# Patient Record
Sex: Female | Born: 1945 | Race: White | Hispanic: No | Smoking: Former smoker
Health system: Southern US, Community
[De-identification: ages and names within clinical notes are randomized; demographics above are authoritative.]

## PROBLEM LIST (undated history)

## (undated) DIAGNOSIS — I2510 Atherosclerotic heart disease of native coronary artery without angina pectoris: Secondary | ICD-10-CM

## (undated) DIAGNOSIS — J432 Centrilobular emphysema: Secondary | ICD-10-CM

## (undated) DIAGNOSIS — K760 Fatty (change of) liver, not elsewhere classified: Secondary | ICD-10-CM

## (undated) DIAGNOSIS — Z9889 Other specified postprocedural states: Secondary | ICD-10-CM

## (undated) DIAGNOSIS — K219 Gastro-esophageal reflux disease without esophagitis: Secondary | ICD-10-CM

## (undated) DIAGNOSIS — M359 Systemic involvement of connective tissue, unspecified: Secondary | ICD-10-CM

## (undated) DIAGNOSIS — I779 Disorder of arteries and arterioles, unspecified: Secondary | ICD-10-CM

## (undated) DIAGNOSIS — T7840XA Allergy, unspecified, initial encounter: Secondary | ICD-10-CM

## (undated) DIAGNOSIS — Z87891 Personal history of nicotine dependence: Principal | ICD-10-CM

## (undated) DIAGNOSIS — I4891 Unspecified atrial fibrillation: Secondary | ICD-10-CM

## (undated) DIAGNOSIS — R030 Elevated blood-pressure reading, without diagnosis of hypertension: Secondary | ICD-10-CM

## (undated) DIAGNOSIS — E785 Hyperlipidemia, unspecified: Secondary | ICD-10-CM

## (undated) DIAGNOSIS — I739 Peripheral vascular disease, unspecified: Secondary | ICD-10-CM

## (undated) DIAGNOSIS — I499 Cardiac arrhythmia, unspecified: Secondary | ICD-10-CM

## (undated) DIAGNOSIS — R112 Nausea with vomiting, unspecified: Secondary | ICD-10-CM

## (undated) DIAGNOSIS — C44311 Basal cell carcinoma of skin of nose: Secondary | ICD-10-CM

## (undated) DIAGNOSIS — J449 Chronic obstructive pulmonary disease, unspecified: Secondary | ICD-10-CM

## (undated) HISTORY — DX: Personal history of nicotine dependence: Z87.891

## (undated) HISTORY — DX: Fatty (change of) liver, not elsewhere classified: K76.0

## (undated) HISTORY — DX: Centrilobular emphysema: J43.2

## (undated) HISTORY — DX: Peripheral vascular disease, unspecified: I73.9

## (undated) HISTORY — DX: Elevated blood-pressure reading, without diagnosis of hypertension: R03.0

## (undated) HISTORY — DX: Disorder of arteries and arterioles, unspecified: I77.9

## (undated) HISTORY — DX: Atherosclerotic heart disease of native coronary artery without angina pectoris: I25.10

## (undated) HISTORY — DX: Allergy, unspecified, initial encounter: T78.40XA

## (undated) HISTORY — PX: TUBAL LIGATION: SHX77

## (undated) HISTORY — DX: Hyperlipidemia, unspecified: E78.5

## (undated) HISTORY — PX: TONSILLECTOMY: SUR1361

## (undated) HISTORY — DX: Unspecified atrial fibrillation: I48.91

---
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
## 2004-07-26 ENCOUNTER — Emergency Department: Payer: Self-pay | Admitting: General Practice

## 2006-09-12 ENCOUNTER — Emergency Department: Payer: Self-pay | Admitting: Emergency Medicine

## 2006-10-21 ENCOUNTER — Ambulatory Visit: Payer: Self-pay | Admitting: Specialist

## 2009-07-08 ENCOUNTER — Emergency Department: Payer: Self-pay | Admitting: Emergency Medicine

## 2011-03-04 ENCOUNTER — Ambulatory Visit: Payer: Self-pay | Admitting: Internal Medicine

## 2011-04-16 ENCOUNTER — Emergency Department: Payer: Self-pay | Admitting: Emergency Medicine

## 2011-08-02 ENCOUNTER — Emergency Department: Payer: Self-pay | Admitting: Emergency Medicine

## 2011-09-17 ENCOUNTER — Ambulatory Visit: Payer: Self-pay | Admitting: Vascular Surgery

## 2012-05-07 ENCOUNTER — Ambulatory Visit: Payer: Self-pay | Admitting: Family Medicine

## 2012-06-23 ENCOUNTER — Ambulatory Visit: Payer: Self-pay | Admitting: Gastroenterology

## 2012-08-13 DIAGNOSIS — Z85828 Personal history of other malignant neoplasm of skin: Secondary | ICD-10-CM | POA: Insufficient documentation

## 2012-09-24 ENCOUNTER — Other Ambulatory Visit: Payer: Self-pay | Admitting: Physician Assistant

## 2012-09-24 ENCOUNTER — Ambulatory Visit: Payer: Self-pay | Admitting: Vascular Surgery

## 2012-09-24 LAB — HEPATIC FUNCTION PANEL A (ARMC)
Alkaline Phosphatase: 215 U/L — ABNORMAL HIGH (ref 50–136)
Bilirubin,Total: 0.5 mg/dL (ref 0.2–1.0)
SGOT(AST): 21 U/L (ref 15–37)
Total Protein: 7.2 g/dL (ref 6.4–8.2)

## 2013-06-23 ENCOUNTER — Emergency Department: Payer: Self-pay | Admitting: Emergency Medicine

## 2013-06-24 LAB — CBC
HCT: 44.1 % (ref 35.0–47.0)
HGB: 14.6 g/dL (ref 12.0–16.0)
MCH: 29.1 pg (ref 26.0–34.0)
MCHC: 33 g/dL (ref 32.0–36.0)
MCV: 88 fL (ref 80–100)
Platelet: 253 10*3/uL (ref 150–440)
RBC: 5.01 10*6/uL (ref 3.80–5.20)
RDW: 13.1 % (ref 11.5–14.5)
WBC: 10.5 10*3/uL (ref 3.6–11.0)

## 2013-06-24 LAB — COMPREHENSIVE METABOLIC PANEL
ALK PHOS: 198 U/L — AB
ALT: 32 U/L (ref 12–78)
AST: 22 U/L (ref 15–37)
Albumin: 4 g/dL (ref 3.4–5.0)
Anion Gap: 8 (ref 7–16)
BUN: 15 mg/dL (ref 7–18)
Bilirubin,Total: 0.5 mg/dL (ref 0.2–1.0)
CHLORIDE: 105 mmol/L (ref 98–107)
CO2: 24 mmol/L (ref 21–32)
CREATININE: 0.87 mg/dL (ref 0.60–1.30)
Calcium, Total: 9.8 mg/dL (ref 8.5–10.1)
EGFR (Non-African Amer.): >60
GLUCOSE: 142 mg/dL — AB (ref 65–99)
OSMOLALITY: 277 (ref 275–301)
Potassium: 4.2 mmol/L (ref 3.5–5.1)
SODIUM: 137 mmol/L (ref 136–145)
TOTAL PROTEIN: 8.4 g/dL — AB (ref 6.4–8.2)

## 2014-02-20 ENCOUNTER — Emergency Department: Payer: Self-pay | Admitting: Emergency Medicine

## 2014-02-20 LAB — CBC
HCT: 43 % (ref 35.0–47.0)
HGB: 14.4 g/dL (ref 12.0–16.0)
MCH: 29.6 pg (ref 26.0–34.0)
MCHC: 33.4 g/dL (ref 32.0–36.0)
MCV: 89 fL (ref 80–100)
Platelet: 218 10*3/uL (ref 150–440)
RBC: 4.85 10*6/uL (ref 3.80–5.20)
RDW: 13.3 % (ref 11.5–14.5)
WBC: 7 10*3/uL (ref 3.6–11.0)

## 2014-02-20 LAB — URINALYSIS, COMPLETE
Bacteria: NONE SEEN
Bilirubin,UR: NEGATIVE
Blood: NEGATIVE
Glucose,UR: NEGATIVE mg/dL (ref 0–75)
KETONE: NEGATIVE
Leukocyte Esterase: NEGATIVE
Nitrite: NEGATIVE
PROTEIN: NEGATIVE
Ph: 6 (ref 4.5–8.0)
Specific Gravity: 1.004 (ref 1.003–1.030)
Squamous Epithelial: <1

## 2014-02-20 LAB — PROTIME-INR
INR: 1
PROTHROMBIN TIME: 13 s (ref 11.5–14.7)

## 2014-02-20 LAB — BASIC METABOLIC PANEL
Anion Gap: 4 — ABNORMAL LOW (ref 7–16)
BUN: 11 mg/dL (ref 7–18)
CHLORIDE: 106 mmol/L (ref 98–107)
CREATININE: 0.95 mg/dL (ref 0.60–1.30)
Calcium, Total: 9.2 mg/dL (ref 8.5–10.1)
Co2: 31 mmol/L (ref 21–32)
EGFR (African American): >60
EGFR (Non-African Amer.): >60
Glucose: 92 mg/dL (ref 65–99)
Osmolality: 280 (ref 275–301)
POTASSIUM: 4 mmol/L (ref 3.5–5.1)
SODIUM: 141 mmol/L (ref 136–145)

## 2014-02-20 LAB — LIPASE, BLOOD: Lipase: 122 U/L (ref 73–393)

## 2014-06-30 ENCOUNTER — Other Ambulatory Visit: Payer: Self-pay | Admitting: Vascular Surgery

## 2014-06-30 DIAGNOSIS — I6523 Occlusion and stenosis of bilateral carotid arteries: Secondary | ICD-10-CM

## 2014-07-08 ENCOUNTER — Ambulatory Visit
Admission: RE | Admit: 2014-07-08 | Discharge: 2014-07-08 | Disposition: A | Discharge status: Home / Self Care | Payer: Medicare Other | Source: Ambulatory Visit | Attending: Vascular Surgery | Admitting: Vascular Surgery

## 2014-07-08 DIAGNOSIS — I6522 Occlusion and stenosis of left carotid artery: Secondary | ICD-10-CM | POA: Insufficient documentation

## 2014-07-08 DIAGNOSIS — I6523 Occlusion and stenosis of bilateral carotid arteries: Secondary | ICD-10-CM

## 2014-07-08 HISTORY — DX: Systemic involvement of connective tissue, unspecified: M35.9

## 2014-07-08 MED ORDER — IOHEXOL 350 MG/ML SOLN
80.0000 mL | Freq: Once | INTRAVENOUS | Status: AC | PRN
  Administered 2014-07-08: 100 mL via INTRAVENOUS

## 2014-07-11 NOTE — Progress Notes (Signed)
Diagnosis is carotid stenosis.  This is a response from a coding query

## 2014-07-20 ENCOUNTER — Encounter: Payer: Self-pay | Admitting: *Deleted

## 2014-07-20 ENCOUNTER — Encounter
Admission: RE | Disposition: A | Discharge status: Home / Self Care | Payer: Self-pay | Source: Ambulatory Visit | Attending: Vascular Surgery

## 2014-07-20 ENCOUNTER — Ambulatory Visit
Admission: RE | Admit: 2014-07-20 | Discharge: 2014-07-20 | Disposition: A | Discharge status: Home / Self Care | Payer: Medicare Other | Source: Ambulatory Visit | Attending: Vascular Surgery | Admitting: Vascular Surgery

## 2014-07-20 DIAGNOSIS — Z79899 Other long term (current) drug therapy: Secondary | ICD-10-CM | POA: Insufficient documentation

## 2014-07-20 DIAGNOSIS — I6529 Occlusion and stenosis of unspecified carotid artery: Secondary | ICD-10-CM | POA: Diagnosis present

## 2014-07-20 DIAGNOSIS — Z7982 Long term (current) use of aspirin: Secondary | ICD-10-CM | POA: Diagnosis not present

## 2014-07-20 DIAGNOSIS — M79609 Pain in unspecified limb: Secondary | ICD-10-CM | POA: Insufficient documentation

## 2014-07-20 DIAGNOSIS — F17200 Nicotine dependence, unspecified, uncomplicated: Secondary | ICD-10-CM | POA: Diagnosis not present

## 2014-07-20 DIAGNOSIS — J449 Chronic obstructive pulmonary disease, unspecified: Secondary | ICD-10-CM | POA: Diagnosis not present

## 2014-07-20 DIAGNOSIS — E785 Hyperlipidemia, unspecified: Secondary | ICD-10-CM | POA: Diagnosis not present

## 2014-07-20 DIAGNOSIS — I10 Essential (primary) hypertension: Secondary | ICD-10-CM | POA: Insufficient documentation

## 2014-07-20 HISTORY — PX: PERIPHERAL VASCULAR CATHETERIZATION: SHX172C

## 2014-07-20 HISTORY — DX: Chronic obstructive pulmonary disease, unspecified: J44.9

## 2014-07-20 LAB — CREATININE, SERUM: Creatinine, Ser: 0.87 mg/dL (ref 0.44–1.00)

## 2014-07-20 LAB — BUN: BUN: 14 mg/dL (ref 6–20)

## 2014-07-20 SURGERY — CAROTID ANGIOGRAPHY
Anesthesia: Moderate Sedation

## 2014-07-20 MED ORDER — MIDAZOLAM HCL 5 MG/5ML IJ SOLN
INTRAMUSCULAR | Status: AC
  Filled 2014-07-20: qty 5

## 2014-07-20 MED ORDER — HEPARIN SODIUM (PORCINE) 1000 UNIT/ML IJ SOLN
INTRAMUSCULAR | Status: DC | PRN
  Administered 2014-07-20: 3000 [IU] via INTRAVENOUS

## 2014-07-20 MED ORDER — FENTANYL CITRATE (PF) 100 MCG/2ML IJ SOLN
INTRAMUSCULAR | Status: DC | PRN
  Administered 2014-07-20 (×2): 50 ug via INTRAVENOUS

## 2014-07-20 MED ORDER — HEPARIN SODIUM (PORCINE) 1000 UNIT/ML IJ SOLN
INTRAMUSCULAR | Status: AC
  Filled 2014-07-20: qty 1

## 2014-07-20 MED ORDER — FENTANYL CITRATE (PF) 100 MCG/2ML IJ SOLN
INTRAMUSCULAR | Status: AC
  Filled 2014-07-20: qty 2

## 2014-07-20 MED ORDER — LIDOCAINE-EPINEPHRINE (PF) 1 %-1:200000 IJ SOLN
INTRAMUSCULAR | Status: AC
  Filled 2014-07-20: qty 30

## 2014-07-20 MED ORDER — IOHEXOL 350 MG/ML SOLN
INTRAVENOUS | Status: DC | PRN
Start: 2014-07-20 — End: 2014-07-20
  Administered 2014-07-20: 55 mL via INTRA_ARTERIAL

## 2014-07-20 MED ORDER — MIDAZOLAM HCL 2 MG/2ML IJ SOLN
INTRAMUSCULAR | Status: DC | PRN
  Administered 2014-07-20: 2 mg via INTRAVENOUS
  Administered 2014-07-20: 1 mg via INTRAVENOUS

## 2014-07-20 MED ORDER — ONDANSETRON HCL 4 MG/2ML IJ SOLN
INTRAMUSCULAR | Status: AC
  Administered 2014-07-20: 4 mg
  Filled 2014-07-20: qty 2

## 2014-07-20 MED ORDER — SODIUM CHLORIDE 0.9 % IV SOLN
INTRAVENOUS | Status: DC
  Administered 2014-07-20: 75 mL/h via INTRAVENOUS

## 2014-07-20 MED ORDER — HEPARIN (PORCINE) IN NACL 2-0.9 UNIT/ML-% IJ SOLN
INTRAMUSCULAR | Status: AC
  Filled 2014-07-20: qty 500

## 2014-07-20 MED ORDER — CEFAZOLIN SODIUM 1-5 GM-% IV SOLN
1.0000 g | Freq: Once | INTRAVENOUS | Status: AC
  Administered 2014-07-20: 1 g via INTRAVENOUS

## 2014-07-20 MED ORDER — LIDOCAINE-EPINEPHRINE (PF) 1 %-1:200000 IJ SOLN
INTRAMUSCULAR | Status: DC | PRN
  Administered 2014-07-20: 10 mL via INTRADERMAL

## 2014-07-20 SURGICAL SUPPLY — 11 items
CATH ANGIO PIGTAIL 5FR 100 (CATHETERS) ×1 IMPLANT
CATH BEACON 5 .035 100 JB2 TIP (CATHETERS) ×3 IMPLANT
CATH H1 5.0X100 SLIP (CATHETERS) ×2
CATH PIG 5.0X100 (CATHETERS) ×3
CATH SLIP .038X100 H1 (CATHETERS) ×1 IMPLANT
DEVICE STARCLOSE SE CLOSURE (Vascular Products) ×3 IMPLANT
DEVICE TORQUE (MISCELLANEOUS) ×3 IMPLANT
GLIDEWIRE STIFF .35X180X3 HYDR (WIRE) ×3 IMPLANT
PACK ANGIOGRAPHY (CUSTOM PROCEDURE TRAY) ×3 IMPLANT
SHEATH BRITE TIP 5FRX11 (SHEATH) ×3 IMPLANT
WIRE J 3MM .035X145CM (WIRE) ×3 IMPLANT

## 2014-07-20 NOTE — H&P (Signed)
[facility] History & Physical Update  The patient was interviewed and re-examined.  The patient's previous History and Physical has been reviewed and is unchanged.  There is no change in the plan of care.  DEW,JASON, MD  07/20/2014, 12:09 PM

## 2014-07-20 NOTE — Discharge Instructions (Signed)

## 2014-07-20 NOTE — CV Procedure (Signed)
[facility]  Percutaneous Study/Intervention Procedural Note   Date of Surgery: 07/20/2014 Surgeon(s):Noelly Lasseigne  Assistants: Pre-operative Diagnosis: Carotid artery stenosis with differing findings between duplex and CT scan. Post-operative diagnosis:  Same  Procedure(s) Performed:  1.  Ultrasound guidance for vascular access right femoral artery  2.  Catheter placement into right common carotid artery and into left common carotid artery from right femoral approach  3.  Thoracic aortogram and cervical and cerebral carotid angiograms bilaterally  4.  StarClose closure device right femoral artery   EBL: Minimal  Indications:  Patient is a 69 year old white female with carotid artery stenosis. The patient had highly disparate findings between duplex which suggested high-grade carotid artery stenosis and CT angiogram which suggested by the official radiology report minimal carotid artery stenosis. My independent reading of the CT angiogram suggested at least moderate carotid artery stenosis on the left. Given the differing findings and the differing approaches for therapy that would be given with each finding, carotid angiogram is performed for further evaluation.  Procedure:  The patient was identified and appropriate procedural time out was performed.  The patient was then placed supine on the table and prepped and draped in the usual sterile fashion.  Ultrasound was used to evaluate the right common femoral artery.  It was patent .  A digital ultrasound image was acquired.  A Seldinger needle was used to access the right common femoral artery under direct ultrasound guidance and a permanent image was performed.  A 0.035 J wire was advanced without resistance and a 5Fr sheath was placed.  The patient was then given 3000 units of intravenous heparin. Pigtail catheter was placed into the ascending aorta and a thoracic aortogram was performed. This demonstrated normal origins of  the great vessels without proximal stenoses. This was a type II arch. A headhunter catheter was then used to selectively cannulate the innominate artery and advanced into the right common carotid artery. Cervical and cerebral carotid angiography was then performed. This demonstrated normal intracranial filling with brisk cross filling right to left through the anterior cerebral artery. The cervical carotid artery had minimal plaque and less than 10% stenosis. I then turned my attention back to the thoracic aorta. I exchanged for a JB2 catheter and selectively cannulated the left common carotid artery. I advanced the catheter into the mid left common carotid artery and cervical and cerebral angiography were performed. The intracranial filling demonstrated sluggish anterior cerebral filling with normal middle cerebral filling. The cervical carotid artery demonstrated tandem stenoses in the distal common carotid artery and then the proximal internal carotid artery. With vessel analysis the area of stenosis measured out at 69%. By strict this NASCET criteria, the degree of stenosis measured in the 60-65% range. At this point, I elected to terminate the procedure. Diagnostic catheter was removed. Oblique arteriogram was performed to the right femoral sheath. StarClose closure device was then deployed in the usual fashion with excellent hemostatic result. The patient was taken to the recovery room in stable condition having tolerated the procedure well.  Findings:   Aortogram:  Normal origins of the great vessels, no proximal stenoses in the great vessels, type II aortic arch  Right carotid artery: No significant carotid artery stenosis with brisk intracranial filling  Left carotid artery:  Moderate to almost high-grade left carotid artery stenosis measuring between 60 and 65% strictest NASCET criteria and 69% by computer vessel analysis, left anterior cerebral artery with sluggish filling, middle cerebral artery  with normal filling  Disposition: Patient was taken to the recovery room in stable condition having tolerated the procedure well.  [name], MD 07/20/2014 1:44 PM

## 2014-07-22 ENCOUNTER — Encounter: Payer: Self-pay | Admitting: Vascular Surgery

## 2014-08-15 ENCOUNTER — Encounter
Admission: RE | Admit: 2014-08-15 | Discharge: 2014-08-15 | Disposition: A | Discharge status: Home / Self Care | Payer: Medicare Other | Source: Ambulatory Visit | Attending: Vascular Surgery | Admitting: Vascular Surgery

## 2014-08-15 DIAGNOSIS — I10 Essential (primary) hypertension: Secondary | ICD-10-CM | POA: Diagnosis not present

## 2014-08-15 DIAGNOSIS — Z833 Family history of diabetes mellitus: Secondary | ICD-10-CM | POA: Diagnosis not present

## 2014-08-15 DIAGNOSIS — I6522 Occlusion and stenosis of left carotid artery: Secondary | ICD-10-CM | POA: Insufficient documentation

## 2014-08-15 DIAGNOSIS — J449 Chronic obstructive pulmonary disease, unspecified: Secondary | ICD-10-CM | POA: Insufficient documentation

## 2014-08-15 DIAGNOSIS — Z808 Family history of malignant neoplasm of other organs or systems: Secondary | ICD-10-CM | POA: Diagnosis not present

## 2014-08-15 DIAGNOSIS — Z8249 Family history of ischemic heart disease and other diseases of the circulatory system: Secondary | ICD-10-CM | POA: Diagnosis not present

## 2014-08-15 DIAGNOSIS — Z01812 Encounter for preprocedural laboratory examination: Secondary | ICD-10-CM | POA: Insufficient documentation

## 2014-08-15 DIAGNOSIS — K219 Gastro-esophageal reflux disease without esophagitis: Secondary | ICD-10-CM | POA: Diagnosis not present

## 2014-08-15 HISTORY — DX: Other specified postprocedural states: Z98.890

## 2014-08-15 HISTORY — DX: Gastro-esophageal reflux disease without esophagitis: K21.9

## 2014-08-15 HISTORY — DX: Nausea with vomiting, unspecified: R11.2

## 2014-08-15 LAB — BASIC METABOLIC PANEL
Anion gap: 11 (ref 5–15)
BUN: 14 mg/dL (ref 6–20)
CO2: 23 mmol/L (ref 22–32)
CREATININE: 0.81 mg/dL (ref 0.44–1.00)
Calcium: 9.3 mg/dL (ref 8.9–10.3)
Chloride: 105 mmol/L (ref 101–111)
Glucose, Bld: 102 mg/dL — ABNORMAL HIGH (ref 65–99)
POTASSIUM: 4.1 mmol/L (ref 3.5–5.1)
Sodium: 139 mmol/L (ref 135–145)

## 2014-08-15 LAB — CBC
HEMATOCRIT: 40.2 % (ref 35.0–47.0)
HEMOGLOBIN: 13.8 g/dL (ref 12.0–16.0)
MCH: 29.6 pg (ref 26.0–34.0)
MCHC: 34.3 g/dL (ref 32.0–36.0)
MCV: 86.3 fL (ref 80.0–100.0)
Platelets: 203 10*3/uL (ref 150–440)
RBC: 4.65 MIL/uL (ref 3.80–5.20)
RDW: 13.2 % (ref 11.5–14.5)
WBC: 8 10*3/uL (ref 3.6–11.0)

## 2014-08-15 LAB — TYPE AND SCREEN
ABO/RH(D): O POS
Antibody Screen: NEGATIVE

## 2014-08-15 LAB — ABO/RH: ABO/RH(D): O POS

## 2014-08-15 NOTE — Patient Instructions (Addendum)
  Your procedure is scheduled on: Thursday 7/7 Report to Day Surgery. Medical Mall Entrance To find out your arrival time please call [phone] between 1PM - 3PM on Wednesday 7/6.  Remember: Instructions that are not followed completely may result in serious medical risk, up to and including death, or upon the discretion of your surgeon and anesthesiologist your surgery may need to be rescheduled.    __X__ 1. Do not eat food or drink liquids after midnight. No gum chewing or hard candies.     __X__ 2. No Alcohol for 24 hours before or after surgery.   ____ 3. Bring all medications with you on the day of surgery if instructed.    __X__ 4. Notify your doctor if there is any change in your medical condition     (cold, fever, infections).     Do not wear jewelry, make-up, hairpins, clips or nail polish.  Do not wear lotions, powders, or perfumes.   Do not shave 48 hours prior to surgery. Men may shave face and neck.  Do not bring valuables to the hospital.    [facility] is not responsible for any belongings or valuables.               Contacts, dentures or bridgework may not be worn into surgery.  Leave your suitcase in the car. After surgery it may be brought to your room.  For patients admitted to the hospital, discharge time is determined by your                treatment team.   Patients discharged the day of surgery will not be allowed to drive home.   Please read over the following fact sheets that you were given:   Surgical Site Infection Prevention   ____ Take these medicines the morning of surgery with A SIP OF WATER:    1.   2.   3.   4.  5.  6.  ____ Fleet Enema (as directed)   __X__ Use CHG Soap as directed  __X__ Use inhalers on the day of surgery  ____ Stop metformin 2 days prior to surgery    ____ Take 1/2 of usual insulin dose the night before surgery and none on the morning of surgery.   ____ Stop Coumadin/Plavix/aspirin on   ____ Stop  Anti-inflammatories on    __X__ Stop supplements until after surgery.  STOP FISH OIL 7 DAYS BEFORE SURGERY  ____ Bring C-Pap to the hospital.

## 2014-08-25 ENCOUNTER — Ambulatory Visit: Payer: Medicare Other | Admitting: Anesthesiology

## 2014-08-25 ENCOUNTER — Encounter
Admission: AD | Disposition: A | Discharge status: Home / Self Care | Payer: Self-pay | Source: Ambulatory Visit | Attending: Vascular Surgery

## 2014-08-25 ENCOUNTER — Inpatient Hospital Stay
Admission: AD | Admit: 2014-08-25 | Discharge: 2014-08-26 | DRG: 039 | Disposition: A | Discharge status: Home / Self Care | Payer: Medicare Other | Source: Ambulatory Visit | Attending: Vascular Surgery | Admitting: Vascular Surgery

## 2014-08-25 ENCOUNTER — Encounter: Payer: Self-pay | Admitting: *Deleted

## 2014-08-25 DIAGNOSIS — I10 Essential (primary) hypertension: Secondary | ICD-10-CM | POA: Diagnosis present

## 2014-08-25 DIAGNOSIS — I6529 Occlusion and stenosis of unspecified carotid artery: Secondary | ICD-10-CM | POA: Diagnosis present

## 2014-08-25 DIAGNOSIS — I252 Old myocardial infarction: Secondary | ICD-10-CM | POA: Diagnosis not present

## 2014-08-25 DIAGNOSIS — K219 Gastro-esophageal reflux disease without esophagitis: Secondary | ICD-10-CM | POA: Diagnosis not present

## 2014-08-25 DIAGNOSIS — I6522 Occlusion and stenosis of left carotid artery: Principal | ICD-10-CM | POA: Diagnosis present

## 2014-08-25 DIAGNOSIS — J449 Chronic obstructive pulmonary disease, unspecified: Secondary | ICD-10-CM | POA: Diagnosis present

## 2014-08-25 DIAGNOSIS — F17210 Nicotine dependence, cigarettes, uncomplicated: Secondary | ICD-10-CM | POA: Diagnosis present

## 2014-08-25 DIAGNOSIS — I6521 Occlusion and stenosis of right carotid artery: Secondary | ICD-10-CM | POA: Diagnosis not present

## 2014-08-25 HISTORY — PX: ENDARTERECTOMY: SHX5162

## 2014-08-25 LAB — MRSA PCR SCREENING: MRSA by PCR: NEGATIVE

## 2014-08-25 SURGERY — ENDARTERECTOMY, CAROTID
Anesthesia: General | Laterality: Left

## 2014-08-25 MED ORDER — FAMOTIDINE 20 MG PO TABS
20.0000 mg | ORAL_TABLET | Freq: Once | ORAL | Status: AC
  Administered 2014-08-25: 20 mg via ORAL

## 2014-08-25 MED ORDER — ONDANSETRON HCL 4 MG/2ML IJ SOLN
4.0000 mg | Freq: Four times a day (QID) | INTRAMUSCULAR | Status: DC | PRN
  Administered 2014-08-25: 4 mg via INTRAVENOUS
  Filled 2014-08-25 (×2): qty 2

## 2014-08-25 MED ORDER — OXYCODONE HCL 5 MG PO TABS: 5.0000 mg | ORAL_TABLET | Freq: Once | ORAL | Status: DC | PRN

## 2014-08-25 MED ORDER — DEXAMETHASONE SODIUM PHOSPHATE 10 MG/ML IJ SOLN
INTRAMUSCULAR | Status: DC | PRN
  Administered 2014-08-25: 5 mg via INTRAVENOUS

## 2014-08-25 MED ORDER — SODIUM CHLORIDE 0.9 % IV SOLN
INTRAVENOUS | Status: DC
  Administered 2014-08-25: 18:00:00 via INTRAVENOUS

## 2014-08-25 MED ORDER — ACETAMINOPHEN 325 MG PO TABS: 325.0000 mg | ORAL_TABLET | ORAL | Status: DC | PRN

## 2014-08-25 MED ORDER — EVICEL 2 ML EX KIT
PACK | CUTANEOUS | Status: DC | PRN
  Administered 2014-08-25: 2 mL

## 2014-08-25 MED ORDER — PHENYLEPHRINE HCL 10 MG/ML IJ SOLN
INTRAMUSCULAR | Status: DC | PRN
  Administered 2014-08-25: 100 ug via INTRAVENOUS

## 2014-08-25 MED ORDER — PHENOL 1.4 % MT LIQD: 1.0000 | OROMUCOSAL | Status: DC | PRN

## 2014-08-25 MED ORDER — ONDANSETRON HCL 4 MG/2ML IJ SOLN
INTRAMUSCULAR | Status: AC
  Filled 2014-08-25: qty 2

## 2014-08-25 MED ORDER — HEPARIN SODIUM (PORCINE) 1000 UNIT/ML IJ SOLN
INTRAMUSCULAR | Status: AC
  Filled 2014-08-25: qty 1

## 2014-08-25 MED ORDER — CEFAZOLIN SODIUM 1-5 GM-% IV SOLN
INTRAVENOUS | Status: AC
  Filled 2014-08-25: qty 50

## 2014-08-25 MED ORDER — CETYLPYRIDINIUM CHLORIDE 0.05 % MT LIQD: 7.0000 mL | Freq: Two times a day (BID) | OROMUCOSAL | Status: DC

## 2014-08-25 MED ORDER — FAMOTIDINE 20 MG PO TABS
ORAL_TABLET | ORAL | Status: AC
  Administered 2014-08-25: 20 mg via ORAL
  Filled 2014-08-25: qty 1

## 2014-08-25 MED ORDER — MIDAZOLAM HCL 5 MG/5ML IJ SOLN
INTRAMUSCULAR | Status: DC | PRN
  Administered 2014-08-25: 2 mg via INTRAVENOUS

## 2014-08-25 MED ORDER — LABETALOL HCL 5 MG/ML IV SOLN
INTRAVENOUS | Status: DC | PRN
  Administered 2014-08-25 (×2): 5 mg via INTRAVENOUS
  Administered 2014-08-25: 2.5 mg via INTRAVENOUS

## 2014-08-25 MED ORDER — OXYCODONE-ACETAMINOPHEN 5-325 MG PO TABS
1.0000 | ORAL_TABLET | ORAL | Status: DC | PRN
  Administered 2014-08-25: 1 via ORAL
  Filled 2014-08-25: qty 1

## 2014-08-25 MED ORDER — ALUM & MAG HYDROXIDE-SIMETH 200-200-20 MG/5ML PO SUSP: 15.0000 mL | ORAL | Status: DC | PRN

## 2014-08-25 MED ORDER — NITROGLYCERIN IN D5W 200-5 MCG/ML-% IV SOLN
INTRAVENOUS | Status: AC
  Filled 2014-08-25: qty 250

## 2014-08-25 MED ORDER — LIDOCAINE HCL (PF) 1 % IJ SOLN
INTRAMUSCULAR | Status: AC
  Filled 2014-08-25: qty 30

## 2014-08-25 MED ORDER — SODIUM CHLORIDE 0.9 % IV SOLN
INTRAVENOUS | Status: DC | PRN
  Administered 2014-08-25: 1 mL via INTRAMUSCULAR

## 2014-08-25 MED ORDER — ACETAMINOPHEN 325 MG RE SUPP: 325.0000 mg | RECTAL | Status: DC | PRN

## 2014-08-25 MED ORDER — CLOPIDOGREL BISULFATE 75 MG PO TABS
75.0000 mg | ORAL_TABLET | Freq: Every day | ORAL | Status: DC
  Administered 2014-08-26: 75 mg via ORAL
  Filled 2014-08-25: qty 1

## 2014-08-25 MED ORDER — PROPOFOL 10 MG/ML IV BOLUS
INTRAVENOUS | Status: DC | PRN
  Administered 2014-08-25: 120 mg via INTRAVENOUS

## 2014-08-25 MED ORDER — OXYCODONE HCL 5 MG/5ML PO SOLN: 5.0000 mg | Freq: Once | ORAL | Status: DC | PRN

## 2014-08-25 MED ORDER — LABETALOL HCL 5 MG/ML IV SOLN
10.0000 mg | INTRAVENOUS | Status: DC | PRN
  Filled 2014-08-25: qty 4

## 2014-08-25 MED ORDER — FENTANYL CITRATE (PF) 100 MCG/2ML IJ SOLN
INTRAMUSCULAR | Status: DC | PRN
Start: 2014-08-25 — End: 2014-08-25
  Administered 2014-08-25: 100 ug via INTRAVENOUS

## 2014-08-25 MED ORDER — ASPIRIN EC 81 MG PO TBEC
81.0000 mg | DELAYED_RELEASE_TABLET | Freq: Every day | ORAL | Status: DC
  Administered 2014-08-25 – 2014-08-26 (×2): 81 mg via ORAL
  Filled 2014-08-25 (×2): qty 1

## 2014-08-25 MED ORDER — CEFAZOLIN SODIUM 1 G IJ SOLR
INTRAMUSCULAR | Status: DC | PRN
  Administered 2014-08-25: 10 mL

## 2014-08-25 MED ORDER — LACTATED RINGERS IV SOLN
INTRAVENOUS | Status: DC
  Administered 2014-08-25: 11:00:00 via INTRAVENOUS

## 2014-08-25 MED ORDER — FAMOTIDINE IN NACL 20-0.9 MG/50ML-% IV SOLN
20.0000 mg | Freq: Two times a day (BID) | INTRAVENOUS | Status: DC
  Administered 2014-08-25 – 2014-08-26 (×2): 20 mg via INTRAVENOUS
  Filled 2014-08-25 (×4): qty 50

## 2014-08-25 MED ORDER — GUAIFENESIN-DM 100-10 MG/5ML PO SYRP
15.0000 mL | ORAL_SOLUTION | ORAL | Status: DC | PRN
  Administered 2014-08-25: 15 mL via ORAL
  Filled 2014-08-25: qty 10
  Filled 2014-08-25: qty 5

## 2014-08-25 MED ORDER — ONDANSETRON HCL 4 MG/2ML IJ SOLN
INTRAMUSCULAR | Status: DC | PRN
Start: 2014-08-25 — End: 2014-08-25
  Administered 2014-08-25: 4 mg via INTRAVENOUS

## 2014-08-25 MED ORDER — FENTANYL CITRATE (PF) 100 MCG/2ML IJ SOLN: 25.0000 ug | INTRAMUSCULAR | Status: DC | PRN

## 2014-08-25 MED ORDER — MORPHINE SULFATE 2 MG/ML IJ SOLN: 2.0000 mg | INTRAMUSCULAR | Status: DC | PRN

## 2014-08-25 MED ORDER — HEPARIN SODIUM (PORCINE) 1000 UNIT/ML IJ SOLN
INTRAMUSCULAR | Status: DC | PRN
  Administered 2014-08-25: 5000 [IU] via INTRAVENOUS

## 2014-08-25 MED ORDER — CEFAZOLIN SODIUM 1-5 GM-% IV SOLN
1.0000 g | Freq: Once | INTRAVENOUS | Status: AC
  Administered 2014-08-25: 1 g via INTRAVENOUS

## 2014-08-25 MED ORDER — NITROGLYCERIN IN D5W 200-5 MCG/ML-% IV SOLN: 5.0000 ug/min | INTRAVENOUS | Status: DC

## 2014-08-25 MED ORDER — LIDOCAINE HCL (CARDIAC) 20 MG/ML IV SOLN
INTRAVENOUS | Status: DC | PRN
  Administered 2014-08-25: 60 mg via INTRAVENOUS

## 2014-08-25 MED ORDER — LACTATED RINGERS IV SOLN
INTRAVENOUS | Status: DC | PRN
  Administered 2014-08-25: 13:00:00 via INTRAVENOUS

## 2014-08-25 MED ORDER — DOCUSATE SODIUM 100 MG PO CAPS: 100.0000 mg | ORAL_CAPSULE | Freq: Every day | ORAL | Status: DC

## 2014-08-25 MED ORDER — DEXTROSE 5 % IV SOLN
1.5000 g | Freq: Two times a day (BID) | INTRAVENOUS | Status: AC
  Administered 2014-08-25 – 2014-08-26 (×2): 1.5 g via INTRAVENOUS
  Filled 2014-08-25 (×2): qty 1.5

## 2014-08-25 MED ORDER — ROCURONIUM BROMIDE 100 MG/10ML IV SOLN
INTRAVENOUS | Status: DC | PRN
  Administered 2014-08-25 (×2): 10 mg via INTRAVENOUS
  Administered 2014-08-25: 30 mg via INTRAVENOUS

## 2014-08-25 MED ORDER — MAGNESIUM SULFATE 2 GM/50ML IV SOLN
2.0000 g | Freq: Every day | INTRAVENOUS | Status: DC | PRN
  Filled 2014-08-25: qty 50

## 2014-08-25 MED ORDER — HYDRALAZINE HCL 20 MG/ML IJ SOLN: 5.0000 mg | INTRAMUSCULAR | Status: DC | PRN

## 2014-08-25 MED ORDER — METOPROLOL TARTRATE 1 MG/ML IV SOLN: 2.0000 mg | INTRAVENOUS | Status: DC | PRN

## 2014-08-25 MED ORDER — CEFAZOLIN SODIUM 1 G IJ SOLR
INTRAMUSCULAR | Status: AC
  Filled 2014-08-25: qty 10

## 2014-08-25 MED ORDER — POTASSIUM CHLORIDE CRYS ER 20 MEQ PO TBCR: 20.0000 meq | EXTENDED_RELEASE_TABLET | Freq: Every day | ORAL | Status: DC | PRN

## 2014-08-25 MED ORDER — NITROGLYCERIN 0.2 MG/ML ON CALL CATH LAB
INTRAVENOUS | Status: DC | PRN
  Administered 2014-08-25: 100 ug via INTRAVENOUS
  Administered 2014-08-25: 50 ug via INTRAVENOUS
  Administered 2014-08-25: 100 ug via INTRAVENOUS
  Administered 2014-08-25 (×3): 50 ug via INTRAVENOUS
  Administered 2014-08-25: 100 ug via INTRAVENOUS
  Administered 2014-08-25: 50 ug via INTRAVENOUS

## 2014-08-25 MED ORDER — LIDOCAINE HCL 1 % IJ SOLN
INTRAMUSCULAR | Status: DC | PRN
  Administered 2014-08-25: 10 mL

## 2014-08-25 SURGICAL SUPPLY — 60 items
BAG DECANTER STRL (MISCELLANEOUS) ×3 IMPLANT
BLADE SURG 15 STRL LF DISP TIS (BLADE) ×1 IMPLANT
BLADE SURG 15 STRL SS (BLADE) ×2
BLADE SURG SZ11 CARB STEEL (BLADE) ×3 IMPLANT
BOOT SUTURE AID YELLOW STND (SUTURE) ×3 IMPLANT
BRUSH SCRUB 4% CHG (MISCELLANEOUS) ×3 IMPLANT
CANISTER SUCT 1200ML W/VALVE (MISCELLANEOUS) ×3 IMPLANT
CATH TRAY 16F METER LATEX (MISCELLANEOUS) ×3 IMPLANT
DRAPE INCISE IOBAN 66X45 STRL (DRAPES) ×3 IMPLANT
DRAPE PED LAPAROTOMY (DRAPES) ×3 IMPLANT
DRAPE SHEET LG 3/4 BI-LAMINATE (DRAPES) IMPLANT
DRSG TEGADERM 4X4.75 (GAUZE/BANDAGES/DRESSINGS) IMPLANT
DRSG TELFA 3X8 NADH (GAUZE/BANDAGES/DRESSINGS) IMPLANT
DURAPREP 26ML APPLICATOR (WOUND CARE) ×3 IMPLANT
ELECT CAUTERY BLADE 6.4 (BLADE) ×3 IMPLANT
EVICEL 2ML SEALANT HUMAN (Miscellaneous) ×3 IMPLANT
GLOVE BIO SURGEON STRL SZ7 (GLOVE) ×12 IMPLANT
GOWN STRL REUS W/ TWL LRG LVL3 (GOWN DISPOSABLE) ×2 IMPLANT
GOWN STRL REUS W/ TWL XL LVL3 (GOWN DISPOSABLE) ×1 IMPLANT
GOWN STRL REUS W/TWL LRG LVL3 (GOWN DISPOSABLE) ×4
GOWN STRL REUS W/TWL XL LVL3 (GOWN DISPOSABLE) ×2
HEMOSTAT SURGICEL 2X3 (HEMOSTASIS) ×3 IMPLANT
IV NS 250ML (IV SOLUTION) ×2
IV NS 250ML BAXH (IV SOLUTION) ×1 IMPLANT
KIT RM TURNOVER STRD PROC AR (KITS) ×3 IMPLANT
LABEL OR SOLS (LABEL) ×3 IMPLANT
LIQUID BAND (GAUZE/BANDAGES/DRESSINGS) ×3 IMPLANT
LOOP RED MAXI  1X406MM (MISCELLANEOUS) ×4
LOOP VESSEL MAXI 1X406 RED (MISCELLANEOUS) ×2 IMPLANT
LOOP VESSEL MINI 0.8X406 BLUE (MISCELLANEOUS) ×1 IMPLANT
LOOPS BLUE MINI 0.8X406MM (MISCELLANEOUS) ×2
NDL SAFETY 25GX1.5 (NEEDLE) ×3 IMPLANT
NEEDLE FILTER BLUNT 18X 1/2SAF (NEEDLE) ×2
NEEDLE FILTER BLUNT 18X1 1/2 (NEEDLE) ×1 IMPLANT
NS IRRIG 1000ML POUR BTL (IV SOLUTION) ×3 IMPLANT
PACK BASIN MAJOR ARMC (MISCELLANEOUS) ×3 IMPLANT
PAD GROUND ADULT SPLIT (MISCELLANEOUS) ×3 IMPLANT
PATCH CAROTID ECM VASC 1X10 (Prosthesis & Implant Heart) ×3 IMPLANT
PENCIL ELECTRO HAND CTR (MISCELLANEOUS) IMPLANT
SHUNT CAROTID PRUITT F3 T3103A (SHUNT) ×3 IMPLANT
SHUNT CAROTID STR REINF 3.0X4. (MISCELLANEOUS) ×3 IMPLANT
SUT MNCRL 4-0 (SUTURE) ×2
SUT MNCRL 4-0 27XMFL (SUTURE) ×1
SUT PROLENE 6 0 BV (SUTURE) ×18 IMPLANT
SUT PROLENE 7 0 BV 1 (SUTURE) ×6 IMPLANT
SUT PROLENE BV 1 BLUE 7-0 30IN (SUTURE) IMPLANT
SUT SILK 2 0 (SUTURE) ×2
SUT SILK 2-0 18XBRD TIE 12 (SUTURE) ×1 IMPLANT
SUT SILK 3 0 (SUTURE) ×2
SUT SILK 3-0 18XBRD TIE 12 (SUTURE) ×1 IMPLANT
SUT SILK 4 0 (SUTURE) ×2
SUT SILK 4-0 18XBRD TIE 12 (SUTURE) ×1 IMPLANT
SUT VIC AB 3-0 SH 27 (SUTURE) ×4
SUT VIC AB 3-0 SH 27X BRD (SUTURE) ×2 IMPLANT
SUTURE MNCRL 4-0 27XMF (SUTURE) ×1 IMPLANT
SYR 20CC LL (SYRINGE) ×3 IMPLANT
SYRINGE 10CC LL (SYRINGE) ×6 IMPLANT
TOWEL OR 17X26 4PK STRL BLUE (TOWEL DISPOSABLE) ×3 IMPLANT
TUBING CONNECTING 10 (TUBING) IMPLANT
TUBING CONNECTING 10' (TUBING)

## 2014-08-25 NOTE — Progress Notes (Signed)
Patient resting quietly.  Still a little groggy. Wakes and follows commands. No focal deficits noted. Check labs in am Clear liquid diet OK to start

## 2014-08-25 NOTE — Transfer of Care (Signed)
Immediate Anesthesia Transfer of Care Note  Patient: Kristie Walters  Procedure(s) Performed: Procedure(s): ENDARTERECTOMY CAROTID (Left)  Patient Location: PACU  Anesthesia Type:General  Level of Consciousness: awake and patient cooperative  Airway & Oxygen Therapy: Patient Spontanous Breathing and Patient connected to face mask oxygen  Post-op Assessment: Report given to RN and Post -op Vital signs reviewed and stable  Post vital signs: Reviewed  Last Vitals:  Filed Vitals:   08/25/14 1042  BP: 125/77  Pulse: 75  Temp: 97.5  Resp: 16    Complications: No apparent anesthesia complications

## 2014-08-25 NOTE — H&P (Signed)
[facility] History & Physical Update  The patient was interviewed and re-examined.  The patient's previous History and Physical has been reviewed and is unchanged.  There is no change in the plan of care. We plan to proceed with the scheduled procedure.  Braeden Dolinski, MD  08/25/2014, 12:16 PM

## 2014-08-25 NOTE — Anesthesia Procedure Notes (Addendum)
Procedure Name: Intubation Date/Time: 08/25/2014 12:48 PM Performed by: Andria Frames Pre-anesthesia Checklist: Patient identified, Patient being monitored, Timeout performed, Emergency Drugs available and Suction available Patient Re-evaluated:Patient Re-evaluated prior to inductionOxygen Delivery Method: Circle system utilized Preoxygenation: Pre-oxygenation with 100% oxygen Intubation Type: IV induction Ventilation: Mask ventilation without difficulty Laryngoscope Size: Mac and 3 Grade View: Grade II Tube type: Oral Tube size: 7.0 mm Number of attempts: 1 Airway Equipment and Method: Stylet Placement Confirmation: ETT inserted through vocal cords under direct vision,  positive ETCO2 and breath sounds checked- equal and bilateral Secured at: 21 cm Tube secured with: Tape Dental Injury: Teeth and Oropharynx as per pre-operative assessment  Comments:   Arterial Line Placement: A time-out was completed verifying correct patient, procedure, site, positioning, and special equipment if applicable. Allen's test was performed to ensure adequate perfusion. The patient's Right wrist was prepped and draped in sterile fashion. A 18G Arrow arterial line was introduced into the Radial artery. The catheter was threaded over the guide wire and the needle was removed with appropriate pulsatile blood return. The catheter was then secured with a sterile Tegaderm. Perfusion to the extremity distal to the point of catheter insertion was checked and found to be adequate. Attending was present for the entire procedure. The patient tolerated the procedure well and there were no immediate complications.

## 2014-08-25 NOTE — Progress Notes (Signed)
ANTIBIOTIC CONSULT NOTE - INITIAL  Pharmacy Consult for Cefuroxime Indication: post-surgical prophylaxis  Allergies  Allergen Reactions  . Morphine And Related Nausea And Vomiting    Patient Measurements: Height: 5\' 11"  (180.3 cm) Weight: 154 lb (69.854 kg) IBW/kg (Calculated) : 70.8 Adjusted Body Weight:   Vital Signs: Temp: 98 F (36.7 C) (07/07 1611) Temp Source: Oral (07/07 1042) BP: 112/65 mmHg (07/07 1611) Pulse Rate: 61 (07/07 1611) Intake/Output from previous day:   Intake/Output from this shift: Total I/O In: 1500 [I.V.:1500] Out: 285 [Urine:135; Blood:150]  Labs: No results for input(s): WBC, HGB, PLT, LABCREA, CREATININE in the last 72 hours. Estimated Creatinine Clearance: 72.3 mL/min (by C-G formula based on Cr of 0.81). No results for input(s): VANCOTROUGH, VANCOPEAK, VANCORANDOM, GENTTROUGH, GENTPEAK, GENTRANDOM, TOBRATROUGH, TOBRAPEAK, TOBRARND, AMIKACINPEAK, AMIKACINTROU, AMIKACIN in the last 72 hours.   Microbiology: No results found for this or any previous visit (from the past 720 hour(s)).  Medical History: Past Medical History  Diagnosis Date  . Collagen vascular disease     Left carotid stenosis  . Hypertension   . COPD (chronic obstructive pulmonary disease)   . GERD (gastroesophageal reflux disease)   . PONV (postoperative nausea and vomiting)     Medications:  Prescriptions prior to admission  Medication Sig Dispense Refill Last Dose  . albuterol (PROVENTIL HFA;VENTOLIN HFA) 108 (90 BASE) MCG/ACT inhaler Inhale 2 puffs into the lungs every 6 (six) hours as needed for wheezing or shortness of breath.   Past Week at Unknown time  . aspirin 81 MG tablet Take 81 mg by mouth daily.   08/24/2014 at Unknown time  . atorvastatin (LIPITOR) 10 MG tablet Take 10 mg by mouth daily.   08/24/2014 at Unknown time  . Fish Oil-Cholecalciferol (FISH OIL + D3 PO) Take 1,000 mg by mouth 1 day or 1 dose.   Past Week at Unknown time   Assessment: CrCl = 72.3  ml/min  Goal of Therapy:  post-surgical infection prophylaxis   Plan:  Zinacef 1.5 gm IV Q12H X 2 doses currently ordered.  No dose adjustment needed.   Fountain Derusha D 08/25/2014,6:27 PM

## 2014-08-25 NOTE — Anesthesia Preprocedure Evaluation (Signed)
Anesthesia Evaluation  Patient identified by MRN, date of birth, ID band Patient awake    Reviewed: Allergy & Precautions, H&P , NPO status , Patient's Chart, lab work & pertinent test results, reviewed documented beta blocker date and time   History of Anesthesia Complications (+) PONV and history of anesthetic complications  Airway Mallampati: II  TM Distance: >3 FB Neck ROM: full    Dental  (+) Edentulous Upper, Edentulous Lower   Pulmonary COPDCurrent Smoker,  breath sounds clear to auscultation  Pulmonary exam normal       Cardiovascular Exercise Tolerance: Poor hypertension, - Past MI Normal cardiovascular examRhythm:regular Rate:Normal     Neuro/Psych negative neurological ROS  negative psych ROS   GI/Hepatic Neg liver ROS, GERD-  Medicated and Controlled,  Endo/Other  negative endocrine ROS  Renal/GU negative Renal ROS  negative genitourinary   Musculoskeletal   Abdominal   Peds  Hematology negative hematology ROS (+)   Anesthesia Other Findings   Reproductive/Obstetrics negative OB ROS                             Anesthesia Physical Anesthesia Plan  ASA: III  Anesthesia Plan: General and General ETT   Post-op Pain Management:    Induction:   Airway Management Planned:   Additional Equipment:   Intra-op Plan:   Post-operative Plan:   Informed Consent: I have reviewed the patients History and Physical, chart, labs and discussed the procedure including the risks, benefits and alternatives for the proposed anesthesia with the patient or authorized representative who has indicated his/her understanding and acceptance.     Plan Discussed with: Anesthesiologist, CRNA and Surgeon  Anesthesia Plan Comments:         Anesthesia Quick Evaluation

## 2014-08-25 NOTE — Op Note (Signed)
[facility]   OPERATIVE NOTE  PROCEDURE:   1.  left carotid endarterectomy with CorMatrix arterial patch reconstruction  PRE-OPERATIVE DIAGNOSIS: 1.  left carotid stenosis  POST-OPERATIVE DIAGNOSIS: same as above   SURGEON: Leotis Pain, MD  ASSISTANT(S): Hezzie Bump, PA-C  ANESTHESIA: general  ESTIMATED BLOOD LOSS: 50 cc  FINDING(S): 1.  Left carotid plaque.  SPECIMEN(S):  Carotid plaque (sent to Pathology)  INDICATIONS:   Kristie Walters is a 69 y.o. female who presents with a significant left carotid stenosis of about 70 %.  I discussed with the patient the risks, benefits, and alternatives to carotid endarterectomy.  I discussed the differences between carotid stenting and carotid endarterectomy. I discussed the procedural details of carotid endarterectomy with the patient.  The patient is aware that the risks of carotid endarterectomy include but are not limited to: bleeding, infection, stroke, myocardial infarction, death, cranial nerve injuries both temporary and permanent, neck hematoma, possible airway compromise, labile blood pressure post-operatively, cerebral hyperperfusion syndrome, and possible need for additional interventions in the future. The patient is aware of the risks and agrees to proceed forward with the procedure.  DESCRIPTION: After full informed written consent was obtained from the patient, the patient was brought back to the operating room and placed supine upon the operating table.  Prior to induction, the patient received IV antibiotics.  After obtaining adequate anesthesia, the patient was placed into a modified beach chair position with a shoulder roll in place and the patient's neck slightly hyperextended and rotated away from the surgical site.  The patient was prepped in the standard fashion for a carotid endarterectomy.  I made an incision anterior to the sternocleidomastoid muscle and dissected down through the subcutaneous  tissue.  The platysmas was opened with electrocautery.  Then I dissected down to the internal jugular vein and facial vein.  The facial vein is ligated and divided between 2-0 silk ties.  This was dissected posteriorly until I obtained visualization of the common carotid artery.  This was dissected out and then a vessel loop was placed around the common carotid artery.  I then dissected in a periadventitial fashion along the common carotid artery up to the bifurcation.  I then identified the external carotid artery and the superior thyroid artery.  I placed a vessel loop around the superior thyroid artery, and I also dissected out the external carotid artery and placed a vessel loop around it. In the process of this dissection, the hypoglossal nerve was identified and protected from harm.  I then dissected out the internal carotid artery until I identified an area in the internal carotid artery clearly above the stenosis.  I dissected slightly distal to this area, and placed a vessel loop around the artery. The distal internal carotid artery was quite small. At this point, we gave the patient 5000 units of intravenous heparin.  After this was allowed to circulate for several minutes, I pulled up control on the vessel loops to clamp the internal carotid artery, external carotid artery, superior thyroid artery, and then the common carotid artery.  I then made an arteriotomy in the common carotid artery with a 11 blade, and extended the arteriotomy with a Potts scissor down into the common carotid artery, then I carried the arteriotomy through the bifurcation into the internal carotid artery until I reached an area that was not diseased.  At this point, I took the shunt that previously been prepared and I inserted it into the internal  carotid artery first, and then into the common carotid artery taking care to flush and de-air prior to release of control. I did not use the typical Gilmer Mor shunt as the distal  internal carotid artery was too small to allow its passage safely. We used a 3 - 4 Silastic shunt. At this point, I started the endarterectomy in the common carotid artery with a Penfield elevator and carried this dissection down into the common carotid artery circumferentially.  Then I transected the plaque at a segment where it was adherent and transected the plaque with Potts scissors.  I then carried this dissection up into the external carotid artery.  The plaque was extracted by unclamping the external carotid artery and performing an eversion endarterectomy.  The dissection was then carried into the internal carotid artery where a nice feathered end point was created with gentle traction.  I passed the plaque off the field as a specimen. At this point I removed all loose flecks and remaining disease possible.  At this point, I was satisfied that the minimal remaining disease was densely adherent to the wall and wall integrity was intact.  I then fashioned a CorMatrix arterial patch for the artery and sewed it in place with two running stitch of 6-0 Prolene.  I started at the distal endpoint and ran one half the length of the arteriotomy.  I then cut and beveled the patch to an appropriate length to match the arteriotomy.  I started the second 6-0 Prolene at the proximal end point.  The medial suture line was completed and the lateral suture line was run approximately one quarter the length of the arteriotomy.  Prior to completing this patch angioplasty, I removed the shunt first from the internal carotid artery, from which there was excellent backbleeding, and clamped it.  Then I removed the shunt from the common carotid artery, from which there was excellent antegrade bleeding, and then clamped it.  At this point, I allowed the external carotid artery to backbleed, which was excellent.  Then I instilled heparinized saline in this patched artery and then completed the patch angioplasty in the usual fashion.   First, I released the clamp on the external carotid artery, then I released it on the common carotid artery.  After waiting a few seconds, I then released it on the internal carotid artery. Several minutes of pressure were held and 6-0 Prolene patch sutures were used as need for hemostasis.  At this point, I placed Surgicel and Evicel topical hemostatic agents.  There was no more active bleeding in the surgical site.  The sternocleidomastoid space was closed with three interrupted 3-0 Vicryl sutures. I then reapproximated the platysma muscle with a running stitch of 3-0 Vicryl.  The skin was then closed with a running subcuticular 4-0 Monocryl.  The skin was then cleaned, dried and Dermabond was used to reinforce the skin closure.  The patient awakened and was taken to the recovery room in stable condition, following commands and moving all four extremities without any apparent deficits.    COMPLICATIONS: none  CONDITION: stable  Kristie Walters  08/25/2014, 2:42 PM

## 2014-08-25 NOTE — OR Nursing (Signed)
Clamp time:   1344  Unclamp time:   6190  Shunt in time:   1349  Shunt out time: 1421  Clamp time:  [place]

## 2014-08-26 ENCOUNTER — Encounter: Payer: Self-pay | Admitting: Vascular Surgery

## 2014-08-26 LAB — CBC
HCT: 35.2 % (ref 35.0–47.0)
Hemoglobin: 12.3 g/dL (ref 12.0–16.0)
MCH: 30.2 pg (ref 26.0–34.0)
MCHC: 35 g/dL (ref 32.0–36.0)
MCV: 86.3 fL (ref 80.0–100.0)
Platelets: 171 10*3/uL (ref 150–440)
RBC: 4.08 MIL/uL (ref 3.80–5.20)
RDW: 13 % (ref 11.5–14.5)
WBC: 9.7 10*3/uL (ref 3.6–11.0)

## 2014-08-26 LAB — BASIC METABOLIC PANEL
ANION GAP: 10 (ref 5–15)
BUN: 16 mg/dL (ref 6–20)
CALCIUM: 8.8 mg/dL — AB (ref 8.9–10.3)
CHLORIDE: 106 mmol/L (ref 101–111)
CO2: 21 mmol/L — AB (ref 22–32)
CREATININE: 0.77 mg/dL (ref 0.44–1.00)
GFR calc Af Amer: >60 mL/min (ref >60)
GFR calc non Af Amer: >60 mL/min (ref >60)
GLUCOSE: 144 mg/dL — AB (ref 65–99)
Potassium: 4.2 mmol/L (ref 3.5–5.1)
Sodium: 137 mmol/L (ref 135–145)

## 2014-08-26 MED ORDER — CLOPIDOGREL BISULFATE 75 MG PO TABS: 75.0000 mg | ORAL_TABLET | Freq: Every day | ORAL | Status: DC

## 2014-08-26 MED ORDER — ONDANSETRON HCL 4 MG PO TABS: 4.0000 mg | ORAL_TABLET | Freq: Every day | ORAL | Status: DC | PRN

## 2014-08-26 MED ORDER — ONDANSETRON HCL 4 MG/2ML IJ SOLN
4.0000 mg | INTRAMUSCULAR | Status: DC | PRN
  Administered 2014-08-26: 4 mg via INTRAVENOUS

## 2014-08-26 MED ORDER — HYDROCODONE-ACETAMINOPHEN 5-325 MG PO TABS: 1.0000 | ORAL_TABLET | Freq: Four times a day (QID) | ORAL | Status: DC | PRN

## 2014-08-26 NOTE — Discharge Summary (Signed)
  [facility]    Discharge Summary    Patient ID:  Kristie Walters MRN: [id number] DOB/AGE: [birth year] 69 y.o.  Admit date: 08/25/2014 Discharge date: 08/26/2014 Date of Surgery: 08/25/2014 Surgeon: Surgeon(s): Algernon Huxley, MD  Admission Diagnosis: CAROTID ARTERY STENOSIS  Discharge Diagnoses:  CAROTID ARTERY STENOSIS  Secondary Diagnoses: Past Medical History  Diagnosis Date  . Collagen vascular disease     Left carotid stenosis  . Hypertension   . COPD (chronic obstructive pulmonary disease)   . GERD (gastroesophageal reflux disease)   . PONV (postoperative nausea and vomiting)     Procedure(s): ENDARTERECTOMY CAROTID  Discharged Condition: good  HPI:  70% ulcerated carotid artery stenosis.  Brought in for elective repair  Hospital Course:  Kristie Walters is a 69 y.o. female is S/P left ENDARTERECTOMY CAROTID Extubated: in OR.  Neuro exam intact Physical exam: neuro exam intact.  Doing well.  NAD Post-op wounds c/d/i Pt. Ambulating, voiding and taking PO diet without difficulty. Pt pain controlled with PO pain meds. Labs as below Complications: none  Consults:     Significant Diagnostic Studies: CBC Lab Results  Component Value Date   WBC 9.7 08/26/2014   HGB 12.3 08/26/2014   HCT 35.2 08/26/2014   MCV 86.3 08/26/2014   PLT 171 08/26/2014    BMET    Component Value Date/Time   NA 137 08/26/2014 0526   NA 141 02/20/2014 1016   K 4.2 08/26/2014 0526   K 4.0 02/20/2014 1016   CL 106 08/26/2014 0526   CL 106 02/20/2014 1016   CO2 21* 08/26/2014 0526   CO2 31 02/20/2014 1016   GLUCOSE 144* 08/26/2014 0526   GLUCOSE 92 02/20/2014 1016   BUN 16 08/26/2014 0526   BUN 11 02/20/2014 1016   CREATININE 0.77 08/26/2014 0526   CREATININE 0.95 02/20/2014 1016   CALCIUM 8.8* 08/26/2014 0526   CALCIUM 9.2 02/20/2014 1016   GFRNONAA >60 08/26/2014 0526   GFRNONAA >60 06/24/2013 0010   GFRAA >60 08/26/2014 0526   GFRAA >60  06/24/2013 0010   COAG Lab Results  Component Value Date   INR 1.0 02/20/2014     Disposition:  Discharge to :home    Medication List    ASK your doctor about these medications        albuterol 108 (90 BASE) MCG/ACT inhaler  Commonly known as:  PROVENTIL HFA;VENTOLIN HFA  Inhale 2 puffs into the lungs every 6 (six) hours as needed for wheezing or shortness of breath.     aspirin 81 MG tablet  Take 81 mg by mouth daily.     atorvastatin 10 MG tablet  Commonly known as:  LIPITOR  Take 10 mg by mouth daily.     FISH OIL + D3 PO  Take 1,000 mg by mouth 1 day or 1 dose.       Verbal and written Discharge instructions given to the patient. Wound care per Discharge AVS     Follow-up Information    Follow up with Michial Disney, MD In 3 weeks.   Specialties:  Vascular Surgery, Radiology, Interventional Cardiology   Why:  with me or PA with carotid duplex   Contact information:   [place] [address] [phone]       Signed: Leotis Pain, MD  08/26/2014, 8:37 AM

## 2014-08-26 NOTE — Discharge Instructions (Signed)
No driving one week No heavy lifting one week May shower tomorrow Call or contact our office with fever >101, wound redness or drainage, severe pain, neurologic changes, or other issues

## 2014-08-26 NOTE — Progress Notes (Signed)
Pt d/c home, instructions and prescriptions given to pt.  All belongings taken with pt.

## 2014-08-26 NOTE — Progress Notes (Signed)
RN paged Dr. Lucky Cowboy about patient's nausea with zofran every six hours prn and not available yet. MD changed zofran frequency to every 4 hours prn.

## 2014-08-26 NOTE — Anesthesia Postprocedure Evaluation (Signed)
  Anesthesia Post-op Note  Patient: Kristie Walters  Procedure(s) Performed: Procedure(s): ENDARTERECTOMY CAROTID (Left)  Anesthesia type:General, General ETT  Patient location: PACU  Post pain: Pain level controlled  Post assessment: Post-op Vital signs reviewed, Patient's Cardiovascular Status Stable, Respiratory Function Stable, Patent Airway and No signs of Nausea or vomiting  Post vital signs: Reviewed and stable  Last Vitals:  Filed Vitals:   08/26/14 0900  BP: 125/57  Pulse: 82  Temp:   Resp: 15    Level of consciousness: awake, alert  and patient cooperative  Complications: No apparent anesthesia complications

## 2014-08-29 LAB — SURGICAL PATHOLOGY

## 2014-12-20 DIAGNOSIS — I6529 Occlusion and stenosis of unspecified carotid artery: Secondary | ICD-10-CM | POA: Diagnosis not present

## 2014-12-20 DIAGNOSIS — F17200 Nicotine dependence, unspecified, uncomplicated: Secondary | ICD-10-CM | POA: Diagnosis not present

## 2014-12-20 DIAGNOSIS — I6523 Occlusion and stenosis of bilateral carotid arteries: Secondary | ICD-10-CM | POA: Diagnosis not present

## 2015-02-23 DIAGNOSIS — J449 Chronic obstructive pulmonary disease, unspecified: Secondary | ICD-10-CM | POA: Diagnosis not present

## 2015-02-23 DIAGNOSIS — J019 Acute sinusitis, unspecified: Secondary | ICD-10-CM | POA: Diagnosis not present

## 2015-04-02 DIAGNOSIS — J019 Acute sinusitis, unspecified: Secondary | ICD-10-CM | POA: Diagnosis not present

## 2015-04-12 ENCOUNTER — Other Ambulatory Visit: Payer: Self-pay | Admitting: Family Medicine

## 2015-04-12 ENCOUNTER — Ambulatory Visit: Payer: Self-pay | Admitting: Family Medicine

## 2015-04-18 DIAGNOSIS — N7689 Other specified inflammation of vagina and vulva: Secondary | ICD-10-CM | POA: Diagnosis not present

## 2015-04-26 ENCOUNTER — Encounter: Payer: Self-pay | Admitting: Family Medicine

## 2015-04-26 ENCOUNTER — Ambulatory Visit (INDEPENDENT_AMBULATORY_CARE_PROVIDER_SITE_OTHER): Payer: Medicare Other | Admitting: Family Medicine

## 2015-04-26 VITALS — BP 122/68 | HR 103 | Temp 98.9°F | Resp 20 | Ht 71.0 in | Wt 158.6 lb

## 2015-04-26 DIAGNOSIS — E785 Hyperlipidemia, unspecified: Secondary | ICD-10-CM

## 2015-04-26 DIAGNOSIS — K219 Gastro-esophageal reflux disease without esophagitis: Secondary | ICD-10-CM | POA: Insufficient documentation

## 2015-04-26 DIAGNOSIS — J439 Emphysema, unspecified: Secondary | ICD-10-CM | POA: Insufficient documentation

## 2015-04-26 DIAGNOSIS — J449 Chronic obstructive pulmonary disease, unspecified: Secondary | ICD-10-CM | POA: Insufficient documentation

## 2015-04-26 DIAGNOSIS — R030 Elevated blood-pressure reading, without diagnosis of hypertension: Secondary | ICD-10-CM | POA: Diagnosis not present

## 2015-04-26 DIAGNOSIS — R739 Hyperglycemia, unspecified: Secondary | ICD-10-CM | POA: Diagnosis not present

## 2015-04-26 MED ORDER — OMEPRAZOLE 20 MG PO CPDR: 20.0000 mg | DELAYED_RELEASE_CAPSULE | Freq: Every day | ORAL | Status: DC

## 2015-04-26 NOTE — Progress Notes (Signed)
Name: Kristie Walters   MRN: [id number]    DOB: 14-Mar-1945   Date:04/26/2015       Progress Note  Subjective  Chief Complaint  Chief Complaint  Patient presents with  . Establish Care    NP  . Pain    Gastroesophageal Reflux She complains of abdominal pain and heartburn. She reports no dysphagia, no nausea or no sore throat. This is a chronic problem. The problem has been gradually worsening. The heartburn is located in the abdomen. The symptoms are aggravated by stress and certain foods (spicy and fried foods, cooffee, especially after supper.). She has tried an antacid (OTC Antacids.) for the symptoms. The treatment provided no relief. Past procedures do not include an abdominal ultrasound or an EGD.  Hyperlipidemia This is a chronic problem. The problem is controlled. Lipid results: No blood work available, last lipid check in 2014. Pertinent negatives include no leg pain, myalgias or shortness of breath. Current antihyperlipidemic treatment includes statins.    Pt. Is here to establish care.  Previous PCP Dr. Tamera Stands. Records are not available at this time.  Past Medical History  Diagnosis Date  . Collagen vascular disease ([place])     Left carotid stenosis  . COPD (chronic obstructive pulmonary disease) ([place])   . GERD (gastroesophageal reflux disease)   . PONV (postoperative nausea and vomiting)   . Carotid artery disease ([place])     s/p L. CEA in July 2016  . Elevated blood pressure (not hypertension)     Past Surgical History  Procedure Laterality Date  . Peripheral vascular catheterization N/A 07/20/2014    Procedure: Carotid Angiography;  Surgeon: Algernon Huxley, MD;  Location: [facility];  Service: Cardiovascular;  Laterality: N/A;  . Tonsillectomy    . Mohs surgery    . Endarterectomy Left 08/25/2014    Procedure: ENDARTERECTOMY CAROTID;  Surgeon: Algernon Huxley, MD;  Location: [facility];  Service: Vascular;  Laterality: Left;  . Tubal ligation      Family History   Problem Relation Age of Onset  . Heart attack Mother   . Hypercholesterolemia Mother   . Hypertension Mother   . Peripheral vascular disease Mother   . Dementia Mother   . Hypothyroidism Mother   . CVA Father   . Liver cancer Father   . Diabetes Brother     Social History   Social History  . Marital Status: Divorced    Spouse Name: N/A  . Number of Children: N/A  . Years of Education: N/A   Occupational History  . Not on file.   Social History Main Topics  . Smoking status: Current Every Day Smoker -- 1.00 packs/day    Types: Cigarettes  . Smokeless tobacco: Current User  . Alcohol Use: No  . Drug Use: No  . Sexual Activity: Yes    Birth Control/ Protection: Post-menopausal   Other Topics Concern  . Not on file   Social History Narrative     Current outpatient prescriptions:  .  albuterol (PROVENTIL HFA;VENTOLIN HFA) 108 (90 BASE) MCG/ACT inhaler, Inhale 2 puffs into the lungs every 6 (six) hours as needed for wheezing or shortness of breath., Disp: , Rfl:  .  aspirin 81 MG tablet, Take 81 mg by mouth daily., Disp: , Rfl:  .  atorvastatin (LIPITOR) 10 MG tablet, Take 10 mg by mouth daily., Disp: , Rfl:  .  cetirizine (ZYRTEC) 10 MG tablet, Take 10 mg by mouth daily., Disp: , Rfl:  .  Fish Oil-Cholecalciferol (FISH OIL + D3 PO), Take 1,000 mg by mouth 1 day or 1 dose., Disp: , Rfl:   Allergies  Allergen Reactions  . Morphine Nausea Only and Nausea And Vomiting  . Morphine And Related Nausea And Vomiting     Review of Systems  HENT: Negative for sore throat.   Respiratory: Negative for shortness of breath.   Gastrointestinal: Positive for heartburn and abdominal pain. Negative for dysphagia and nausea.  Musculoskeletal: Negative for myalgias.    Objective  Filed Vitals:   04/26/15 1010  BP: 122/68  Pulse: 103  Temp: 98.9 F (37.2 C)  TempSrc: Oral  Resp: 20  Height: 5\' 11"  (1.803 m)  Weight: 158 lb 9.6 oz (71.94 kg)  SpO2: 96%    Physical  Exam  Constitutional: She is oriented to person, place, and time and well-developed, well-nourished, and in no distress.  HENT:  Head: Normocephalic and atraumatic.  Cardiovascular: Normal rate and regular rhythm.   No murmur heard. Pulmonary/Chest: Effort normal and breath sounds normal.  Abdominal: Soft. Bowel sounds are normal.  Neurological: She is alert and oriented to person, place, and time.  Psychiatric: Mood, memory, affect and judgment normal.  Nursing note and vitals reviewed.    Assessment & Plan  1. Chronic obstructive pulmonary disease, unspecified COPD type ([place]) Only on rescue inhaler, symptoms stable.  2. Gastroesophageal reflux disease, esophagitis presence not specified  - omeprazole (PRILOSEC) 20 MG capsule; Take 1 capsule (20 mg total) by mouth daily.  Dispense: 30 capsule; Refill: 3  3. Hyperlipidemia Obtain lipid panel and follow-up with medication adjustment. - Lipid Profile - Comprehensive Metabolic Panel (CMET)  4. Hyperglycemia  - POCT HgB A1C - POCT Glucose (CBG)  5. Elevated blood pressure (not hypertension) Patient has been diagnosed with hypertension in the past, however not currently on any antihypertensive and blood pressure is stable. We'll continue to monitor.    Ragena Fiola Asad A[facility] 04/26/2015 10:35 AM

## 2015-05-08 DIAGNOSIS — E785 Hyperlipidemia, unspecified: Secondary | ICD-10-CM | POA: Diagnosis not present

## 2015-05-09 LAB — LIPID PANEL
Chol/HDL Ratio: 5.2 ratio — ABNORMAL HIGH (ref 0.0–4.4)
Cholesterol, Total: 155 mg/dL (ref 100–199)
HDL: 30 mg/dL — ABNORMAL LOW
LDL Calculated: 76 mg/dL (ref 0–99)
Triglycerides: 244 mg/dL — ABNORMAL HIGH (ref 0–149)
VLDL Cholesterol Cal: 49 mg/dL — ABNORMAL HIGH (ref 5–40)

## 2015-05-09 LAB — COMPREHENSIVE METABOLIC PANEL
ALBUMIN: 4.1 g/dL (ref 3.6–4.8)
ALK PHOS: 196 IU/L — AB (ref 39–117)
ALT: 25 IU/L (ref 0–32)
AST: 19 IU/L (ref 0–40)
Albumin/Globulin Ratio: 1.6 (ref 1.2–2.2)
BUN / CREAT RATIO: 16 (ref 11–26)
BUN: 14 mg/dL (ref 8–27)
Bilirubin Total: 0.4 mg/dL (ref 0.0–1.2)
CHLORIDE: 104 mmol/L (ref 96–106)
CO2: 21 mmol/L (ref 18–29)
Calcium: 9.5 mg/dL (ref 8.7–10.3)
Creatinine, Ser: 0.87 mg/dL (ref 0.57–1.00)
GFR calc non Af Amer: 68 mL/min/{1.73_m2} (ref >59)
GFR, EST AFRICAN AMERICAN: 79 mL/min/{1.73_m2} (ref >59)
GLUCOSE: 104 mg/dL — AB (ref 65–99)
Globulin, Total: 2.5 g/dL (ref 1.5–4.5)
POTASSIUM: 5.7 mmol/L — AB (ref 3.5–5.2)
SODIUM: 143 mmol/L (ref 134–144)
TOTAL PROTEIN: 6.6 g/dL (ref 6.0–8.5)

## 2015-05-29 ENCOUNTER — Encounter: Payer: Self-pay | Admitting: Family Medicine

## 2015-05-29 ENCOUNTER — Ambulatory Visit (INDEPENDENT_AMBULATORY_CARE_PROVIDER_SITE_OTHER): Payer: Medicare Other | Admitting: Family Medicine

## 2015-05-29 VITALS — BP 124/76 | HR 105 | Temp 98.0°F | Resp 16 | Ht 71.0 in | Wt 158.8 lb

## 2015-05-29 DIAGNOSIS — R739 Hyperglycemia, unspecified: Secondary | ICD-10-CM | POA: Diagnosis not present

## 2015-05-29 DIAGNOSIS — R748 Abnormal levels of other serum enzymes: Secondary | ICD-10-CM | POA: Diagnosis not present

## 2015-05-29 LAB — POCT GLYCOSYLATED HEMOGLOBIN (HGB A1C): Hemoglobin A1C: 5.9

## 2015-05-29 NOTE — Progress Notes (Signed)
Name: Kristie Walters   MRN: [id number]    DOB: Dec 05, 1945   Date:05/29/2015       Progress Note  Subjective  Chief Complaint  Chief Complaint  Patient presents with  . COPD    patient is here for her 48-month f/u  . Gastroesophageal Reflux  . Hyperlipidemia  . Hyperglycemia  . Elevated blood pressure (not hypertension)  . Orders    mammogra, dexa and colorgaurd  . Immunizations    tdap, shingles, pna  . low dose lung cancer screening    patient was told that she needed this since she has been smoking since she was 70yrs old    HPI  Pt. Is here to repeat Alkaline Phosphatase levels, elevated at last office visit at 196 IU/L. AST and ALT were within normal range. Pt. reports she is in usual state of health, has aches and pains in her left shoulder and arm. Of note: Pt's father passed way of liver cancer.   Past Medical History  Diagnosis Date  . Collagen vascular disease ([place])     Left carotid stenosis  . COPD (chronic obstructive pulmonary disease) ([place])   . GERD (gastroesophageal reflux disease)   . PONV (postoperative nausea and vomiting)   . Carotid artery disease ([place])     s/p L. CEA in July 2016  . Elevated blood pressure (not hypertension)     Past Surgical History  Procedure Laterality Date  . Peripheral vascular catheterization N/A 07/20/2014    Procedure: Carotid Angiography;  Surgeon: Algernon Huxley, MD;  Location: [facility];  Service: Cardiovascular;  Laterality: N/A;  . Tonsillectomy    . Mohs surgery    . Endarterectomy Left 08/25/2014    Procedure: ENDARTERECTOMY CAROTID;  Surgeon: Algernon Huxley, MD;  Location: [facility];  Service: Vascular;  Laterality: Left;  . Tubal ligation      Family History  Problem Relation Age of Onset  . Heart attack Mother   . Hypercholesterolemia Mother   . Hypertension Mother   . Peripheral vascular disease Mother   . Dementia Mother   . Hypothyroidism Mother   . CVA Father   . Liver cancer Father   . Diabetes  Brother     Social History   Social History  . Marital Status: Divorced    Spouse Name: N/A  . Number of Children: N/A  . Years of Education: N/A   Occupational History  . Not on file.   Social History Main Topics  . Smoking status: Current Every Day Smoker -- 1.00 packs/day    Types: Cigarettes  . Smokeless tobacco: Current User  . Alcohol Use: No  . Drug Use: No  . Sexual Activity: Yes    Birth Control/ Protection: Post-menopausal   Other Topics Concern  . Not on file   Social History Narrative     Current outpatient prescriptions:  .  albuterol (PROVENTIL HFA;VENTOLIN HFA) 108 (90 BASE) MCG/ACT inhaler, Inhale 2 puffs into the lungs every 6 (six) hours as needed for wheezing or shortness of breath., Disp: , Rfl:  .  aspirin 81 MG tablet, Take 81 mg by mouth daily., Disp: , Rfl:  .  atorvastatin (LIPITOR) 10 MG tablet, Take 10 mg by mouth daily., Disp: , Rfl:  .  cetirizine (ZYRTEC) 10 MG tablet, Take 10 mg by mouth daily., Disp: , Rfl:  .  Fish Oil-Cholecalciferol (FISH OIL + D3 PO), Take 1,000 mg by mouth 1 day or 1 dose., Disp: ,  Rfl:  .  omeprazole (PRILOSEC) 20 MG capsule, Take 1 capsule (20 mg total) by mouth daily., Disp: 30 capsule, Rfl: 3  Allergies  Allergen Reactions  . Morphine Nausea Only and Nausea And Vomiting  . Morphine And Related Nausea And Vomiting     Review of Systems  Constitutional: Negative for fever and chills.  Gastrointestinal: Negative for nausea and vomiting.  Musculoskeletal: Positive for myalgias and joint pain.    Objective  Filed Vitals:   05/29/15 1001  BP: 124/76  Pulse: 105  Temp: 98 F (36.7 C)  TempSrc: Oral  Resp: 16  Height: 5\' 11"  (1.803 m)  Weight: 158 lb 12.8 oz (72.031 kg)  SpO2: 95%    Physical Exam  Constitutional: She is oriented to person, place, and time and well-developed, well-nourished, and in no distress.  Cardiovascular: Normal rate and regular rhythm.   Pulmonary/Chest: Effort normal and  breath sounds normal.  Abdominal: There is tenderness in the right upper quadrant.    Neurological: She is alert and oriented to person, place, and time.  Nursing note and vitals reviewed.    Assessment & Plan  1. Elevated alkaline phosphatase level Will obtain right upper quadrant ultrasound for evaluation of elevated alkaline phosphatase level. - US Abdomen Limited RUQ; Future - Comprehensive Metabolic Panel (CMET)  2. Hyperglycemia  - POCT HgB A1C   Fard Borunda Asad A[facility] 05/29/2015 10:21 AM

## 2015-05-30 LAB — COMPREHENSIVE METABOLIC PANEL
ALT: 25 IU/L (ref 0–32)
AST: 23 IU/L (ref 0–40)
Albumin/Globulin Ratio: 1.9 (ref 1.2–2.2)
Albumin: 4.4 g/dL (ref 3.6–4.8)
Alkaline Phosphatase: 192 IU/L — ABNORMAL HIGH (ref 39–117)
BILIRUBIN TOTAL: 0.5 mg/dL (ref 0.0–1.2)
BUN/Creatinine Ratio: 15 (ref 12–28)
BUN: 12 mg/dL (ref 8–27)
CALCIUM: 9.7 mg/dL (ref 8.7–10.3)
CHLORIDE: 101 mmol/L (ref 96–106)
CO2: 23 mmol/L (ref 18–29)
CREATININE: 0.82 mg/dL (ref 0.57–1.00)
GFR calc non Af Amer: 73 mL/min/{1.73_m2} (ref >59)
GFR, EST AFRICAN AMERICAN: 84 mL/min/{1.73_m2} (ref >59)
Globulin, Total: 2.3 g/dL (ref 1.5–4.5)
Glucose: 106 mg/dL — ABNORMAL HIGH (ref 65–99)
Potassium: 4.8 mmol/L (ref 3.5–5.2)
Sodium: 142 mmol/L (ref 134–144)
TOTAL PROTEIN: 6.7 g/dL (ref 6.0–8.5)

## 2015-06-14 DIAGNOSIS — R21 Rash and other nonspecific skin eruption: Secondary | ICD-10-CM | POA: Diagnosis not present

## 2015-06-21 DIAGNOSIS — E785 Hyperlipidemia, unspecified: Secondary | ICD-10-CM | POA: Diagnosis not present

## 2015-06-21 DIAGNOSIS — I6523 Occlusion and stenosis of bilateral carotid arteries: Secondary | ICD-10-CM | POA: Diagnosis not present

## 2015-06-29 ENCOUNTER — Encounter: Payer: Self-pay | Admitting: Family Medicine

## 2015-06-29 ENCOUNTER — Ambulatory Visit (INDEPENDENT_AMBULATORY_CARE_PROVIDER_SITE_OTHER): Payer: Medicare Other | Admitting: Family Medicine

## 2015-06-29 VITALS — BP 138/80 | HR 100 | Temp 98.5°F | Resp 14 | Ht 71.0 in | Wt 161.4 lb

## 2015-06-29 DIAGNOSIS — S30861D Insect bite (nonvenomous) of abdominal wall, subsequent encounter: Secondary | ICD-10-CM

## 2015-06-29 DIAGNOSIS — S30861A Insect bite (nonvenomous) of abdominal wall, initial encounter: Secondary | ICD-10-CM | POA: Insufficient documentation

## 2015-06-29 DIAGNOSIS — W57XXXA Bitten or stung by nonvenomous insect and other nonvenomous arthropods, initial encounter: Secondary | ICD-10-CM

## 2015-06-29 DIAGNOSIS — W57XXXD Bitten or stung by nonvenomous insect and other nonvenomous arthropods, subsequent encounter: Secondary | ICD-10-CM

## 2015-06-29 NOTE — Progress Notes (Signed)
Name: Kristie Walters   MRN: [id number]    DOB: 1945-02-28   Date:06/29/2015       Progress Note  Subjective  Chief Complaint  No chief complaint on file.   HPI  Tick Bite:   Pt. Had an embedded tick removed from her left groin area 2 weeks ago at the Urgent Care. She received Doxycycline prescription for 7 days, no blood tests were done.  She reports no pain at the site of tick bite, no fever but having headaches. She just 'doesn't like the way it looks' referring to the area from where the tick was removed.   Past Medical History  Diagnosis Date  . Collagen vascular disease ([place])     Left carotid stenosis  . COPD (chronic obstructive pulmonary disease) ([place])   . GERD (gastroesophageal reflux disease)   . PONV (postoperative nausea and vomiting)   . Carotid artery disease ([place])     s/p L. CEA in July 2016  . Elevated blood pressure (not hypertension)     Past Surgical History  Procedure Laterality Date  . Peripheral vascular catheterization N/A 07/20/2014    Procedure: Carotid Angiography;  Surgeon: Algernon Huxley, MD;  Location: [facility];  Service: Cardiovascular;  Laterality: N/A;  . Tonsillectomy    . Mohs surgery    . Endarterectomy Left 08/25/2014    Procedure: ENDARTERECTOMY CAROTID;  Surgeon: Algernon Huxley, MD;  Location: [facility];  Service: Vascular;  Laterality: Left;  . Tubal ligation      Family History  Problem Relation Age of Onset  . Heart attack Mother   . Hypercholesterolemia Mother   . Hypertension Mother   . Peripheral vascular disease Mother   . Dementia Mother   . Hypothyroidism Mother   . CVA Father   . Liver cancer Father   . Diabetes Brother     Social History   Social History  . Marital Status: Divorced    Spouse Name: N/A  . Number of Children: N/A  . Years of Education: N/A   Occupational History  . Not on file.   Social History Main Topics  . Smoking status: Current Every Day Smoker -- 1.00 packs/day    Types: Cigarettes  .  Smokeless tobacco: Current User  . Alcohol Use: No  . Drug Use: No  . Sexual Activity: Yes    Birth Control/ Protection: Post-menopausal   Other Topics Concern  . Not on file   Social History Narrative     Current outpatient prescriptions:  .  albuterol (PROVENTIL HFA;VENTOLIN HFA) 108 (90 BASE) MCG/ACT inhaler, Inhale 2 puffs into the lungs every 6 (six) hours as needed for wheezing or shortness of breath., Disp: , Rfl:  .  aspirin 81 MG tablet, Take 81 mg by mouth daily., Disp: , Rfl:  .  atorvastatin (LIPITOR) 10 MG tablet, Take 10 mg by mouth daily., Disp: , Rfl:  .  cetirizine (ZYRTEC) 10 MG tablet, Take 10 mg by mouth daily., Disp: , Rfl:  .  Fish Oil-Cholecalciferol (FISH OIL + D3 PO), Take 1,000 mg by mouth 1 day or 1 dose., Disp: , Rfl:  .  omeprazole (PRILOSEC) 20 MG capsule, Take 1 capsule (20 mg total) by mouth daily., Disp: 30 capsule, Rfl: 3  Allergies  Allergen Reactions  . Morphine Nausea Only and Nausea And Vomiting  . Morphine And Related Nausea And Vomiting     Review of Systems  Constitutional: Positive for malaise/fatigue. Negative for fever  and chills.  Skin: Positive for itching and rash.    Objective  Filed Vitals:   06/29/15 1057  BP: 138/80  Pulse: 100  Temp: 98.5 F (36.9 C)  TempSrc: Oral  Resp: 14  Height: 5\' 11"  (1.803 m)  Weight: 161 lb 6.4 oz (73.211 kg)  SpO2: 94%    Physical Exam  Abdominal:    Skin: Rash noted. Rash is pustular.     Pustular erythematous lesion in the left groin area, no discharge noted, site of tick removal 2 weeks ago  Nursing note and vitals reviewed.   Assessment & Plan  1. Tick bite of groin, subsequent encounter Complete the doxycycline course after tick removal, lesion at the site is still there along with some systemic symptoms. Obtain laboratory evaluation for tickborne illnesses. Consider a second course of doxycycline as appropriate after lab work. - [place] mtn spotted fvr abs pnl(IgG+IgM) -  Lyme Disease, IgM, Early Test w/ Rflx - Ehrlichia antibody panel - Q Fever Abs IgG, IgM w/ reflex titer - CBC with Differential - Comprehensive Metabolic Panel (CMET)    Dolton Shaker Asad A. [facility] 06/29/2015 11:44 AM

## 2015-07-04 LAB — CBC WITH DIFFERENTIAL/PLATELET
BASOS ABS: 0.1 10*3/uL (ref 0.0–0.2)
Basos: 1 %
EOS (ABSOLUTE): 0.2 10*3/uL (ref 0.0–0.4)
Eos: 2 %
HEMOGLOBIN: 14.5 g/dL (ref 11.1–15.9)
Hematocrit: 41.8 % (ref 34.0–46.6)
Immature Grans (Abs): 0 10*3/uL (ref 0.0–0.1)
Immature Granulocytes: 0 %
LYMPHS ABS: 2.3 10*3/uL (ref 0.7–3.1)
Lymphs: 27 %
MCH: 29.5 pg (ref 26.6–33.0)
MCHC: 34.7 g/dL (ref 31.5–35.7)
MCV: 85 fL (ref 79–97)
MONOCYTES: 7 %
Monocytes Absolute: 0.6 10*3/uL (ref 0.1–0.9)
NEUTROS ABS: 5.3 10*3/uL (ref 1.4–7.0)
Neutrophils: 63 %
Platelets: 253 10*3/uL (ref 150–379)
RBC: 4.91 x10E6/uL (ref 3.77–5.28)
RDW: 13.2 % (ref 12.3–15.4)
WBC: 8.4 10*3/uL (ref 3.4–10.8)

## 2015-07-04 LAB — COMPREHENSIVE METABOLIC PANEL
ALBUMIN: 4.2 g/dL (ref 3.5–4.8)
ALK PHOS: 194 IU/L — AB (ref 39–117)
ALT: 27 IU/L (ref 0–32)
AST: 26 IU/L (ref 0–40)
Albumin/Globulin Ratio: 1.6 (ref 1.2–2.2)
BUN / CREAT RATIO: 14 (ref 12–28)
BUN: 11 mg/dL (ref 8–27)
Bilirubin Total: 0.7 mg/dL (ref 0.0–1.2)
CO2: 25 mmol/L (ref 18–29)
CREATININE: 0.81 mg/dL (ref 0.57–1.00)
Calcium: 9.6 mg/dL (ref 8.7–10.3)
Chloride: 100 mmol/L (ref 96–106)
GFR calc non Af Amer: 74 mL/min/{1.73_m2} (ref >59)
GFR, EST AFRICAN AMERICAN: 85 mL/min/{1.73_m2} (ref >59)
GLOBULIN, TOTAL: 2.7 g/dL (ref 1.5–4.5)
Glucose: 97 mg/dL (ref 65–99)
Potassium: 4.7 mmol/L (ref 3.5–5.2)
Sodium: 141 mmol/L (ref 134–144)
TOTAL PROTEIN: 6.9 g/dL (ref 6.0–8.5)

## 2015-07-04 LAB — EHRLICHIA ANTIBODY PANEL
E. CHAFFEENSIS (HME) IGM TITER: NEGATIVE
E. CHAFFEENSIS IGG AB: NEGATIVE
HGE IGG TITER: NEGATIVE
HGE IGM TITER: NEGATIVE

## 2015-07-04 LAB — ROCKY MTN SPOTTED FVR ABS PNL(IGG+IGM)
RMSF IgG: NEGATIVE
RMSF IgM: 0.37 index (ref 0.00–0.89)

## 2015-07-04 LAB — LYME, IGM, EARLY TEST/REFLEX

## 2015-07-12 ENCOUNTER — Ambulatory Visit (INDEPENDENT_AMBULATORY_CARE_PROVIDER_SITE_OTHER): Payer: Medicare Other | Admitting: Family Medicine

## 2015-07-12 ENCOUNTER — Encounter: Payer: Self-pay | Admitting: Family Medicine

## 2015-07-12 VITALS — BP 136/80 | HR 109 | Temp 98.3°F | Resp 18 | Ht 71.0 in | Wt 158.7 lb

## 2015-07-12 DIAGNOSIS — Z0000 Encounter for general adult medical examination without abnormal findings: Secondary | ICD-10-CM

## 2015-07-12 DIAGNOSIS — N6310 Unspecified lump in the right breast, unspecified quadrant: Secondary | ICD-10-CM | POA: Insufficient documentation

## 2015-07-12 DIAGNOSIS — Z13820 Encounter for screening for osteoporosis: Secondary | ICD-10-CM | POA: Diagnosis not present

## 2015-07-12 DIAGNOSIS — N63 Unspecified lump in breast: Secondary | ICD-10-CM | POA: Diagnosis not present

## 2015-07-12 DIAGNOSIS — Z720 Tobacco use: Secondary | ICD-10-CM | POA: Diagnosis not present

## 2015-07-12 DIAGNOSIS — Z01419 Encounter for gynecological examination (general) (routine) without abnormal findings: Secondary | ICD-10-CM

## 2015-07-12 NOTE — Progress Notes (Signed)
Name: Kristie Walters   MRN: [id number]    DOB: Jul 13, 1945   Date:07/12/2015       Progress Note  Subjective  Chief Complaint  Chief Complaint  Patient presents with  . Annual Exam    CPE    Nicotine Dependence Presents for initial visit. Symptoms include cravings and sore throat. Preferred tobacco types include cigarettes. Her urge triggers include stress. She smokes 1 pack of cigarettes per day. She started smoking when she was [age] years old. Past treatments include nothing. Jazmin is not interested in quitting.    Pt. Is here for a CPE. 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  Colonoscopy was also 3 years ago, she does not wish to have another colonoscopy.   Past Medical History  Diagnosis Date  . Collagen vascular disease ([place])     Left carotid stenosis  . COPD (chronic obstructive pulmonary disease) ([place])   . GERD (gastroesophageal reflux disease)   . PONV (postoperative nausea and vomiting)   . Carotid artery disease ([place])     s/p L. CEA in July 2016  . Elevated blood pressure (not hypertension)     Past Surgical History  Procedure Laterality Date  . Peripheral vascular catheterization N/A 07/20/2014    Procedure: Carotid Angiography;  Surgeon: Algernon Huxley, MD;  Location: [facility];  Service: Cardiovascular;  Laterality: N/A;  . Tonsillectomy    . Mohs surgery    . Endarterectomy Left 08/25/2014    Procedure: ENDARTERECTOMY CAROTID;  Surgeon: Algernon Huxley, MD;  Location: [facility];  Service: Vascular;  Laterality: Left;  . Tubal ligation      Family History  Problem Relation Age of Onset  . Heart attack Mother   . Hypercholesterolemia Mother   . Hypertension Mother   . Peripheral vascular disease Mother   . Dementia Mother   . Hypothyroidism Mother   . CVA Father   . Liver cancer Father   . Diabetes Brother     Social History   Social History  . Marital Status: Divorced    Spouse Name: N/A  .  Number of Children: N/A  . Years of Education: N/A   Occupational History  . Not on file.   Social History Main Topics  . Smoking status: Current Every Day Smoker -- 1.00 packs/day    Types: Cigarettes  . Smokeless tobacco: Current User  . Alcohol Use: No  . Drug Use: No  . Sexual Activity: Yes    Birth Control/ Protection: Post-menopausal   Other Topics Concern  . Not on file   Social History Narrative     Current outpatient prescriptions:  .  albuterol (PROVENTIL HFA;VENTOLIN HFA) 108 (90 BASE) MCG/ACT inhaler, Inhale 2 puffs into the lungs every 6 (six) hours as needed for wheezing or shortness of breath., Disp: , Rfl:  .  aspirin 81 MG tablet, Take 81 mg by mouth daily., Disp: , Rfl:  .  atorvastatin (LIPITOR) 10 MG tablet, Take 10 mg by mouth daily., Disp: , Rfl:  .  cetirizine (ZYRTEC) 10 MG tablet, Take 10 mg by mouth daily., Disp: , Rfl:  .  Fish Oil-Cholecalciferol (FISH OIL + D3 PO), Take 1,000 mg by mouth 1 day or 1 dose., Disp: , Rfl:  .  omeprazole (PRILOSEC) 20 MG capsule, Take 1 capsule (20 mg total) by mouth daily., Disp: 30 capsule, Rfl: 3  Allergies  Allergen Reactions  .  Morphine Nausea Only and Nausea And Vomiting  . Morphine And Related Nausea And Vomiting    Review of Systems  Constitutional: Positive for malaise/fatigue. Negative for fever and chills.  HENT: Positive for congestion (frequent sinus infections.) and sore throat.   Eyes: Negative for blurred vision and double vision.  Respiratory: Positive for cough (chronic cough), sputum production, shortness of breath and wheezing.   Cardiovascular: Negative for chest pain, palpitations and leg swelling.  Gastrointestinal: Positive for diarrhea. Negative for heartburn (controlled on medication.), nausea, vomiting, abdominal pain, constipation and blood in stool.  Genitourinary: Negative for dysuria and hematuria.  Musculoskeletal: Positive for back pain (chronic low back pain). Negative for joint  pain.  Neurological: Positive for headaches (frequent headaches, usually resolve with OTC pain relievers.). Negative for dizziness.  Psychiatric/Behavioral: Negative for depression. The patient is nervous/anxious.     Objective  Filed Vitals:   07/12/15 0900  BP: 136/80  Pulse: 109  Temp: 98.3 F (36.8 C)  TempSrc: Oral  Resp: 18  Height: 5\' 11"  (1.803 m)  Weight: 158 lb 11.2 oz (71.986 kg)  SpO2: 96%    Physical Exam  Constitutional: She is oriented to person, place, and time and well-developed, well-nourished, and in no distress.  HENT:  Head: Normocephalic and atraumatic.  Right Ear: Tympanic membrane normal.  Mouth/Throat: No posterior oropharyngeal erythema.  Right ear canal with cerumen impaction.  Eyes: Pupils are equal, round, and reactive to light.  Cardiovascular: Normal rate and regular rhythm.   Pulmonary/Chest: Effort normal and breath sounds normal. Right breast exhibits mass. Right breast exhibits no nipple discharge and no skin change. Left breast exhibits no mass, no nipple discharge and no skin change.    Round well-defined mass on the right breast at 12 o' clock  Abdominal: Soft. Bowel sounds are normal. There is no tenderness.  Genitourinary:  Deferred.  Neurological: She is alert and oriented to person, place, and time.  Psychiatric: Mood, memory, affect and judgment normal.  Nursing note and vitals reviewed.     Assessment & Plan  1. Well woman exam Obtain age appropriate laboratories screening. - Vitamin D (25 hydroxy) - TSH  2. Breast mass, right She will need diagnostic mammogram for evaluation of palpable mass at 12:00 on the right breast just superior to the nipple. - MM Digital Diagnostic Bilat; Future - MM Digital Screening; Future  3. Tobacco abuse Has quit in the past, does not wish to stop smoking at this time. Low-dose chest CT scan for screening of lung cancer. - CT CHEST LUNG CA SCREEN LOW DOSE W/O CM; Future  4. Screening  for osteoporosis  - DG Bone Density; Future   Francille Wittmann Asad A. [facility] 07/12/2015 9:04 AM

## 2015-07-25 DIAGNOSIS — Z0000 Encounter for general adult medical examination without abnormal findings: Secondary | ICD-10-CM | POA: Diagnosis not present

## 2015-07-26 LAB — TSH: TSH: 4.17 u[IU]/mL (ref 0.450–4.500)

## 2015-07-26 LAB — VITAMIN D 25 HYDROXY (VIT D DEFICIENCY, FRACTURES): Vit D, 25-Hydroxy: 18 ng/mL — ABNORMAL LOW (ref 30.0–100.0)

## 2015-08-01 ENCOUNTER — Telehealth: Payer: Self-pay

## 2015-08-01 MED ORDER — VITAMIN D (ERGOCALCIFEROL) 1.25 MG (50000 UNIT) PO CAPS: 50000.0000 [IU] | ORAL_CAPSULE | ORAL | Status: DC

## 2015-08-01 NOTE — Telephone Encounter (Signed)
Lab results have been reported to patient and a prescription for Vitamin D 50,000 units 1 capsule once a week for 12 weeks sent to [facility] Walters per Dr. Manuella Ghazi, patient has been notified and verbalized understanding

## 2015-08-15 ENCOUNTER — Ambulatory Visit (INDEPENDENT_AMBULATORY_CARE_PROVIDER_SITE_OTHER): Payer: Medicare Other | Admitting: Family Medicine

## 2015-08-15 ENCOUNTER — Encounter: Payer: Self-pay | Admitting: Family Medicine

## 2015-08-15 VITALS — BP 137/73 | HR 106 | Temp 98.1°F | Resp 18 | Ht 71.0 in | Wt 160.5 lb

## 2015-08-15 DIAGNOSIS — R202 Paresthesia of skin: Secondary | ICD-10-CM | POA: Insufficient documentation

## 2015-08-15 MED ORDER — GABAPENTIN 100 MG PO CAPS: 100.0000 mg | ORAL_CAPSULE | Freq: Three times a day (TID) | ORAL | Status: DC

## 2015-08-15 NOTE — Progress Notes (Signed)
Name: Kristie Walters   MRN: [id number]    DOB: Sep 29, 1945   Date:08/15/2015       Progress Note  Subjective  Chief Complaint  Chief Complaint  Patient presents with  . Follow-up    63mo    HPI  Feet Paresthesias:  Pt. Presents for a long-standing history of burning and stinging on her feet, feels as if she is experiencing pins and needles sensation and burning sensation. This is worse at night and she cannot get comfortable. No alleviating factors and no associated symptoms.   Past Medical History  Diagnosis Date  . Collagen vascular disease ([place])     Left carotid stenosis  . COPD (chronic obstructive pulmonary disease) ([place])   . GERD (gastroesophageal reflux disease)   . PONV (postoperative nausea and vomiting)   . Carotid artery disease ([place])     s/p L. CEA in July 2016  . Elevated blood pressure (not hypertension)     Past Surgical History  Procedure Laterality Date  . Peripheral vascular catheterization N/A 07/20/2014    Procedure: Carotid Angiography;  Surgeon: Algernon Huxley, MD;  Location: [facility];  Service: Cardiovascular;  Laterality: N/A;  . Tonsillectomy    . Mohs surgery    . Endarterectomy Left 08/25/2014    Procedure: ENDARTERECTOMY CAROTID;  Surgeon: Algernon Huxley, MD;  Location: [facility];  Service: Vascular;  Laterality: Left;  . Tubal ligation      Family History  Problem Relation Age of Onset  . Heart attack Mother   . Hypercholesterolemia Mother   . Hypertension Mother   . Peripheral vascular disease Mother   . Dementia Mother   . Hypothyroidism Mother   . CVA Father   . Liver cancer Father   . Diabetes Brother     Social History   Social History  . Marital Status: Divorced    Spouse Name: N/A  . Number of Children: N/A  . Years of Education: N/A   Occupational History  . Not on file.   Social History Main Topics  . Smoking status: Current Every Day Smoker -- 1.00 packs/day    Types: Cigarettes  . Smokeless tobacco: Current User   . Alcohol Use: No  . Drug Use: No  . Sexual Activity: Yes    Birth Control/ Protection: Post-menopausal   Other Topics Concern  . Not on file   Social History Narrative     Current outpatient prescriptions:  .  albuterol (PROVENTIL HFA;VENTOLIN HFA) 108 (90 BASE) MCG/ACT inhaler, Inhale 2 puffs into the lungs every 6 (six) hours as needed for wheezing or shortness of breath., Disp: , Rfl:  .  aspirin 81 MG tablet, Take 81 mg by mouth daily., Disp: , Rfl:  .  atorvastatin (LIPITOR) 10 MG tablet, Take 10 mg by mouth daily., Disp: , Rfl:  .  cetirizine (ZYRTEC) 10 MG tablet, Take 10 mg by mouth daily., Disp: , Rfl:  .  Fish Oil-Cholecalciferol (FISH OIL + D3 PO), Take 1,000 mg by mouth 1 day or 1 dose., Disp: , Rfl:  .  omeprazole (PRILOSEC) 20 MG capsule, Take 1 capsule (20 mg total) by mouth daily., Disp: 30 capsule, Rfl: 3 .  Vitamin D, Ergocalciferol, (DRISDOL) 50000 units CAPS capsule, Take 1 capsule (50,000 Units total) by mouth once a week. For 12 weeks, Disp: 12 capsule, Rfl: 0  Allergies  Allergen Reactions  . Morphine Nausea Only and Nausea And Vomiting  . Morphine And Related Nausea And  Vomiting     Review of Systems  Constitutional: Negative for fever and chills.  Neurological: Positive for tingling.     Objective  Filed Vitals:   08/15/15 0919  BP: 137/73  Pulse: 106  Temp: 98.1 F (36.7 C)  TempSrc: Oral  Resp: 18  Height: 5\' 11"  (1.803 m)  Weight: 160 lb 8 oz (72.802 kg)  SpO2: 96%    Physical Exam  Constitutional: She is well-developed, well-nourished, and in no distress.  Cardiovascular: Normal rate, regular rhythm, S1 normal and S2 normal.   Pulmonary/Chest: Effort normal and breath sounds normal. She has no wheezes. She has no rhonchi.  Musculoskeletal:       Right ankle: She exhibits no swelling.       Left ankle: She exhibits no swelling.       Left foot: There is no tenderness and no swelling.  Normal monofilament exam on both feet,  subjective feeling of burning on her feet, normal Dorsalis Pedis pulse, normal ROM.  Nursing note and vitals reviewed.    Assessment & Plan  1. Paresthesia of foot, bilateral Unclear etiology, obtain laboratory evaluation. Start on  low-dose gabapentin  - B12 - Antinuclear Antib (ANA) - gabapentin (NEURONTIN) 100 MG capsule; Take 1 capsule (100 mg total) by mouth 3 (three) times daily.  Dispense: 90 capsule; Refill: 0    Rossi Silvestro [facility] 08/15/2015 9:55 AM

## 2015-08-21 DIAGNOSIS — R202 Paresthesia of skin: Secondary | ICD-10-CM | POA: Diagnosis not present

## 2015-08-23 ENCOUNTER — Telehealth: Payer: Self-pay | Admitting: *Deleted

## 2015-08-23 ENCOUNTER — Other Ambulatory Visit: Payer: Self-pay | Admitting: Family Medicine

## 2015-08-23 LAB — VITAMIN B12: Vitamin B-12: 281 pg/mL (ref 211–946)

## 2015-08-23 LAB — ANA: ANA: NEGATIVE

## 2015-08-23 LAB — TSH: TSH: 5.32 u[IU]/mL — AB (ref 0.450–4.500)

## 2015-08-23 NOTE — Telephone Encounter (Signed)
Received referral for initial lung cancer screening scan. Contacted patient and obtained smoking history,(current, 55 pack year) as well as answering questions related to screening process. Patient denies signs of lung cancer such as weight loss or hemoptysis. Patient denies comorbidity that would prevent curative treatment if lung cancer were found. Patient is tentatively scheduled for shared decision making visit and CT scan on 09/01/15 at 2pm, pending insurance approval from business office.

## 2015-08-31 ENCOUNTER — Encounter: Payer: Self-pay | Admitting: Family Medicine

## 2015-08-31 ENCOUNTER — Other Ambulatory Visit: Payer: Self-pay | Admitting: Family Medicine

## 2015-08-31 DIAGNOSIS — Z87891 Personal history of nicotine dependence: Secondary | ICD-10-CM

## 2015-08-31 HISTORY — DX: Personal history of nicotine dependence: Z87.891

## 2015-09-01 ENCOUNTER — Ambulatory Visit
Admission: RE | Admit: 2015-09-01 | Discharge: 2015-09-01 | Disposition: A | Discharge status: Home / Self Care | Payer: Medicare Other | Source: Ambulatory Visit | Attending: Family Medicine | Admitting: Family Medicine

## 2015-09-01 ENCOUNTER — Inpatient Hospital Stay: Payer: Medicare Other | Attending: Family Medicine | Admitting: Family Medicine

## 2015-09-01 ENCOUNTER — Encounter: Payer: Self-pay | Admitting: Family Medicine

## 2015-09-01 DIAGNOSIS — I700 Atherosclerosis of aorta: Secondary | ICD-10-CM | POA: Diagnosis not present

## 2015-09-01 DIAGNOSIS — I2510 Atherosclerotic heart disease of native coronary artery without angina pectoris: Secondary | ICD-10-CM | POA: Insufficient documentation

## 2015-09-01 DIAGNOSIS — J439 Emphysema, unspecified: Secondary | ICD-10-CM | POA: Insufficient documentation

## 2015-09-01 DIAGNOSIS — Z87891 Personal history of nicotine dependence: Secondary | ICD-10-CM | POA: Insufficient documentation

## 2015-09-01 DIAGNOSIS — F17210 Nicotine dependence, cigarettes, uncomplicated: Secondary | ICD-10-CM | POA: Diagnosis not present

## 2015-09-01 DIAGNOSIS — Z122 Encounter for screening for malignant neoplasm of respiratory organs: Secondary | ICD-10-CM | POA: Diagnosis not present

## 2015-09-01 DIAGNOSIS — N6489 Other specified disorders of breast: Secondary | ICD-10-CM | POA: Diagnosis not present

## 2015-09-01 NOTE — Progress Notes (Signed)
In accordance with CMS guidelines, patient has meet eligibility criteria including age, absence of signs or symptoms of lung cancer, the specific calculation of cigarette smoking pack-years was 55 years and is a current smoker.   A shared decision-making session was conducted prior to the performance of CT scan. This includes one or more decision aids, includes benefits and harms of screening, follow-up diagnostic testing, over-diagnosis, false positive rate, and total radiation exposure.  Counseling on the importance of adherence to annual lung cancer LDCT screening, impact of co-morbidities, and ability or willingness to undergo diagnosis and treatment is imperative for compliance of the program.  Counseling on the importance of continued smoking cessation for former smokers; the importance of smoking cessation for current smokers and information about tobacco cessation interventions have been given to patient including the  at [facility], 1800 quit [place], as well as Cancer Center specific smoking cessation programs.  Written order for lung cancer screening with LDCT has been given to the patient and any and all questions have been answered to the best of my abilities.   Yearly follow up will be scheduled by Shawn Perkins, Thoracic Navigator.   

## 2015-09-04 ENCOUNTER — Telehealth: Payer: Self-pay | Admitting: *Deleted

## 2015-09-04 NOTE — Telephone Encounter (Signed)
Notified patient of LDCT lung cancer screening results with recommendation for 12 month follow up imaging. Also notified of incidental finding noted below. Patient verbalizes understanding. Patient reports MD is setting up mammogram in near future and she will call to inquire.   IMPRESSION: 1. Lung-RADS Category 2, benign appearance or behavior. Continue annual screening with low-dose chest CT without contrast in 12 months. 2. The "S" modifier above refers to potentially clinically significant non lung cancer related findings. Specifically, there is aortic atherosclerosis, in addition to left main and 3 vessel coronary artery disease. Please note that although the presence of coronary artery calcium documents the presence of coronary artery disease, the severity of this disease and any potential stenosis cannot be assessed on this non-gated CT examination. Assessment for potential risk factor modification, dietary therapy or pharmacologic therapy may be warranted, if clinically indicated. 3. Well-circumscribed 2.3 x 2.3 x 2.4 cm low-attenuation lesion in the central aspect of the right breast, favored to represent a cyst, but incompletely characterized on today's noncontrast CT examination. Correlation with mammography and/or breast ultrasound is recommended in the near future. 4. Diffuse bronchial wall thickening with mild centrilobular and paraseptal emphysema; imaging findings suggestive of underlying COPD. 5. Additional incidental findings, as above. These results will be called to the ordering clinician or representative by the Radiologist Assistant, and communication documented in the PACS or zVision Dashboard.

## 2015-09-11 ENCOUNTER — Other Ambulatory Visit: Payer: Self-pay | Admitting: Family Medicine

## 2015-09-11 DIAGNOSIS — R202 Paresthesia of skin: Secondary | ICD-10-CM

## 2015-09-14 ENCOUNTER — Ambulatory Visit (INDEPENDENT_AMBULATORY_CARE_PROVIDER_SITE_OTHER): Payer: Medicare Other | Admitting: Family Medicine

## 2015-09-14 ENCOUNTER — Encounter: Payer: Self-pay | Admitting: Family Medicine

## 2015-09-14 VITALS — BP 130/80 | HR 91 | Temp 98.3°F | Resp 16 | Ht 71.0 in | Wt 160.1 lb

## 2015-09-14 DIAGNOSIS — N63 Unspecified lump in breast: Secondary | ICD-10-CM | POA: Diagnosis not present

## 2015-09-14 DIAGNOSIS — I700 Atherosclerosis of aorta: Secondary | ICD-10-CM | POA: Diagnosis not present

## 2015-09-14 DIAGNOSIS — N6310 Unspecified lump in the right breast, unspecified quadrant: Secondary | ICD-10-CM

## 2015-09-14 NOTE — Progress Notes (Signed)
Name: Kristie Walters   MRN: [id number]    DOB: May 02, 1945   Date:09/14/2015       Progress Note  Subjective  Chief Complaint  Chief Complaint  Patient presents with  . Follow-up    CT Scan     HPI  Pt. Presents for evaluation and discussion of CT Scan of Lung findings. CT Scan of lungs ordered to screen for lung cancer. It shows two important findings as listed below;  Coronary Artery Atherosclerosis: Atherosclerosis of aortic as well as coronary arteries, she has never been diagnosed with Coronary artery disease but had left carotid endarterectomy last year. She denies any pertinent symptoms, will be referred to Cardiology.   Right Breast Mass: Right breast mass measuring 2.3 x 2.3 x 2.4 cm, favored to represent a cyst. She is still awaiting an appointment for diagnostic mammogram. Will add diagnostic ultrasound today. She denies any pain in the right breast or nipple discharge.  Past Medical History:  Diagnosis Date  . Carotid artery disease ([place])    s/p L. CEA in July 2016  . Collagen vascular disease ([place])    Left carotid stenosis  . COPD (chronic obstructive pulmonary disease) ([place])   . Elevated blood pressure (not hypertension)   . GERD (gastroesophageal reflux disease)   . Personal history of tobacco use, presenting hazards to health 08/31/2015  . PONV (postoperative nausea and vomiting)     Past Surgical History:  Procedure Laterality Date  . ENDARTERECTOMY Left 08/25/2014   Procedure: ENDARTERECTOMY CAROTID;  Surgeon: Algernon Huxley, MD;  Location: [facility];  Service: Vascular;  Laterality: Left;  . MOHS SURGERY    . PERIPHERAL VASCULAR CATHETERIZATION N/A 07/20/2014   Procedure: Carotid Angiography;  Surgeon: Algernon Huxley, MD;  Location: [facility];  Service: Cardiovascular;  Laterality: N/A;  . TONSILLECTOMY    . TUBAL LIGATION      Family History  Problem Relation Age of Onset  . Heart attack Mother   . Hypercholesterolemia Mother   . Hypertension Mother    . Peripheral vascular disease Mother   . Dementia Mother   . Hypothyroidism Mother   . CVA Father   . Liver cancer Father   . Diabetes Brother     Social History   Social History  . Marital status: Divorced    Spouse name: N/A  . Number of children: N/A  . Years of education: N/A   Occupational History  . Not on file.   Social History Main Topics  . Smoking status: Current Every Day Smoker    Packs/day: 1.00    Years: 55.00    Types: Cigarettes  . Smokeless tobacco: Current User  . Alcohol use No  . Drug use: No  . Sexual activity: Yes    Birth control/ protection: Post-menopausal   Other Topics Concern  . Not on file   Social History Narrative  . No narrative on file     Current Outpatient Prescriptions:  .  albuterol (PROVENTIL HFA;VENTOLIN HFA) 108 (90 BASE) MCG/ACT inhaler, Inhale 2 puffs into the lungs every 6 (six) hours as needed for wheezing or shortness of breath., Disp: , Rfl:  .  aspirin 81 MG tablet, Take 81 mg by mouth daily., Disp: , Rfl:  .  atorvastatin (LIPITOR) 10 MG tablet, Take 10 mg by mouth daily., Disp: , Rfl:  .  cetirizine (ZYRTEC) 10 MG tablet, Take 10 mg by mouth daily., Disp: , Rfl:  .  Fish Oil-Cholecalciferol (FISH  OIL + D3 PO), Take 1,000 mg by mouth 1 day or 1 dose., Disp: , Rfl:  .  omeprazole (PRILOSEC) 20 MG capsule, TAKE 1 CAPSULE (20 MG TOTAL) BY MOUTH DAILY., Disp: 30 capsule, Rfl: 3 .  Vitamin D, Ergocalciferol, (DRISDOL) 50000 units CAPS capsule, Take 1 capsule (50,000 Units total) by mouth once a week. For 12 weeks, Disp: 12 capsule, Rfl: 0 .  gabapentin (NEURONTIN) 100 MG capsule, Take 1 capsule (100 mg total) by mouth 3 (three) times daily. (Patient not taking: Reported on 09/14/2015), Disp: 90 capsule, Rfl: 0  Allergies  Allergen Reactions  . Morphine Nausea Only and Nausea And Vomiting  . Morphine And Related Nausea And Vomiting     Review of Systems  Constitutional: Negative for chills, fever, malaise/fatigue and  weight loss.  Cardiovascular: Positive for palpitations (intermittent palpitations.). Negative for chest pain.  Neurological: Negative for dizziness.    Objective  Vitals:   09/14/15 0937  BP: 130/80  Pulse: 91  Resp: 16  Temp: 98.3 F (36.8 C)  TempSrc: Oral  SpO2: 95%  Weight: 160 lb 1.6 oz (72.6 kg)  Height: 5\' 11"  (1.803 m)    Physical Exam  Constitutional: She is oriented to person, place, and time and well-developed, well-nourished, and in no distress.  HENT:  Head: Normocephalic and atraumatic.  Neurological: She is alert and oriented to person, place, and time.  Psychiatric: Mood, memory, affect and judgment normal.  Nursing note and vitals reviewed.      Assessment & Plan  1. Aortic atherosclerosis (HCC) History of carotid artery disease, status post carotid endarterectomy. Calcified atherosclerotic plaque in left main and left anterior descending coronary arteries, will be referred to cardiology for management - Ambulatory referral to Cardiology  2. Breast mass, right Patient will need a right breast ultrasound and diagnostic mammogram for evaluation of a probable cystic mass - US BREAST LTD UNI LEFT INC AXILLA; Future - US BREAST LTD UNI RIGHT INC AXILLA; Future  Meiling Hendriks [facility] 09/14/2015 9:50 AM

## 2015-09-19 ENCOUNTER — Telehealth: Payer: Self-pay | Admitting: Emergency Medicine

## 2015-09-20 DIAGNOSIS — R011 Cardiac murmur, unspecified: Secondary | ICD-10-CM | POA: Diagnosis not present

## 2015-09-20 DIAGNOSIS — I208 Other forms of angina pectoris: Secondary | ICD-10-CM | POA: Diagnosis not present

## 2015-09-20 DIAGNOSIS — I739 Peripheral vascular disease, unspecified: Secondary | ICD-10-CM | POA: Diagnosis not present

## 2015-09-20 DIAGNOSIS — I2510 Atherosclerotic heart disease of native coronary artery without angina pectoris: Secondary | ICD-10-CM | POA: Diagnosis not present

## 2015-09-21 ENCOUNTER — Ambulatory Visit
Admission: RE | Admit: 2015-09-21 | Discharge: 2015-09-21 | Disposition: A | Discharge status: Home / Self Care | Payer: Medicare Other | Source: Ambulatory Visit | Attending: Family Medicine | Admitting: Family Medicine

## 2015-09-21 DIAGNOSIS — R932 Abnormal findings on diagnostic imaging of liver and biliary tract: Secondary | ICD-10-CM | POA: Diagnosis not present

## 2015-09-21 DIAGNOSIS — R945 Abnormal results of liver function studies: Secondary | ICD-10-CM | POA: Diagnosis not present

## 2015-09-21 DIAGNOSIS — R748 Abnormal levels of other serum enzymes: Secondary | ICD-10-CM | POA: Insufficient documentation

## 2015-09-29 ENCOUNTER — Ambulatory Visit
Admission: RE | Admit: 2015-09-29 | Discharge: 2015-09-29 | Disposition: A | Discharge status: Home / Self Care | Payer: Medicare Other | Source: Ambulatory Visit | Attending: Family Medicine | Admitting: Family Medicine

## 2015-09-29 ENCOUNTER — Other Ambulatory Visit: Payer: Self-pay | Admitting: Family Medicine

## 2015-09-29 DIAGNOSIS — N63 Unspecified lump in breast: Secondary | ICD-10-CM | POA: Diagnosis not present

## 2015-09-29 DIAGNOSIS — N6310 Unspecified lump in the right breast, unspecified quadrant: Secondary | ICD-10-CM

## 2015-09-29 DIAGNOSIS — N6001 Solitary cyst of right breast: Secondary | ICD-10-CM | POA: Insufficient documentation

## 2015-10-04 NOTE — Telephone Encounter (Signed)
Patient notified

## 2015-10-10 ENCOUNTER — Ambulatory Visit: Payer: Medicare Other | Admitting: Cardiology

## 2015-10-11 DIAGNOSIS — I2510 Atherosclerotic heart disease of native coronary artery without angina pectoris: Secondary | ICD-10-CM | POA: Diagnosis not present

## 2015-10-11 DIAGNOSIS — R011 Cardiac murmur, unspecified: Secondary | ICD-10-CM | POA: Diagnosis not present

## 2015-10-11 DIAGNOSIS — I208 Other forms of angina pectoris: Secondary | ICD-10-CM | POA: Diagnosis not present

## 2015-10-19 DIAGNOSIS — I208 Other forms of angina pectoris: Secondary | ICD-10-CM | POA: Diagnosis not present

## 2015-10-19 DIAGNOSIS — R002 Palpitations: Secondary | ICD-10-CM | POA: Diagnosis not present

## 2015-10-19 DIAGNOSIS — R011 Cardiac murmur, unspecified: Secondary | ICD-10-CM | POA: Diagnosis not present

## 2015-10-19 DIAGNOSIS — I2510 Atherosclerotic heart disease of native coronary artery without angina pectoris: Secondary | ICD-10-CM | POA: Diagnosis not present

## 2015-10-22 ENCOUNTER — Other Ambulatory Visit: Payer: Self-pay | Admitting: Family Medicine

## 2015-12-20 ENCOUNTER — Other Ambulatory Visit: Payer: Self-pay | Admitting: Family Medicine

## 2016-02-27 ENCOUNTER — Ambulatory Visit (INDEPENDENT_AMBULATORY_CARE_PROVIDER_SITE_OTHER): Payer: Medicare Other | Admitting: Family Medicine

## 2016-02-27 ENCOUNTER — Encounter: Payer: Self-pay | Admitting: Family Medicine

## 2016-02-27 VITALS — BP 128/64 | HR 115 | Temp 98.9°F | Resp 17 | Ht 71.0 in | Wt 158.5 lb

## 2016-02-27 DIAGNOSIS — J0100 Acute maxillary sinusitis, unspecified: Secondary | ICD-10-CM | POA: Diagnosis not present

## 2016-02-27 DIAGNOSIS — J449 Chronic obstructive pulmonary disease, unspecified: Secondary | ICD-10-CM

## 2016-02-27 MED ORDER — ALBUTEROL SULFATE HFA 108 (90 BASE) MCG/ACT IN AERS: 2.0000 | INHALATION_SPRAY | Freq: Four times a day (QID) | RESPIRATORY_TRACT | 1 refills | Status: DC | PRN

## 2016-02-27 MED ORDER — AZITHROMYCIN 250 MG PO TABS: ORAL_TABLET | ORAL | 0 refills | Status: DC

## 2016-02-27 NOTE — Progress Notes (Signed)
Name: Kristie Walters   MRN: [id number]    DOB: 04/13/1945   Date:02/27/2016       Progress Note  Subjective  Chief Complaint  Chief Complaint  Patient presents with  . Sinus Problem    possible sinus infection x1 week    Sinus Problem  This is a new problem. The current episode started in the past 7 days. The problem is unchanged. There has been no fever. Associated symptoms include sinus pressure and a sore throat. Pertinent negatives include no congestion, coughing, ear pain or sneezing. Past treatments include oral decongestants (has taken Claritin D without relief.).    Past Medical History:  Diagnosis Date  . Carotid artery disease ([place])    s/p L. CEA in July 2016  . Collagen vascular disease ([place])    Left carotid stenosis  . COPD (chronic obstructive pulmonary disease) ([place])   . Elevated blood pressure (not hypertension)   . GERD (gastroesophageal reflux disease)   . Personal history of tobacco use, presenting hazards to health 08/31/2015  . PONV (postoperative nausea and vomiting)     Past Surgical History:  Procedure Laterality Date  . ENDARTERECTOMY Left 08/25/2014   Procedure: ENDARTERECTOMY CAROTID;  Surgeon: Algernon Huxley, MD;  Location: [facility];  Service: Vascular;  Laterality: Left;  . MOHS SURGERY    . PERIPHERAL VASCULAR CATHETERIZATION N/A 07/20/2014   Procedure: Carotid Angiography;  Surgeon: Algernon Huxley, MD;  Location: [facility];  Service: Cardiovascular;  Laterality: N/A;  . TONSILLECTOMY    . TUBAL LIGATION      Family History  Problem Relation Age of Onset  . Heart attack Mother   . Hypercholesterolemia Mother   . Hypertension Mother   . Peripheral vascular disease Mother   . Dementia Mother   . Hypothyroidism Mother   . CVA Father   . Liver cancer Father   . Diabetes Brother     Social History   Social History  . Marital status: Divorced    Spouse name: N/A  . Number of children: N/A  . Years of education: N/A   Occupational  History  . Not on file.   Social History Main Topics  . Smoking status: Current Every Day Smoker    Packs/day: 1.00    Years: 55.00    Types: Cigarettes  . Smokeless tobacco: Current User  . Alcohol use No  . Drug use: No  . Sexual activity: Yes    Birth control/ protection: Post-menopausal   Other Topics Concern  . Not on file   Social History Narrative  . No narrative on file     Current Outpatient Prescriptions:  .  albuterol (PROVENTIL HFA;VENTOLIN HFA) 108 (90 BASE) MCG/ACT inhaler, Inhale 2 puffs into the lungs every 6 (six) hours as needed for wheezing or shortness of breath., Disp: , Rfl:  .  aspirin 81 MG tablet, Take 81 mg by mouth daily., Disp: , Rfl:  .  atorvastatin (LIPITOR) 10 MG tablet, Take 10 mg by mouth daily., Disp: , Rfl:  .  Fish Oil-Cholecalciferol (FISH OIL + D3 PO), Take 1,000 mg by mouth 1 day or 1 dose., Disp: , Rfl:  .  omeprazole (PRILOSEC) 20 MG capsule, TAKE 1 CAPSULE (20 MG TOTAL) BY MOUTH DAILY., Disp: 30 capsule, Rfl: 3  Allergies  Allergen Reactions  . Morphine Nausea Only and Nausea And Vomiting  . Morphine And Related Nausea And Vomiting     Review of Systems  HENT: Positive  for sinus pressure and sore throat. Negative for congestion, ear pain and sneezing.   Respiratory: Negative for cough.     Objective  Vitals:   02/27/16 0930  BP: 128/64  Pulse: (!) 115  Resp: 17  Temp: 98.9 F (37.2 C)  TempSrc: Oral  SpO2: 96%  Weight: 158 lb 8 oz (71.9 kg)  Height: 5\' 11"  (1.803 m)    Physical Exam  Constitutional: She is oriented to person, place, and time and well-developed, well-nourished, and in no distress.  HENT:  Left Ear: Tympanic membrane and ear canal normal.  Nose: Right sinus exhibits maxillary sinus tenderness. Right sinus exhibits no frontal sinus tenderness. Left sinus exhibits maxillary sinus tenderness. Left sinus exhibits no frontal sinus tenderness.  Mouth/Throat: No posterior oropharyngeal erythema.  Right  ear canal with cerumen impaction.  Cardiovascular: Normal rate, regular rhythm, S1 normal, S2 normal and normal heart sounds.   No murmur heard. Pulmonary/Chest: Effort normal and breath sounds normal. She has no wheezes.  Neurological: She is alert and oriented to person, place, and time.  Psychiatric: Mood, memory, affect and judgment normal.  Nursing note and vitals reviewed.      Assessment & Plan  1. Acute non-recurrent maxillary sinusitis  - azithromycin (ZITHROMAX) 250 MG tablet; 2 tabs po day 1, then 1 tab po q day x 4 days  Dispense: 6 each; Refill: 0  2. Chronic obstructive pulmonary disease, unspecified COPD type (HCC)  - albuterol (PROVENTIL HFA;VENTOLIN HFA) 108 (90 Base) MCG/ACT inhaler; Inhale 2 puffs into the lungs every 6 (six) hours as needed for wheezing or shortness of breath.  Dispense: 2 Inhaler; Refill: 1   Nesta Kimple [facility] 02/27/2016 9:48 AM

## 2016-03-18 ENCOUNTER — Telehealth: Payer: Self-pay | Admitting: Family Medicine

## 2016-03-18 NOTE — Telephone Encounter (Signed)
Called Pt to schedule AWV with [facility] - knb °

## 2016-04-24 ENCOUNTER — Other Ambulatory Visit: Payer: Self-pay | Admitting: Family Medicine

## 2016-06-21 ENCOUNTER — Encounter (INDEPENDENT_AMBULATORY_CARE_PROVIDER_SITE_OTHER): Payer: Self-pay

## 2016-06-21 ENCOUNTER — Ambulatory Visit (INDEPENDENT_AMBULATORY_CARE_PROVIDER_SITE_OTHER): Payer: Self-pay | Admitting: Vascular Surgery

## 2016-07-12 ENCOUNTER — Ambulatory Visit (INDEPENDENT_AMBULATORY_CARE_PROVIDER_SITE_OTHER): Payer: Medicare Other | Admitting: Vascular Surgery

## 2016-07-12 ENCOUNTER — Other Ambulatory Visit (INDEPENDENT_AMBULATORY_CARE_PROVIDER_SITE_OTHER): Payer: Medicare Other

## 2016-07-12 ENCOUNTER — Other Ambulatory Visit (INDEPENDENT_AMBULATORY_CARE_PROVIDER_SITE_OTHER): Payer: Self-pay | Admitting: Vascular Surgery

## 2016-07-12 ENCOUNTER — Encounter (INDEPENDENT_AMBULATORY_CARE_PROVIDER_SITE_OTHER): Payer: Self-pay | Admitting: Vascular Surgery

## 2016-07-12 VITALS — BP 122/70 | HR 85 | Resp 16 | Wt 158.0 lb

## 2016-07-12 DIAGNOSIS — I6523 Occlusion and stenosis of bilateral carotid arteries: Secondary | ICD-10-CM

## 2016-07-12 DIAGNOSIS — E785 Hyperlipidemia, unspecified: Secondary | ICD-10-CM

## 2016-07-12 NOTE — Assessment & Plan Note (Signed)
Her carotid duplex today reveals a widely patent left carotid endarterectomy and stable stenosis in the 1-39% range on the right. Continue aspirin and statin agent. Recheck in 1 year.

## 2016-07-12 NOTE — Progress Notes (Signed)
MRN : [id number]  Kristie Walters is a 71 y.o. (1945-11-30) female who presents with chief complaint of  Chief Complaint  Patient presents with  . Follow-up  .  History of Present Illness: Patient returns in follow-up of her carotid disease. She is about 2 years status post left carotid endarterectomy. He is doing well. She denies focal neurologic symptoms. Specifically, the patient denies amaurosis fugax, speech or swallowing difficulties, or arm or leg weakness or numbness. Her carotid duplex today reveals a widely patent left carotid endarterectomy and stable stenosis in the 1-39% range on the right.   Current Outpatient Prescriptions  Medication Sig Dispense Refill  . albuterol (PROVENTIL HFA;VENTOLIN HFA) 108 (90 Base) MCG/ACT inhaler Inhale 2 puffs into the lungs every 6 (six) hours as needed for wheezing or shortness of breath. 2 Inhaler 1  . aspirin 81 MG tablet Take 81 mg by mouth daily.    [name] atorvastatin (LIPITOR) 10 MG tablet Take 10 mg by mouth daily.    . B Complex-C (B-COMPLEX WITH VITAMIN C) tablet Take 1 tablet by mouth daily.    [name] Fish Oil-Cholecalciferol (FISH OIL + D3 PO) Take 1,000 mg by mouth 1 day or 1 dose.    [name] omeprazole (PRILOSEC) 20 MG capsule TAKE 1 CAPSULE (20 MG TOTAL) BY MOUTH DAILY. 30 capsule 3  . azithromycin (ZITHROMAX) 250 MG tablet 2 tabs po day 1, then 1 tab po q day x 4 days (Patient not taking: Reported on 07/12/2016) 6 each 0   No current facility-administered medications for this visit.     Past Medical History:  Diagnosis Date  . Carotid artery disease ([place])    s/p L. CEA in July 2016  . Collagen vascular disease ([place])    Left carotid stenosis  . COPD (chronic obstructive pulmonary disease) ([place])   . Elevated blood pressure (not hypertension)   . GERD (gastroesophageal reflux disease)   . Personal history of tobacco use, presenting hazards to health 08/31/2015  . PONV (postoperative nausea and vomiting)     Past Surgical History:    Procedure Laterality Date  . ENDARTERECTOMY Left 08/25/2014   Procedure: ENDARTERECTOMY CAROTID;  Surgeon: Algernon Huxley, MD;  Location: [facility] ORS;  Service: Vascular;  Laterality: Left;  . MOHS SURGERY    . PERIPHERAL VASCULAR CATHETERIZATION N/A 07/20/2014   Procedure: Carotid Angiography;  Surgeon: Algernon Huxley, MD;  Location: [place] [facility];  Service: Cardiovascular;  Laterality: N/A;  . TONSILLECTOMY    . TUBAL LIGATION      Social History Social History  Substance Use Topics  . Smoking status: Current Every Day Smoker    Packs/day: 1.00    Years: 55.00    Types: Cigarettes  . Smokeless tobacco: Current User  . Alcohol use No     Family History Family History  Problem Relation Age of Onset  . Heart attack Mother   . Hypercholesterolemia Mother   . Hypertension Mother   . Peripheral vascular disease Mother   . Dementia Mother   . Hypothyroidism Mother   . CVA Father   . Liver cancer Father   . Diabetes Brother     Allergies  Allergen Reactions  . Morphine Nausea Only and Nausea And Vomiting  . Morphine And Related Nausea And Vomiting     REVIEW OF SYSTEMS (Negative unless checked)  Constitutional: [] Weight loss  [] Fever  [] Chills Cardiac: [] Chest pain   [] Chest pressure   [] Palpitations   [] Shortness of  breath when laying flat   [] Shortness of breath at rest   [x] Shortness of breath with exertion. Vascular:  [] Pain in legs with walking   [] Pain in legs at rest   [] Pain in legs when laying flat   [] Claudication   [] Pain in feet when walking  [] Pain in feet at rest  [] Pain in feet when laying flat   [] History of DVT   [] Phlebitis   [] Swelling in legs   [] Varicose veins   [] Non-healing ulcers Pulmonary:   [] Uses home oxygen   [] Productive cough   [] Hemoptysis   [] Wheeze  [x] COPD   [] Asthma Neurologic:  [] Dizziness  [] Blackouts   [] Seizures   [] History of stroke   [] History of TIA  [] Aphasia   [] Temporary blindness   [] Dysphagia   [] Weakness or numbness in arms    [] Weakness or numbness in legs Musculoskeletal:  [] Arthritis   [] Joint swelling   [] Joint pain   [] Low back pain Hematologic:  [] Easy bruising  [] Easy bleeding   [] Hypercoagulable state   [] Anemic  [] Hepatitis Gastrointestinal:  [] Blood in stool   [] Vomiting blood  [x] Gastroesophageal reflux/heartburn   [] Difficulty swallowing. Genitourinary:  [] Chronic kidney disease   [] Difficult urination  [] Frequent urination  [] Burning with urination   [] Blood in urine Skin:  [] Rashes   [] Ulcers   [] Wounds Psychological:  [] History of anxiety   []  History of major depression.  Physical Examination  Vitals:   07/12/16 1457 07/12/16 1458  BP: 114/71 122/70  Pulse: 85   Resp: 16   Weight: 158 lb (71.7 kg)    Body mass index is 22.04 kg/m. Gen:  WD/WN, NAD Head: [place]/AT, No temporalis wasting. Ear/Nose/Throat: Hearing grossly intact, nares w/o erythema or drainage, trachea midline Eyes: Conjunctiva clear. Sclera non-icteric Neck: Supple.  No bruit or JVD.  Pulmonary:  Good air movement, equal and clear to auscultation bilaterally.  Cardiac: RRR, normal S1, S2, no Murmurs, rubs or gallops. Vascular:  Vessel Right Left  Radial Palpable Palpable                                   Gastrointestinal: soft, non-tender/non-distended. Musculoskeletal: M/S 5/5 throughout.  No deformity or atrophy. No edema. Neurologic: CN 2-12 intact. Sensation grossly intact in extremities.  Symmetrical.  Speech is fluent. Motor exam as listed above. Psychiatric: Judgment intact, Mood & affect appropriate for pt's clinical situation. Dermatologic: No rashes or ulcers noted.  No cellulitis or open wounds.      CBC Lab Results  Component Value Date   WBC 8.4 06/29/2015   HGB 12.3 08/26/2014   HCT 41.8 06/29/2015   MCV 85 06/29/2015   PLT 253 06/29/2015    BMET    Component Value Date/Time   NA 141 06/29/2015 1219   NA 141 02/20/2014 1016   K 4.7 06/29/2015 1219   K 4.0 02/20/2014 1016   CL 100  06/29/2015 1219   CL 106 02/20/2014 1016   CO2 25 06/29/2015 1219   CO2 31 02/20/2014 1016   GLUCOSE 97 06/29/2015 1219   GLUCOSE 144 (H) 08/26/2014 0526   GLUCOSE 92 02/20/2014 1016   BUN 11 06/29/2015 1219   BUN 11 02/20/2014 1016   CREATININE 0.81 06/29/2015 1219   CREATININE 0.95 02/20/2014 1016   CALCIUM 9.6 06/29/2015 1219   CALCIUM 9.2 02/20/2014 1016   GFRNONAA 74 06/29/2015 1219   GFRNONAA >60 02/20/2014 1016   GFRNONAA >60 06/24/2013 0010  GFRAA 85 06/29/2015 1219   GFRAA >60 02/20/2014 1016   GFRAA >60 06/24/2013 0010   CrCl cannot be calculated (Patient's most recent lab result is older than the maximum 21 days allowed.).  COAG Lab Results  Component Value Date   INR 1.0 02/20/2014    Radiology No results found.    Assessment/Plan Hyperlipidemia lipid control important in reducing the progression of atherosclerotic disease. Continue statin therapy   Carotid stenosis Her carotid duplex today reveals a widely patent left carotid endarterectomy and stable stenosis in the 1-39% range on the right. Continue aspirin and statin agent. Recheck in 1 year.    Leotis Pain, MD  07/12/2016 4:23 PM    This note was created with Dragon medical transcription system.  Any errors from dictation are purely unintentional

## 2016-07-12 NOTE — Assessment & Plan Note (Signed)
lipid control important in reducing the progression of atherosclerotic disease. Continue statin therapy  

## 2016-08-28 ENCOUNTER — Telehealth: Payer: Self-pay | Admitting: *Deleted

## 2016-08-28 DIAGNOSIS — Z87891 Personal history of nicotine dependence: Secondary | ICD-10-CM

## 2016-08-28 NOTE — Telephone Encounter (Signed)
Notified patient that annual lung cancer screening low dose CT scan is due currently or will be in near future. Confirmed that patient is within the age range of 55-77, and asymptomatic, (no signs or symptoms of lung cancer). Patient denies illness that would prevent curative treatment for lung cancer if found. Verified smoking history, (current, 56 pack year). The shared decision making visit was done 09/01/15. Patient is agreeable for CT scan being scheduled.

## 2016-09-04 ENCOUNTER — Encounter: Payer: Self-pay | Admitting: Family Medicine

## 2016-09-04 ENCOUNTER — Ambulatory Visit (INDEPENDENT_AMBULATORY_CARE_PROVIDER_SITE_OTHER): Payer: Medicare Other | Admitting: Family Medicine

## 2016-09-04 VITALS — BP 142/70 | HR 100 | Temp 98.9°F | Resp 16 | Ht 71.0 in | Wt 159.4 lb

## 2016-09-04 DIAGNOSIS — J310 Chronic rhinitis: Secondary | ICD-10-CM

## 2016-09-04 DIAGNOSIS — K219 Gastro-esophageal reflux disease without esophagitis: Secondary | ICD-10-CM | POA: Diagnosis not present

## 2016-09-04 DIAGNOSIS — H6121 Impacted cerumen, right ear: Secondary | ICD-10-CM

## 2016-09-04 DIAGNOSIS — R1311 Dysphagia, oral phase: Secondary | ICD-10-CM | POA: Diagnosis not present

## 2016-09-04 DIAGNOSIS — Z720 Tobacco use: Secondary | ICD-10-CM | POA: Diagnosis not present

## 2016-09-04 MED ORDER — FLUTICASONE PROPIONATE 50 MCG/ACT NA SUSP: 2.0000 | Freq: Every day | NASAL | 6 refills | Status: DC

## 2016-09-04 NOTE — Progress Notes (Addendum)
Name: Kristie Walters   MRN: [id number]    DOB: 1945/10/21   Date:09/04/2016       Progress Note  Subjective  Chief Complaint  Chief Complaint  Patient presents with  . URI    ear pain and itching, stuffy, nasal drainage    HPI  - Pt presents with right sided throat pain and sensation of something stuck in throat along with, right ear pain and fullness for several months and bilateral sinus congestion and pressure which has been a chronic issue for her.  No fevers or chills, no cough, some shortness of breath which is baseline for her w/ COPD, no chest pain, no NV. Having some mild diarrhea.  - She called her ENT this morning - has apt for August 2nd with Dr. Virgia Land at [facility], but would like to be seen sooner if possible.  PT is current smoker, has COPD and GERD. Takes prilosec as needed - advised to continue to take this PRN. She is scheduled for Low Dose CT scan of the chest for lung cancer screening on 09/10/2016.  - Family history - mom has hx "thyroid issues" but no thyroid cancer.  No personal history of thyroid cancer.  Patient Active Problem List   Diagnosis Date Noted  . Acute non-recurrent maxillary sinusitis 02/27/2016  . Aortic atherosclerosis ([place]) 09/14/2015  . Personal history of tobacco use, presenting hazards to health 08/31/2015  . Paresthesia of foot, bilateral 08/15/2015  . Well woman exam 07/12/2015  . Breast mass, right 07/12/2015  . Tobacco abuse 07/12/2015  . Screening for osteoporosis 07/12/2015  . Tick bite of groin 06/29/2015  . Elevated alkaline phosphatase level 05/29/2015  . COPD (chronic obstructive pulmonary disease) ([place]) 04/26/2015  . GERD (gastroesophageal reflux disease) 04/26/2015  . Elevated blood pressure (not hypertension) 04/26/2015  . Hyperlipidemia 04/26/2015  . Hyperglycemia 04/26/2015  . Carotid stenosis 08/25/2014  . H/O malignant neoplasm of skin 08/13/2012    Social History  Substance Use Topics  . Smoking status: Current  Every Day Smoker    Packs/day: 1.00    Years: 55.00    Types: Cigarettes  . Smokeless tobacco: Current User  . Alcohol use No     Current Outpatient Prescriptions:  .  albuterol (PROVENTIL HFA;VENTOLIN HFA) 108 (90 Base) MCG/ACT inhaler, Inhale 2 puffs into the lungs every 6 (six) hours as needed for wheezing or shortness of breath., Disp: 2 Inhaler, Rfl: 1 .  aspirin 81 MG tablet, Take 81 mg by mouth daily., Disp: , Rfl:  .  atorvastatin (LIPITOR) 10 MG tablet, Take 10 mg by mouth daily., Disp: , Rfl:  .  B Complex-C (B-COMPLEX WITH VITAMIN C) tablet, Take 1 tablet by mouth daily., Disp: , Rfl:  .  Fish Oil-Cholecalciferol (FISH OIL + D3 PO), Take 1,000 mg by mouth 1 day or 1 dose., Disp: , Rfl:  .  omeprazole (PRILOSEC) 20 MG capsule, TAKE 1 CAPSULE (20 MG TOTAL) BY MOUTH DAILY., Disp: 30 capsule, Rfl: 3  Allergies  Allergen Reactions  . Morphine Nausea Only and Nausea And Vomiting  . Morphine And Related Nausea And Vomiting    ROS  Ten systems reviewed and is negative except as mentioned in HPI  Objective  Vitals:   09/04/16 1019  BP: (!) 142/70  Pulse: 100  Resp: 16  Temp: 98.9 F (37.2 C)  TempSrc: Oral  SpO2: 96%  Weight: 159 lb 6.4 oz (72.3 kg)  Height: 5\' 11"  (1.803 m)    Body  mass index is 22.23 kg/m.  Nursing Note and Vital Signs reviewed.  Physical Exam  Constitutional: Patient appears well-developed and well-nourished.  No distress.  HEENT: head atraumatic, normocephalic, pupils equal and reactive to light, EOM's intact, RIGHT TM with initial cerumen impaction, once removed, bilateral TM's are WNL with no erythema or bulging, no maxillary or frontal sinus pain on palpation, turbinates mildly boggy, neck supple without lymphadenopathy, or thyromegaly palpated; oropharynx pink and moist without exudate.  Cardiovascular: Normal rate, regular rhythm, S1/S2 present.  No murmur or rub heard. No BLE edema. Pulmonary/Chest: Effort normal and breath sounds  clear. No respiratory distress or retractions. Psychiatric: Patient has a normal mood and affect. behavior is normal. Judgment and thought content normal.  No results found for this or any previous visit (from the past 2160 hour(s)).  Assessment & Plan  1. Oral phase dysphagia - Ambulatory referral to ENT - Advised to eat softer foods and avoid large chunks of food while awaiting referral appointment. Pt is agreeable.  2. Impacted cerumen of right ear - Ear Lavage -- Once performed, TM is clear, patient notes ear pain and fullness completely subsided.  3. Chronic rhinitis - fluticasone (FLONASE) 50 MCG/ACT nasal spray; Place 2 sprays into both nostrils daily.  Dispense: 16 g; Refill: 6  4. Tobacco abuse - Ambulatory referral to ENT - Offered smoking cessation counseling and patient declined.  5. Gastroesophageal reflux disease, esophagitis presence not specified - Ambulatory referral to ENT  -Red flags and when to present for emergency care or RTC including fever >101.98F, chest pain, shortness of breath, new/worsening/un-resolving symptoms, difficulty swallowing or choking reviewed with patient at time of visit. Follow up and care instructions discussed and provided in AVS.  I have reviewed this encounter including the documentation in this note and/or discussed this patient with the Johney Maine, FNP, NP-C. I am certifying that I agree with the content of this note as supervising physician.  Steele Sizer, MD [facility] 09/05/2016, 1:06 PM

## 2016-09-04 NOTE — Patient Instructions (Addendum)
Nasal Allergies Nasal allergies are a reaction to allergens in the air. Allergens are tiny specks (particles) in the air that cause your body to have an allergic reaction. Nasal allergies are not passed from person to person (contagious). They cannot be cured, but they can be controlled. Common causes of nasal allergies include:  Pollen from grasses, trees, and weeds.  House dust mites.  Pet dander.  Mold.  Follow these instructions at home:  Avoid the allergen that is causing your symptoms, if you can.  Keep windows closed. If possible, use air conditioning when there is a lot of pollen in the air.  Do not use fans in your home.  Do not hang clothes outside to dry.  Wear sunglasses to keep pollen out of your eyes.  Wash your hands right away after you touch household pets.  Take over-the-counter and prescription medicines only as told by your doctor.  Keep all follow-up visits as told by your doctor. This is important. Contact a doctor if:  You have a fever.  You have a cough that does not go away (is persistent).  You start to make whistling sounds when you breathe (wheeze).  Your symptoms do not get better with treatment.  You have thick fluid coming from your nose.  You start to have nosebleeds. Get help right away if:  Your tongue or your lips are swollen.  You have trouble breathing.  You feel light-headed or you feel like you are going to pass out (faint).  You have cold sweats. This information is not intended to replace advice given to you by your health care provider. Make sure you discuss any questions you have with your health care provider. Document Released: 06/06/2010 Document Revised: 07/13/2015 Document Reviewed: 08/17/2014 Elsevier Interactive Patient Education  2018 Elsevier Inc.  

## 2016-09-10 ENCOUNTER — Ambulatory Visit
Admission: RE | Admit: 2016-09-10 | Discharge: 2016-09-10 | Disposition: A | Discharge status: Home / Self Care | Payer: Medicare Other | Source: Ambulatory Visit | Attending: Oncology | Admitting: Oncology

## 2016-09-10 DIAGNOSIS — Z87891 Personal history of nicotine dependence: Secondary | ICD-10-CM | POA: Insufficient documentation

## 2016-09-10 DIAGNOSIS — N6001 Solitary cyst of right breast: Secondary | ICD-10-CM | POA: Insufficient documentation

## 2016-09-10 DIAGNOSIS — J439 Emphysema, unspecified: Secondary | ICD-10-CM | POA: Diagnosis not present

## 2016-09-10 DIAGNOSIS — K760 Fatty (change of) liver, not elsewhere classified: Secondary | ICD-10-CM | POA: Diagnosis not present

## 2016-09-10 DIAGNOSIS — I700 Atherosclerosis of aorta: Secondary | ICD-10-CM | POA: Insufficient documentation

## 2016-09-10 DIAGNOSIS — I2510 Atherosclerotic heart disease of native coronary artery without angina pectoris: Secondary | ICD-10-CM | POA: Insufficient documentation

## 2016-09-17 ENCOUNTER — Encounter: Payer: Self-pay | Admitting: *Deleted

## 2016-09-19 DIAGNOSIS — K219 Gastro-esophageal reflux disease without esophagitis: Secondary | ICD-10-CM | POA: Diagnosis not present

## 2016-09-19 DIAGNOSIS — J310 Chronic rhinitis: Secondary | ICD-10-CM | POA: Diagnosis not present

## 2016-09-19 DIAGNOSIS — R1310 Dysphagia, unspecified: Secondary | ICD-10-CM | POA: Diagnosis not present

## 2016-10-02 ENCOUNTER — Telehealth: Payer: Self-pay | Admitting: Family Medicine

## 2016-10-02 NOTE — Telephone Encounter (Signed)
Left message regarding AWV and to see if she can come in 10/07/2016 at 2:30.

## 2016-10-20 ENCOUNTER — Other Ambulatory Visit (INDEPENDENT_AMBULATORY_CARE_PROVIDER_SITE_OTHER): Payer: Self-pay | Admitting: Vascular Surgery

## 2016-11-18 ENCOUNTER — Ambulatory Visit (INDEPENDENT_AMBULATORY_CARE_PROVIDER_SITE_OTHER): Payer: Medicare Other

## 2016-11-18 VITALS — BP 148/70 | HR 80 | Temp 97.5°F | Ht 71.0 in | Wt 155.5 lb

## 2016-11-18 DIAGNOSIS — Z0000 Encounter for general adult medical examination without abnormal findings: Secondary | ICD-10-CM

## 2016-11-18 DIAGNOSIS — Z23 Encounter for immunization: Secondary | ICD-10-CM | POA: Diagnosis not present

## 2016-11-18 DIAGNOSIS — Z1159 Encounter for screening for other viral diseases: Secondary | ICD-10-CM | POA: Diagnosis not present

## 2016-11-18 NOTE — Progress Notes (Signed)
Subjective:   Kristie Walters is a 71 y.o. female who presents for an Initial Medicare Annual Wellness Visit.  Review of Systems    N/A  Cardiac Risk Factors include: advanced age (>13men, >13 women);dyslipidemia;sedentary lifestyle;smoking/ tobacco exposure     Objective:    Today's Vitals   11/18/16 0947  BP: (!) 148/70  Pulse: 80  Temp: (!) 97.5 F (36.4 C)  Weight: 155 lb 8 oz (70.5 kg)  Height: 5\' 11"  (1.803 m)   Body mass index is 21.69 kg/m.   Current Medications (verified) Outpatient Encounter Prescriptions as of 11/18/2016  Medication Sig  . albuterol (PROVENTIL HFA;VENTOLIN HFA) 108 (90 Base) MCG/ACT inhaler Inhale 2 puffs into the lungs every 6 (six) hours as needed for wheezing or shortness of breath.  [name] aspirin 81 MG tablet Take 81 mg by mouth daily.  [name] atorvastatin (LIPITOR) 10 MG tablet TAKE 1 TABLET BY MOUTH EVERY DAY  . Fish Oil-Cholecalciferol (FISH OIL + D3 PO) Take 1,000 mg by mouth 1 day or 1 dose.  . fluticasone (FLONASE) 50 MCG/ACT nasal spray Place 2 sprays into both nostrils daily.  [name] omeprazole (PRILOSEC) 20 MG capsule TAKE 1 CAPSULE (20 MG TOTAL) BY MOUTH DAILY.  . [DISCONTINUED] B Complex-C (B-COMPLEX WITH VITAMIN C) tablet Take 1 tablet by mouth daily.   No facility-administered encounter medications on file as of 11/18/2016.     Allergies (verified) Morphine and Morphine and related   History: Past Medical History:  Diagnosis Date  . Carotid artery disease ([place])    s/p L. CEA in July 2016  . Collagen vascular disease ([place])    Left carotid stenosis  . COPD (chronic obstructive pulmonary disease) ([place])   . Elevated blood pressure (not hypertension)   . GERD (gastroesophageal reflux disease)   . Personal history of tobacco use, presenting hazards to health 08/31/2015  . PONV (postoperative nausea and vomiting)    Past Surgical History:  Procedure Laterality Date  . ENDARTERECTOMY Left 08/25/2014   Procedure: ENDARTERECTOMY CAROTID;   Surgeon: Algernon Huxley, MD;  Location: [facility] ORS;  Service: Vascular;  Laterality: Left;  . MOHS SURGERY    . PERIPHERAL VASCULAR CATHETERIZATION N/A 07/20/2014   Procedure: Carotid Angiography;  Surgeon: Algernon Huxley, MD;  Location: [place] [facility];  Service: Cardiovascular;  Laterality: N/A;  . TONSILLECTOMY    . TUBAL LIGATION     Family History  Problem Relation Age of Onset  . Heart attack Mother   . Hypercholesterolemia Mother   . Hypertension Mother   . Peripheral vascular disease Mother   . Dementia Mother   . Hypothyroidism Mother   . CVA Father   . Liver cancer Father   . Diabetes Brother    Social History   Occupational History  . Not on file.   Social History Main Topics  . Smoking status: Current Every Day Smoker    Packs/day: 1.00    Years: 55.00    Types: Cigarettes  . Smokeless tobacco: Current User  . Alcohol use No  . Drug use: No  . Sexual activity: Yes    Birth control/ protection: Post-menopausal    Tobacco Counseling Ready to quit: No Counseling given: Not Answered   Activities of Daily Living In your present state of health, do you have any difficulty performing the following activities: 11/18/2016 02/27/2016  Hearing? N Y  Vision? N Y  Comment - glasses  Difficulty concentrating or making decisions? N N  Walking or  climbing stairs? N Y  Comment - SOB  Dressing or bathing? N N  Doing errands, shopping? N N  Preparing Food and eating ? N -  Using the Toilet? N -  In the past six months, have you accidently leaked urine? N -  Do you have problems with loss of bowel control? N -  Managing your Medications? N -  Managing your Finances? N -  Housekeeping or managing your Housekeeping? N -  Some recent data might be hidden    Immunizations and Health Maintenance Immunization History  Administered Date(s) Administered  . Influenza-Unspecified 11/19/2014, 11/16/2015, 10/08/2016  . Pneumococcal Conjugate-13 11/18/2016  . Pneumococcal  Polysaccharide-23 06/11/2012   There are no preventive care reminders to display for this patient.  Patient Care Team: Kristie Nova, MD as PCP - General (Family Medicine)  Indicate any recent Medical Services you may have received from other than [facility] providers in the past year (date may be approximate).     Assessment:   This is a routine wellness examination for [name].   Hearing/Vision screen Vision Screening Comments: Does not have regular vision checks  Dietary issues and exercise activities discussed: Current Exercise Habits: The patient does not participate in regular exercise at present, Exercise limited by: respiratory conditions(s) (states she wheezes on occassion)  Goals    . Exercise 3x per week (30 min per time)          Recommend walking 3 times per week for at least 30 minutes    . Increase water intake          Recommend drinking 4 glasses of water each day      Depression Screen PHQ 2/9 Scores 11/18/2016 02/27/2016 08/15/2015 07/12/2015 06/29/2015 05/29/2015 04/26/2015  PHQ - 2 Score 2 0 0 0 0 0 0  PHQ- 9 Score 7 - - - - - -    Fall Risk Fall Risk  11/18/2016 02/27/2016 08/15/2015 07/12/2015 06/29/2015  Falls in the past year? No No No No No  Risk for fall due to : Impaired balance/gait;Impaired vision - - - -  Risk for fall due to: Comment "staggers" when she walks; wears eyeglasses - - - -    Cognitive Function:     6CIT Screen 11/18/2016  What Year? 0 points  What month? 0 points  What time? 0 points  Count back from 20 0 points  Months in reverse 0 points  Repeat phrase 0 points  Total Score 0    Screening Tests Health Maintenance  Topic Date Due  . COLONOSCOPY  11/19/2026 (Originally 06/14/1995)  . MAMMOGRAM  09/28/2017  . TETANUS/TDAP  02/19/2019  . INFLUENZA VACCINE  Completed  . DEXA SCAN  Completed  . Hepatitis C Screening  Completed  . PNA vac Low Risk Adult  Completed      Plan:   I have personally reviewed and addressed the  Medicare Annual Wellness questionnaire and have noted the following in the patient's chart:  A. Medical and social history B. Use of alcohol, tobacco or illicit drugs  C. Current medications and supplements D. Functional ability and status E.  Nutritional status F.  Physical activity G. Advance directives H. List of other physicians I.  Hospitalizations, surgeries, and ER visits in previous 12 months J.  [place] such as hearing and vision if needed, cognitive and depression L. Referrals and appointments - none  In addition, I have reviewed and discussed with patient certain preventive protocols, quality metrics,  and best practice recommendations. A written personalized care plan for preventive services as well as general preventive health recommendations were provided to patient.  See attached scanned questionnaire for additional information.   Signed,  Aleatha Borer, LPN Nurse Health Advisor   MD Recommendations: None. Wants to discuss change in sleep habits, PHQ9 score 7

## 2016-11-18 NOTE — Patient Instructions (Addendum)
Kristie Walters , Thank you for taking time to come for your Medicare Wellness Visit. I appreciate your ongoing commitment to your health goals. Please review the following plan we discussed and let me know if I can assist you in the future.   Screening recommendations/referrals: Colonoscopy: declined Mammogram: completed 09/29/2015 Bone Density: 06/11/12 Recommended yearly ophthalmology/optometry visit for glaucoma screening and checkup Recommended yearly dental visit for hygiene and checkup  Vaccinations: Influenza vaccine: completed 10/08/16 Pneumococcal vaccine: completed series today Tdap vaccine: completed 02/18/2009 Shingles vaccine: declined    Advanced directives: Please bring a copy of your POA (Power of [place]) and/or Living Will to your next appointment.   Conditions/risks identified: Recommend walking 3 times per week for at least 30 minutes; drinking 4 glasses of water each day; Fall risk prevention discussed  Next appointment: Schedule follow up with Dr. Manuella Ghazi prior to leaving today. Follow up annual wellness   Preventive Care 65 Years and Older, Female Preventive care refers to lifestyle choices and visits with your health care provider that can promote health and wellness. What does preventive care include?  A yearly physical exam. This is also called an annual well check.  Dental exams once or twice a year.  Routine eye exams. Ask your health care provider how often you should have your eyes checked.  Personal lifestyle choices, including:  Daily care of your teeth and gums.  Regular physical activity.  Eating a healthy diet.  Avoiding tobacco and drug use.  Limiting alcohol use.  Practicing safe sex.  Taking low-dose aspirin every day.  Taking vitamin and mineral supplements as recommended by your health care provider. What happens during an annual well check? The services and screenings done by your health care provider during your annual well check will  depend on your age, overall health, lifestyle risk factors, and family history of disease. Counseling  Your health care provider may ask you questions about your:  Alcohol use.  Tobacco use.  Drug use.  Emotional well-being.  Home and relationship well-being.  Sexual activity.  Eating habits.  History of falls.  Memory and ability to understand (cognition).  Work and work Statistician.  Reproductive health. Screening  You may have the following tests or measurements:  Height, weight, and BMI.  Blood pressure.  Lipid and cholesterol levels. These may be checked every 5 years, or more frequently if you are over 73 years old.  Skin check.  Lung cancer screening. You may have this screening every year starting at age 1 if you have a 30-pack-year history of smoking and currently smoke or have quit within the past 15 years.  Fecal occult blood test (FOBT) of the stool. You may have this test every year starting at age 47.  Flexible sigmoidoscopy or colonoscopy. You may have a sigmoidoscopy every 5 years or a colonoscopy every 10 years starting at age 16.  Hepatitis C blood test.  Hepatitis B blood test.  Sexually transmitted disease (STD) testing.  Diabetes screening. This is done by checking your blood sugar (glucose) after you have not eaten for a while (fasting). You may have this done every 1-3 years.  Bone density scan. This is done to screen for osteoporosis. You may have this done starting at age 1.  Mammogram. This may be done every 1-2 years. Talk to your health care provider about how often you should have regular mammograms. Talk with your health care provider about your test results, treatment options, and if necessary, the need for more  tests. Vaccines  Your health care provider may recommend certain vaccines, such as:  Influenza vaccine. This is recommended every year.  Tetanus, diphtheria, and acellular pertussis (Tdap, Td) vaccine. You may need a  Td booster every 10 years.  Zoster vaccine. You may need this after age 20.  Pneumococcal 13-valent conjugate (PCV13) vaccine. One dose is recommended after age 13.  Pneumococcal polysaccharide (PPSV23) vaccine. One dose is recommended after age 72. Talk to your health care provider about which screenings and vaccines you need and how often you need them. This information is not intended to replace advice given to you by your health care provider. Make sure you discuss any questions you have with your health care provider. Document Released: 03/03/2015 Document Revised: 10/25/2015 Document Reviewed: 12/06/2014 Elsevier Interactive Patient Education  2017 [place] Prevention in the Home Falls can cause injuries. They can happen to people of all ages. There are many things you can do to make your home safe and to help prevent falls. What can I do on the outside of my home?  Regularly fix the edges of walkways and driveways and fix any cracks.  Remove anything that might make you trip as you walk through a door, such as a raised step or threshold.  Trim any bushes or trees on the path to your home.  Use bright outdoor lighting.  Clear any walking paths of anything that might make someone trip, such as rocks or tools.  Regularly check to see if handrails are loose or broken. Make sure that both sides of any steps have handrails.  Any raised decks and porches should have guardrails on the edges.  Have any leaves, snow, or ice cleared regularly.  Use sand or salt on walking paths during winter.  Clean up any spills in your garage right away. This includes oil or grease spills. What can I do in the bathroom?  Use night lights.  Install grab bars by the toilet and in the tub and shower. Do not use towel bars as grab bars.  Use non-skid mats or decals in the tub or shower.  If you need to sit down in the shower, use a plastic, non-slip stool.  Keep the floor dry. Clean  up any water that spills on the floor as soon as it happens.  Remove soap buildup in the tub or shower regularly.  Attach bath mats securely with double-sided non-slip rug tape.  Do not have throw rugs and other things on the floor that can make you trip. What can I do in the bedroom?  Use night lights.  Make sure that you have a light by your bed that is easy to reach.  Do not use any sheets or blankets that are too big for your bed. They should not hang down onto the floor.  Have a firm chair that has side arms. You can use this for support while you get dressed.  Do not have throw rugs and other things on the floor that can make you trip. What can I do in the kitchen?  Clean up any spills right away.  Avoid walking on wet floors.  Keep items that you use a lot in easy-to-reach places.  If you need to reach something above you, use a strong step stool that has a grab bar.  Keep electrical cords out of the way.  Do not use floor polish or wax that makes floors slippery. If you must use wax, use non-skid floor  wax.  Do not have throw rugs and other things on the floor that can make you trip. What can I do with my stairs?  Do not leave any items on the stairs.  Make sure that there are handrails on both sides of the stairs and use them. Fix handrails that are broken or loose. Make sure that handrails are as long as the stairways.  Check any carpeting to make sure that it is firmly attached to the stairs. Fix any carpet that is loose or worn.  Avoid having throw rugs at the top or bottom of the stairs. If you do have throw rugs, attach them to the floor with carpet tape.  Make sure that you have a light switch at the top of the stairs and the bottom of the stairs. If you do not have them, ask someone to add them for you. What else can I do to help prevent falls?  Wear shoes that:  Do not have high heels.  Have rubber bottoms.  Are comfortable and fit you well.  Are  closed at the toe. Do not wear sandals.  If you use a stepladder:  Make sure that it is fully opened. Do not climb a closed stepladder.  Make sure that both sides of the stepladder are locked into place.  Ask someone to hold it for you, if possible.  Clearly mark and make sure that you can see:  Any grab bars or handrails.  First and last steps.  Where the edge of each step is.  Use tools that help you move around (mobility aids) if they are needed. These include:  Canes.  Walkers.  Scooters.  Crutches.  Turn on the lights when you go into a dark area. Replace any light bulbs as soon as they burn out.  Set up your furniture so you have a clear path. Avoid moving your furniture around.  If any of your floors are uneven, fix them.  If there are any pets around you, be aware of where they are.  Review your medicines with your doctor. Some medicines can make you feel dizzy. This can increase your chance of falling. Ask your doctor what other things that you can do to help prevent falls. This information is not intended to replace advice given to you by your health care provider. Make sure you discuss any questions you have with your health care provider. Document Released: 12/01/2008 Document Revised: 07/13/2015 Document Reviewed: 03/11/2014 Elsevier Interactive Patient Education  2017 [employer].

## 2016-11-19 LAB — HEPATITIS C ANTIBODY
HEP C AB: NONREACTIVE
SIGNAL TO CUT-OFF: 0.01 (ref <1.00)

## 2016-11-20 ENCOUNTER — Telehealth: Payer: Self-pay

## 2016-11-20 NOTE — Telephone Encounter (Signed)
Called pt informed her of negative results.Pt gave verbal understanding.

## 2016-11-20 NOTE — Telephone Encounter (Signed)
-----   Message from Roselee Nova, MD sent at 11/19/2016  8:58 AM EDT ----- Hep C antibody is nonreactive

## 2016-11-21 NOTE — Progress Notes (Signed)
Cosign required to close encounter

## 2016-11-26 ENCOUNTER — Emergency Department: Payer: Medicare Other

## 2016-11-26 ENCOUNTER — Inpatient Hospital Stay
Admission: EM | Admit: 2016-11-26 | Discharge: 2016-12-01 | DRG: 390 | Disposition: A | Discharge status: Home / Self Care | Payer: Medicare Other | Attending: General Surgery | Admitting: General Surgery

## 2016-11-26 ENCOUNTER — Encounter: Payer: Self-pay | Admitting: Emergency Medicine

## 2016-11-26 DIAGNOSIS — R109 Unspecified abdominal pain: Secondary | ICD-10-CM | POA: Diagnosis not present

## 2016-11-26 DIAGNOSIS — K219 Gastro-esophageal reflux disease without esophagitis: Secondary | ICD-10-CM | POA: Diagnosis present

## 2016-11-26 DIAGNOSIS — K56609 Unspecified intestinal obstruction, unspecified as to partial versus complete obstruction: Secondary | ICD-10-CM | POA: Insufficient documentation

## 2016-11-26 DIAGNOSIS — Z0189 Encounter for other specified special examinations: Secondary | ICD-10-CM

## 2016-11-26 DIAGNOSIS — R197 Diarrhea, unspecified: Secondary | ICD-10-CM | POA: Diagnosis not present

## 2016-11-26 DIAGNOSIS — Z79899 Other long term (current) drug therapy: Secondary | ICD-10-CM | POA: Diagnosis not present

## 2016-11-26 DIAGNOSIS — J449 Chronic obstructive pulmonary disease, unspecified: Secondary | ICD-10-CM | POA: Diagnosis not present

## 2016-11-26 DIAGNOSIS — K565 Intestinal adhesions [bands], unspecified as to partial versus complete obstruction: Principal | ICD-10-CM | POA: Diagnosis present

## 2016-11-26 DIAGNOSIS — K56699 Other intestinal obstruction unspecified as to partial versus complete obstruction: Secondary | ICD-10-CM | POA: Diagnosis not present

## 2016-11-26 DIAGNOSIS — Z4659 Encounter for fitting and adjustment of other gastrointestinal appliance and device: Secondary | ICD-10-CM

## 2016-11-26 DIAGNOSIS — Z4682 Encounter for fitting and adjustment of non-vascular catheter: Secondary | ICD-10-CM | POA: Diagnosis not present

## 2016-11-26 DIAGNOSIS — Z7982 Long term (current) use of aspirin: Secondary | ICD-10-CM

## 2016-11-26 DIAGNOSIS — K5652 Intestinal adhesions [bands] with complete obstruction: Secondary | ICD-10-CM | POA: Diagnosis not present

## 2016-11-26 DIAGNOSIS — F17210 Nicotine dependence, cigarettes, uncomplicated: Secondary | ICD-10-CM | POA: Diagnosis present

## 2016-11-26 LAB — CBC
HCT: 47.7 % — ABNORMAL HIGH (ref 35.0–47.0)
Hemoglobin: 16.5 g/dL — ABNORMAL HIGH (ref 12.0–16.0)
MCH: 29.5 pg (ref 26.0–34.0)
MCHC: 34.5 g/dL (ref 32.0–36.0)
MCV: 85.3 fL (ref 80.0–100.0)
PLATELETS: 290 10*3/uL (ref 150–440)
RBC: 5.59 MIL/uL — AB (ref 3.80–5.20)
RDW: 13.3 % (ref 11.5–14.5)
WBC: 18.5 10*3/uL — ABNORMAL HIGH (ref 3.6–11.0)

## 2016-11-26 LAB — URINALYSIS, COMPLETE (UACMP) WITH MICROSCOPIC
Bacteria, UA: NONE SEEN
Bilirubin Urine: NEGATIVE
Glucose, UA: NEGATIVE mg/dL
Hgb urine dipstick: NEGATIVE
Ketones, ur: 5 mg/dL — AB
Nitrite: NEGATIVE
PH: 5 (ref 5.0–8.0)
Protein, ur: 30 mg/dL — AB
SPECIFIC GRAVITY, URINE: 1.02 (ref 1.005–1.030)

## 2016-11-26 LAB — COMPREHENSIVE METABOLIC PANEL
ALK PHOS: 167 U/L — AB (ref 38–126)
ALT: 27 U/L (ref 14–54)
AST: 26 U/L (ref 15–41)
Albumin: 4 g/dL (ref 3.5–5.0)
Anion gap: 12 (ref 5–15)
BUN: 13 mg/dL (ref 6–20)
CALCIUM: 9.5 mg/dL (ref 8.9–10.3)
CO2: 25 mmol/L (ref 22–32)
CREATININE: 0.96 mg/dL (ref 0.44–1.00)
Chloride: 101 mmol/L (ref 101–111)
GFR, EST NON AFRICAN AMERICAN: 58 mL/min — AB (ref >60)
Glucose, Bld: 138 mg/dL — ABNORMAL HIGH (ref 65–99)
Potassium: 4.1 mmol/L (ref 3.5–5.1)
SODIUM: 138 mmol/L (ref 135–145)
Total Bilirubin: 1.1 mg/dL (ref 0.3–1.2)
Total Protein: 7.7 g/dL (ref 6.5–8.1)

## 2016-11-26 LAB — LIPASE, BLOOD: Lipase: 18 U/L (ref 11–51)

## 2016-11-26 MED ORDER — SODIUM CHLORIDE 0.9 % IV BOLUS (SEPSIS)
500.0000 mL | INTRAVENOUS | Status: AC
  Administered 2016-11-26: 500 mL via INTRAVENOUS

## 2016-11-26 MED ORDER — AZITHROMYCIN 500 MG PO TABS: 1000.0000 mg | ORAL_TABLET | ORAL | Status: DC

## 2016-11-26 MED ORDER — IOPAMIDOL (ISOVUE-300) INJECTION 61%
100.0000 mL | Freq: Once | INTRAVENOUS | Status: AC | PRN
  Administered 2016-11-26: 100 mL via INTRAVENOUS
  Filled 2016-11-26: qty 100

## 2016-11-26 MED ORDER — ONDANSETRON 4 MG PO TBDP: 4.0000 mg | ORAL_TABLET | Freq: Once | ORAL | Status: DC

## 2016-11-26 MED ORDER — BENZOCAINE 20 % MT SOLN
OROMUCOSAL | Status: AC
  Administered 2016-11-27: 01:00:00
  Filled 2016-11-26: qty 5

## 2016-11-26 MED ORDER — ONDANSETRON HCL 4 MG/2ML IJ SOLN
4.0000 mg | INTRAMUSCULAR | Status: AC
  Administered 2016-11-26: 4 mg via INTRAVENOUS
  Filled 2016-11-26: qty 2

## 2016-11-26 MED ORDER — CEFTRIAXONE SODIUM 250 MG IJ SOLR
250.0000 mg | Freq: Once | INTRAMUSCULAR | Status: DC
Start: 2016-11-26 — End: 2016-11-26

## 2016-11-26 MED ORDER — MORPHINE SULFATE (PF) 2 MG/ML IV SOLN
2.0000 mg | Freq: Once | INTRAVENOUS | Status: AC
  Administered 2016-11-26: 2 mg via INTRAVENOUS
  Filled 2016-11-26: qty 1

## 2016-11-26 NOTE — ED Notes (Signed)
Pt to the ER for severe belly pain. Pt has lower middle abd pain and is very tender to the touch. BM have been normal until taking 2 stool softeners yesterday. Pt reports feeling bloated. Pt unbale to pass gas. Pt still has appendix and uterus.

## 2016-11-26 NOTE — H&P (Signed)
Patient ID: Kristie Walters, female   DOB: 1945/10/29, 71 y.o.   MRN: [id number]  CC: Abdominal pain  HPI Kristie Walters is a 71 y.o. female who presents emergency department with a 2-3 day history of abdominal pain. Patient reports she's never had anything like this before. Her abdomen continued to enlarged were now dissected very tender to touch and worsens with movement. She states it is a sharp pain especially when it is touched. It does improve when she lays flat. She states she initially thought was constipation attempted to self medicate with suppositories. She had a watery bowel movement yesterday without any resolution or improvement. Patient reports nausea but no vomiting. She has had a decreased appetite. Patient reports she does have chronic shortness of breath but denies any fevers, chills, chest pain. She denies any recent sick contacts or other changes in her health.  HPI  Past Medical History:  Diagnosis Date  . Carotid artery disease ([place])    s/p L. CEA in July 2016  . Collagen vascular disease ([place])    Left carotid stenosis  . COPD (chronic obstructive pulmonary disease) ([place])   . Elevated blood pressure (not hypertension)   . GERD (gastroesophageal reflux disease)   . Personal history of tobacco use, presenting hazards to health 08/31/2015  . PONV (postoperative nausea and vomiting)     Past Surgical History:  Procedure Laterality Date  . ENDARTERECTOMY Left 08/25/2014   Procedure: ENDARTERECTOMY CAROTID;  Surgeon: Algernon Huxley, MD;  Location: [facility] ORS;  Service: Vascular;  Laterality: Left;  . MOHS SURGERY    . PERIPHERAL VASCULAR CATHETERIZATION N/A 07/20/2014   Procedure: Carotid Angiography;  Surgeon: Algernon Huxley, MD;  Location: [facility];  Service: Cardiovascular;  Laterality: N/A;  . TONSILLECTOMY    . TUBAL LIGATION      Family History  Problem Relation Age of Onset  . Heart attack Mother   . Hypercholesterolemia Mother   . Hypertension Mother   .  Peripheral vascular disease Mother   . Dementia Mother   . Hypothyroidism Mother   . CVA Father   . Liver cancer Father   . Diabetes Brother     Social History Social History  Substance Use Topics  . Smoking status: Current Every Day Smoker    Packs/day: 1.00    Years: 55.00    Types: Cigarettes  . Smokeless tobacco: Current User  . Alcohol use No    Allergies  Allergen Reactions  . Morphine Nausea Only and Nausea And Vomiting  . Morphine And Related Nausea And Vomiting    No current facility-administered medications for this encounter.    Current Outpatient Prescriptions  Medication Sig Dispense Refill  . albuterol (PROVENTIL HFA;VENTOLIN HFA) 108 (90 Base) MCG/ACT inhaler Inhale 2 puffs into the lungs every 6 (six) hours as needed for wheezing or shortness of breath. 2 Inhaler 1  . aspirin 81 MG tablet Take 81 mg by mouth daily.    [name] atorvastatin (LIPITOR) 10 MG tablet TAKE 1 TABLET BY MOUTH EVERY DAY 90 tablet 3  . Fish Oil-Cholecalciferol (FISH OIL + D3 PO) Take 1,000 mg by mouth 1 day or 1 dose.    . fluticasone (FLONASE) 50 MCG/ACT nasal spray Place 2 sprays into both nostrils daily. 16 g 6  . omeprazole (PRILOSEC) 40 MG capsule Take 40 mg by mouth daily.    [name] omeprazole (PRILOSEC) 20 MG capsule TAKE 1 CAPSULE (20 MG TOTAL) BY MOUTH DAILY. (Patient not  taking: Reported on 11/26/2016) 30 capsule 3     Review of Systems A Multi-point review of systems was asked and was negative except for the findings documented in the history of present illness  Physical Exam Blood pressure (!) 164/95, pulse (!) 106, temperature 98.8 F (37.1 C), resp. rate 18, height 5\' 11"  (1.803 m), weight 70.3 kg (155 lb), SpO2 96 %. CONSTITUTIONAL: Resting in bed in no acute distress. EYES: Pupils are equal, round, and reactive to light, Sclera are non-icteric. EARS, NOSE, MOUTH AND THROAT: The oropharynx is clear. The oral mucosa is pink and moist. Hearing is intact to voice. LYMPH NODES:   Lymph nodes in the neck are normal. RESPIRATORY:  Lungs are clear. There is normal respiratory effort, with equal breath sounds bilaterally, and without pathologic use of accessory muscles. CARDIOVASCULAR: Heart is regular without murmurs, gallops, or rubs. GI: The abdomen is soft, tender to palpation in all quadrants and markedly distended. There are no palpable masses. There is no hepatosplenomegaly. There are normal bowel sounds in all quadrants. GU: Rectal deferred.   MUSCULOSKELETAL: Normal muscle strength and tone. No cyanosis or edema.   SKIN: Turgor is good and there are no pathologic skin lesions or ulcers. NEUROLOGIC: Motor and sensation is grossly normal. Cranial nerves are grossly intact. PSYCH:  Oriented to person, place and time. Affect is normal.  Data Reviewed Images and labs reviewed, labs concerning for leukocytosis of 18.5, elevated alkaline phosphatase of 167, otherwise within normal limits. CT scan of the abdomen shows dilated proximal small bowel with a decompressed distal small bowel consistent with a small bowel obstruction. There is also free fluid within the pelvis. I have personally reviewed the patient's imaging, laboratory findings and medical records.    Assessment    Small bowel obstruction    Plan    71 year old female with a small bowel traction. Discussed the diagnosis at length with the patient and family member in the room. Discussed the treatment of admission, IV fluid hydration, NG tube decompression. Discussed the likelihood of resolution. Also discussed the possibility of not improving on NG tube decompression and the need for an operative intervention. Discussed the normal plan of at least 48 hours of NG tube decompression. Should she worsen during the first 48 hours or fail to improve that an operation would be indicated. Patient voiced understanding. She agrees with this plan and agrees for admission at this time.     Time spent with the patient was  50 minutes, with more than 50% of the time spent in face-to-face education, counseling and care coordination.     Clayburn Pert, MD FACS General Surgeon 11/26/2016, 11:13 PM

## 2016-11-26 NOTE — ED Notes (Signed)
Family at bedside. 

## 2016-11-26 NOTE — ED Provider Notes (Signed)
[facility] Emergency Department Provider Note  ____________________________________________   First MD Initiated Contact with Patient 11/26/16 2106     (approximate)  I have reviewed the triage vital signs and the nursing notes.   HISTORY  Chief Complaint Abdominal Pain    HPI Kristie Walters is a 71 y.o. female Who presents for evaluation of gradually worsening abdominal pain over the last 3 days.  It is now severe and very tender to the touch and worse with any kind of movement.  It is slightly better with rest but she cannot even lie down flat because it makes her abdomen hurt worse.  She states that as it gradually got worse, she also developed more and more abdominal distention, and now she is very tight and swollen.  She has nausea and decreased appetite but no vomiting.  She states that usually her bowel movements are very regular and occur once a day in the morning after breakfast.  2 days ago when the symptoms began she had a very small bowel movement and then was not able to pass anything for the next 24 hours, not even passing any gas.  She took 2 stool softeners yesterday and then said that she passed a great deal of loose stool, but that has not passed anything else since that time.  She denies any abdominal surgical history.  She denies fever/chills, chest pain or shortness of breath.  She does have COPD but does not use supplementary oxygen.  Past Medical History:  Diagnosis Date  . Carotid artery disease ([place])    s/p L. CEA in July 2016  . Collagen vascular disease ([place])    Left carotid stenosis  . COPD (chronic obstructive pulmonary disease) ([place])   . Elevated blood pressure (not hypertension)   . GERD (gastroesophageal reflux disease)   . Personal history of tobacco use, presenting hazards to health 08/31/2015  . PONV (postoperative nausea and vomiting)     Patient Active Problem List   Diagnosis Date Noted  . Acute non-recurrent maxillary  sinusitis 02/27/2016  . Aortic atherosclerosis ([place]) 09/14/2015  . Personal history of tobacco use, presenting hazards to health 08/31/2015  . Paresthesia of foot, bilateral 08/15/2015  . Well woman exam 07/12/2015  . Breast mass, right 07/12/2015  . Tobacco abuse 07/12/2015  . Screening for osteoporosis 07/12/2015  . Tick bite of groin 06/29/2015  . Elevated alkaline phosphatase level 05/29/2015  . COPD (chronic obstructive pulmonary disease) ([place]) 04/26/2015  . GERD (gastroesophageal reflux disease) 04/26/2015  . Elevated blood pressure (not hypertension) 04/26/2015  . Hyperlipidemia 04/26/2015  . Hyperglycemia 04/26/2015  . Carotid stenosis 08/25/2014  . H/O malignant neoplasm of skin 08/13/2012    Past Surgical History:  Procedure Laterality Date  . ENDARTERECTOMY Left 08/25/2014   Procedure: ENDARTERECTOMY CAROTID;  Surgeon: Algernon Huxley, MD;  Location: [facility] ORS;  Service: Vascular;  Laterality: Left;  . MOHS SURGERY    . PERIPHERAL VASCULAR CATHETERIZATION N/A 07/20/2014   Procedure: Carotid Angiography;  Surgeon: Algernon Huxley, MD;  Location: [facility];  Service: Cardiovascular;  Laterality: N/A;  . TONSILLECTOMY    . TUBAL LIGATION      Prior to Admission medications   Medication Sig Start Date End Date Taking? Authorizing Provider  albuterol (PROVENTIL HFA;VENTOLIN HFA) 108 (90 Base) MCG/ACT inhaler Inhale 2 puffs into the lungs every 6 (six) hours as needed for wheezing or shortness of breath. 02/27/16  Yes Roselee Nova, MD  aspirin 81 MG tablet Take 81 mg by mouth daily.   Yes [provider]  atorvastatin (LIPITOR) 10 MG tablet TAKE 1 TABLET BY MOUTH EVERY DAY 10/22/16  Yes Dew, Erskine Squibb, MD  Fish Oil-Cholecalciferol (FISH OIL + D3 PO) Take 1,000 mg by mouth 1 day or 1 dose.   Yes [provider]  fluticasone (FLONASE) 50 MCG/ACT nasal spray Place 2 sprays into both nostrils daily. 09/04/16  Yes Hubbard Hartshorn, FNP  omeprazole (PRILOSEC) 40 MG  capsule Take 40 mg by mouth daily.   Yes [provider]  omeprazole (PRILOSEC) 20 MG capsule TAKE 1 CAPSULE (20 MG TOTAL) BY MOUTH DAILY. Patient not taking: Reported on 11/26/2016 04/24/16   Roselee Nova, MD    Allergies Morphine and Morphine and related  Family History  Problem Relation Age of Onset  . Heart attack Mother   . Hypercholesterolemia Mother   . Hypertension Mother   . Peripheral vascular disease Mother   . Dementia Mother   . Hypothyroidism Mother   . CVA Father   . Liver cancer Father   . Diabetes Brother     Social History Social History  Substance Use Topics  . Smoking status: Current Every Day Smoker    Packs/day: 1.00    Years: 55.00    Types: Cigarettes  . Smokeless tobacco: Current User  . Alcohol use No    Review of Systems Constitutional: No fever/chills Eyes: No visual changes. ENT: No sore throat. Cardiovascular: Denies chest pain. Respiratory: Denies shortness of breath. Gastrointestinal: gradually worsening abdominal pain and distention over the last 2 days.  Nausea but no vomiting.  Unable to pass gas and not having normal bowel movements. Genitourinary: Negative for dysuria. Musculoskeletal: Negative for neck pain.  Negative for back pain. Integumentary: Negative for rash. Neurological: Negative for headaches, focal weakness or numbness.   ____________________________________________   PHYSICAL EXAM:  VITAL SIGNS: ED Triage Vitals [11/26/16 1946]  Enc Vitals Group     BP (!) 177/88     Pulse Rate (!) 113     Resp 18     Temp 98.8 F (37.1 C)     Temp src      SpO2 96 %     Weight 70.3 kg (155 lb)     Height 1.803 m (5\' 11" )     Head Circumference      Peak Flow      Pain Score 9     Pain Loc      Pain Edu?      Excl. in [place]?     Constitutional: Alert and oriented. laughing and joking with me but does appear to be in a significant amount of discomfort Eyes: Conjunctivae are normal.  Head:  Atraumatic. Nose: No congestion/rhinnorhea. Mouth/Throat: Mucous membranes are moist. Neck: No stridor.  No meningeal signs.   Cardiovascular: Normal rate, regular rhythm. Good peripheral circulation. Grossly normal heart sounds. Respiratory: Normal respiratory effort.  No retractions. expiratory wheezing throughout lung fields consistent with her COPD history Gastrointestinal: tight and distended abdomen with severe tenderness to palpation throughout.  No bowel sounds upon auscultation. Musculoskeletal: No lower extremity tenderness nor edema. No gross deformities of extremities. Neurologic:  Normal speech and language. No gross focal neurologic deficits are appreciated.  Skin:  Skin is warm, dry and intact. No rash noted. Psychiatric: Mood and affect are normal. Speech and behavior are normal.  ____________________________________________   LABS (all labs ordered are listed, but only abnormal  results are displayed)  Labs Reviewed  COMPREHENSIVE METABOLIC PANEL - Abnormal; Notable for the following:       Result Value   Glucose, Bld 138 (*)    Alkaline Phosphatase 167 (*)    GFR calc non Af Amer 58 (*)    All other components within normal limits  CBC - Abnormal; Notable for the following:    WBC 18.5 (*)    RBC 5.59 (*)    Hemoglobin 16.5 (*)    HCT 47.7 (*)    All other components within normal limits  URINALYSIS, COMPLETE (UACMP) WITH MICROSCOPIC - Abnormal; Notable for the following:    Color, Urine AMBER (*)    APPearance HAZY (*)    Ketones, ur 5 (*)    Protein, ur 30 (*)    Leukocytes, UA MODERATE (*)    Squamous Epithelial / LPF 0-5 (*)    All other components within normal limits  URINE CULTURE  LIPASE, BLOOD   ____________________________________________  EKG  ED ECG REPORT I, Urian Martenson, the attending physician, personally viewed and interpreted this ECG.  Date: 11/26/2016 EKG Time: 19:55 Rate: 110 Rhythm: sinus tachycardia QRS Axis:  normal Intervals: normal ST/T Wave abnormalities: normal Narrative Interpretation: no evidence of acute ischemia  ____________________________________________  RADIOLOGY   Ct Abdomen Pelvis W Contrast  Result Date: 11/26/2016 CLINICAL DATA:  Generalized abdominal pain for 2 days with nausea, constipation yesterday, took Colace then had diarrhea today, abdominal distension, history COPD EXAM: CT ABDOMEN AND PELVIS WITH CONTRAST TECHNIQUE: Multidetector CT imaging of the abdomen and pelvis was performed using the standard protocol following bolus administration of intravenous contrast. Sagittal and coronal MPR images reconstructed from axial data set. CONTRAST:  114mL ISOVUE-300 IOPAMIDOL (ISOVUE-300) INJECTION 61% IV. No oral contrast administered. COMPARISON:  02/20/2014 FINDINGS: Lower chest: Lung bases clear Hepatobiliary: Fatty infiltration of liver. Gallbladder and liver otherwise unremarkable. Pancreas: Normal appearance Spleen: Normal appearance Adrenals/Urinary Tract: Nonobstructing 5 mm RIGHT renal calculus image 36. Adrenal glands, kidneys, ureters, and bladder otherwise normal appearance. Stomach/Bowel: Appendix not visualized. Dilated proximal and decompressed distal small bowel loops compatible with small bowel obstruction. Transition from dilated to nondilated small bowel occurs in the upper RIGHT pelvis question due to adhesion. No evidence of perforation or definite mass lesion. Stomach and colon unremarkable. Vascular/Lymphatic: Atherosclerotic calcifications aorta and iliac arteries without aneurysm. Reproductive: Uterus and adnexa normal appearance Other: Low-attenuation free pelvic fluid. No free air. No definite hernia. Musculoskeletal: Osseous demineralization diffusely. IMPRESSION: Distal small bowel obstruction question due to adhesion with transitional zone noted in the upper RIGHT pelvis. Associated free pelvic fluid. Fatty infiltration of liver. Nonobstructing 5 mm RIGHT renal  calculus. Aortic Atherosclerosis (ICD10-I70.0). Electronically Signed   By: Lavonia Dana M.D.   On: 11/26/2016 22:12    ____________________________________________   PROCEDURES  Critical Care performed: No   Procedure(s) performed:   Procedures   ____________________________________________   INITIAL IMPRESSION / ASSESSMENT AND PLAN / ED COURSE  As part of my medical decision making, I reviewed the following data within the [facility] notes reviewed and incorporated, Labs reviewed  and Old chart reviewed    labs had to be redrawn unfortunately and they are currently being processed. Differential diagnosis includes, but is not limited to, ovarian cyst, ovarian torsion, acute appendicitis, diverticulitis, urinary tract infection/pyelonephritis, endometriosis, bowel obstruction, colitis, renal colic, gastroenteritis, hernia,  etc. however her presentation is very strongly consistent with a high-grade small bowel obstruction.  I will not try  to get her to drink oral contrast because I do not believe she will be able to tolerate much of any of it.  I will await the results of her metabolic panel to make sure her kidney function is normal and then obtain a CT scan of her abdomen and pelvis to verify my diagnosis.  She is nervous about morphine because of prior episodes of nausea and vomiting, but she is also very uncomfortable.  I will give a small dose of morphine as well as Zofran.  She is mildly tachycardic so gave her a small fluid bolus as well.    Clinical Course as of Nov 27 2306  Tue Nov 26, 2016  2307 CT scan is consistent with small bowel obstruction likely due to adhesions.  I informed the patient.  I called and spoke by phone and in person with Dr. Adonis Huguenin who evaluated the patient in person and will admit.  We are facing an NG tube in the ED and then she will be taken upstairs.  [CF]    Clinical Course User Index [CF] Hinda Kehr, MD     ____________________________________________  FINAL CLINICAL IMPRESSION(S) / ED DIAGNOSES  Final diagnoses:  Small bowel obstruction ([place])     MEDICATIONS GIVEN DURING THIS VISIT:  Medications  sodium chloride 0.9 % bolus 500 mL (0 mLs Intravenous Stopped 11/26/16 2226)  morphine 2 MG/ML injection 2 mg (2 mg Intravenous Given 11/26/16 2132)  ondansetron (ZOFRAN) injection 4 mg (4 mg Intravenous Given 11/26/16 2132)  iopamidol (ISOVUE-300) 61 % injection 100 mL (100 mLs Intravenous Contrast Given 11/26/16 2155)  sodium chloride 0.9 % bolus 500 mL (0 mLs Intravenous Stopped 11/26/16 2256)     NEW OUTPATIENT MEDICATIONS STARTED DURING THIS VISIT:  New Prescriptions   No medications on file    Modified Medications   No medications on file    Discontinued Medications   No medications on file     Note:  This document was prepared using Dragon voice recognition software and may include unintentional dictation errors.    Hinda Kehr, MD 11/26/16 2308

## 2016-11-26 NOTE — ED Triage Notes (Addendum)
Patient with complaint of generalized abdominal pain times two days with nausea. Patient states that she was constipated yesterday and took 2 Colace and had diarrhea today.  Patient with abdominal distention.

## 2016-11-26 NOTE — ED Notes (Signed)
Assisted pt to bathroom to void

## 2016-11-26 NOTE — ED Notes (Signed)
Patient transported to CT 

## 2016-11-27 ENCOUNTER — Inpatient Hospital Stay: Payer: Medicare Other

## 2016-11-27 DIAGNOSIS — K5652 Intestinal adhesions [bands] with complete obstruction: Secondary | ICD-10-CM

## 2016-11-27 LAB — COMPREHENSIVE METABOLIC PANEL
ALBUMIN: 3.2 g/dL — AB (ref 3.5–5.0)
ALK PHOS: 126 U/L (ref 38–126)
ALT: 23 U/L (ref 14–54)
AST: 27 U/L (ref 15–41)
Anion gap: 9 (ref 5–15)
BILIRUBIN TOTAL: 0.7 mg/dL (ref 0.3–1.2)
BUN: 12 mg/dL (ref 6–20)
CALCIUM: 8.6 mg/dL — AB (ref 8.9–10.3)
CO2: 25 mmol/L (ref 22–32)
Chloride: 104 mmol/L (ref 101–111)
Creatinine, Ser: 0.95 mg/dL (ref 0.44–1.00)
GFR calc Af Amer: >60 mL/min (ref >60)
GFR calc non Af Amer: 59 mL/min — ABNORMAL LOW (ref >60)
GLUCOSE: 190 mg/dL — AB (ref 65–99)
Potassium: 4.1 mmol/L (ref 3.5–5.1)
Sodium: 138 mmol/L (ref 135–145)
TOTAL PROTEIN: 6.3 g/dL — AB (ref 6.5–8.1)

## 2016-11-27 LAB — CBC
HEMATOCRIT: 38.4 % (ref 35.0–47.0)
Hemoglobin: 13.4 g/dL (ref 12.0–16.0)
MCH: 29.8 pg (ref 26.0–34.0)
MCHC: 34.9 g/dL (ref 32.0–36.0)
MCV: 85.5 fL (ref 80.0–100.0)
Platelets: 213 10*3/uL (ref 150–440)
RBC: 4.49 MIL/uL (ref 3.80–5.20)
RDW: 13.2 % (ref 11.5–14.5)
WBC: 14.5 10*3/uL — ABNORMAL HIGH (ref 3.6–11.0)

## 2016-11-27 LAB — SURGICAL PCR SCREEN
MRSA, PCR: NEGATIVE
STAPHYLOCOCCUS AUREUS: NEGATIVE

## 2016-11-27 MED ORDER — FENTANYL CITRATE (PF) 100 MCG/2ML IJ SOLN
25.0000 ug | INTRAMUSCULAR | Status: DC | PRN
  Administered 2016-11-27 (×3): 25 ug via INTRAVENOUS
  Filled 2016-11-27 (×3): qty 2

## 2016-11-27 MED ORDER — ENOXAPARIN SODIUM 40 MG/0.4ML ~~LOC~~ SOLN
40.0000 mg | SUBCUTANEOUS | Status: DC
  Administered 2016-11-27 – 2016-12-01 (×5): 40 mg via SUBCUTANEOUS
  Filled 2016-11-27 (×5): qty 0.4

## 2016-11-27 MED ORDER — ONDANSETRON HCL 4 MG/2ML IJ SOLN
4.0000 mg | Freq: Four times a day (QID) | INTRAMUSCULAR | Status: DC | PRN
  Administered 2016-11-27 (×3): 4 mg via INTRAVENOUS
  Filled 2016-11-27 (×3): qty 2

## 2016-11-27 MED ORDER — ALBUTEROL SULFATE (2.5 MG/3ML) 0.083% IN NEBU
2.5000 mg | INHALATION_SOLUTION | Freq: Four times a day (QID) | RESPIRATORY_TRACT | Status: DC | PRN
  Administered 2016-11-28: 2.5 mg via RESPIRATORY_TRACT
  Filled 2016-11-27: qty 3

## 2016-11-27 MED ORDER — PANTOPRAZOLE SODIUM 40 MG IV SOLR
40.0000 mg | Freq: Every day | INTRAVENOUS | Status: DC
  Administered 2016-11-27 – 2016-11-30 (×4): 40 mg via INTRAVENOUS
  Filled 2016-11-27 (×4): qty 40

## 2016-11-27 MED ORDER — DIPHENHYDRAMINE HCL 12.5 MG/5ML PO ELIX
12.5000 mg | ORAL_SOLUTION | Freq: Four times a day (QID) | ORAL | Status: DC | PRN
  Filled 2016-11-27: qty 5

## 2016-11-27 MED ORDER — HYDRALAZINE HCL 20 MG/ML IJ SOLN: 10.0000 mg | INTRAMUSCULAR | Status: DC | PRN

## 2016-11-27 MED ORDER — DEXTROSE IN LACTATED RINGERS 5 % IV SOLN
INTRAVENOUS | Status: DC
  Administered 2016-11-27 – 2016-11-29 (×8): via INTRAVENOUS

## 2016-11-27 MED ORDER — NICOTINE 21 MG/24HR TD PT24: 21.0000 mg | MEDICATED_PATCH | Freq: Every day | TRANSDERMAL | Status: DC | PRN

## 2016-11-27 MED ORDER — ONDANSETRON 4 MG PO TBDP: 4.0000 mg | ORAL_TABLET | Freq: Four times a day (QID) | ORAL | Status: DC | PRN

## 2016-11-27 MED ORDER — DIPHENHYDRAMINE HCL 50 MG/ML IJ SOLN: 12.5000 mg | Freq: Four times a day (QID) | INTRAMUSCULAR | Status: DC | PRN

## 2016-11-27 NOTE — Plan of Care (Signed)
Problem: Education: Goal: Knowledge of [facility] Education information/materials will improve Outcome: Progressing Pt remains NPO and continues on LIWS fro NG tube. No BM throughout shift, but hypoactive bowel sounds present. Pts VSS, A&O X4, reporting pain, but declining any intervention at this time. Pt OOB to chair with minimal assistance. Will continue to monitor.

## 2016-11-27 NOTE — Progress Notes (Signed)
Pt DX with small bowel obst, NG tube placed in the ED at 75 cm in the right nare. ABD X-ray view by MD showed tube curled up in the stomach. Per MD pull tube back 8cm. Tube pulled back as verbally ordered 8 cm. Tube now at 67 cm. ABD x-ray ordered for placement and completed. Awaiting results.

## 2016-11-27 NOTE — ED Notes (Signed)
Family at bedside. 

## 2016-11-27 NOTE — Progress Notes (Signed)
SURGICAL PROGRESS NOTE (cpt [phone])  Hospital Day(s): 1.   Post op day(s):  [name]   Interval History: Patient seen and examined, no acute events or new complaints since admitted overnight. Patient reports her nausea has resolved with decreased distention/bloating, and decreased but not completely resolved diffuse moderate abdominal pain. She otherwise denies fever/chills, CP, or SOB.  Review of Systems:  Constitutional: denies fever, chills  HEENT: denies cough or congestion  Respiratory: denies any shortness of breath  Cardiovascular: denies chest pain or palpitations  Gastrointestinal: abdominal pain, N/V, and bowel function as per interval history Genitourinary: denies burning with urination or urinary frequency Musculoskeletal: denies pain, decreased motor or sensation Integumentary: denies any other rashes or skin discolorations Neurological: denies HA or vision/hearing changes   Vital signs in last 24 hours: [min-max] current  Temp:  [98 F (36.7 C)-98.8 F (37.1 C)] 98 F (36.7 C) (10/10 0453) Pulse Rate:  [81-113] 87 (10/10 0453) Resp:  [18] 18 (10/10 0453) BP: (102-177)/(52-95) 148/75 (10/10 0453) SpO2:  [91 %-100 %] 100 % (10/10 0453) Weight:  [155 lb (70.3 kg)] 155 lb (70.3 kg) (10/09 1946)     Height: 5\' 11"  (180.3 cm) Weight: 155 lb (70.3 kg) BMI (Calculated): 21.63   Intake/Output this shift:  No intake/output data recorded.   Intake/Output last 2 shifts:  @IOLAST2SHIFTS @   Physical Exam:  Constitutional: alert, cooperative and no distress  HENT: normocephalic without obvious abnormality  Eyes: PERRL, EOM's grossly intact and symmetric  Neuro: CN II - XII grossly intact and symmetric without deficit  Respiratory: breathing non-labored at rest  Cardiovascular: regular rate and sinus rhythm  Gastrointestinal: soft, diffuse moderate tenderness to palpation, mild abdominal distention Musculoskeletal: UE and LE FROM, no edema or wounds, motor and sensation grossly  intact, NT   Labs:  CBC Latest Ref Rng & Units 11/27/2016 11/26/2016 06/29/2015  WBC 3.6 - 11.0 K/uL 14.5(H) 18.5(H) 8.4  Hemoglobin 12.0 - 16.0 g/dL 13.4 16.5(H) 14.5  Hematocrit 35.0 - 47.0 % 38.4 47.7(H) 41.8  Platelets 150 - 440 K/uL 213 290 253   CMP Latest Ref Rng & Units 11/27/2016 11/26/2016 06/29/2015  Glucose 65 - 99 mg/dL 190(H) 138(H) 97  BUN 6 - 20 mg/dL 12 13 11   Creatinine 0.44 - 1.00 mg/dL 0.95 0.96 0.81  Sodium 135 - 145 mmol/L 138 138 141  Potassium 3.5 - 5.1 mmol/L 4.1 4.1 4.7  Chloride 101 - 111 mmol/L 104 101 100  CO2 22 - 32 mmol/L 25 25 25   Calcium 8.9 - 10.3 mg/dL 8.6(L) 9.5 9.6  Total Protein 6.5 - 8.1 g/dL 6.3(L) 7.7 6.9  Total Bilirubin 0.3 - 1.2 mg/dL 0.7 1.1 0.7  Alkaline Phos 38 - 126 U/L 126 167(H) 194(H)  AST 15 - 41 U/L 27 26 26   ALT 14 - 54 U/L 23 27 27    Imaging studies: No new pertinent imaging studies, admission CT abdomen/pelvis personally reviewed   Assessment/Plan: (ICD-10's: K69.52) 71 y.o. female with complete small bowel obstruction, likely attributable to post-surgical adhesions following remote open tubal ligation, complicated by pertinent comorbidities including COPD, cerebrovascular disease s/p Left CEA (2016), collagen vascular disease, chronic ongoing tobacco abuse (smoking), and GERD.              - NPO, IVF              - NG tube for nasogastric decompression             - monitor ongoing bowel function and abdominal exam              -  anticipate symptomatic relief within 24 - 48 hours following NGT insertion, followed by "rumbling" the following day and flatus either the same day or the day following the "rumbling" with anticipated length of stay ~3 - 5 days with successful non-operative management for 8 of 10 patients with small bowel obstruction secondary to adhesions  - surgical intervention if doesn't improve was also discussed             - medical management comorbidities             - ambulation encouraged              -  DVT prophylaxis  -- Corene Cornea E. Rosana Hoes, MD, [place]: [facility] - Partnering for exceptional care. Office: [phone]

## 2016-11-28 ENCOUNTER — Inpatient Hospital Stay: Payer: Medicare Other

## 2016-11-28 LAB — CBC
HEMATOCRIT: 34.2 % — AB (ref 35.0–47.0)
Hemoglobin: 12 g/dL (ref 12.0–16.0)
MCH: 30.5 pg (ref 26.0–34.0)
MCHC: 35 g/dL (ref 32.0–36.0)
MCV: 86.9 fL (ref 80.0–100.0)
PLATELETS: 189 10*3/uL (ref 150–440)
RBC: 3.94 MIL/uL (ref 3.80–5.20)
RDW: 13.1 % (ref 11.5–14.5)
WBC: 8.4 10*3/uL (ref 3.6–11.0)

## 2016-11-28 LAB — COMPREHENSIVE METABOLIC PANEL
ALT: 19 U/L (ref 14–54)
AST: 20 U/L (ref 15–41)
Albumin: 2.8 g/dL — ABNORMAL LOW (ref 3.5–5.0)
Alkaline Phosphatase: 106 U/L (ref 38–126)
Anion gap: 5 (ref 5–15)
BUN: 8 mg/dL (ref 6–20)
CHLORIDE: 106 mmol/L (ref 101–111)
CO2: 28 mmol/L (ref 22–32)
Calcium: 8.3 mg/dL — ABNORMAL LOW (ref 8.9–10.3)
Creatinine, Ser: 0.76 mg/dL (ref 0.44–1.00)
Glucose, Bld: 134 mg/dL — ABNORMAL HIGH (ref 65–99)
POTASSIUM: 3.4 mmol/L — AB (ref 3.5–5.1)
Sodium: 139 mmol/L (ref 135–145)
Total Bilirubin: 0.5 mg/dL (ref 0.3–1.2)
Total Protein: 5.7 g/dL — ABNORMAL LOW (ref 6.5–8.1)

## 2016-11-28 LAB — URINE CULTURE: SPECIAL REQUESTS: NORMAL

## 2016-11-28 NOTE — Progress Notes (Signed)
CC: SBO Subjective: This a patient with a partial small bowel obstruction. She thinks she has passed some gas but not very much. She has no abdominal pain today. She's feeling better overall. Denies fevers or chills  Objective: Vital signs in last 24 hours: Temp:  [98 F (36.7 C)-98.4 F (36.9 C)] 98.4 F (36.9 C) (10/11 0553) Pulse Rate:  [69-79] 69 (10/11 0553) Resp:  [16-18] 18 (10/11 0553) BP: (108-125)/(52-61) 118/53 (10/11 0553) SpO2:  [95 %-99 %] 98 % (10/11 0553) Last BM Date: 11/25/16  Intake/Output from previous day: 10/10 0701 - 10/11 0700 In: 3529.8 [I.V.:3529.8] Out: 1100 [Urine:700; Emesis/NG output:400] Intake/Output this shift: Total I/O In: 404.2 [I.V.:404.2] Out: -   Physical exam:  Abdomen is soft but distended and fairly tympanitic but nontender. Calves are nontender Awake alert and oriented Vital signs stable and reviewed  Lab Results: CBC   Recent Labs  11/27/16 0429 11/28/16 0348  WBC 14.5* 8.4  HGB 13.4 12.0  HCT 38.4 34.2*  PLT 213 189   BMET  Recent Labs  11/27/16 0429 11/28/16 0348  NA 138 139  K 4.1 3.4*  CL 104 106  CO2 25 28  GLUCOSE 190* 134*  BUN 12 8  CREATININE 0.95 0.76  CALCIUM 8.6* 8.3*   PT/INR No results for input(s): LABPROT, INR in the last 72 hours. ABG No results for input(s): PHART, HCO3 in the last 72 hours.  Invalid input(s): PCO2, PO2  Studies/Results: Dg Abd 1 View  Result Date: 11/27/2016 CLINICAL DATA:  71 year old female status post NG tube placement. Distal small bowel obstruction. EXAM: ABDOMEN - 1 VIEW COMPARISON:  0422 hours today.  CT Abdomen and Pelvis 11/26/2016 FINDINGS: Portable AP upright view at 0705 hours. Enteric tube courses to the left upper quadrant. Stable side-hole position at the level of the gastric body. Visible bowel gas pattern is stable and within normal limits. Negative visualized chest and mediastinum. IMPRESSION: 1. Stable enteric tube, side hole at the level of the  gastric body. 2. Visible bowel gas pattern has improved since 11/26/2016. Electronically Signed   By: Genevie Ann M.D.   On: 11/27/2016 08:13   Dg Abd 1 View  Result Date: 11/27/2016 CLINICAL DATA:  Nasogastric tube placement.  Initial encounter. EXAM: ABDOMEN - 1 VIEW COMPARISON:  CT of the abdomen and pelvis from 11/26/2016 FINDINGS: The patient's enteric tube is noted ending overlying the body of the stomach. The visualized bowel gas pattern is grossly unremarkable. No free intra-abdominal air is seen on this provided upright view. No acute osseous abnormalities are identified. The visualized lung bases are clear. IMPRESSION: Enteric tube noted ending overlying the body of the stomach. Electronically Signed   By: Garald Balding M.D.   On: 11/27/2016 05:07   Ct Abdomen Pelvis W Contrast  Result Date: 11/26/2016 CLINICAL DATA:  Generalized abdominal pain for 2 days with nausea, constipation yesterday, took Colace then had diarrhea today, abdominal distension, history COPD EXAM: CT ABDOMEN AND PELVIS WITH CONTRAST TECHNIQUE: Multidetector CT imaging of the abdomen and pelvis was performed using the standard protocol following bolus administration of intravenous contrast. Sagittal and coronal MPR images reconstructed from axial data set. CONTRAST:  134mL ISOVUE-300 IOPAMIDOL (ISOVUE-300) INJECTION 61% IV. No oral contrast administered. COMPARISON:  02/20/2014 FINDINGS: Lower chest: Lung bases clear Hepatobiliary: Fatty infiltration of liver. Gallbladder and liver otherwise unremarkable. Pancreas: Normal appearance Spleen: Normal appearance Adrenals/Urinary Tract: Nonobstructing 5 mm RIGHT renal calculus image 36. Adrenal glands, kidneys, ureters, and bladder otherwise normal  appearance. Stomach/Bowel: Appendix not visualized. Dilated proximal and decompressed distal small bowel loops compatible with small bowel obstruction. Transition from dilated to nondilated small bowel occurs in the upper RIGHT pelvis  question due to adhesion. No evidence of perforation or definite mass lesion. Stomach and colon unremarkable. Vascular/Lymphatic: Atherosclerotic calcifications aorta and iliac arteries without aneurysm. Reproductive: Uterus and adnexa normal appearance Other: Low-attenuation free pelvic fluid. No free air. No definite hernia. Musculoskeletal: Osseous demineralization diffusely. IMPRESSION: Distal small bowel obstruction question due to adhesion with transitional zone noted in the upper RIGHT pelvis. Associated free pelvic fluid. Fatty infiltration of liver. Nonobstructing 5 mm RIGHT renal calculus. Aortic Atherosclerosis (ICD10-I70.0). Electronically Signed   By: Lavonia Dana M.D.   On: 11/26/2016 22:12   Dg Abd 2 Views  Result Date: 11/28/2016 CLINICAL DATA:  Small bowel obstruction, persistent abdominal pain and distension EXAM: ABDOMEN - 2 VIEW COMPARISON:  Abdominal radiograph of November 27, 2016 FINDINGS: The esophagogastric tube tip projects in the distal gastric body or pre-pyloric region. There is no gaseous distention of the stomach. There are loops of mildly distended gas-filled small bowel in the midline. No free extraluminal gas collections are observed. There is no significant colonic gas. Some stool is present in the right and left colon. There is contrast within the urinary bladder. A calcification projects over the mid to lower pole of the right kidney. The bony structures exhibit no acute abnormalities. IMPRESSION: Persistent mid to distal small bowel obstruction. No evidence of perforation. The esophagogastric tube is in reasonable position. Electronically Signed   By: David  Martinique M.D.   On: 11/28/2016 08:58    Anti-infectives: Anti-infectives    Start     Dose/Rate Route Frequency Ordered Stop   11/26/16 2115  cefTRIAXone (ROCEPHIN) injection 250 mg  Status:  Discontinued     250 mg Intramuscular  Once 11/26/16 2107 11/26/16 2108   11/26/16 2115  azithromycin (ZITHROMAX) tablet  1,000 mg  Status:  Discontinued     1,000 mg Oral STAT 11/26/16 2107 11/26/16 2108      Assessment/Plan:  Labs reviewed. KUB is personally reviewed. Continued signs of small bowel obstruction. Patient states she passed gas but I see no gas in her colon. At this point she is slightly improved clinically but her x-ray does not look improved. I discussed with her surgical intervention which she does not want versus continued observation which she prefers. I would repeat films tomorrow and if no better than surgery will be indicated. This is reviewed with her.  Florene Glen, MD, FACS  11/28/2016

## 2016-11-29 ENCOUNTER — Inpatient Hospital Stay: Payer: Medicare Other

## 2016-11-29 LAB — CBC WITH DIFFERENTIAL/PLATELET
BASOS ABS: 0 10*3/uL (ref 0–0.1)
Basophils Relative: 1 %
EOS ABS: 0.3 10*3/uL (ref 0–0.7)
EOS PCT: 4 %
HCT: 33.1 % — ABNORMAL LOW (ref 35.0–47.0)
Hemoglobin: 11.6 g/dL — ABNORMAL LOW (ref 12.0–16.0)
Lymphocytes Relative: 29 %
Lymphs Abs: 2 10*3/uL (ref 1.0–3.6)
MCH: 30.7 pg (ref 26.0–34.0)
MCHC: 35.1 g/dL (ref 32.0–36.0)
MCV: 87.4 fL (ref 80.0–100.0)
Monocytes Absolute: 0.6 10*3/uL (ref 0.2–0.9)
Monocytes Relative: 9 %
Neutro Abs: 3.9 10*3/uL (ref 1.4–6.5)
Neutrophils Relative %: 57 %
PLATELETS: 180 10*3/uL (ref 150–440)
RBC: 3.78 MIL/uL — AB (ref 3.80–5.20)
RDW: 13.5 % (ref 11.5–14.5)
WBC: 6.7 10*3/uL (ref 3.6–11.0)

## 2016-11-29 LAB — COMPREHENSIVE METABOLIC PANEL
ALT: 19 U/L (ref 14–54)
AST: 21 U/L (ref 15–41)
Albumin: 2.8 g/dL — ABNORMAL LOW (ref 3.5–5.0)
Alkaline Phosphatase: 97 U/L (ref 38–126)
Anion gap: 3 — ABNORMAL LOW (ref 5–15)
BILIRUBIN TOTAL: 0.5 mg/dL (ref 0.3–1.2)
BUN: 6 mg/dL (ref 6–20)
CO2: 29 mmol/L (ref 22–32)
CREATININE: 0.86 mg/dL (ref 0.44–1.00)
Calcium: 8.3 mg/dL — ABNORMAL LOW (ref 8.9–10.3)
Chloride: 111 mmol/L (ref 101–111)
GFR calc Af Amer: >60 mL/min (ref >60)
Glucose, Bld: 117 mg/dL — ABNORMAL HIGH (ref 65–99)
POTASSIUM: 3.3 mmol/L — AB (ref 3.5–5.1)
Sodium: 143 mmol/L (ref 135–145)
TOTAL PROTEIN: 5.6 g/dL — AB (ref 6.5–8.1)

## 2016-11-29 NOTE — Progress Notes (Signed)
Patient having bowel movements today. Feels much better. Tolerating clear liquids around her nasogastric tube. We will discontinue the NG now

## 2016-11-29 NOTE — Progress Notes (Signed)
CC: Bowel obstruction Subjective: This patient status post small bowel obstruction. She is feeling much better today with no pain and had 2 large bowel movements last night one of which soiled the bed. She is passing gas has no further nausea or vomiting. Ears or chills.  Objective: Vital signs in last 24 hours: Temp:  [97.9 F (36.6 C)-98.4 F (36.9 C)] 98.3 F (36.8 C) (10/12 0502) Pulse Rate:  [64-80] 64 (10/12 0502) Resp:  [19-21] 19 (10/12 0502) BP: (116-125)/(57-62) 125/57 (10/12 0502) SpO2:  [97 %-99 %] 97 % (10/12 0502) Last BM Date: 11/25/16  Intake/Output from previous day: 10/11 0701 - 10/12 0700 In: 2932.7 [P.O.:20; I.V.:2912.7] Out: 850 [Urine:400; Emesis/NG output:450] Intake/Output this shift: No intake/output data recorded.  Physical exam:  Vital signs are stable and reviewed. No acute distress NG tube in place Abdomen is distended and tympanitic but less so than yesterday and essentially nontender. Calves are nontender  Lab Results: CBC   Recent Labs  11/28/16 0348 11/29/16 0321  WBC 8.4 6.7  HGB 12.0 11.6*  HCT 34.2* 33.1*  PLT 189 180   BMET  Recent Labs  11/28/16 0348 11/29/16 0321  NA 139 143  K 3.4* 3.3*  CL 106 111  CO2 28 29  GLUCOSE 134* 117*  BUN 8 6  CREATININE 0.76 0.86  CALCIUM 8.3* 8.3*   PT/INR No results for input(s): LABPROT, INR in the last 72 hours. ABG No results for input(s): PHART, HCO3 in the last 72 hours.  Invalid input(s): PCO2, PO2  Studies/Results: Dg Abd 2 Views  Result Date: 11/29/2016 CLINICAL DATA:  Small bowel obstruction. EXAM: ABDOMEN - 2 VIEW COMPARISON:  11/28/2016 FINDINGS: Significant improvement in bowel gas pattern with some transit of air into the colon and less dilated small bowel present. No free air. Stable positioning of nasogastric tube. IMPRESSION: Resolving bowel obstruction. Electronically Signed   By: Aletta Edouard M.D.   On: 11/29/2016 08:13   Dg Abd 2 Views  Result Date:  11/28/2016 CLINICAL DATA:  Small bowel obstruction, persistent abdominal pain and distension EXAM: ABDOMEN - 2 VIEW COMPARISON:  Abdominal radiograph of November 27, 2016 FINDINGS: The esophagogastric tube tip projects in the distal gastric body or pre-pyloric region. There is no gaseous distention of the stomach. There are loops of mildly distended gas-filled small bowel in the midline. No free extraluminal gas collections are observed. There is no significant colonic gas. Some stool is present in the right and left colon. There is contrast within the urinary bladder. A calcification projects over the mid to lower pole of the right kidney. The bony structures exhibit no acute abnormalities. IMPRESSION: Persistent mid to distal small bowel obstruction. No evidence of perforation. The esophagogastric tube is in reasonable position. Electronically Signed   By: David  Martinique M.D.   On: 11/28/2016 08:58    Anti-infectives: Anti-infectives    Start     Dose/Rate Route Frequency Ordered Stop   11/26/16 2115  cefTRIAXone (ROCEPHIN) injection 250 mg  Status:  Discontinued     250 mg Intramuscular  Once 11/26/16 2107 11/26/16 2108   11/26/16 2115  azithromycin (ZITHROMAX) tablet 1,000 mg  Status:  Discontinued     1,000 mg Oral STAT 11/26/16 2107 11/26/16 2108      Assessment/Plan:  KUB is personally reviewed. There is some improvement noted. With the patient's multiple bowel movements and passage of gas and improvement although she is still distended I will discontinue her nasogastric tube today and start clears.  She understands that she may require replacement of the nasogastric tube should she worsen. If she did worsen she might require surgical intervention. She understood and agreed with this plan.  Florene Glen, MD, FACS  11/29/2016

## 2016-11-29 NOTE — Plan of Care (Signed)
Problem: Nutrition: Goal: Adequate nutrition will be maintained Outcome: Progressing pts diet progressed to clear liquid from npo. Pt tolerated well

## 2016-11-29 NOTE — Care Management Important Message (Signed)
Important Message  Patient Details  Name: Kristie Walters MRN: [id number] Date of Birth: [birth year]   Medicare Important Message Given:  Yes    Beverly Sessions, RN 11/29/2016, 1:45 PM

## 2016-11-30 MED ORDER — ATORVASTATIN CALCIUM 10 MG PO TABS
10.0000 mg | ORAL_TABLET | Freq: Every day | ORAL | Status: DC
  Administered 2016-11-30: 10 mg via ORAL
  Filled 2016-11-30: qty 1

## 2016-11-30 MED ORDER — PANTOPRAZOLE SODIUM 40 MG PO TBEC
40.0000 mg | DELAYED_RELEASE_TABLET | Freq: Every day | ORAL | Status: DC
  Administered 2016-11-30 – 2016-12-01 (×2): 40 mg via ORAL
  Filled 2016-11-30 (×2): qty 1

## 2016-11-30 MED ORDER — ASPIRIN 81 MG PO CHEW
81.0000 mg | CHEWABLE_TABLET | Freq: Every day | ORAL | Status: DC
  Administered 2016-11-30 – 2016-12-01 (×2): 81 mg via ORAL
  Filled 2016-11-30 (×2): qty 1

## 2016-11-30 NOTE — Progress Notes (Signed)
SURGICAL PROGRESS NOTE (cpt [id number])  Hospital Day(s): 4.   Post op day(s):  [name]   Interval History: Patient seen and examined, no acute events or new complaints overnight. Patient reports +flatus and +BM, denies N/V or abdominal pain, but does describe feeling "bloated" and not hungry. She otherwise denies fever/chills, CP, or SOB, and has ambulated.  Review of Systems:  Constitutional: denies fever, chills  HEENT: denies cough or congestion  Respiratory: denies any shortness of breath  Cardiovascular: denies chest pain or palpitations  Gastrointestinal: abdominal pain, N/V, and bowel function as per interval history Genitourinary: denies burning with urination or urinary frequency Musculoskeletal: denies pain, decreased motor or sensation Integumentary: denies any other rashes or skin discolorations Neurological: denies HA or vision/hearing changes   Vital signs in last 24 hours: [min-max] current  Temp:  [98.1 F (36.7 C)-98.4 F (36.9 C)] 98.4 F (36.9 C) (10/13 0448) Pulse Rate:  [68-70] 70 (10/13 0448) Resp:  [17-18] 17 (10/13 0448) BP: (123-136)/(61-81) 123/81 (10/13 0448) SpO2:  [95 %-98 %] 97 % (10/13 0448)     Height: 5\' 11"  (180.3 cm) Weight: 155 lb (70.3 kg) BMI (Calculated): 21.63   Intake/Output this shift:  Total I/O In: 750 [P.O.:750] Out: 0    Intake/Output last 2 shifts:  @IOLAST2SHIFTS @   Physical Exam:  Constitutional: alert, cooperative and no distress  HENT: normocephalic without obvious abnormality  Eyes: PERRL, EOM's grossly intact and symmetric  Neuro: CN II - XII grossly intact and symmetric without deficit  Respiratory: breathing non-labored at rest  Cardiovascular: regular rate and sinus rhythm  Gastrointestinal: soft and non-tender, mildly distended Musculoskeletal: UE and LE FROM, no edema or wounds, motor and sensation grossly intact, NT   Labs:  CBC Latest Ref Rng & Units 11/29/2016 11/28/2016 11/27/2016  WBC 3.6 - 11.0 K/uL 6.7 8.4  14.5(H)  Hemoglobin 12.0 - 16.0 g/dL 11.6(L) 12.0 13.4  Hematocrit 35.0 - 47.0 % 33.1(L) 34.2(L) 38.4  Platelets 150 - 440 K/uL 180 189 213   CMP Latest Ref Rng & Units 11/29/2016 11/28/2016 11/27/2016  Glucose 65 - 99 mg/dL 117(H) 134(H) 190(H)  BUN 6 - 20 mg/dL 6 8 12   Creatinine 0.44 - 1.00 mg/dL 0.86 0.76 0.95  Sodium 135 - 145 mmol/L 143 139 138  Potassium 3.5 - 5.1 mmol/L 3.3(L) 3.4(L) 4.1  Chloride 101 - 111 mmol/L 111 106 104  CO2 22 - 32 mmol/L 29 28 25   Calcium 8.9 - 10.3 mg/dL 8.3(L) 8.3(L) 8.6(L)  Total Protein 6.5 - 8.1 g/dL 5.6(L) 5.7(L) 6.3(L)  Total Bilirubin 0.3 - 1.2 mg/dL 0.5 0.5 0.7  Alkaline Phos 38 - 126 U/L 97 106 126  AST 15 - 41 U/L 21 20 27   ALT 14 - 54 U/L 19 19 23    Imaging studies: No new pertinent imaging studies   Assessment/Plan: (ICD-10's: K60.52) 71 y.o. female with resolving complete small bowel obstruction, likely attributable to post-surgical adhesions following remote open surgical tubal ligation, complicated by pertinent comorbidities including COPD, cerebrovascular disease s/p Left CEA (2016), collagen vascular disease, chronic ongoing tobacco abuse (smoking), and GERD.              - medical management comorbidities             - monitor ongoing bowel function and abdominal exam              - advance to full liquids diet and as tolerated to soft diet this afternoon/evening  - discharge planning for likely  tomorrow if continues improving             - ambulation encouraged              - DVT prophylaxis  All of the above findings and recommendations were discussed with the patient and patient's RN, and all of patient's questions were answered to her expressed satisfaction.  -- Marilynne Drivers Rosana Hoes, MD, [place]: [facility] - Partnering for exceptional care. Office: [phone]

## 2016-12-01 NOTE — Progress Notes (Signed)
Patient left via wheelchair escorted by NT.

## 2016-12-01 NOTE — Discharge Summary (Signed)
Physician Discharge Summary  Patient ID: OLUWAKEMI SALSBERRY MRN: [id number] DOB/AGE: 1946/02/04 71 y.o.  Admit date: 11/26/2016 Discharge date: 12/01/2016  Admission Diagnoses:  Discharge Diagnoses:  Active Problems:   SBO (small bowel obstruction) ([place])   Discharged Condition: good  Hospital Course: 71 y.o. female presented to [facility] ED for abdominal pain. Workup was found to be significant for CT imaging demonstrating SBO. NG tube was inserted, and patient's pain and nausea improved/resolved, flatus and BM's resumed, NG tube was removed, and advancement of patient's diet and ambulation were well-tolerated. The remainder of patient's hospital course was essentially unremarkable, and discharge planning was initiated accordingly with patient safely able to be discharged home with appropriate discharge instructions after all of her questions were answered to her expressed satisfaction.  Consults: None  Significant Diagnostic Studies: radiology: CT scan: small bowel obstruction  Treatments: IV hydration and procedures: insertion of nasogastric tube for GI decompression  Discharge Exam: Blood pressure (!) 118/53, pulse 63, temperature 98.4 F (36.9 C), temperature source Oral, resp. rate 16, height 5\' 11"  (1.803 m), weight 155 lb (70.3 kg), SpO2 96 %. General appearance: alert, cooperative and no distress GI: soft, non-tender; bowel sounds normal; no masses,  no organomegaly  Disposition: 01-Home or Self Care   Allergies as of 12/01/2016      Reactions   Morphine Nausea Only, Nausea And Vomiting   Morphine And Related Nausea And Vomiting      Medication List    TAKE these medications   albuterol 108 (90 Base) MCG/ACT inhaler Commonly known as:  PROVENTIL HFA;VENTOLIN HFA Inhale 2 puffs into the lungs every 6 (six) hours as needed for wheezing or shortness of breath.   aspirin 81 MG tablet Take 81 mg by mouth daily.   atorvastatin 10 MG tablet Commonly known as:  LIPITOR TAKE 1  TABLET BY MOUTH EVERY DAY   FISH OIL + D3 PO Take 1,000 mg by mouth 1 day or 1 dose.   fluticasone 50 MCG/ACT nasal spray Commonly known as:  FLONASE Place 2 sprays into both nostrils daily.   omeprazole 40 MG capsule Commonly known as:  PRILOSEC Take 40 mg by mouth daily.      Follow-up [facility].   Specialty:  General Surgery Why:  You do not need to schedule surgical follow-up, but do not hesitate to call our office if you have any surgical questions or concerns. Contact information: [address] [place] [phone]          Signed: Vickie Epley 12/01/2016, 6:44 AM

## 2016-12-01 NOTE — Progress Notes (Signed)
MD ordered patient to be discharged home.  Discharge instructions were reviewed with the patient and she voiced understanding.    No prescriptions given to the patient.  IV was removed with catheter intact.  All patients questions were answered.  Patient waiting on her son for a ride.

## 2016-12-23 DIAGNOSIS — K5652 Intestinal adhesions [bands] with complete obstruction: Secondary | ICD-10-CM

## 2017-06-13 ENCOUNTER — Encounter: Payer: Medicare Other | Admitting: Family Medicine

## 2017-06-17 ENCOUNTER — Other Ambulatory Visit: Payer: Self-pay | Admitting: Nurse Practitioner

## 2017-06-17 ENCOUNTER — Encounter: Payer: Self-pay | Admitting: Nurse Practitioner

## 2017-06-17 ENCOUNTER — Ambulatory Visit
Admission: RE | Admit: 2017-06-17 | Discharge: 2017-06-17 | Disposition: A | Discharge status: Home / Self Care | Payer: Medicare Other | Source: Ambulatory Visit | Attending: Nurse Practitioner | Admitting: Nurse Practitioner

## 2017-06-17 ENCOUNTER — Ambulatory Visit (INDEPENDENT_AMBULATORY_CARE_PROVIDER_SITE_OTHER): Payer: Medicare Other | Admitting: Nurse Practitioner

## 2017-06-17 ENCOUNTER — Other Ambulatory Visit
Admission: RE | Admit: 2017-06-17 | Discharge: 2017-06-17 | Disposition: A | Discharge status: Home / Self Care | Payer: Medicare Other | Source: Ambulatory Visit | Attending: Nurse Practitioner | Admitting: Nurse Practitioner

## 2017-06-17 VITALS — BP 120/82 | HR 97 | Temp 98.5°F | Resp 16 | Ht 71.0 in | Wt 159.1 lb

## 2017-06-17 DIAGNOSIS — R3989 Other symptoms and signs involving the genitourinary system: Secondary | ICD-10-CM

## 2017-06-17 DIAGNOSIS — R1031 Right lower quadrant pain: Secondary | ICD-10-CM

## 2017-06-17 DIAGNOSIS — K219 Gastro-esophageal reflux disease without esophagitis: Secondary | ICD-10-CM

## 2017-06-17 DIAGNOSIS — Z8719 Personal history of other diseases of the digestive system: Secondary | ICD-10-CM

## 2017-06-17 DIAGNOSIS — R140 Abdominal distension (gaseous): Secondary | ICD-10-CM | POA: Diagnosis not present

## 2017-06-17 LAB — POCT URINALYSIS DIPSTICK
Bilirubin, UA: NEGATIVE
Glucose, UA: NEGATIVE
Ketones, UA: NEGATIVE
LEUKOCYTES UA: NEGATIVE
NITRITE UA: NEGATIVE
Protein, UA: NEGATIVE
RBC UA: NEGATIVE
SPEC GRAV UA: 1.02 (ref 1.010–1.025)
Urobilinogen, UA: NEGATIVE E.U./dL — AB
pH, UA: 5 (ref 5.0–8.0)

## 2017-06-17 LAB — COMPREHENSIVE METABOLIC PANEL
ALBUMIN: 4 g/dL (ref 3.5–5.0)
ALT: 32 U/L (ref 14–54)
ANION GAP: 6 (ref 5–15)
AST: 24 U/L (ref 15–41)
Alkaline Phosphatase: 163 U/L — ABNORMAL HIGH (ref 38–126)
BUN: 16 mg/dL (ref 6–20)
CO2: 27 mmol/L (ref 22–32)
Calcium: 9 mg/dL (ref 8.9–10.3)
Chloride: 106 mmol/L (ref 101–111)
Creatinine, Ser: 0.86 mg/dL (ref 0.44–1.00)
GFR calc Af Amer: >60 mL/min (ref >60)
GFR calc non Af Amer: >60 mL/min (ref >60)
GLUCOSE: 99 mg/dL (ref 65–99)
POTASSIUM: 4.1 mmol/L (ref 3.5–5.1)
Sodium: 139 mmol/L (ref 135–145)
TOTAL PROTEIN: 7.3 g/dL (ref 6.5–8.1)
Total Bilirubin: 0.5 mg/dL (ref 0.3–1.2)

## 2017-06-17 LAB — CBC
HCT: 39.1 % (ref 35.0–47.0)
Hemoglobin: 13.4 g/dL (ref 12.0–16.0)
MCH: 29.4 pg (ref 26.0–34.0)
MCHC: 34.2 g/dL (ref 32.0–36.0)
MCV: 85.9 fL (ref 80.0–100.0)
PLATELETS: 225 10*3/uL (ref 150–440)
RBC: 4.56 MIL/uL (ref 3.80–5.20)
RDW: 13.4 % (ref 11.5–14.5)
WBC: 10.4 10*3/uL (ref 3.6–11.0)

## 2017-06-17 LAB — LIPASE, BLOOD: LIPASE: 28 U/L (ref 11–51)

## 2017-06-17 NOTE — Patient Instructions (Addendum)
-   Go over to the medical mall at the hospital to have your xray completed. - They will keep you there until it is read     - if its negative we will have you get blood work there and a follow-up CT of your abdomen in 1- 2 weeks - If it is positive we will have you got to the ER for further work-up and labs  Abdominal Pain, Adult Many things can cause belly (abdominal) pain. Most times, belly pain is not dangerous. Many cases of belly pain can be watched and treated at home. Sometimes belly pain is serious, though. Your doctor will try to find the cause of your belly pain. Follow these instructions at home:  Take over-the-counter and prescription medicines only as told by your doctor. Do not take medicines that help you poop (laxatives) unless told to by your doctor.  Drink enough fluid to keep your pee (urine) clear or pale yellow.  Watch your belly pain for any changes.  Keep all follow-up visits as told by your doctor. This is important. Contact a doctor if:  Your belly pain changes or gets worse.  You are not hungry, or you lose weight without trying.  You are having trouble pooping (constipated) or have watery poop (diarrhea) for more than 2-3 days.  You have pain when you pee or poop.  Your belly pain wakes you up at night.  Your pain gets worse with meals, after eating, or with certain foods.  You are throwing up and cannot keep anything down.  You have a fever. Get help right away if:  Your pain does not go away as soon as your doctor says it should.  You cannot stop throwing up.  Your pain is only in areas of your belly, such as the right side or the left lower part of the belly.  You have bloody or black poop, or poop that looks like tar.  You have very bad pain, cramping, or bloating in your belly.  You have signs of not having enough fluid or water in your body (dehydration), such as: ? Dark pee, very little pee, or no pee. ? Cracked lips. ? Dry  mouth. ? Sunken eyes. ? Sleepiness. ? Weakness. This information is not intended to replace advice given to you by your health care provider. Make sure you discuss any questions you have with your health care provider. Document Released: 07/24/2007 Document Revised: 08/25/2015 Document Reviewed: 07/19/2015 Elsevier Interactive Patient Education  2018 [employer].

## 2017-06-17 NOTE — Progress Notes (Signed)
Name: Kristie Walters   MRN: [id number]    DOB: 04/25/1945   Date:06/17/2017       Progress Note  Subjective  Chief Complaint  Chief Complaint  Patient presents with  . Abdominal Pain    for 2 weeks, Feels like a knot is in her stomach. Pain right lower quadrant that radiates to groin. Had bowel blockage in Oct 2018    HPI  Right sided abdominal pain upper and lower started about 2 weeks ago. Feels knot in right lower stomach. Endorses mild intermittent nausea. Denies vomiting, diarrhea or constipation. sts last year had blockage requiring NG tube and PO meds- sts feels similar but she has had daily bowel movements- states mild straining but not much but smaller amounts then usual. [place] 3-4 scale. Patient has not noticed any aggravating or alleviating factors. Pain is described as 5/10 constant pain that occasionally gets worse. Described as uncomfortable but tolerable. States when she crosses legs she feels knot in right lower part of abdomen.  Patient states feels bloated all the time and more bloated after eating but pain not effected by food. Denies shob, fevers, chest pain, chills, vaginal bleeding/discharge. Endorses mild stinging in ureteral area after urination.  Father died of liver cancer- no hx of etoh use or hepatitis; siblings had gall bladder issues.   Patient Active Problem List   Diagnosis Date Noted  . 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 Small bowel obstruction ([place])   . Acute non-recurrent maxillary sinusitis 02/27/2016  . Aortic atherosclerosis ([place]) 09/14/2015  . Personal history of tobacco use, presenting hazards to health 08/31/2015  . Paresthesia of foot, bilateral 08/15/2015  . Well woman exam 07/12/2015  . Breast mass, right 07/12/2015  . Tobacco abuse 07/12/2015  . Screening for osteoporosis 07/12/2015  . Tick bite of groin 06/29/2015  . Elevated alkaline phosphatase level 05/29/2015  . COPD (chronic obstructive pulmonary disease) ([place])  04/26/2015  . GERD (gastroesophageal reflux disease) 04/26/2015  . Elevated blood pressure (not hypertension) 04/26/2015  . Hyperlipidemia 04/26/2015  . Hyperglycemia 04/26/2015  . Carotid stenosis 08/25/2014  . H/O malignant neoplasm of skin 08/13/2012    Past Medical History:  Diagnosis Date  . Carotid artery disease ([place])    s/p L. CEA in July 2016  . Collagen vascular disease ([place])    Left carotid stenosis  . COPD (chronic obstructive pulmonary disease) ([place])   . Elevated blood pressure (not hypertension)   . GERD (gastroesophageal reflux disease)   . Personal history of tobacco use, presenting hazards to health 08/31/2015  . PONV (postoperative nausea and vomiting)     Past Surgical History:  Procedure Laterality Date  . ENDARTERECTOMY Left 08/25/2014   Procedure: ENDARTERECTOMY CAROTID;  Surgeon: Algernon Huxley, MD;  Location: [facility];  Service: Vascular;  Laterality: Left;  . MOHS SURGERY    . PERIPHERAL VASCULAR CATHETERIZATION N/A 07/20/2014   Procedure: Carotid Angiography;  Surgeon: Algernon Huxley, MD;  Location: [facility];  Service: Cardiovascular;  Laterality: N/A;  . TONSILLECTOMY    . TUBAL LIGATION      Social History   Tobacco Use  . Smoking status: Former Smoker    Packs/day: 1.00    Years: 55.00    Pack years: 55.00    Types: Cigarettes    Last attempt to quit: 11/26/2016    Years since quitting: 0.5  . Smokeless tobacco: Current User  Substance Use Topics  . Alcohol use: No  Current Outpatient Medications:  .  albuterol (PROVENTIL HFA;VENTOLIN HFA) 108 (90 Base) MCG/ACT inhaler, Inhale 2 puffs into the lungs every 6 (six) hours as needed for wheezing or shortness of breath., Disp: 2 Inhaler, Rfl: 1 .  aspirin 81 MG tablet, Take 81 mg by mouth daily., Disp: , Rfl:  .  atorvastatin (LIPITOR) 10 MG tablet, TAKE 1 TABLET BY MOUTH EVERY DAY, Disp: 90 tablet, Rfl: 3 .  Fish Oil-Cholecalciferol (FISH OIL + D3 PO), Take 1,000 mg by mouth 1 day or 1  dose., Disp: , Rfl:  .  fluticasone (FLONASE) 50 MCG/ACT nasal spray, Place 2 sprays into both nostrils daily., Disp: 16 g, Rfl: 6 .  omeprazole (PRILOSEC) 40 MG capsule, Take 40 mg by mouth daily., Disp: , Rfl:   Allergies  Allergen Reactions  . Morphine Nausea Only and Nausea And Vomiting  . Morphine And Related Nausea And Vomiting    ROS  Constitutional: Negative for fever or weight change.  Respiratory: Negative for cough and shortness of breath.   Cardiovascular: Negative for chest pain or palpitations.  Gastrointestinal: Positive for abdominal pain, no bowel changes.  Musculoskeletal: Negative for gait problem or joint swelling.  Skin: Negative for rash.  Neurological: Negative for dizziness or headache.  No other specific complaints in a complete review of systems (except as listed in HPI above).  Objective  Vitals:   06/17/17 1438  BP: 120/82  Pulse: 97  Resp: 16  Temp: 98.5 F (36.9 C)  TempSrc: Oral  SpO2: 94%  Weight: 159 lb 1.6 oz (72.2 kg)  Height: 5\' 11"  (1.803 m)    Body mass index is 22.19 kg/m.  Nursing Note and Vital Signs reviewed.  Physical Exam  Constitutional: Patient appears well-developed and well-nourished.  No distress.  HE: head atraumatic, normocephalic, conjunctiva clear,  Cardiovascular: Normal rate, regular rhythm, S1/S2 present.  No murmur or rub heard.  Pulmonary/Chest: Effort normal and breath sounds clear. No respiratory distress or retractions. Abdominal: abdomen distended, BS + in all 4 quadrants patient has generalizes right mid and lower quadrant abdominal pain, no rebound tenderness, negative murphy's sign, no redness, rash or bruising noted. No CVA tenderness, no swollen inguinal lymph nodes, no hernias palpated. Rectal tone intact, no stool burden noted or hemorrhoids. Negative occult stool testing.  Psychiatric: Patient has a normal mood and affect. behavior is normal. Judgment and thought content normal.  No results found  for this or any previous visit (from the past 72 hour(s)).  Assessment & Plan  1. Right lower quadrant abdominal pain - COMPLETE METABOLIC PANEL WITH GFR - Lipase - CBC - DG Abd 1 View; Future - POCT occult blood stool  2. Urethral pain - POCT urinalysis dipstick  3. History of small bowel obstruction  - DG Abd 1 View; Future   4. Gastroesophageal reflux disease, esophagitis presence not specified Continue omeprazole, monitor food interactions with pain.   - pt denied medications of nausea, states does not like to be on medications and would rather find cause. STAT KUB. Will send to ER if positive for bowel obstruction and order CT abdomen in 1-2 weeks if negative. Discuss further pain management pending lab results.    -Red flags and when to present for emergency care or RTC including fever >101.63F, chest pain, shortness of breath, new/worsening/un-resolving symptoms, severe abdominal pain, nausea, vomiting, unable to defecate with abdominal pain  reviewed with patient at time of visit. Follow up and care instructions discussed and provided in AVS.

## 2017-06-18 LAB — HEMOCCULT GUIAC POC 1CARD (OFFICE): Fecal Occult Blood, POC: NEGATIVE

## 2017-06-19 ENCOUNTER — Other Ambulatory Visit: Payer: Self-pay | Admitting: Nurse Practitioner

## 2017-06-19 DIAGNOSIS — R140 Abdominal distension (gaseous): Secondary | ICD-10-CM

## 2017-07-18 ENCOUNTER — Ambulatory Visit (INDEPENDENT_AMBULATORY_CARE_PROVIDER_SITE_OTHER): Payer: Medicare Other

## 2017-07-18 ENCOUNTER — Encounter (INDEPENDENT_AMBULATORY_CARE_PROVIDER_SITE_OTHER): Payer: Self-pay | Admitting: Vascular Surgery

## 2017-07-18 ENCOUNTER — Ambulatory Visit (INDEPENDENT_AMBULATORY_CARE_PROVIDER_SITE_OTHER): Payer: Medicare Other | Admitting: Vascular Surgery

## 2017-07-18 VITALS — BP 148/78 | HR 67 | Resp 16 | Ht 70.0 in | Wt 158.2 lb

## 2017-07-18 DIAGNOSIS — E785 Hyperlipidemia, unspecified: Secondary | ICD-10-CM | POA: Diagnosis not present

## 2017-07-18 DIAGNOSIS — I6523 Occlusion and stenosis of bilateral carotid arteries: Secondary | ICD-10-CM

## 2017-07-18 NOTE — Assessment & Plan Note (Signed)
Her carotid duplex today shows only mild plaque in the right carotid artery with near normal flow and no significant stenosis.  Her left carotid endarterectomy site shows mild intimal thickening with normal velocities and no significant restenosis. Continue aspirin and statin agent.  Recheck in 1 year

## 2017-07-18 NOTE — Patient Instructions (Signed)
Carotid Artery Disease The carotid arteries are arteries on both sides of the neck. They carry blood to the brain. Carotid artery disease is when the arteries get smaller (narrow) or get blocked. If these arteries get smaller or get blocked, you are more likely to have a stroke or warning stroke (transient ischemic attack). Follow these instructions at home:  Take medicines as told by your doctor. Make sure you understand all your medicine instructions. Do not stop your medicines without talking to your doctor first.  Follow your doctor's diet instructions. It is important to eat a healthy diet that includes plenty of: ? Fresh fruits. ? Vegetables. ? Lean meats.  Avoid: ? High-fat foods. ? High-sodium foods. ? Foods that are fried, overly processed, or have poor nutritional value.  Stay a healthy weight.  Stay active. Get at least 30 minutes of activity every day.  Do not smoke.  Limit alcohol use to: ? No more than 2 drinks a day for men. ? No more than 1 drink a day for women who are not pregnant.  Do not use illegal drugs.  Keep all doctor visits as told. Get help right away if:  You have sudden weakness or loss of feeling (numbness) on one side of the body, such as the face, arm, or leg.  You have sudden confusion.  You have trouble speaking (aphasia) or understanding.  You have sudden trouble seeing out of one or both eyes.  You have sudden trouble walking.  You have dizziness or feel like you might pass out (faint).  You have a loss of balance or your movements are not steady (uncoordinated).  You have a sudden, severe headache with no known cause.  You have trouble swallowing (dysphagia). Call your local emergency services (911 in [place] Do notdrive yourself to the clinic or hospital. This information is not intended to replace advice given to you by your health care provider. Make sure you discuss any questions you have with your health care  provider. Document Released: 01/22/2012 Document Revised: 07/13/2015 Document Reviewed: 08/05/2012 Elsevier Interactive Patient Education  2018 [employer]vier Inc.  

## 2017-07-18 NOTE — Progress Notes (Signed)
MRN : [id number]  Kristie Walters is a 72 y.o. (02/07/1946) female who presents with chief complaint of  Chief Complaint  Patient presents with  . Follow-up    64yr carotid  .  History of Present Illness: Patient returns in follow-up of her carotid disease.  She is about 3 years status post left carotid endarterectomy.  She is doing well today.  She has stopped smoking and we had a long discussion today congratulating her on this.  I am very proud of her.  Her carotid duplex today shows only mild plaque in the right carotid artery with near normal flow and no significant stenosis.  Her left carotid endarterectomy site shows mild intimal thickening with normal velocities and no significant restenosis.  Current Outpatient Medications  Medication Sig Dispense Refill  . albuterol (PROVENTIL HFA;VENTOLIN HFA) 108 (90 Base) MCG/ACT inhaler Inhale 2 puffs into the lungs every 6 (six) hours as needed for wheezing or shortness of breath. 2 Inhaler 1  . aspirin 81 MG tablet Take 81 mg by mouth daily.    [name] atorvastatin (LIPITOR) 10 MG tablet TAKE 1 TABLET BY MOUTH EVERY DAY 90 tablet 3  . Fish Oil-Cholecalciferol (FISH OIL + D3 PO) Take 1,000 mg by mouth 1 day or 1 dose.    . fluticasone (FLONASE) 50 MCG/ACT nasal spray Place 2 sprays into both nostrils daily. 16 g 6  . omeprazole (PRILOSEC) 40 MG capsule Take 40 mg by mouth daily.     No current facility-administered medications for this visit.     Past Medical History:  Diagnosis Date  . Carotid artery disease ([place])    s/p L. CEA in July 2016  . Collagen vascular disease ([place])    Left carotid stenosis  . COPD (chronic obstructive pulmonary disease) ([place])   . Elevated blood pressure (not hypertension)   . GERD (gastroesophageal reflux disease)   . Personal history of tobacco use, presenting hazards to health 08/31/2015  . PONV (postoperative nausea and vomiting)     Past Surgical History:  Procedure Laterality Date  . ENDARTERECTOMY Left  08/25/2014   Procedure: ENDARTERECTOMY CAROTID;  Surgeon: Algernon Huxley, MD;  Location: [facility];  Service: Vascular;  Laterality: Left;  . MOHS SURGERY    . PERIPHERAL VASCULAR CATHETERIZATION N/A 07/20/2014   Procedure: Carotid Angiography;  Surgeon: Algernon Huxley, MD;  Location: [facility];  Service: Cardiovascular;  Laterality: N/A;  . TONSILLECTOMY    . TUBAL LIGATION      Social History Social History   Tobacco Use  . Smoking status: Former Smoker    Packs/day: 1.00    Years: 55.00    Pack years: 55.00    Types: Cigarettes    Last attempt to quit: 11/26/2016    Years since quitting: 0.6  . Smokeless tobacco: Current User  Substance Use Topics  . Alcohol use: No  . Drug use: No     Family History Family History  Problem Relation Age of Onset  . Heart attack Mother   . Hypercholesterolemia Mother   . Hypertension Mother   . Peripheral vascular disease Mother   . Dementia Mother   . Hypothyroidism Mother   . CVA Father   . Liver cancer Father   . Diabetes Brother      Allergies  Allergen Reactions  . Morphine Nausea Only and Nausea And Vomiting  . Morphine And Related Nausea And Vomiting     REVIEW OF SYSTEMS (Negative unless  checked)  Constitutional: [] Weight loss  [] Fever  [] Chills Cardiac: [] Chest pain   [] Chest pressure   [] Palpitations   [] Shortness of breath when laying flat   [] Shortness of breath at rest   [x] Shortness of breath with exertion. Vascular:  [] Pain in legs with walking   [] Pain in legs at rest   [] Pain in legs when laying flat   [] Claudication   [] Pain in feet when walking  [] Pain in feet at rest  [] Pain in feet when laying flat   [] History of DVT   [] Phlebitis   [] Swelling in legs   [] Varicose veins   [] Non-healing ulcers Pulmonary:   [] Uses home oxygen   [] Productive cough   [] Hemoptysis   [] Wheeze  [x] COPD   [] Asthma Neurologic:  [] Dizziness  [] Blackouts   [] Seizures   [] History of stroke   [] History of TIA  [] Aphasia   [] Temporary  blindness   [] Dysphagia   [] Weakness or numbness in arms   [] Weakness or numbness in legs Musculoskeletal:  [] Arthritis   [] Joint swelling   [] Joint pain   [] Low back pain Hematologic:  [] Easy bruising  [] Easy bleeding   [] Hypercoagulable state   [] Anemic  [] Hepatitis Gastrointestinal:  [] Blood in stool   [] Vomiting blood  [x] Gastroesophageal reflux/heartburn   [] Difficulty swallowing. Genitourinary:  [] Chronic kidney disease   [] Difficult urination  [] Frequent urination  [] Burning with urination   [] Blood in urine Skin:  [] Rashes   [] Ulcers   [] Wounds Psychological:  [] History of anxiety   []  History of major depression.   Physical Examination  Vitals:   07/18/17 1005 07/18/17 1006  BP: 137/77 (!) 148/78  Pulse: 67   Resp: 16   Weight: 158 lb 3.2 oz (71.8 kg)   Height: 5\' 10"  (1.778 m)    Body mass index is 22.7 kg/m. Gen:  WD/WN, NAD Head: [place]/AT, No temporalis wasting. Ear/Nose/Throat: Hearing grossly intact, nares w/o erythema or drainage, trachea midline Eyes: Conjunctiva clear. Sclera non-icteric Neck: Supple.  No bruit.  Left CEA incision is well-healed Pulmonary:  Good air movement, equal and clear to auscultation bilaterally.  Cardiac: RRR, No JVD Vascular:  Vessel Right Left  Radial Palpable Palpable                                     Musculoskeletal: M/S 5/5 throughout.  No deformity or atrophy.  No edema. Neurologic: CN 2-12 intact. Sensation grossly intact in extremities.  Symmetrical.  Speech is fluent. Motor exam as listed above. Psychiatric: Judgment intact, Mood & affect appropriate for pt's clinical situation. Dermatologic: No rashes or ulcers noted.  No cellulitis or open wounds.      CBC Lab Results  Component Value Date   WBC 10.4 06/17/2017   HGB 13.4 06/17/2017   HCT 39.1 06/17/2017   MCV 85.9 06/17/2017   PLT 225 06/17/2017    BMET    Component Value Date/Time   NA 139 06/17/2017 1650   NA 141 06/29/2015 1219   NA 141 02/20/2014  1016   K 4.1 06/17/2017 1650   K 4.0 02/20/2014 1016   CL 106 06/17/2017 1650   CL 106 02/20/2014 1016   CO2 27 06/17/2017 1650   CO2 31 02/20/2014 1016   GLUCOSE 99 06/17/2017 1650   GLUCOSE 92 02/20/2014 1016   BUN 16 06/17/2017 1650   BUN 11 06/29/2015 1219   BUN 11 02/20/2014 1016   CREATININE 0.86 06/17/2017 1650  CREATININE 0.95 02/20/2014 1016   CALCIUM 9.0 06/17/2017 1650   CALCIUM 9.2 02/20/2014 1016   GFRNONAA >60 06/17/2017 1650   GFRNONAA >60 02/20/2014 1016   GFRNONAA >60 06/24/2013 0010   GFRAA >60 06/17/2017 1650   GFRAA >60 02/20/2014 1016   GFRAA >60 06/24/2013 0010   CrCl cannot be calculated (Patient's most recent lab result is older than the maximum 21 days allowed.).  COAG Lab Results  Component Value Date   INR 1.0 02/20/2014    Radiology No results found.   Assessment/Plan Hyperlipidemia lipid control important in reducing the progression of atherosclerotic disease. Continue statin therapy   Carotid stenosis Her carotid duplex today shows only mild plaque in the right carotid artery with near normal flow and no significant stenosis.  Her left carotid endarterectomy site shows mild intimal thickening with normal velocities and no significant restenosis. Continue aspirin and statin agent.  Recheck in 1 year    Leotis Pain, MD  07/18/2017 10:54 AM    This note was created with Dragon medical transcription system.  Any errors from dictation are purely unintentional

## 2017-07-25 ENCOUNTER — Ambulatory Visit (INDEPENDENT_AMBULATORY_CARE_PROVIDER_SITE_OTHER): Payer: Medicare Other

## 2017-07-25 VITALS — BP 122/62 | HR 78 | Temp 98.4°F | Resp 12 | Ht 71.0 in | Wt 158.4 lb

## 2017-07-25 DIAGNOSIS — Z1231 Encounter for screening mammogram for malignant neoplasm of breast: Secondary | ICD-10-CM | POA: Diagnosis not present

## 2017-07-25 DIAGNOSIS — Z9181 History of falling: Secondary | ICD-10-CM | POA: Diagnosis not present

## 2017-07-25 DIAGNOSIS — Z0000 Encounter for general adult medical examination without abnormal findings: Secondary | ICD-10-CM | POA: Diagnosis not present

## 2017-07-25 DIAGNOSIS — Z1239 Encounter for other screening for malignant neoplasm of breast: Secondary | ICD-10-CM

## 2017-07-25 NOTE — Progress Notes (Signed)
Subjective:   Kristie Walters is a 72 y.o. female who presents for Medicare Annual (Subsequent) preventive examination.  Review of Systems:  N/A Cardiac Risk Factors include: advanced age (>67men, >41 women);dyslipidemia;smoking/ tobacco exposure;sedentary lifestyle;family history of premature cardiovascular disease     Objective:     Vitals: BP 122/62 (BP Location: Left Arm, Patient Position: Sitting, Cuff Size: Normal)   Pulse 78   Temp 98.4 F (36.9 C) (Oral)   Resp 12   Ht 5\' 11"  (1.803 m)   Wt 158 lb 6.4 oz (71.8 kg)   SpO2 95%   BMI 22.09 kg/m   Body mass index is 22.09 kg/m.  Advanced Directives 07/25/2017 11/27/2016 11/26/2016 11/18/2016 09/04/2016 07/12/2016 02/27/2016  Does Patient Have a Medical Advance Directive? Yes No Yes Yes No Yes No  Type of Paramedic of [place];Living will - Healthcare Power of [place];Living will - [place] -  Does patient want to make changes to medical advance directive? - - - - - - -  Copy of [place] in Chart? Yes - - No - copy requested - - -  Would patient like information on creating a medical advance directive? - No - Patient declined - - - - -    Tobacco Social History   Tobacco Use  Smoking Status Former Smoker  . Packs/day: 1.00  . Years: 55.00  . Pack years: 55.00  . Types: Cigarettes  . Last attempt to quit: 11/26/2016  . Years since quitting: 0.6  Smokeless Tobacco Current User  Tobacco Comment   smoking cessation materials not required     Ready to quit: Yes Counseling given: No Comment: smoking cessation materials not required  Clinical Intake:  Pre-visit preparation completed: Yes  Pain : No/denies pain   BMI - recorded: 22.2 Nutritional Status: BMI of 19-24  Normal Nutritional Risks: None Diabetes: No  How often do you need to have someone help you when you read instructions, pamphlets, or other written materials  from your doctor or pharmacy?: 1 - Never  Interpreter Needed?: No  Information entered by :: AEversole, LPN  Past Medical History:  Diagnosis Date  . Carotid artery disease ([place])    s/p L. CEA in July 2016  . Collagen vascular disease ([place])    Left carotid stenosis  . COPD (chronic obstructive pulmonary disease) ([place])   . Elevated blood pressure (not hypertension)   . GERD (gastroesophageal reflux disease)   . Personal history of tobacco use, presenting hazards to health 08/31/2015  . PONV (postoperative nausea and vomiting)    Past Surgical History:  Procedure Laterality Date  . ENDARTERECTOMY Left 08/25/2014   Procedure: ENDARTERECTOMY CAROTID;  Surgeon: Algernon Huxley, MD;  Location: [facility] ORS;  Service: Vascular;  Laterality: Left;  . MOHS SURGERY    . PERIPHERAL VASCULAR CATHETERIZATION N/A 07/20/2014   Procedure: Carotid Angiography;  Surgeon: Algernon Huxley, MD;  Location: [facility];  Service: Cardiovascular;  Laterality: N/A;  . TONSILLECTOMY    . TUBAL LIGATION     Family History  Problem Relation Age of Onset  . Heart attack Mother   . Hypercholesterolemia Mother   . Hypertension Mother   . Peripheral vascular disease Mother   . Dementia Mother   . Hypothyroidism Mother   . CVA Father   . Liver cancer Father   . Diabetes Brother   . Diabetes Brother   . Alzheimer's disease Brother  Social History   Socioeconomic History  . Marital status: Divorced    Spouse name: Not on file  . Number of children: 2  . Years of education: Not on file  . Highest education level: 10th grade  Occupational History  . Occupation: Retired  Scientific laboratory technician  . Financial resource strain: Not hard at all  . Food insecurity:    Worry: Never true    Inability: Never true  . Transportation needs:    Medical: No    Non-medical: No  Tobacco Use  . Smoking status: Former Smoker    Packs/day: 1.00    Years: 55.00    Pack years: 55.00    Types: Cigarettes    Last attempt to  quit: 11/26/2016    Years since quitting: 0.6  . Smokeless tobacco: Current User  . Tobacco comment: smoking cessation materials not required  Substance and Sexual Activity  . Alcohol use: No  . Drug use: No  . Sexual activity: Yes  Lifestyle  . Physical activity:    Days per week: 0 days    Minutes per session: 0 min  . Stress: Not at all  Relationships  . Social connections:    Talks on phone: Patient refused    Gets together: Patient refused    Attends religious service: Patient refused    Active member of club or organization: Patient refused    Attends meetings of clubs or organizations: Patient refused    Relationship status: Divorced  Other Topics Concern  . Not on file  Social History Narrative  . Not on file    Outpatient Encounter Medications as of 07/25/2017  Medication Sig  . albuterol (PROVENTIL HFA;VENTOLIN HFA) 108 (90 Base) MCG/ACT inhaler Inhale 2 puffs into the lungs every 6 (six) hours as needed for wheezing or shortness of breath.  [name] aspirin 81 MG tablet Take 81 mg by mouth daily.  [name] atorvastatin (LIPITOR) 10 MG tablet TAKE 1 TABLET BY MOUTH EVERY DAY  . Fish Oil-Cholecalciferol (FISH OIL + D3 PO) Take 1,000 mg by mouth 1 day or 1 dose.  [name] omeprazole (PRILOSEC) 40 MG capsule Take 40 mg by mouth daily.  . fluticasone (FLONASE) 50 MCG/ACT nasal spray Place 2 sprays into both nostrils daily. (Patient not taking: Reported on 07/25/2017)   No facility-administered encounter medications on file as of 07/25/2017.     Activities of Daily Living In your present state of health, do you have any difficulty performing the following activities: 07/25/2017 11/27/2016  Hearing? Y N  Comment hearing loss B ears; denies hearing aids -  Vision? N N  Comment reading glasses -  Difficulty concentrating or making decisions? N N  Walking or climbing stairs? Y N  Comment dyspnea, joint and back pain -  Dressing or bathing? N N  Doing errands, shopping? N N  Preparing Food and  eating ? N -  Comment full upper dentures -  Using the Toilet? N -  In the past six months, have you accidently leaked urine? N -  Do you have problems with loss of bowel control? N -  Managing your Medications? N -  Managing your Finances? N -  Housekeeping or managing your Housekeeping? N -  Some recent data might be hidden    Patient Care Team: Lada, Satira Anis, MD as PCP - General (Family Medicine) Lucky Cowboy Erskine Squibb, MD as Consulting Physician (Vascular Surgery)    Assessment:   This is a routine wellness examination for [employer].  Exercise Activities and Dietary recommendations Current Exercise Habits: The patient does not participate in regular exercise at present, Exercise limited by: None identified  Goals    . DIET - INCREASE WATER INTAKE     Recommend to drink at least 6-8 8oz glasses of water per day.    . Exercise 3x per week (30 min per time)     Recommend walking 3 times per week for at least 30 minutes       Fall Risk Fall Risk  07/25/2017 11/18/2016 02/27/2016 08/15/2015 07/12/2015  Falls in the past year? Yes No No No No  Comment fell carrying laundry down the stairs - - - -  Number falls in past yr: 1 - - - -  Injury with Fall? No - - - -  Risk for fall due to : Impaired vision Impaired balance/gait;Impaired vision - - -  Risk for fall due to: Comment wears eyeglasses "staggers" when she walks; wears eyeglasses - - -  Follow up Falls evaluation completed;Education provided;Falls prevention discussed - - - -   FALL RISK PREVENTION PERTAINING TO HOME: Is your home free of loose throw rugs in walkways, pet beds, electrical cords, etc? Yes Is there adequate lighting in your home to reduce risk of falls?  Yes Are there stairs in or around your home WITH handrails? Yes  ASSISTIVE DEVICES UTILIZED TO PREVENT FALLS: Use of a cane, walker or w/c? No Grab bars in the bathroom? No  Shower chair or a place to sit while bathing? No An elevated toilet seat or a handicapped  toilet? Yes  Timed Get Up and Go Performed: Yes. Pt ambulated 10 feet within 20 sec. Gait slow, steady and without the use of an assistive device. No intervention required at this time. Fall risk prevention has been discussed.  Community Resource Referral:  Data processing manager Referral sent to Care Guide for installation of grab bars in the shower and an elevated toilet seat.  Depression Screen PHQ 2/9 Scores 07/25/2017 11/18/2016 02/27/2016 08/15/2015  PHQ - 2 Score 0 2 0 0  PHQ- 9 Score 0 7 - -     Cognitive Function     6CIT Screen 07/25/2017 11/18/2016  What Year? 0 points 0 points  What month? 0 points 0 points  What time? 0 points 0 points  Count back from 20 0 points 0 points  Months in reverse 0 points 0 points  Repeat phrase 0 points 0 points  Total Score 0 0    Immunization History  Administered Date(s) Administered  . Influenza, High Dose Seasonal PF 10/08/2016  . Influenza-Unspecified 11/19/2014, 11/16/2015, 10/08/2016  . Pneumococcal Conjugate-13 11/18/2016  . Pneumococcal Polysaccharide-23 06/11/2012  . Td 02/18/2009    Qualifies for Shingles Vaccine? Yes. Due for Shingrix. Education has been provided regarding the importance of this vaccine. Pt has been advised to call her insurance company to determine her out of pocket expense. Advised she may also receive this vaccine at her local pharmacy or Health Dept. Verbalized acceptance and understanding.  Screening Tests Health Maintenance  Topic Date Due  . MAMMOGRAM  09/28/2016  . INFLUENZA VACCINE  09/18/2017  . TETANUS/TDAP  02/19/2019  . COLONOSCOPY  06/24/2022  . DEXA SCAN  Completed  . Hepatitis C Screening  Completed  . PNA vac Low Risk Adult  Completed    Cancer Screenings: Lung: Low Dose CT Chest recommended if Age 52-80 years, 30 pack-year currently smoking OR have quit w/in 15years. Patient does qualify. Pt is  followed by the cancer center. LDCT to be scheduled 08/2017. Pt is aware. Breast:  Up to date on  Mammogram? No. Completed 09/29/15. Repeat every year. Ordered today. Pt provided with contact information and advised to schedule appt.   Up to date of Bone Density/Dexa? Yes. Completed 06/11/12. Osteoporotic screenings no longer required Colorectal: Completed 06/23/12. Repeat every 10 years.  Additional Screenings: Hepatitis C Screening: Completed 11/18/16    Plan:  I have personally reviewed and addressed the Medicare Annual Wellness questionnaire and have noted the following in the patient's chart:  A. Medical and social history B. Use of alcohol, tobacco or illicit drugs  C. Current medications and supplements D. Functional ability and status E.  Nutritional status F.  Physical activity G. Advance directives H. List of other physicians I.  Hospitalizations, surgeries, and ER visits in previous 12 months J.  [place] such as hearing and vision if needed, cognitive and depression L. Referrals and appointments  In addition, I have reviewed and discussed with patient certain preventive protocols, quality metrics, and best practice recommendations. A written personalized care plan for preventive services as well as general preventive health recommendations were provided to patient.  See attached scanned questionnaire for additional information.   Signed,  Aleatha Borer, LPN Nurse Health Advisor

## 2017-07-25 NOTE — Patient Instructions (Signed)
Kristie Walters , Thank you for taking time to come for your Medicare Wellness Visit. I appreciate your ongoing commitment to your health goals. Please review the following plan we discussed and let me know if I can assist you in the future.   Screening recommendations/referrals: Colorectal Screening: Up to date Mammogram: Please schedule your appointment Bone Density: No longer required Lung Cancer Screening: You will receive a call from the cancer center to schedule your CT Hepatitis C Screening: Up to date  Vision and Dental Exams: Recommended annual ophthalmology exams for early detection of glaucoma and other disorders of the eye Recommended annual dental exams for proper oral hygiene  Vaccinations: Influenza vaccine: Up to date Pneumococcal vaccine: Up to date Tdap vaccine: Up to date Shingles vaccine: Please call your insurance company to determine your out of pocket expense for the Shingrix vaccine. You may also receive this vaccine at your local pharmacy or Health Dept.  Advanced directives: We have received a copy of your POA (Power of [place]) and/or Living Will. These documents can be located in your chart.  Conditions/risks identified: Recommend to drink at least 6-8 8oz glasses of water per day.  Next appointment: Please schedule your Annual Wellness Visit with your Nurse Health Advisor in one year.  Preventive Care 72 Years and Older, Female Preventive care refers to lifestyle choices and visits with your health care provider that can promote health and wellness. What does preventive care include?  A yearly physical exam. This is also called an annual well check.  Dental exams once or twice a year.  Routine eye exams. Ask your health care provider how often you should have your eyes checked.  Personal lifestyle choices, including:  Daily care of your teeth and gums.  Regular physical activity.  Eating a healthy diet.  Avoiding tobacco and drug use.  Limiting  alcohol use.  Practicing safe sex.  Taking low-dose aspirin every day.  Taking vitamin and mineral supplements as recommended by your health care provider. What happens during an annual well check? The services and screenings done by your health care provider during your annual well check will depend on your age, overall health, lifestyle risk factors, and family history of disease. Counseling  Your health care provider may ask you questions about your:  Alcohol use.  Tobacco use.  Drug use.  Emotional well-being.  Home and relationship well-being.  Sexual activity.  Eating habits.  History of falls.  Memory and ability to understand (cognition).  Work and work Statistician.  Reproductive health. Screening  You may have the following tests or measurements:  Height, weight, and BMI.  Blood pressure.  Lipid and cholesterol levels. These may be checked every 5 years, or more frequently if you are over 43 years old.  Skin check.  Lung cancer screening. You may have this screening every year starting at age 13 if you have a 30-pack-year history of smoking and currently smoke or have quit within the past 15 years.  Fecal occult blood test (FOBT) of the stool. You may have this test every year starting at age 72.  Flexible sigmoidoscopy or colonoscopy. You may have a sigmoidoscopy every 5 years or a colonoscopy every 10 years starting at age 72.  Hepatitis C blood test.  Hepatitis B blood test.  Sexually transmitted disease (STD) testing.  Diabetes screening. This is done by checking your blood sugar (glucose) after you have not eaten for a while (fasting). You may have this done every 1-3 years.  Bone density scan. This is done to screen for osteoporosis. You may have this done starting at age 72.  Mammogram. This may be done every 1-2 years. Talk to your health care provider about how often you should have regular mammograms. Talk with your health care provider  about your test results, treatment options, and if necessary, the need for more tests. Vaccines  Your health care provider may recommend certain vaccines, such as:  Influenza vaccine. This is recommended every year.  Tetanus, diphtheria, and acellular pertussis (Tdap, Td) vaccine. You may need a Td booster every 10 years.  Zoster vaccine. You may need this after age 72.  Pneumococcal 13-valent conjugate (PCV13) vaccine. One dose is recommended after age 72.  Pneumococcal polysaccharide (PPSV23) vaccine. One dose is recommended after age 72. Talk to your health care provider about which screenings and vaccines you need and how often you need them. This information is not intended to replace advice given to you by your health care provider. Make sure you discuss any questions you have with your health care provider. Document Released: 03/03/2015 Document Revised: 10/25/2015 Document Reviewed: 12/06/2014 Elsevier Interactive Patient Education  2017 [place] Prevention in the Home Falls can cause injuries. They can happen to people of all ages. There are many things you can do to make your home safe and to help prevent falls. What can I do on the outside of my home?  Regularly fix the edges of walkways and driveways and fix any cracks.  Remove anything that might make you trip as you walk through a door, such as a raised step or threshold.  Trim any bushes or trees on the path to your home.  Use bright outdoor lighting.  Clear any walking paths of anything that might make someone trip, such as rocks or tools.  Regularly check to see if handrails are loose or broken. Make sure that both sides of any steps have handrails.  Any raised decks and porches should have guardrails on the edges.  Have any leaves, snow, or ice cleared regularly.  Use sand or salt on walking paths during winter.  Clean up any spills in your garage right away. This includes oil or grease  spills. What can I do in the bathroom?  Use night lights.  Install grab bars by the toilet and in the tub and shower. Do not use towel bars as grab bars.  Use non-skid mats or decals in the tub or shower.  If you need to sit down in the shower, use a plastic, non-slip stool.  Keep the floor dry. Clean up any water that spills on the floor as soon as it happens.  Remove soap buildup in the tub or shower regularly.  Attach bath mats securely with double-sided non-slip rug tape.  Do not have throw rugs and other things on the floor that can make you trip. What can I do in the bedroom?  Use night lights.  Make sure that you have a light by your bed that is easy to reach.  Do not use any sheets or blankets that are too big for your bed. They should not hang down onto the floor.  Have a firm chair that has side arms. You can use this for support while you get dressed.  Do not have throw rugs and other things on the floor that can make you trip. What can I do in the kitchen?  Clean up any spills right away.  Avoid walking  on wet floors.  Keep items that you use a lot in easy-to-reach places.  If you need to reach something above you, use a strong step stool that has a grab bar.  Keep electrical cords out of the way.  Do not use floor polish or wax that makes floors slippery. If you must use wax, use non-skid floor wax.  Do not have throw rugs and other things on the floor that can make you trip. What can I do with my stairs?  Do not leave any items on the stairs.  Make sure that there are handrails on both sides of the stairs and use them. Fix handrails that are broken or loose. Make sure that handrails are as long as the stairways.  Check any carpeting to make sure that it is firmly attached to the stairs. Fix any carpet that is loose or worn.  Avoid having throw rugs at the top or bottom of the stairs. If you do have throw rugs, attach them to the floor with carpet  tape.  Make sure that you have a light switch at the top of the stairs and the bottom of the stairs. If you do not have them, ask someone to add them for you. What else can I do to help prevent falls?  Wear shoes that:  Do not have high heels.  Have rubber bottoms.  Are comfortable and fit you well.  Are closed at the toe. Do not wear sandals.  If you use a stepladder:  Make sure that it is fully opened. Do not climb a closed stepladder.  Make sure that both sides of the stepladder are locked into place.  Ask someone to hold it for you, if possible.  Clearly mark and make sure that you can see:  Any grab bars or handrails.  First and last steps.  Where the edge of each step is.  Use tools that help you move around (mobility aids) if they are needed. These include:  Canes.  Walkers.  Scooters.  Crutches.  Turn on the lights when you go into a dark area. Replace any light bulbs as soon as they burn out.  Set up your furniture so you have a clear path. Avoid moving your furniture around.  If any of your floors are uneven, fix them.  If there are any pets around you, be aware of where they are.  Review your medicines with your doctor. Some medicines can make you feel dizzy. This can increase your chance of falling. Ask your doctor what other things that you can do to help prevent falls. This information is not intended to replace advice given to you by your health care provider. Make sure you discuss any questions you have with your health care provider. Document Released: 12/01/2008 Document Revised: 07/13/2015 Document Reviewed: 03/11/2014 Elsevier Interactive Patient Education  2017 [employer].

## 2017-08-01 ENCOUNTER — Ambulatory Visit (INDEPENDENT_AMBULATORY_CARE_PROVIDER_SITE_OTHER): Payer: Medicare Other | Admitting: Family Medicine

## 2017-08-01 ENCOUNTER — Other Ambulatory Visit: Payer: Self-pay | Admitting: Family Medicine

## 2017-08-01 ENCOUNTER — Encounter: Payer: Self-pay | Admitting: Family Medicine

## 2017-08-01 ENCOUNTER — Other Ambulatory Visit: Payer: Self-pay

## 2017-08-01 VITALS — BP 120/64 | HR 91 | Temp 98.3°F | Resp 14 | Ht 72.0 in | Wt 156.3 lb

## 2017-08-01 DIAGNOSIS — K219 Gastro-esophageal reflux disease without esophagitis: Secondary | ICD-10-CM

## 2017-08-01 DIAGNOSIS — Z5181 Encounter for therapeutic drug level monitoring: Secondary | ICD-10-CM | POA: Diagnosis not present

## 2017-08-01 DIAGNOSIS — G629 Polyneuropathy, unspecified: Secondary | ICD-10-CM | POA: Diagnosis not present

## 2017-08-01 DIAGNOSIS — D485 Neoplasm of uncertain behavior of skin: Secondary | ICD-10-CM | POA: Diagnosis not present

## 2017-08-01 DIAGNOSIS — R748 Abnormal levels of other serum enzymes: Secondary | ICD-10-CM

## 2017-08-01 DIAGNOSIS — I6523 Occlusion and stenosis of bilateral carotid arteries: Secondary | ICD-10-CM

## 2017-08-01 DIAGNOSIS — R739 Hyperglycemia, unspecified: Secondary | ICD-10-CM | POA: Diagnosis not present

## 2017-08-01 DIAGNOSIS — E2839 Other primary ovarian failure: Secondary | ICD-10-CM

## 2017-08-01 DIAGNOSIS — R202 Paresthesia of skin: Secondary | ICD-10-CM

## 2017-08-01 DIAGNOSIS — I700 Atherosclerosis of aorta: Secondary | ICD-10-CM | POA: Diagnosis not present

## 2017-08-01 DIAGNOSIS — E785 Hyperlipidemia, unspecified: Secondary | ICD-10-CM | POA: Diagnosis not present

## 2017-08-01 NOTE — Assessment & Plan Note (Signed)
Order DEXA; try to get calcium in the diet; practice good fall precautions

## 2017-08-01 NOTE — Progress Notes (Signed)
Alk phos isoenzymes ordered

## 2017-08-01 NOTE — Assessment & Plan Note (Signed)
Check lipids; continue statin; goal LDL < 70; seeing vascular doctor once a year

## 2017-08-01 NOTE — Assessment & Plan Note (Signed)
Check isoenzymes 

## 2017-08-01 NOTE — Assessment & Plan Note (Signed)
Check lipid panel; continue statin

## 2017-08-01 NOTE — Assessment & Plan Note (Signed)
Check lipids; limit saturated fats 

## 2017-08-01 NOTE — Patient Instructions (Addendum)
Caution: prolonged use of proton pump inhibitors like omeprazole (Prilosec), pantoprazole (Protonix), esomeprazole (Nexium), and others like Dexilant and Aciphex may increase your risk of pneumonia, Clostridium difficile colitis, osteoporosis, anemia and other health complications Try to limit or avoid triggers like coffee, caffeinated beverages, onions, chocolate, spicy foods, peppermint, acidic foods like pizza, spaghetti sauce, and orange juice Lose weight if you are overweight or obese Try elevating the head of your bed by placing a small wedge between your mattress and box springs to keep acid in the stomach at night instead of coming up into your esophagus  We'll get labs today We'll have you see the dermatologist If you have not heard anything from my staff in a week about any orders/referrals/studies from today, please contact us here to follow-up [phone]  Please do call to schedule your bone density study; the number to schedule one at either [facility] or [facility] is [phone] or [phone]  Try to limit saturated fats in your diet (bologna, hot dogs, barbeque, cheeseburgers, hamburgers, steak, bacon, sausage, cheese, etc.) and get more fresh fruits, vegetables, and whole grains

## 2017-08-01 NOTE — Assessment & Plan Note (Signed)
Check glucose and A1c 

## 2017-08-01 NOTE — Assessment & Plan Note (Signed)
Check labs 

## 2017-08-01 NOTE — Progress Notes (Signed)
BP 120/64   Pulse 91   Temp 98.3 F (36.8 C) (Oral)   Resp 14   Ht 6' (1.829 m)   Wt 156 lb 4.8 oz (70.9 kg)   SpO2 95%   BMI 21.20 kg/m    Subjective:    Patient ID: Kristie Walters, female    DOB: 1945/10/26, 72 y.o.   MRN: [id number]  HPI: Kristie Walters is a 72 y.o. female  Chief Complaint  Patient presents with  . Follow-up    with labs  . Medication Refill  . Leg Pain    bilateral legs and feet with stinging and burning    HPI  Patient is new to me; previous provider left our practice  She has carotid atherosclerosis; had another US done by her vascular doctor and "everything turned out fine" she says Eating is not as healthy as it should be Last lipid panel was more than a year ago  She was in the hospital last year with a bowel blockage; changed her eating and quit smoking  She had a nurse call visit to just go over stuff; normal routine things Had AWV with Ammie  She has a problem with her feet and legs; she was told that she has neuropathy; gabapentin did not help Her feet burn and hurt; stabbing pain in the legs and feet; they said it might be coming from her back since she doesn't have diabetes and has a herniated disc  Taking omeprazole for acid reflux; she has a lot of problems from her stomach after the bowel blockage; not really okay; she does not see a GI specialist; I offered referral and respectfully declined; she will notice after eating soft drinks, now drinking caffeine-free pepsi; spicy foods and coffee or eating too much or too late; no bloo din the stool  COPD; she does not use rescue inhaler very much; not even once a week; maybe once a month; since she quit smoking, getting better  Fam hx of diabetes; both brothers; one lost legs, but also agent orange; sister is diabetic too; hyperglycemia in the chart  Quit smoking; getting chest CT every year She is due a mammogram; she will get that set up    [facility] Visit from [facility]/01/2017 in [facility]  AUDIT-C Score  0     Depression screen [facility] 2/9 08/01/2017 07/25/2017 11/18/2016 02/27/2016 08/15/2015  Decreased Interest 0 0 1 0 0  Down, Depressed, Hopeless 1 0 1 0 0  PHQ - 2 Score 1 0 2 0 0  Altered sleeping - 0 3 - -  Tired, decreased energy - 0 1 - -  Change in appetite - 0 1 - -  Feeling bad or failure about yourself  - 0 0 - -  Trouble concentrating - 0 0 - -  Moving slowly or fidgety/restless - 0 0 - -  Suicidal thoughts - 0 0 - -  PHQ-9 Score - 0 7 - -  Difficult doing work/chores - Not difficult at all Not difficult at all - -    Relevant past medical, surgical, family and social history reviewed Past Medical History:  Diagnosis Date  . Carotid artery disease ([place])    s/p L. CEA in July 2016  . Collagen vascular disease ([place])    Left carotid stenosis  . COPD (chronic obstructive pulmonary disease) ([place])   . Elevated blood pressure (not hypertension)   . GERD (gastroesophageal reflux disease)   . Personal history  of tobacco use, presenting hazards to health 08/31/2015  . PONV (postoperative nausea and vomiting)    Past Surgical History:  Procedure Laterality Date  . ENDARTERECTOMY Left 08/25/2014   Procedure: ENDARTERECTOMY CAROTID;  Surgeon: Algernon Huxley, MD;  Location: [facility] ORS;  Service: Vascular;  Laterality: Left;  . MOHS SURGERY    . PERIPHERAL VASCULAR CATHETERIZATION N/A 07/20/2014   Procedure: Carotid Angiography;  Surgeon: Algernon Huxley, MD;  Location: [place] [facility];  Service: Cardiovascular;  Laterality: N/A;  . TONSILLECTOMY    . TUBAL LIGATION     Family History  Problem Relation Age of Onset  . Heart attack Mother   . Hypercholesterolemia Mother   . Hypertension Mother   . Peripheral vascular disease Mother   . Dementia Mother   . Hypothyroidism Mother   . CVA Father   . Liver cancer Father   . Diabetes Brother   . Diabetes Brother   . Alzheimer's disease Brother   brother just died; dementia, nursing home,  declined  Social History   Tobacco Use  . Smoking status: Former Smoker    Packs/day: 1.00    Years: 55.00    Pack years: 55.00    Types: Cigarettes    Last attempt to quit: 11/26/2016    Years since quitting: 0.7  . Smokeless tobacco: Current User  . Tobacco comment: smoking cessation materials not required  Substance Use Topics  . Alcohol use: No  . Drug use: No    Interim medical history since last visit reviewed. Allergies and medications reviewed  Review of Systems Per HPI unless specifically indicated above     Objective:    BP 120/64   Pulse 91   Temp 98.3 F (36.8 C) (Oral)   Resp 14   Ht 6' (1.829 m)   Wt 156 lb 4.8 oz (70.9 kg)   SpO2 95%   BMI 21.20 kg/m   Wt Readings from Last 3 Encounters:  08/01/17 156 lb 4.8 oz (70.9 kg)  07/25/17 158 lb 6.4 oz (71.8 kg)  07/18/17 158 lb 3.2 oz (71.8 kg)    Physical Exam  Constitutional: She appears well-developed and well-nourished. No distress.  HENT:  Head: Normocephalic and atraumatic.  Eyes: EOM are normal. No scleral icterus.  Neck: No thyromegaly present.  Cardiovascular: Normal rate, regular rhythm and normal heart sounds.  No murmur heard. Pulmonary/Chest: Effort normal and breath sounds normal. No respiratory distress. She has no wheezes.  Abdominal: Soft. Bowel sounds are normal. She exhibits no distension.  Musculoskeletal: She exhibits no edema.  Neurological: She is alert. She exhibits normal muscle tone.  Skin: Skin is warm and dry. She is not diaphoretic. No pallor.  Psychiatric: She has a normal mood and affect. Her behavior is normal. Judgment and thought content normal.        Assessment & Plan:   Problem List Items Addressed This Visit      Cardiovascular and Mediastinum   Carotid stenosis    Check lipids; continue statin; goal LDL < 70; seeing vascular doctor once a year      Aortic atherosclerosis ([place]) - Primary    Check lipid panel; continue statin        Digestive    GERD (gastroesophageal reflux disease)    Avoid triggers; no red flags; continue PPI; see AVS        Other   Paresthesia of foot, bilateral    Check labs      Relevant Orders  Magnesium (Completed)   Vitamin B12 (Completed)   Protein,Total and Elect and IFE,24 (Completed)   Protein Electrophoresis, (serum) (Completed)   TSH (Completed)   Hyperlipidemia    Check lipids; limit saturated fats      Relevant Orders   Lipid panel (Completed)   Hyperglycemia    Check glucose and A1c      Relevant Orders   Hemoglobin A1c (Completed)   Estrogen deficiency    Order DEXA; try to get calcium in the diet; practice good fall precautions      Relevant Orders   DG Bone Density    Other Visit Diagnoses    Neuropathy       Relevant Orders   Magnesium (Completed)   Vitamin B12 (Completed)   Protein,Total and Elect and IFE,24 (Completed)   Protein Electrophoresis, (serum) (Completed)   TSH (Completed)   Neoplasm of uncertain behavior of skin       Relevant Orders   Ambulatory referral to Dermatology   Medication monitoring encounter       Relevant Orders   CBC with Differential/Platelet (Completed)   COMPLETE METABOLIC PANEL WITH GFR (Completed)       Follow up plan: Return in about 6 months (around 01/31/2018).  An after-visit summary was printed and given to the patient at [place].  Please see the patient instructions which may contain other information and recommendations beyond what is mentioned above in the assessment and plan.  No orders of the defined types were placed in this encounter.   Orders Placed This Encounter  Procedures  . DG Bone Density  . CBC with Differential/Platelet  . COMPLETE METABOLIC PANEL WITH GFR  . Hemoglobin A1c  . Lipid panel  . Magnesium  . Vitamin B12  . Protein,Total and Elect and IFE,24  . Protein Electrophoresis, (serum)  . TSH  . Alkaline Phosphatase Isoenzymes  . Ambulatory referral to Dermatology

## 2017-08-01 NOTE — Assessment & Plan Note (Signed)
Avoid triggers; no red flags; continue PPI; see AVS

## 2017-08-04 ENCOUNTER — Other Ambulatory Visit: Payer: Self-pay | Admitting: Family Medicine

## 2017-08-04 DIAGNOSIS — J449 Chronic obstructive pulmonary disease, unspecified: Secondary | ICD-10-CM

## 2017-08-04 LAB — LIPID PANEL
Cholesterol: 163 mg/dL (ref <200)
HDL: 33 mg/dL — ABNORMAL LOW (ref >50)
LDL Cholesterol (Calc): 100 mg/dL (calc) — ABNORMAL HIGH
Non-HDL Cholesterol (Calc): 130 mg/dL (calc) — ABNORMAL HIGH (ref <130)
Total CHOL/HDL Ratio: 4.9 (calc) (ref <5.0)
Triglycerides: 179 mg/dL — ABNORMAL HIGH (ref <150)

## 2017-08-04 LAB — COMPLETE METABOLIC PANEL WITH GFR
AG Ratio: 1.7 (calc) (ref 1.0–2.5)
ALBUMIN MSPROF: 4.3 g/dL (ref 3.6–5.1)
ALKALINE PHOSPHATASE (APISO): 167 U/L — AB (ref 33–130)
ALT: 27 U/L (ref 6–29)
AST: 22 U/L (ref 10–35)
BILIRUBIN TOTAL: 0.9 mg/dL (ref 0.2–1.2)
BUN: 17 mg/dL (ref 7–25)
CHLORIDE: 104 mmol/L (ref 98–110)
CO2: 27 mmol/L (ref 20–32)
Calcium: 9.7 mg/dL (ref 8.6–10.4)
Creat: 0.87 mg/dL (ref 0.60–0.93)
GFR, Est African American: 77 mL/min/{1.73_m2} (ref >=60)
GFR, Est Non African American: 67 mL/min/{1.73_m2} (ref >=60)
GLUCOSE: 97 mg/dL (ref 65–99)
Globulin: 2.5 g/dL (calc) (ref 1.9–3.7)
POTASSIUM: 3.9 mmol/L (ref 3.5–5.3)
SODIUM: 140 mmol/L (ref 135–146)
Total Protein: 6.8 g/dL (ref 6.1–8.1)

## 2017-08-04 LAB — CBC WITH DIFFERENTIAL/PLATELET
BASOS ABS: 67 {cells}/uL (ref 0–200)
Basophils Relative: 0.9 %
EOS ABS: 148 {cells}/uL (ref 15–500)
Eosinophils Relative: 2 %
HCT: 40.1 % (ref 35.0–45.0)
HEMOGLOBIN: 14.1 g/dL (ref 11.7–15.5)
Lymphs Abs: 2102 cells/uL (ref 850–3900)
MCH: 29.3 pg (ref 27.0–33.0)
MCHC: 35.2 g/dL (ref 32.0–36.0)
MCV: 83.2 fL (ref 80.0–100.0)
MONOS PCT: 7.8 %
MPV: 10.6 fL (ref 7.5–12.5)
NEUTROS ABS: 4507 {cells}/uL (ref 1500–7800)
Neutrophils Relative %: 60.9 %
Platelets: 252 10*3/uL (ref 140–400)
RBC: 4.82 10*6/uL (ref 3.80–5.10)
RDW: 12.8 % (ref 11.0–15.0)
TOTAL LYMPHOCYTE: 28.4 %
WBC mixed population: 577 cells/uL (ref 200–950)
WBC: 7.4 10*3/uL (ref 3.8–10.8)

## 2017-08-04 LAB — PROTEIN ELECTROPHORESIS, SERUM
ALPHA 2: 0.8 g/dL (ref 0.5–0.9)
Albumin ELP: 4.3 g/dL (ref 3.8–4.8)
Alpha 1: 0.3 g/dL (ref 0.2–0.3)
BETA GLOBULIN: 0.5 g/dL (ref 0.4–0.6)
Beta 2: 0.4 g/dL (ref 0.2–0.5)
GAMMA GLOBULIN: 0.9 g/dL (ref 0.8–1.7)
TOTAL PROTEIN: 7.2 g/dL (ref 6.1–8.1)

## 2017-08-04 LAB — MAGNESIUM: Magnesium: 2 mg/dL (ref 1.5–2.5)

## 2017-08-04 LAB — TSH: TSH: 3.21 m[IU]/L (ref 0.40–4.50)

## 2017-08-04 LAB — HEMOGLOBIN A1C
Hgb A1c MFr Bld: 5.6 % of total Hgb (ref <5.7)
MEAN PLASMA GLUCOSE: 114 (calc)
eAG (mmol/L): 6.3 (calc)

## 2017-08-04 LAB — VITAMIN B12: Vitamin B-12: 297 pg/mL (ref 200–1100)

## 2017-08-04 NOTE — Telephone Encounter (Signed)
Her vascular doctor is prescribing her statin (atorvastatin); please fax most recent labs to him so he can adjust her atorvastatin if desired I've refilled the inhaler

## 2017-08-05 NOTE — Telephone Encounter (Signed)
faxed

## 2017-08-06 DIAGNOSIS — Z5181 Encounter for therapeutic drug level monitoring: Secondary | ICD-10-CM | POA: Diagnosis not present

## 2017-08-06 DIAGNOSIS — R739 Hyperglycemia, unspecified: Secondary | ICD-10-CM | POA: Diagnosis not present

## 2017-08-06 DIAGNOSIS — E785 Hyperlipidemia, unspecified: Secondary | ICD-10-CM | POA: Diagnosis not present

## 2017-08-06 DIAGNOSIS — R202 Paresthesia of skin: Secondary | ICD-10-CM | POA: Diagnosis not present

## 2017-08-06 DIAGNOSIS — G629 Polyneuropathy, unspecified: Secondary | ICD-10-CM | POA: Diagnosis not present

## 2017-08-08 ENCOUNTER — Other Ambulatory Visit: Payer: Self-pay | Admitting: Family Medicine

## 2017-08-08 DIAGNOSIS — R748 Abnormal levels of other serum enzymes: Secondary | ICD-10-CM

## 2017-08-08 LAB — PROTEIN,TOTAL AND ELECT AND IFE,24
Creatinine, 24H Ur: 1.02 g/(24.h) (ref 0.50–2.15)
PROTEIN/CREATININE RATIO: 69 mg/g{creat} (ref <=114)
Protein, 24H Urine: 70 mg/24 h (ref 0–149)

## 2017-08-08 NOTE — Progress Notes (Signed)
Order UPEP with IFE

## 2017-08-11 ENCOUNTER — Other Ambulatory Visit: Payer: Self-pay | Admitting: Family Medicine

## 2017-08-11 DIAGNOSIS — R748 Abnormal levels of other serum enzymes: Secondary | ICD-10-CM

## 2017-08-11 DIAGNOSIS — R945 Abnormal results of liver function studies: Secondary | ICD-10-CM

## 2017-08-11 LAB — ALKALINE PHOSPHATASE ISOENZYMES
ALKALINE PHOSPHATASE (APISO): 171 U/L — AB (ref 33–130)
Bone Isoenzymes: 62 % (ref 28–66)
INTESTINAL ISOENZYMES (ALP ISO): 4 % (ref 1–24)
Liver Isoenzymes: 34 % (ref 25–69)

## 2017-08-11 NOTE — Progress Notes (Signed)
Ordered triple phase bone scan; patient to schedule

## 2017-08-12 DIAGNOSIS — C44311 Basal cell carcinoma of skin of nose: Secondary | ICD-10-CM | POA: Diagnosis not present

## 2017-08-12 DIAGNOSIS — L821 Other seborrheic keratosis: Secondary | ICD-10-CM | POA: Diagnosis not present

## 2017-08-12 HISTORY — DX: Basal cell carcinoma of skin of nose: C44.311

## 2017-08-18 ENCOUNTER — Telehealth: Payer: Self-pay | Admitting: Family Medicine

## 2017-08-18 NOTE — Telephone Encounter (Signed)
An authorization was performed online but it is in medical review. They might have to reschedule this patient.

## 2017-08-18 NOTE — Telephone Encounter (Unsigned)
Copied from [place] [id number]. Topic: Quick Communication - See Telephone Encounter >> Aug 18, 2017 11:45 AM Percell Belt A wrote: CRM for notification. See Telephone encounter for: 08/18/17.    Kendra from [facility] called and stated that pt has a DEXA scan there tomorrow and will need a in authorization done by the pcp before they can do it.     [phone]

## 2017-08-18 NOTE — Telephone Encounter (Signed)
Left voice mail on [phone] @ 2:34 informing Kristie Walters message below.

## 2017-08-20 ENCOUNTER — Telehealth: Payer: Self-pay

## 2017-08-20 NOTE — Telephone Encounter (Signed)
Copied from [place] [id number]. Topic: General - Other >> Aug 20, 2017  2:10 PM Judyann Munson wrote: Reason for CRM:  Rod Holler calling from [place] [facility]  Requesting a call back at [phone]  from [facility] case number #[id number]. Please advise   Spoke with Dorene Ar and she asked if the last office note along with the most recent labs be faxed to [phone].  The requested information has been printed and faxed as requested.

## 2017-08-27 ENCOUNTER — Encounter
Admission: RE | Admit: 2017-08-27 | Discharge: 2017-08-27 | Disposition: A | Discharge status: Home / Self Care | Payer: Medicare Other | Source: Ambulatory Visit | Attending: Family Medicine | Admitting: Family Medicine

## 2017-08-27 DIAGNOSIS — R748 Abnormal levels of other serum enzymes: Secondary | ICD-10-CM | POA: Insufficient documentation

## 2017-08-27 DIAGNOSIS — R945 Abnormal results of liver function studies: Secondary | ICD-10-CM | POA: Insufficient documentation

## 2017-08-27 DIAGNOSIS — M79661 Pain in right lower leg: Secondary | ICD-10-CM | POA: Diagnosis not present

## 2017-08-27 MED ORDER — TECHNETIUM TC 99M MEDRONATE IV KIT
23.2600 | PACK | Freq: Once | INTRAVENOUS | Status: AC | PRN
  Administered 2017-08-27: 23.26 via INTRAVENOUS

## 2017-09-02 ENCOUNTER — Telehealth: Payer: Self-pay | Admitting: Nurse Practitioner

## 2017-09-03 ENCOUNTER — Telehealth: Payer: Self-pay | Admitting: *Deleted

## 2017-09-03 DIAGNOSIS — Z122 Encounter for screening for malignant neoplasm of respiratory organs: Secondary | ICD-10-CM

## 2017-09-03 DIAGNOSIS — Z87891 Personal history of nicotine dependence: Secondary | ICD-10-CM

## 2017-09-03 NOTE — Telephone Encounter (Signed)
Notified patient that annual lung cancer screening low dose CT scan is due currently or will be in near future. Confirmed that patient is within the age range of 55-77, and asymptomatic, (no signs or symptoms of lung cancer). Patient denies illness that would prevent curative treatment for lung cancer if found. Verified smoking history, (former, quit in 2019. 56 pack year). The shared decision making visit was done 09/01/15. Patient is agreeable for CT scan being scheduled.

## 2017-09-10 ENCOUNTER — Other Ambulatory Visit: Payer: Self-pay

## 2017-09-10 ENCOUNTER — Ambulatory Visit
Admission: RE | Admit: 2017-09-10 | Discharge: 2017-09-10 | Disposition: A | Discharge status: Home / Self Care | Payer: Medicare Other | Source: Ambulatory Visit | Attending: Family Medicine | Admitting: Family Medicine

## 2017-09-10 ENCOUNTER — Encounter: Payer: Self-pay | Admitting: Family Medicine

## 2017-09-10 DIAGNOSIS — M810 Age-related osteoporosis without current pathological fracture: Secondary | ICD-10-CM | POA: Diagnosis not present

## 2017-09-10 DIAGNOSIS — Z780 Asymptomatic menopausal state: Secondary | ICD-10-CM | POA: Diagnosis not present

## 2017-09-10 DIAGNOSIS — Z1231 Encounter for screening mammogram for malignant neoplasm of breast: Secondary | ICD-10-CM | POA: Insufficient documentation

## 2017-09-10 DIAGNOSIS — E2839 Other primary ovarian failure: Secondary | ICD-10-CM

## 2017-09-10 DIAGNOSIS — Z1239 Encounter for other screening for malignant neoplasm of breast: Secondary | ICD-10-CM

## 2017-09-11 ENCOUNTER — Ambulatory Visit
Admission: RE | Admit: 2017-09-11 | Discharge: 2017-09-11 | Disposition: A | Discharge status: Home / Self Care | Payer: Medicare Other | Source: Ambulatory Visit | Attending: Oncology | Admitting: Oncology

## 2017-09-11 DIAGNOSIS — Z122 Encounter for screening for malignant neoplasm of respiratory organs: Secondary | ICD-10-CM | POA: Diagnosis not present

## 2017-09-11 DIAGNOSIS — I700 Atherosclerosis of aorta: Secondary | ICD-10-CM | POA: Diagnosis not present

## 2017-09-11 DIAGNOSIS — K760 Fatty (change of) liver, not elsewhere classified: Secondary | ICD-10-CM | POA: Insufficient documentation

## 2017-09-11 DIAGNOSIS — J432 Centrilobular emphysema: Secondary | ICD-10-CM | POA: Insufficient documentation

## 2017-09-11 DIAGNOSIS — Z87891 Personal history of nicotine dependence: Secondary | ICD-10-CM | POA: Diagnosis not present

## 2017-09-11 DIAGNOSIS — N6001 Solitary cyst of right breast: Secondary | ICD-10-CM | POA: Diagnosis not present

## 2017-09-11 DIAGNOSIS — I2510 Atherosclerotic heart disease of native coronary artery without angina pectoris: Secondary | ICD-10-CM | POA: Insufficient documentation

## 2017-09-15 ENCOUNTER — Encounter: Payer: Self-pay | Admitting: *Deleted

## 2017-09-23 DIAGNOSIS — L578 Other skin changes due to chronic exposure to nonionizing radiation: Secondary | ICD-10-CM | POA: Diagnosis not present

## 2017-09-23 DIAGNOSIS — L814 Other melanin hyperpigmentation: Secondary | ICD-10-CM | POA: Diagnosis not present

## 2017-09-23 DIAGNOSIS — C44311 Basal cell carcinoma of skin of nose: Secondary | ICD-10-CM | POA: Diagnosis not present

## 2017-09-23 DIAGNOSIS — L719 Rosacea, unspecified: Secondary | ICD-10-CM | POA: Diagnosis not present

## 2017-09-23 DIAGNOSIS — Z85828 Personal history of other malignant neoplasm of skin: Secondary | ICD-10-CM | POA: Diagnosis not present

## 2017-09-23 HISTORY — PX: MOHS SURGERY: SUR867

## 2017-09-25 ENCOUNTER — Telehealth: Payer: Self-pay | Admitting: Family Medicine

## 2017-09-25 NOTE — Telephone Encounter (Signed)
appt please to discuss CT findings Thank you

## 2017-09-25 NOTE — Telephone Encounter (Signed)
Called [phone] @ 9:28 left voice message informing pt to return call to schedule appt per dr lada request.

## 2017-09-29 ENCOUNTER — Encounter: Payer: Self-pay | Admitting: Family Medicine

## 2017-09-29 ENCOUNTER — Ambulatory Visit (INDEPENDENT_AMBULATORY_CARE_PROVIDER_SITE_OTHER): Payer: Medicare Other | Admitting: Family Medicine

## 2017-09-29 VITALS — BP 132/84 | HR 99 | Temp 98.5°F | Resp 12 | Ht 71.0 in | Wt 157.7 lb

## 2017-09-29 DIAGNOSIS — I700 Atherosclerosis of aorta: Secondary | ICD-10-CM | POA: Diagnosis not present

## 2017-09-29 DIAGNOSIS — I6523 Occlusion and stenosis of bilateral carotid arteries: Secondary | ICD-10-CM

## 2017-09-29 DIAGNOSIS — E785 Hyperlipidemia, unspecified: Secondary | ICD-10-CM

## 2017-09-29 DIAGNOSIS — I25119 Atherosclerotic heart disease of native coronary artery with unspecified angina pectoris: Secondary | ICD-10-CM | POA: Diagnosis not present

## 2017-09-29 DIAGNOSIS — J432 Centrilobular emphysema: Secondary | ICD-10-CM

## 2017-09-29 DIAGNOSIS — K760 Fatty (change of) liver, not elsewhere classified: Secondary | ICD-10-CM | POA: Diagnosis not present

## 2017-09-29 DIAGNOSIS — R0789 Other chest pain: Secondary | ICD-10-CM

## 2017-09-29 DIAGNOSIS — I2510 Atherosclerotic heart disease of native coronary artery without angina pectoris: Secondary | ICD-10-CM

## 2017-09-29 HISTORY — DX: Centrilobular emphysema: J43.2

## 2017-09-29 HISTORY — DX: Atherosclerotic heart disease of native coronary artery without angina pectoris: I25.10

## 2017-09-29 HISTORY — DX: Fatty (change of) liver, not elsewhere classified: K76.0

## 2017-09-29 MED ORDER — ATORVASTATIN CALCIUM 10 MG PO TABS: 10.0000 mg | ORAL_TABLET | Freq: Every day | ORAL | 3 refills | Status: DC

## 2017-09-29 NOTE — Assessment & Plan Note (Addendum)
Somewhat limiting to activities; patient does not want to change meds at this time

## 2017-09-29 NOTE — Assessment & Plan Note (Signed)
Limit saturated fats and fried foods in the diet; control cholesterol

## 2017-09-29 NOTE — Assessment & Plan Note (Addendum)
Refer to cardiology; showed model of coronary arteries, explained chest CT scan findings; goal LDL less than 70; continue aspirin; see AVS; call 911 if chest pain

## 2017-09-29 NOTE — Patient Instructions (Addendum)
Try to limit saturated fats in your diet (bologna, hot dogs, barbeque, cheeseburgers, hamburgers, steak, bacon, sausage, cheese, etc.) and get more fresh fruits, vegetables, and whole grains Return for fasting labs around September 23rd or just after [facility] have you see the cardiologist If you develop chest pain, call 911  Coronary Artery Disease, Female Coronary artery disease (CAD) is a condition in which the arteries that lead to the heart (coronary arteries) become narrow or blocked. The narrowing or blockage can lead to decreased blood flow to the heart. Prolonged reduced blood flow can cause a heart attack (myocardial infarction or MI). This condition may also be called coronary heart disease. Because CAD is the leading cause of death in women, it is important to understand what causes this condition and how it is treated. What are the causes? CAD is most often caused by atherosclerosis. This is the buildup of fat and cholesterol (plaque) on the inside of the arteries. Over time, the plaque may narrow or block the artery, reducing blood flow to the heart. Plaque can also become weak and break off within a coronary artery and cause a sudden blockage. Other less common causes of CAD include:  An embolism or blood clot in a coronary artery.  A tearing of the artery (spontaneous coronary artery dissection).  An aneurysm.  Inflammation (vasculitis) in the artery wall.  What increases the risk? The following factors may make you more likely to develop this condition:  Age. Women over age 61 are at a greater risk of CAD.  Family history of CAD.  High blood pressure (hypertension).  Diabetes.  High cholesterol levels.  Tobacco use.  Lack of exercise.  Menopause. ? All postmenopausal women are at greater risk of CAD. ? Women who have experienced menopause between the ages of 17-45 (early menopause) are at a higher risk of CAD. ? Women who have experienced menopause before age 71  (premature menopause) are at a very high risk of CAD.  Excessive alcohol use  A diet high in saturated and trans fats, such as fried food and processed meat.  Other possible risk factors include:  High stress levels.  Depression  Obesity.  Sleep apnea.  What are the signs or symptoms? Many people do not have any symptoms during the early stages of CAD. As the condition progresses, symptoms may include:  Chest pain (angina). The pain can: ? Feel like crushing or squeezing, or a tightness, pressure, fullness, or heaviness in the chest. ? Last more than a few minutes or can stop and recur. The pain tends to get worse with exercise or stress and to fade with rest.  Pain in the arms, neck, jaw, or back.  Unexplained heartburn or indigestion.  Shortness of breath.  Nausea.  Sudden cold sweats.  Sudden light-headedness.  Fluttering or fast heartbeat (palpitations).  Many women have chest discomfort and the other symptoms. However, women often have unusual (atypical) symptoms, such as:  Fatigue.  Vomiting.  Unexplained feelings of nervousness or anxiety.  Unexplained weakness.  Dizziness or fainting.  How is this diagnosed? This condition is diagnosed based on:  Your family and medical history.  A physical exam.  Tests, including: ? A test to check the electrical signals in your heart (electrocardiogram). ? Exercise stress test. This looks for signs of blockage when the heart is stressed with exercise, such as running on a treadmill. ? Pharmacologic stress test. This test looks for signs of blockage when the heart is being stressed with a  medicine. ? Blood tests. ? Coronary angiogram. This is a procedure to look at the coronary arteries to see if there is any blockage. During this test, a dye is injected into your arteries so they appear on an X-ray. ? A test that uses sound waves to take a picture of your heart (echocardiogram). ? Chest X-ray.  How is this  treated? This condition may be treated by:  Healthy lifestyle changes to reduce risk factors.  Medicines such as: ? Antiplatelet medicines and blood-thinning medicines, such as aspirin. These help prevent blood clots. ? Nitroglycerin. ? Blood pressure medicines. ? Cholesterol-lowering medicine.  Coronary angioplasty and stenting. During this procedure, a thin, flexible tube is inserted through a blood vessel and into a blocked artery. A balloon or similar device on the end of the tube is inflated to open up the artery. In some cases, a small, mesh tube (stent) is inserted into the artery to keep it open.  Coronary artery bypass surgery. During this surgery, veins or arteries from other parts of the body are used to create a bypass around the blockage and allow blood to reach your heart.  Follow these instructions at home: Medicines  Take over-the-counter and prescription medicines only as told by your health care provider.  Do not take the following medicines unless your health care provider approves: ? NSAIDs, such as ibuprofen, naproxen, or celecoxib. ? Vitamin supplements that contain vitamin A, vitamin E, or both. ? Hormone replacement therapy that contains estrogen with or without progestin. Lifestyle  Follow an exercise program approved by your health care provider. Aim for 150 minutes of moderate exercise or 75 minutes of vigorous exercise each week.  Maintain a healthy weight or lose weight as approved by your health care provider.  Rest when you are tired.  Learn to manage stress or try to limit your stress. Ask your health care provider for suggestions if you need help.  Get screened for depression and seek treatment, if needed.  Do not use any products that contain nicotine or tobacco, such as cigarettes and e-cigarettes. If you need help quitting, ask your health care provider.  Do not use illegal drugs. Eating and drinking  Follow a heart-healthy diet. A  dietitian can help educate you about healthy food options and changes. In general, eat plenty of fruits and vegetables, lean meats, and whole grains.  Avoid foods high in: ? Sugar. ? Salt (sodium). ? Saturated fats, such as processed or fatty meat. ? Trans fats, such as fried food.  Use healthy cooking methods such as roasting, grilling, broiling, baking, poaching, steaming, or stir-frying.  If you drink alcohol, and your health care provider approves, limit your alcohol intake to no more than 1 drink per day. One drink equals 12 ounces of beer, 5 ounces of wine, or 1 ounces of hard liquor. General instructions  Manage any other health conditions, such as hypertension and diabetes. These conditions affect your heart.  Your health care provider may ask you to monitor your blood pressure. Ideally, your blood pressure should be below 130/80.  Keep all follow-up visits as told by your health care provider. This is important. Get help right away if:  You have pain in your chest, neck, arm, jaw, stomach, or back that: ? Lasts more than a few minutes. ? Is recurring. ? Is not relieved by taking medicine under your tongue (sublingualnitroglycerin).  You have profuse sweating without cause.  You have unexplained: ? Heartburn or indigestion. ? Shortness of breath  or difficulty breathing. ? Fluttering or fast heartbeat (palpitations). ? Nausea or vomiting. ? Fatigue. ? Feelings of nervousness or anxiety. ? Weakness. ? Diarrhea.  You have sudden light-headedness or dizziness.  You faint.  You feel like hurting yourself or think about taking your own life. These symptoms may represent a serious problem that is an emergency. Do not wait to see if the symptoms will go away. Get medical help right away. Call your local emergency services (911 in the [place] Do not drive yourself to the hospital. Summary  Coronary artery disease (CAD) is a process in which the arteries that lead to the  heart (coronary arteries) become narrow or blocked. The narrowing or blockage can lead to a heart attack.  Many women have chest discomfort and other common symptoms of CAD. However, women often have different (atypical) symptoms, such as fatigue, vomiting, and dizziness or weakness.  CAD can be treated with lifestyle changes, medicines, surgery, or a combination of these treatments. This information is not intended to replace advice given to you by your health care provider. Make sure you discuss any questions you have with your health care provider. Document Released: 04/29/2011 Document Revised: 01/26/2016 Document Reviewed: 01/26/2016 Elsevier Interactive Patient Education  2018 [place].  Fatty Liver Fatty liver, also called hepatic steatosis or steatohepatitis, is a condition in which too much fat has built up in your liver cells. The liver removes harmful substances from your bloodstream. It produces fluids your body needs. It also helps your body use and store energy from the food you eat. In many cases, fatty liver does not cause symptoms or problems. It is often diagnosed when tests are being done for other reasons. However, over time, fatty liver can cause inflammation that may lead to more serious liver problems, such as scarring of the liver (cirrhosis). What are the causes? Causes of fatty liver may include:  Drinking too much alcohol.  Poor nutrition.  Obesity.  Cushing syndrome.  Diabetes.  Hyperlipidemia.  Pregnancy.  Certain drugs.  Poisons.  Some viral infections.  What increases the risk? You may be more likely to develop fatty liver if you:  Abuse alcohol.  Are pregnant.  Are overweight.  Have diabetes.  Have hepatitis.  Have a high triglyceride level.  What are the signs or symptoms? Fatty liver often does not cause any symptoms. In cases where symptoms develop, they can include:  Fatigue.  Weakness.  Weight  loss.  Confusion.  Abdominal pain.  Yellowing of your skin and the white parts of your eyes (jaundice).  Nausea and vomiting.  How is this diagnosed? Fatty liver may be diagnosed by:  Physical exam and medical history.  Blood tests.  Imaging tests, such as an ultrasound, CT scan, or MRI.  Liver biopsy. A small sample of liver tissue is removed using a needle. The sample is then looked at under a microscope.  How is this treated? Fatty liver is often caused by other health conditions. Treatment for fatty liver may involve medicines and lifestyle changes to manage conditions such as:  Alcoholism.  High cholesterol.  Diabetes.  Being overweight or obese.  Follow these instructions at home:  Eat a healthy diet as directed by your health care provider.  Exercise regularly. This can help you lose weight and control your cholesterol and diabetes. Talk to your health care provider about an exercise plan and which activities are best for you.  Do not drink alcohol.  Take medicines only as directed by  your health care provider. Contact a health care provider if: You have difficulty controlling your:  Blood sugar.  Cholesterol.  Alcohol consumption.  Get help right away if:  You have abdominal pain.  You have jaundice.  You have nausea and vomiting. This information is not intended to replace advice given to you by your health care provider. Make sure you discuss any questions you have with your health care provider. Document Released: 03/22/2005 Document Revised: 07/13/2015 Document Reviewed: 06/16/2013 Elsevier Interactive Patient Education  [employer].

## 2017-09-29 NOTE — Assessment & Plan Note (Signed)
Managed by Dr. Dew 

## 2017-09-29 NOTE — Assessment & Plan Note (Signed)
Goal LDL less than 70; start back on statin; recheck lipids in 6 weeks

## 2017-09-29 NOTE — Progress Notes (Signed)
BP 132/84   Pulse 99   Temp 98.5 F (36.9 C) (Oral)   Resp 12   Ht 5\' 11"  (1.803 m)   Wt 157 lb 11.2 oz (71.5 kg)   SpO2 99%   BMI 21.99 kg/m    Subjective:    Patient ID: Kristie Walters, female    DOB: [birth year], 72 y.o.   MRN: [id number]  HPI: Kristie Walters is a 72 y.o. female  Chief Complaint  Patient presents with  . Results    CT scan    HPI  She had Moh's surgery on the nose; healing up; went to [facility]  Her father died from liver cancer Fatty liver; weight around her middle; normal BMI; does eat bacon and sausage; lots of fried foods Previous TG were 244 and down to 179; Dr. Lucky Cowboy prescribed the statin and she ran out and asked me for refills When we discussed the CAD, she says she sometimes doesn't feel quite right; sometimes gets palpitations; just the other day, last week or two, had an episode, lasted about a minute, not quite a minute; lots of stress and family problems; sister died recently in August 23, 2022; another sister addicted to drugs, she's relapsing right now, so that's stress too; both children have emotional issues, daughter has anxiety and PTSD and bipolar d/o; son has anxiety and panic attacks and there is a lot going on Emphysema on CT; limited activities; has to not overdo and not get overheated; quit smoking last October; can mow her yard and that doesn't bother her as much as walking through the house  Chest CT September 11, 2017  CLINICAL DATA:  Ex-smoker with 56 pack-year history. Quit last year.  EXAM: CT CHEST WITHOUT CONTRAST LOW-DOSE FOR LUNG CANCER SCREENING  TECHNIQUE: Multidetector CT imaging of the chest was performed following the standard protocol without IV contrast.  COMPARISON:  09/10/2016  FINDINGS: Cardiovascular: Aortic and branch vessel atherosclerosis. Normal heart size, without pericardial effusion. Mild lipomatous hypertrophy of the interatrial septum. Lad and right coronary artery atherosclerosis.  Mediastinum/Nodes: No  mediastinal or definite hilar adenopathy, given limitations of unenhanced CT.  Lungs/Pleura: No pleural fluid. Mild centrilobular emphysema. Right-sided solid and sub solid pulmonary nodules are not significantly changed.  Mild biapical pleuroparenchymal scarring.  A left upper lobe/apical ground-glass nodule measures volume derived equivalent diameter 6.9 mm and is not significantly changed.  Upper Abdomen: Mild hepatic steatosis. Normal imaged portions of the spleen, pancreas, gallbladder, adrenal glands, kidneys. A tiny posterior gastric diverticulum.  Musculoskeletal: Right breast 2.0 cm cyst again identified. Multiple thoracic hemangiomas.  IMPRESSION: 1. Lung-RADS 2, benign appearance or behavior. Continue annual screening with low-dose chest CT without contrast in 12 months. 2. Aortic atherosclerosis (ICD10-I70.0), coronary artery atherosclerosis and emphysema (ICD10-J43.9). 3. Mild hepatic steatosis. 4. Right breast cyst, similar.   Electronically Signed   By: Abigail Miyamoto M.D.   On: 09/12/2017 09:43    Depression screen [facility] 2/9 08/01/2017 07/25/2017 11/18/2016 02/27/2016 08/23/2015  Decreased Interest 0 0 1 0 0  Down, Depressed, Hopeless 1 0 1 0 0  PHQ - 2 Score 1 0 2 0 0  Altered sleeping - 0 3 - -  Tired, decreased energy - 0 1 - -  Change in appetite - 0 1 - -  Feeling bad or failure about yourself  - 0 0 - -  Trouble concentrating - 0 0 - -  Moving slowly or fidgety/restless - 0 0 - -  Suicidal thoughts - 0 0 - -  PHQ-9 Score - 0 7 - -  Difficult doing work/chores - Not difficult at all Not difficult at all - -    Relevant past medical, surgical, family and social history reviewed Past Medical History:  Diagnosis Date  . Carotid artery disease ([place])    s/p L. CEA in July 2016  . Centrilobular emphysema ([place]) 09/29/2017  . Collagen vascular disease ([place])    Left carotid stenosis  . COPD (chronic obstructive pulmonary disease) ([place])   . Coronary  artery disease 09/29/2017   Noted on chest CT July 2019  . Elevated blood pressure (not hypertension)   . Fatty liver 09/29/2017   Chest CT July 2019  . GERD (gastroesophageal reflux disease)   . Personal history of tobacco use, presenting hazards to health 08/31/2015  . PONV (postoperative nausea and vomiting)    Past Surgical History:  Procedure Laterality Date  . ENDARTERECTOMY Left 08/25/2014   Procedure: ENDARTERECTOMY CAROTID;  Surgeon: Algernon Huxley, MD;  Location: [facility] ORS;  Service: Vascular;  Laterality: Left;  . MOHS SURGERY    . MOHS SURGERY  09/23/2017   nose  . PERIPHERAL VASCULAR CATHETERIZATION N/A 07/20/2014   Procedure: Carotid Angiography;  Surgeon: Algernon Huxley, MD;  Location: [place] [facility];  Service: Cardiovascular;  Laterality: N/A;  . TONSILLECTOMY    . TUBAL LIGATION     Family History  Problem Relation Age of Onset  . Heart attack Mother   . Hypercholesterolemia Mother   . Hypertension Mother   . Peripheral vascular disease Mother   . Dementia Mother   . Hypothyroidism Mother   . CVA Father   . Liver cancer Father   . Diabetes Brother   . Diabetes Brother   . Alzheimer's disease Brother   . Breast cancer Neg Hx    Social History   Tobacco Use  . Smoking status: Former Smoker    Packs/day: 1.00    Years: 55.00    Pack years: 55.00    Types: Cigarettes    Last attempt to quit: 11/26/2016    Years since quitting: 0.8  . Smokeless tobacco: Current User  . Tobacco comment: smoking cessation materials not required  Substance Use Topics  . Alcohol use: No  . Drug use: No    Interim medical history since last visit reviewed. Allergies and medications reviewed  Review of Systems Per HPI unless specifically indicated above     Objective:    BP 132/84   Pulse 99   Temp 98.5 F (36.9 C) (Oral)   Resp 12   Ht 5\' 11"  (1.803 m)   Wt 157 lb 11.2 oz (71.5 kg)   SpO2 99%   BMI 21.99 kg/m   Wt Readings from Last 3 Encounters:  09/29/17 157 lb  11.2 oz (71.5 kg)  09/11/17 157 lb (71.2 kg)  08/01/17 156 lb 4.8 oz (70.9 kg)    Physical Exam  Constitutional: She appears well-developed and well-nourished.  HENT:  Mouth/Throat: Mucous membranes are normal.  Eyes: EOM are normal. No scleral icterus.  Neck: Carotid bruit is not present.  Cardiovascular: Normal rate and regular rhythm.  Pulmonary/Chest: Effort normal and breath sounds normal. She has no wheezes.  Skin:  Healing incision sites on the nose  Psychiatric: She has a normal mood and affect. Her behavior is normal.    Results for orders placed or performed in visit on 08/01/17  CBC with Differential/Platelet  Result Value Ref Range   WBC 7.4 3.8 -  10.8 Thousand/uL   RBC 4.82 3.80 - 5.10 Million/uL   Hemoglobin 14.1 11.7 - 15.5 g/dL   HCT 40.1 35.0 - 45.0 %   MCV 83.2 80.0 - 100.0 fL   MCH 29.3 27.0 - 33.0 pg   MCHC 35.2 32.0 - 36.0 g/dL   RDW 12.8 11.0 - 15.0 %   Platelets 252 140 - 400 Thousand/uL   MPV 10.6 7.5 - 12.5 fL   Neutro Abs 4,507 1,500 - 7,800 cells/uL   Lymphs Abs 2,102 850 - 3,900 cells/uL   WBC mixed population 577 200 - 950 cells/uL   Eosinophils Absolute 148 15 - 500 cells/uL   Basophils Absolute 67 0 - 200 cells/uL   Neutrophils Relative % 60.9 %   Total Lymphocyte 28.4 %   Monocytes Relative 7.8 %   Eosinophils Relative 2.0 %   Basophils Relative 0.9 %  COMPLETE METABOLIC PANEL WITH GFR  Result Value Ref Range   Glucose, Bld 97 65 - 99 mg/dL   BUN 17 7 - 25 mg/dL   Creat 0.87 0.60 - 0.93 mg/dL   GFR, Est Non African American 67 > OR = 60 mL/min/1.75m2   GFR, Est African American 77 > OR = 60 mL/min/1.50m2   BUN/Creatinine Ratio NOT APPLICABLE 6 - 22 (calc)   Sodium 140 135 - 146 mmol/L   Potassium 3.9 3.5 - 5.3 mmol/L   Chloride 104 98 - 110 mmol/L   CO2 27 20 - 32 mmol/L   Calcium 9.7 8.6 - 10.4 mg/dL   Total Protein 6.8 6.1 - 8.1 g/dL   Albumin 4.3 3.6 - 5.1 g/dL   Globulin 2.5 1.9 - 3.7 g/dL (calc)   AG Ratio 1.7 1.0 - 2.5  (calc)   Total Bilirubin 0.9 0.2 - 1.2 mg/dL   Alkaline phosphatase (APISO) 167 (H) 33 - 130 U/L   AST 22 10 - 35 U/L   ALT 27 6 - 29 U/L  Hemoglobin A1c  Result Value Ref Range   Hgb A1c MFr Bld 5.6 <5.7 % of total Hgb   Mean Plasma Glucose 114 (calc)   eAG (mmol/L) 6.3 (calc)  Lipid panel  Result Value Ref Range   Cholesterol 163 <200 mg/dL   HDL 33 (L) >50 mg/dL   Triglycerides 179 (H) <150 mg/dL   LDL Cholesterol (Calc) 100 (H) mg/dL (calc)   Total CHOL/HDL Ratio 4.9 <5.0 (calc)   Non-HDL Cholesterol (Calc) 130 (H) <130 mg/dL (calc)  Magnesium  Result Value Ref Range   Magnesium 2.0 1.5 - 2.5 mg/dL  Vitamin B12  Result Value Ref Range   Vitamin B-12 297 200 - 1,100 pg/mL  Protein,Total and Elect and IFE,24  Result Value Ref Range   Albumin  %   Alpha-1-Globulin, U NOTE %   Alpha-2-Globulin, U NOTE %   Beta Globulin, U NOTE %   Gamma Globulin, U NOTE %   Interpretation     Interpretation     Creatinine, 24H Ur 1.02 0.50 - 2.15 g/24 h   PROTEIN/CREATININE RATIO 69 < OR = 114 mg/g creat   Protein, 24H Urine 70 0 - 149 mg/24 h  Protein Electrophoresis, (serum)  Result Value Ref Range   Total Protein 7.2 6.1 - 8.1 g/dL   Albumin ELP 4.3 3.8 - 4.8 g/dL   Alpha 1 0.3 0.2 - 0.3 g/dL   Alpha 2 0.8 0.5 - 0.9 g/dL   Beta Globulin 0.5 0.4 - 0.6 g/dL   Beta 2 0.4 0.2 -  0.5 g/dL   Gamma Globulin 0.9 0.8 - 1.7 g/dL   SPE Interp.    TSH  Result Value Ref Range   TSH 3.21 0.40 - 4.50 mIU/L  Alkaline Phosphatase Isoenzymes  Result Value Ref Range   Alkaline phosphatase (APISO) 171 (H) 33 - 130 U/L   Intestinal Isoenzymes 4 1 - 24 %   Bone Isoenzymes 62 28 - 66 %   Liver Isoenzymes 34 25 - 69 %      Assessment & Plan:   Problem List Items Addressed This Visit      Cardiovascular and Mediastinum   Coronary artery disease    Refer to cardiology; showed model of coronary arteries, explained chest CT scan findings; goal LDL less than 70; continue aspirin; see AVS; call 911  if chest pain      Relevant Medications   atorvastatin (LIPITOR) 10 MG tablet   Other Relevant Orders   Ambulatory referral to Cardiology   EKG 12-Lead   Carotid stenosis    Managed by Dr. Lucky Cowboy      Relevant Medications   atorvastatin (LIPITOR) 10 MG tablet   Other Relevant Orders   Lipid panel   Aortic atherosclerosis (HCC) - Primary    Goal LDL less than 70; start back on statin; recheck lipids in 6 weeks      Relevant Medications   atorvastatin (LIPITOR) 10 MG tablet   Other Relevant Orders   Lipid panel     Respiratory   Centrilobular emphysema (HCC)    Somewhat limiting to activities; patient does not want to change meds at this time        Digestive   Fatty liver    Limit saturated fats and fried foods in the diet; control cholesterol      Relevant Orders   ALT     Other   Hyperlipidemia   Relevant Medications   atorvastatin (LIPITOR) 10 MG tablet   Other Relevant Orders   Lipid panel    Other Visit Diagnoses    Sensation of chest pressure       Relevant Orders   Ambulatory referral to Cardiology   EKG 12-Lead       Follow up plan: Return in about 6 weeks (around 11/10/2017) for fasting labs only (or just AFTER).  An after-visit summary was printed and given to the patient at [place].  Please see the patient instructions which may contain other information and recommendations beyond what is mentioned above in the assessment and plan.  Meds ordered this encounter  Medications  . atorvastatin (LIPITOR) 10 MG tablet    Sig: Take 1 tablet (10 mg total) by mouth daily.    Dispense:  90 tablet    Refill:  3    Orders Placed This Encounter  Procedures  . ALT  . Lipid panel  . Ambulatory referral to Cardiology  . EKG 12-Lead

## 2017-10-01 ENCOUNTER — Encounter: Payer: Self-pay | Admitting: Cardiovascular Disease

## 2017-10-01 ENCOUNTER — Ambulatory Visit: Payer: Medicare Other | Admitting: Cardiovascular Disease

## 2017-10-01 VITALS — BP 141/74 | HR 68 | Ht 71.0 in | Wt 157.5 lb

## 2017-10-01 DIAGNOSIS — I700 Atherosclerosis of aorta: Secondary | ICD-10-CM

## 2017-10-01 DIAGNOSIS — J432 Centrilobular emphysema: Secondary | ICD-10-CM | POA: Diagnosis not present

## 2017-10-01 DIAGNOSIS — I6523 Occlusion and stenosis of bilateral carotid arteries: Secondary | ICD-10-CM

## 2017-10-01 DIAGNOSIS — E782 Mixed hyperlipidemia: Secondary | ICD-10-CM

## 2017-10-01 DIAGNOSIS — I25119 Atherosclerotic heart disease of native coronary artery with unspecified angina pectoris: Secondary | ICD-10-CM

## 2017-10-01 MED ORDER — EZETIMIBE 10 MG PO TABS: 10.0000 mg | ORAL_TABLET | Freq: Every day | ORAL | 3 refills | Status: DC

## 2017-10-01 NOTE — Patient Instructions (Addendum)
Medication Instructions:   Please start zetia one a day, for cholesterol Stay on lipitor  Labwork:  No new labs needed  Testing/Procedures:  No further testing at this time   Follow-Up: It was a pleasure seeing you in the office today. Please call us if you have new issues that need to be addressed before your next appt.  [phone]  Your physician wants you to follow-up in:  as needed  If you need a refill on your cardiac medications before your next appointment, please call your pharmacy.  For educational health videos Log in to : [email or web address] Or : [email or web address], password : [email or web address]

## 2017-10-01 NOTE — Progress Notes (Signed)
Cardiology Office Note  Date:  10/01/2017   ID:  Kristie Walters, DOB 1945/05/18, MRN [id number]  PCP:  Kristie Courser, MD   Chief Complaint  Patient presents with  . other    Chest pressure. Meds reviewed verbally with pt.    HPI:  [name] COPD Ex-smoker with 24 pack-year history. Quit last year 11/26/2017 Moh's surgery on the nose; healing up; went to [facility], Carotid stenosis, 1-39% stenosis b/l, CEA on the left Who presents by referral from Dr. Sanda Walters for coronary calcium/CAD  CT scan images reviewed with her in detail in the office today Mild Aortic and branch vessel atherosclerosis. Normal heart size, without pericardial effusion.  mild Lad disease proximal region  Not very active at baseline limited by back pain arthritic pain  Previous records reviewed with her in detail Previous stress test 09/2015 No ischemia  Echo 09/2015 Normal EF >55  No change in the way she feels compared to 09/2015  "spells with SOB" when agitated, or stressed Rare palpitations, with stress. Resolved with rest Denies any regular chest pain or shortness of breath on exertion Does not feel at she has a heart problem Reports that she was off her Lipitor for a period of time  Previously when taking Lipitor on a consistent manner LDL 78 approximately 2017  EKG personally reviewed by myself on todays visit Shows normal sinus rhythm with rate 68 bpm no significant ST or T-wave changes   PMH:   has a past medical history of Carotid artery disease ([place]), Centrilobular emphysema ([place]) (09/29/2017), Collagen vascular disease ([place]), COPD (chronic obstructive pulmonary disease) ([place]), Coronary artery disease (09/29/2017), Elevated blood pressure (not hypertension), Fatty liver (09/29/2017), GERD (gastroesophageal reflux disease), Hyperlipidemia, Personal history of tobacco use, presenting hazards to health (08/31/2015), and PONV (postoperative nausea and vomiting).  PSH:    Past Surgical  History:  Procedure Laterality Date  . ENDARTERECTOMY Left 08/25/2014   Procedure: ENDARTERECTOMY CAROTID;  Surgeon: Algernon Huxley, MD;  Location: [facility] ORS;  Service: Vascular;  Laterality: Left;  . MOHS SURGERY    . MOHS SURGERY  09/23/2017   nose  . PERIPHERAL VASCULAR CATHETERIZATION N/A 07/20/2014   Procedure: Carotid Angiography;  Surgeon: Algernon Huxley, MD;  Location: [place] [facility];  Service: Cardiovascular;  Laterality: N/A;  . TONSILLECTOMY    . TUBAL LIGATION      Current Outpatient Medications  Medication Sig Dispense Refill  . albuterol (PROAIR HFA) 108 (90 Base) MCG/ACT inhaler Inhale 2 puffs into the lungs every 4 (four) hours as needed for wheezing or shortness of breath. 1 Inhaler 0  . aspirin 81 MG tablet Take 81 mg by mouth daily.    [name] atorvastatin (LIPITOR) 10 MG tablet Take 1 tablet (10 mg total) by mouth daily. 90 tablet 3  . calcium carbonate (OS-CAL - DOSED IN MG OF ELEMENTAL CALCIUM) 1250 (500 Ca) MG tablet Take 1 tablet by mouth.    . cholecalciferol (VITAMIN D) 1000 units tablet Take 1,000 Units by mouth daily.    [name] doxycycline (VIBRAMYCIN) 100 MG capsule Take 100 mg by mouth 2 (two) times daily.    [name] Fish Oil-Cholecalciferol (FISH OIL + D3 PO) Take 1,000 mg by mouth 1 day or 1 dose.    . fluticasone (FLONASE) 50 MCG/ACT nasal spray Place 2 sprays into both nostrils daily. (Patient taking differently: Place 2 sprays into both nostrils as needed. ) 16 g 6  . omeprazole (PRILOSEC) 40 MG  capsule Take 40 mg by mouth as needed.     . vitamin B-12 (CYANOCOBALAMIN) 100 MCG tablet Take 100 mcg by mouth daily.     No current facility-administered medications for this visit.      Allergies:   Morphine and Morphine and related   Social History:  The patient  reports that she quit smoking about 10 months ago. Her smoking use included cigarettes. She has a 55.00 pack-year smoking history. She uses smokeless tobacco. She reports that she does not drink alcohol or use drugs.    Family History:   family history includes Alzheimer's disease in her brother; CVA in her father; Dementia in her mother; Diabetes in her brother and brother; Heart attack in her mother; Hypercholesterolemia in her mother; Hypertension in her mother; Hypothyroidism in her mother; Liver cancer in her father; Peripheral vascular disease in her mother.    Review of Systems: Review of Systems  Constitutional: Negative.   Respiratory: Positive for shortness of breath.   Cardiovascular: Negative.   Gastrointestinal: Negative.   Musculoskeletal: Positive for back pain.  Neurological: Negative.   Psychiatric/Behavioral: Negative.   All other systems reviewed and are negative.    PHYSICAL EXAM: VS:  BP (!) 141/74 (BP Location: Right Arm, Patient Position: Sitting, Cuff Size: Normal)   Pulse 68   Ht 5\' 11"  (1.803 m)   Wt 157 lb 8 oz (71.4 kg)   BMI 21.97 kg/m  , BMI Body mass index is 21.97 kg/m. GEN: Well nourished, well developed, in no acute distress  HEENT: normal  Neck: no JVD, carotid bruits, or masses Cardiac: RRR; no murmurs, rubs, or gallops,no edema  Respiratory:  clear to auscultation bilaterally, normal work of breathing GI: soft, nontender, nondistended, + BS MS: no deformity or atrophy  Skin: warm and dry, no rash Neuro:  Strength and sensation are intact Psych: euthymic mood, full affect  Recent Labs: 08/01/2017: ALT 27; BUN 17; Creat 0.87; Hemoglobin 14.1; Magnesium 2.0; Platelets 252; Potassium 3.9; Sodium 140; TSH 3.21    Lipid Panel Lab Results  Component Value Date   CHOL 163 08/01/2017   HDL 33 (L) 08/01/2017   LDLCALC 100 (H) 08/01/2017   TRIG 179 (H) 08/01/2017      Wt Readings from Last 3 Encounters:  10/01/17 157 lb 8 oz (71.4 kg)  09/29/17 157 lb 11.2 oz (71.5 kg)  09/11/17 157 lb (71.2 kg)       ASSESSMENT AND PLAN:  Aortic atherosclerosis ([place]) - Plan: EKG 12-Lead mild aortic atherosclerosis Recommended aggressive lipid  management Suggested she add Zetia to her low-dose Lipitor We'll avoid increasing Lipitor dosing as she reports having periodic cramps  Coronary artery disease involving native coronary artery of native heart with angina pectoris ([place]) - Plan: EKG 12-Lead Mild focal disease proximal LAD region Previous stress test echocardiogram reviewed with her 2017 She does not feel that anything has changed since that time Does not feel that she is having symptoms of angina, feels her breathing is stable As she does not notice a change, declining stress testing at this time Recommended we treat her aggressively, add Zetia She quit smoking last year and is nondiabetic She will call us for any new or worsening symptoms of chest pain or shortness of breath  Mixed hyperlipidemia We'll continue low-dose Lipitor 10 mg daily with Zetia Goal LDL 60  Bilateral carotid artery stenosis History of carotid endarterectomy on the left Most recent carotid ultrasound reviewed with her showing less than 39% disease  bilaterally  COPD Long history of smoking since age 23 Periodic bronchitis but overall stable  Disposition:   F/U as needed   Total encounter time more than 60 minutes  Greater than 50% was spent in counseling and coordination of care with the patient    Orders Placed This Encounter  Procedures  . EKG 12-Lead     Signed, Esmond Plants, M.D., Ph.D. 10/01/2017  [place], [place]

## 2017-11-10 ENCOUNTER — Other Ambulatory Visit: Payer: Self-pay

## 2017-11-10 DIAGNOSIS — K760 Fatty (change of) liver, not elsewhere classified: Secondary | ICD-10-CM

## 2017-11-10 DIAGNOSIS — I6523 Occlusion and stenosis of bilateral carotid arteries: Secondary | ICD-10-CM

## 2017-11-10 DIAGNOSIS — I700 Atherosclerosis of aorta: Secondary | ICD-10-CM | POA: Diagnosis not present

## 2017-11-10 DIAGNOSIS — E785 Hyperlipidemia, unspecified: Secondary | ICD-10-CM | POA: Diagnosis not present

## 2017-11-10 DIAGNOSIS — R748 Abnormal levels of other serum enzymes: Secondary | ICD-10-CM | POA: Diagnosis not present

## 2017-11-11 LAB — LIPID PANEL
Cholesterol: 138 mg/dL (ref <200)
HDL: 36 mg/dL — ABNORMAL LOW (ref >50)
LDL CHOLESTEROL (CALC): 73 mg/dL
NON-HDL CHOLESTEROL (CALC): 102 mg/dL (ref <130)
Total CHOL/HDL Ratio: 3.8 (calc) (ref <5.0)
Triglycerides: 203 mg/dL — ABNORMAL HIGH (ref <150)

## 2017-11-11 LAB — ALT: ALT: 38 U/L — ABNORMAL HIGH (ref 6–29)

## 2017-11-12 DIAGNOSIS — I6523 Occlusion and stenosis of bilateral carotid arteries: Secondary | ICD-10-CM | POA: Diagnosis not present

## 2017-11-12 DIAGNOSIS — I700 Atherosclerosis of aorta: Secondary | ICD-10-CM | POA: Diagnosis not present

## 2017-11-12 DIAGNOSIS — K760 Fatty (change of) liver, not elsewhere classified: Secondary | ICD-10-CM | POA: Diagnosis not present

## 2017-11-12 DIAGNOSIS — E785 Hyperlipidemia, unspecified: Secondary | ICD-10-CM | POA: Diagnosis not present

## 2017-11-12 DIAGNOSIS — R748 Abnormal levels of other serum enzymes: Secondary | ICD-10-CM | POA: Diagnosis not present

## 2017-11-14 LAB — PROTEIN,TOTAL AND ELECT AND IFE,24
Creatinine, 24H Ur: 1.28 g/(24.h) (ref 0.50–2.15)
PROTEIN 24H UR: 75 mg/(24.h) (ref 0–149)
PROTEIN/CREATININE RATIO: 59 mg/g{creat} (ref <=114)

## 2017-11-24 ENCOUNTER — Encounter

## 2017-11-24 ENCOUNTER — Ambulatory Visit: Payer: Medicare Other | Admitting: Cardiovascular Disease

## 2018-02-02 ENCOUNTER — Ambulatory Visit (INDEPENDENT_AMBULATORY_CARE_PROVIDER_SITE_OTHER): Payer: Medicare Other | Admitting: Nurse Practitioner

## 2018-02-02 ENCOUNTER — Ambulatory Visit: Payer: Medicare Other | Admitting: Family Medicine

## 2018-02-02 ENCOUNTER — Encounter: Payer: Self-pay | Admitting: Nurse Practitioner

## 2018-02-02 VITALS — BP 138/76 | HR 81 | Temp 99.7°F | Resp 16 | Ht 71.0 in | Wt 156.2 lb

## 2018-02-02 DIAGNOSIS — I25119 Atherosclerotic heart disease of native coronary artery with unspecified angina pectoris: Secondary | ICD-10-CM

## 2018-02-02 DIAGNOSIS — E782 Mixed hyperlipidemia: Secondary | ICD-10-CM | POA: Diagnosis not present

## 2018-02-02 DIAGNOSIS — R809 Proteinuria, unspecified: Secondary | ICD-10-CM

## 2018-02-02 DIAGNOSIS — I700 Atherosclerosis of aorta: Secondary | ICD-10-CM | POA: Diagnosis not present

## 2018-02-02 DIAGNOSIS — J432 Centrilobular emphysema: Secondary | ICD-10-CM | POA: Diagnosis not present

## 2018-02-02 DIAGNOSIS — K219 Gastro-esophageal reflux disease without esophagitis: Secondary | ICD-10-CM

## 2018-02-02 DIAGNOSIS — M810 Age-related osteoporosis without current pathological fracture: Secondary | ICD-10-CM | POA: Diagnosis not present

## 2018-02-02 DIAGNOSIS — K760 Fatty (change of) liver, not elsewhere classified: Secondary | ICD-10-CM

## 2018-02-02 NOTE — Progress Notes (Signed)
Name: Kristie Walters   MRN: [id number]    DOB: 1945-08-29   Date:02/02/2018       Progress Note  Subjective  Chief Complaint  Chief Complaint  Patient presents with  . Follow-up    HPI  Patient presents for routine follow up on the following chronic conditions:  Aortic atherosclerosis and CAD: takes zetia, fish oils and atorvastatin, daily aspirin  No bruises now but does note easy bruising, no chest pain.   COPD: quit smoking last year. Endorses mild intermittent shortness of breath rarely- takes albuterol PRN  GERD: states has intermittent acid-reflux states usually last a couple of days and then resolves. She is more mindful of her eating then. Triggers: spicy and tomato based. take prilosec just as needed.   Osteoporosis: takes vitamin D supplements when she remembers, not walking as much since its cold.   States she has noticed that when she moves quickly she has a few seconds of feeling a little off balanced- states has noticed it more in the last couple months. No dizziness or falls. Declines physical therapy. Keeps phone with her. Has night lights around the house.   Patient Active Problem List   Diagnosis Date Noted  . Fatty liver 09/29/2017  . Coronary artery disease 09/29/2017  . Centrilobular emphysema ([place]) 09/29/2017  . Osteoporosis 09/10/2017  . Estrogen deficiency 08/01/2017  . Aortic atherosclerosis ([place]) 09/14/2015  . Personal history of tobacco use, presenting hazards to health 08/31/2015  . Paresthesia of foot, bilateral 08/15/2015  . Well woman exam 07/12/2015  . Breast mass, right 07/12/2015  . Elevated alkaline phosphatase level 05/29/2015  . COPD (chronic obstructive pulmonary disease) ([place]) 04/26/2015  . GERD (gastroesophageal reflux disease) 04/26/2015  . Elevated blood pressure (not hypertension) 04/26/2015  . Hyperlipidemia 04/26/2015  . Hyperglycemia 04/26/2015  . Carotid stenosis 08/25/2014  . H/O malignant neoplasm of skin 08/13/2012     Past Medical History:  Diagnosis Date  . Carotid artery disease ([place])    s/p L. CEA in July 2016  . Centrilobular emphysema ([place]) 09/29/2017  . Collagen vascular disease ([place])    Left carotid stenosis  . COPD (chronic obstructive pulmonary disease) ([place])   . Coronary artery disease 09/29/2017   Noted on chest CT July 2019  . Elevated blood pressure (not hypertension)   . Fatty liver 09/29/2017   Chest CT July 2019  . GERD (gastroesophageal reflux disease)   . Hyperlipidemia   . Personal history of tobacco use, presenting hazards to health 08/31/2015  . PONV (postoperative nausea and vomiting)     Past Surgical History:  Procedure Laterality Date  . ENDARTERECTOMY Left 08/25/2014   Procedure: ENDARTERECTOMY CAROTID;  Surgeon: Algernon Huxley, MD;  Location: [facility] ORS;  Service: Vascular;  Laterality: Left;  . MOHS SURGERY    . MOHS SURGERY  09/23/2017   nose  . PERIPHERAL VASCULAR CATHETERIZATION N/A 07/20/2014   Procedure: Carotid Angiography;  Surgeon: Algernon Huxley, MD;  Location: [facility];  Service: Cardiovascular;  Laterality: N/A;  . TONSILLECTOMY    . TUBAL LIGATION      Social History   Tobacco Use  . Smoking status: Former Smoker    Packs/day: 1.00    Years: 55.00    Pack years: 55.00    Types: Cigarettes    Last attempt to quit: 11/26/2016    Years since quitting: 1.1  . Smokeless tobacco: Current User  . Tobacco comment: smoking cessation materials not required  Substance Use  Topics  . Alcohol use: No     Current Outpatient Medications:  .  albuterol (PROAIR HFA) 108 (90 Base) MCG/ACT inhaler, Inhale 2 puffs into the lungs every 4 (four) hours as needed for wheezing or shortness of breath., Disp: 1 Inhaler, Rfl: 0 .  aspirin 81 MG tablet, Take 81 mg by mouth daily., Disp: , Rfl:  .  atorvastatin (LIPITOR) 10 MG tablet, Take 1 tablet (10 mg total) by mouth daily., Disp: 90 tablet, Rfl: 3 .  calcium carbonate (OS-CAL - DOSED IN MG OF ELEMENTAL CALCIUM)  1250 (500 Ca) MG tablet, Take 1 tablet by mouth., Disp: , Rfl:  .  cholecalciferol (VITAMIN D) 1000 units tablet, Take 1,000 Units by mouth daily., Disp: , Rfl:  .  ezetimibe (ZETIA) 10 MG tablet, Take 1 tablet (10 mg total) by mouth daily., Disp: 90 tablet, Rfl: 3 .  Fish Oil-Cholecalciferol (FISH OIL + D3 PO), Take 1,000 mg by mouth 1 day or 1 dose., Disp: , Rfl:  .  fluticasone (FLONASE) 50 MCG/ACT nasal spray, Place 2 sprays into both nostrils daily. (Patient taking differently: Place 2 sprays into both nostrils as needed. ), Disp: 16 g, Rfl: 6 .  doxycycline (VIBRAMYCIN) 100 MG capsule, Take 100 mg by mouth 2 (two) times daily., Disp: , Rfl:  .  omeprazole (PRILOSEC) 40 MG capsule, Take 40 mg by mouth as needed. , Disp: , Rfl:  .  vitamin B-12 (CYANOCOBALAMIN) 100 MCG tablet, Take 100 mcg by mouth daily., Disp: , Rfl:   Allergies  Allergen Reactions  . Morphine Nausea Only and Nausea And Vomiting  . Morphine And Related Nausea And Vomiting    ROS   No other specific complaints in a complete review of systems (except as listed in HPI above).  Objective  Vitals:   02/02/18 1013  BP: 138/76  Pulse: 81  Resp: 16  Temp: 99.7 F (37.6 C)  TempSrc: Oral  SpO2: 99%  Weight: 156 lb 3.2 oz (70.9 kg)  Height: 5\' 11"  (1.803 m)   Body mass index is 21.79 kg/m.  Nursing Note and Vital Signs reviewed.  Physical Exam Constitutional:      Appearance: Normal appearance.  HENT:     Head: Normocephalic and atraumatic.     Right Ear: Tympanic membrane, ear canal and external ear normal.     Left Ear: Tympanic membrane, ear canal and external ear normal.     Nose: Nose normal.     Mouth/Throat:     Mouth: Mucous membranes are moist.     Pharynx: Oropharynx is clear.  Eyes:     Conjunctiva/sclera: Conjunctivae normal.     Pupils: Pupils are equal, round, and reactive to light.  Neck:     Musculoskeletal: Normal range of motion.  Cardiovascular:     Rate and Rhythm: Normal rate  and regular rhythm.     Pulses: Normal pulses.     Heart sounds: Normal heart sounds.  Pulmonary:     Effort: Pulmonary effort is normal.     Breath sounds: Normal breath sounds.  Abdominal:     General: Abdomen is flat.     Tenderness: There is no abdominal tenderness.  Skin:    General: Skin is warm and dry.  Neurological:     General: No focal deficit present.     Mental Status: She is alert and oriented to person, place, and time.  Psychiatric:        Mood and Affect: Mood normal.  No results found for this or any previous visit (from the past 48 hour(s)).  Assessment & Plan  1. Aortic atherosclerosis (HCC) Lipid, bp control continue aspirin   2. Centrilobular emphysema ([place]) Well controlled  3. Gastroesophageal reflux disease, esophagitis presence not specified Stable, avoid triggers, cont treatment  4. Fatty liver diet - COMPLETE METABOLIC PANEL WITH GFR  5. Osteoporosis, unspecified osteoporosis type, unspecified pathological fracture presence Increase weight bearing activities, remember to take vitamin d   6. Mixed hyperlipidemia Cont meds - COMPLETE METABOLIC PANEL WITH GFR - Lipid Profile  7. Coronary artery disease involving native coronary artery of native heart with angina pectoris (HCC) Lipid, bp control and continue asa  8. Proteinuria, unspecified type  - Urinalysis, Routine w reflex microscopic

## 2018-02-02 NOTE — Patient Instructions (Addendum)
Always:  - Increase water intake - Increase activity as tolerated.  Can try the following to have more regular bowel movements: - Metamucil Powder; 1 teaspon start once a day for 3 days then twice a day for 3 days then can go up to 3 times a day if needed.  - Take docusate sodium 100 mg twice a day  _______ Let us know if you would like a referral to physical therapy to help with balance. General recommendations for prevention of osteoporosis: avoid smoking and heavy alcohol consumption, do weight-bearing exercises regularly.  An optimal diet for bone health involves making sure you get enough protein and calories as well as plenty of calcium and vitamin D, which are essential in helping to maintain proper bone formation and density. Postmenopausal women should consume 1200 mg of calcium per day and 800 IU of Vitamin D per day (total of diet plus supplements). The main dietary sources of calcium include milk and other dairy products, such as cottage cheese, yogurt, and hard cheese, and green vegetables, such as kale and broccoli. Sunlight exposure is recommended if tolerated for 30 minutes 5 times a day.    1. Make an appointment with your doctor Begin your fall-prevention plan by making an appointment with your doctor. Be prepared to answer questions such as: [name] What medications are you taking? Make a list of your prescription and over-the-counter medications and supplements, or bring them with you to the appointment. Your doctor can review your medications for side effects and interactions that may increase your risk of falling. To help with fall prevention, your doctor may consider weaning you off medications that make you tired or affect your thinking, such as sedatives and some types of antidepressants.  . Have you fallen before? Write down the details, including when, where and how you fell. Be prepared to discuss instances when you almost fell but were caught by someone or managed to grab hold  of something just in time. Details such as these may help your doctor identify specific fall-prevention strategies.  . Could your health conditions cause a fall? Certain eye and ear disorders may increase your risk of falls. Be prepared to discuss your health conditions and how comfortable you are when you walk - for example, do you feel any dizziness, joint pain, shortness of breath, or numbness in your feet and legs when you walk? Your doctor may evaluate your muscle strength, balance and walking style (gait) as well. 2. Keep moving Physical activity can go a long way toward fall prevention. With your doctor's OK, consider activities such as walking, water workouts or tai chi - a gentle exercise that involves slow and graceful dance-like movements. Such activities reduce the risk of falls by improving strength, balance, coordination and flexibility. If you avoid physical activity because you're afraid it will make a fall more likely, tell your doctor. He or she may recommend carefully monitored exercise programs or refer you to a physical therapist. The physical therapist can create a custom exercise program aimed at improving your balance, flexibility, muscle strength and gait. 3. Wear sensible shoes Consider changing your footwear as part of your fall-prevention plan. High heels, floppy slippers and shoes with slick soles can make you slip, stumble and fall. So can walking in your stocking feet. Instead, wear properly fitting, sturdy shoes with nonskid soles. Sensible shoes may also reduce joint pain.  4. Remove home hazards Take a look around your home. Your living room, kitchen, bedroom, bathroom, hallways and  stairways may be filled with hazards. To make your home safer: . Remove boxes, newspapers, electrical cords and phone cords from walkways.  . Move coffee tables, magazine racks and plant stands from high-traffic areas.  . Secure loose rugs with double-faced tape, tacks or a slip-resistant  backing - or remove loose rugs from your home.  . Repair loose, wooden floorboards and carpeting right away.  . Store clothing, dishes, food and other necessities within easy reach.  . Immediately clean spilled liquids, grease or food.  . Use nonslip mats in your bathtub or shower. Use a bath seat, which allows you to sit while showering. 5. Light up your living space Keep your home brightly lit to avoid tripping on objects that are hard to see. Also: . Place night lights in your bedroom, bathroom and hallways.  . Place a lamp within reach of your bed for middle-of-the-night needs.  . Make clear paths to light switches that aren't near room entrances. Consider trading traditional switches for glow-in-the-dark or illuminated switches.  . Turn on the lights before going up or down stairs.  . Store flashlights in easy-to-find places in case of power outages. 6. Use assistive devices Your doctor might recommend using a cane or walker to keep you steady. Other assistive devices can help, too. For example: . Hand rails for both sides of stairways  . Nonslip treads for bare-wood steps  . A raised toilet seat or one with armrests  . Grab bars for the shower or tub  . A sturdy plastic seat for the shower or tub - plus a hand-held shower nozzle for bathing while sitting down If necessary, ask your doctor for a referral to an occupational therapist. He or she can help you brainstorm other fall-prevention strategies. Some solutions are easily installed and relatively inexpensive. Others may require professional help or a larger investment. If you're concerned about the cost, remember that an investment in fall prevention is an investment in your independence.

## 2018-02-03 LAB — URINALYSIS, ROUTINE W REFLEX MICROSCOPIC
BILIRUBIN URINE: NEGATIVE
Glucose, UA: NEGATIVE
HGB URINE DIPSTICK: NEGATIVE
Ketones, ur: NEGATIVE
Leukocytes, UA: NEGATIVE
Nitrite: NEGATIVE
PROTEIN: NEGATIVE
Specific Gravity, Urine: 1.007 (ref 1.001–1.03)
pH: 5.5 (ref 5.0–8.0)

## 2018-02-03 LAB — COMPLETE METABOLIC PANEL WITH GFR
AG Ratio: 1.6 (calc) (ref 1.0–2.5)
ALKALINE PHOSPHATASE (APISO): 187 U/L — AB (ref 33–130)
ALT: 29 U/L (ref 6–29)
AST: 20 U/L (ref 10–35)
Albumin: 4.4 g/dL (ref 3.6–5.1)
BUN: 14 mg/dL (ref 7–25)
CALCIUM: 10.1 mg/dL (ref 8.6–10.4)
CO2: 28 mmol/L (ref 20–32)
CREATININE: 0.84 mg/dL (ref 0.60–0.93)
Chloride: 107 mmol/L (ref 98–110)
GFR, EST AFRICAN AMERICAN: 80 mL/min/{1.73_m2} (ref >=60)
GFR, Est Non African American: 69 mL/min/{1.73_m2} (ref >=60)
GLOBULIN: 2.7 g/dL (ref 1.9–3.7)
Glucose, Bld: 84 mg/dL (ref 65–99)
Potassium: 4.5 mmol/L (ref 3.5–5.3)
SODIUM: 142 mmol/L (ref 135–146)
Total Bilirubin: 0.5 mg/dL (ref 0.2–1.2)
Total Protein: 7.1 g/dL (ref 6.1–8.1)

## 2018-02-03 LAB — LIPID PANEL
CHOL/HDL RATIO: 6 (calc) — AB (ref <5.0)
Cholesterol: 210 mg/dL — ABNORMAL HIGH (ref <200)
HDL: 35 mg/dL — ABNORMAL LOW (ref >50)
LDL CHOLESTEROL (CALC): 137 mg/dL — AB
NON-HDL CHOLESTEROL (CALC): 175 mg/dL — AB (ref <130)
Triglycerides: 248 mg/dL — ABNORMAL HIGH (ref <150)

## 2018-02-05 ENCOUNTER — Other Ambulatory Visit: Payer: Self-pay | Admitting: Nurse Practitioner

## 2018-02-05 DIAGNOSIS — E782 Mixed hyperlipidemia: Secondary | ICD-10-CM

## 2018-02-05 DIAGNOSIS — R748 Abnormal levels of other serum enzymes: Secondary | ICD-10-CM

## 2018-02-05 DIAGNOSIS — I700 Atherosclerosis of aorta: Secondary | ICD-10-CM

## 2018-02-05 DIAGNOSIS — M810 Age-related osteoporosis without current pathological fracture: Secondary | ICD-10-CM

## 2018-02-05 MED ORDER — ATORVASTATIN CALCIUM 20 MG PO TABS: 10.0000 mg | ORAL_TABLET | Freq: Every day | ORAL | 0 refills | Status: DC

## 2018-02-25 DIAGNOSIS — M810 Age-related osteoporosis without current pathological fracture: Secondary | ICD-10-CM | POA: Insufficient documentation

## 2018-02-25 DIAGNOSIS — K219 Gastro-esophageal reflux disease without esophagitis: Secondary | ICD-10-CM | POA: Diagnosis not present

## 2018-03-25 DIAGNOSIS — M810 Age-related osteoporosis without current pathological fracture: Secondary | ICD-10-CM | POA: Diagnosis not present

## 2018-07-21 ENCOUNTER — Other Ambulatory Visit: Payer: Self-pay

## 2018-07-21 ENCOUNTER — Ambulatory Visit (INDEPENDENT_AMBULATORY_CARE_PROVIDER_SITE_OTHER): Payer: Medicare Other | Admitting: Vascular Surgery

## 2018-07-21 ENCOUNTER — Ambulatory Visit (INDEPENDENT_AMBULATORY_CARE_PROVIDER_SITE_OTHER): Payer: Medicare Other

## 2018-07-21 ENCOUNTER — Encounter (INDEPENDENT_AMBULATORY_CARE_PROVIDER_SITE_OTHER): Payer: Self-pay | Admitting: Vascular Surgery

## 2018-07-21 VITALS — BP 125/72 | HR 67 | Resp 16 | Ht 70.0 in | Wt 154.0 lb

## 2018-07-21 DIAGNOSIS — Z79899 Other long term (current) drug therapy: Secondary | ICD-10-CM | POA: Diagnosis not present

## 2018-07-21 DIAGNOSIS — E782 Mixed hyperlipidemia: Secondary | ICD-10-CM

## 2018-07-21 DIAGNOSIS — I6523 Occlusion and stenosis of bilateral carotid arteries: Secondary | ICD-10-CM

## 2018-07-21 DIAGNOSIS — F17200 Nicotine dependence, unspecified, uncomplicated: Secondary | ICD-10-CM | POA: Diagnosis not present

## 2018-07-21 DIAGNOSIS — Z7982 Long term (current) use of aspirin: Secondary | ICD-10-CM

## 2018-07-21 NOTE — Progress Notes (Signed)
MRN : [id number]  Kristie Walters is a 73 y.o. (Mar 05, 1945) female who presents with chief complaint of  Chief Complaint  Patient presents with  . Follow-up    ultrasound  .  History of Present Illness: Patient returns in follow-up of her carotid disease.  She is doing well today.  She is almost 4 years status post left carotid endarterectomy for high-grade stenosis.  No focal neurologic symptoms since her last visit. Specifically, the patient denies amaurosis fugax, speech or swallowing difficulties, or arm or leg weakness or numbness.  Carotid duplex today shows a patent left carotid endarterectomy and mild carotid artery stenosis in the 1 to 39% range with minimal disease.  Current Outpatient Medications  Medication Sig Dispense Refill  . aspirin 81 MG tablet Take 81 mg by mouth daily.    [name] atorvastatin (LIPITOR) 20 MG tablet Take 0.5 tablets (10 mg total) by mouth daily. 90 tablet 0  . calcium carbonate (OS-CAL - DOSED IN MG OF ELEMENTAL CALCIUM) 1250 (500 Ca) MG tablet Take 1 tablet by mouth.    . cholecalciferol (VITAMIN D) 1000 units tablet Take 1,000 Units by mouth daily.    [name] ezetimibe (ZETIA) 10 MG tablet Take 1 tablet (10 mg total) by mouth daily. 90 tablet 3  . Fish Oil-Cholecalciferol (FISH OIL + D3 PO) Take 1,000 mg by mouth 1 day or 1 dose.    . fluticasone (FLONASE) 50 MCG/ACT nasal spray Place 2 sprays into both nostrils daily. (Patient taking differently: Place 2 sprays into both nostrils as needed. ) 16 g 6  . omeprazole (PRILOSEC) 40 MG capsule Take 40 mg by mouth as needed.     . vitamin B-12 (CYANOCOBALAMIN) 100 MCG tablet Take 100 mcg by mouth daily.    [name] albuterol (PROAIR HFA) 108 (90 Base) MCG/ACT inhaler Inhale 2 puffs into the lungs every 4 (four) hours as needed for wheezing or shortness of breath. (Patient not taking: Reported on 07/21/2018) 1 Inhaler 0   No current facility-administered medications for this visit.     Past Medical History:  Diagnosis Date  .  Carotid artery disease ([place])    s/p L. CEA in July 2016  . Centrilobular emphysema ([place]) 09/29/2017  . Collagen vascular disease ([place])    Left carotid stenosis  . COPD (chronic obstructive pulmonary disease) ([place])   . Coronary artery disease 09/29/2017   Noted on chest CT July 2019  . Elevated blood pressure (not hypertension)   . Fatty liver 09/29/2017   Chest CT July 2019  . GERD (gastroesophageal reflux disease)   . Hyperlipidemia   . Personal history of tobacco use, presenting hazards to health 08/31/2015  . PONV (postoperative nausea and vomiting)     Past Surgical History:  Procedure Laterality Date  . ENDARTERECTOMY Left 08/25/2014   Procedure: ENDARTERECTOMY CAROTID;  Surgeon: Algernon Huxley, MD;  Location: [facility];  Service: Vascular;  Laterality: Left;  . MOHS SURGERY    . MOHS SURGERY  09/23/2017   nose  . PERIPHERAL VASCULAR CATHETERIZATION N/A 07/20/2014   Procedure: Carotid Angiography;  Surgeon: Algernon Huxley, MD;  Location: [facility];  Service: Cardiovascular;  Laterality: N/A;  . TONSILLECTOMY    . TUBAL LIGATION     Social History        Tobacco Use  . Smoking status: Former Smoker    Packs/day: 1.00    Years: 55.00    Pack years: 55.00    Types: Cigarettes  Last attempt to quit: 11/26/2016    Years since quitting: 0.6  . Smokeless tobacco: Current User  Substance Use Topics  . Alcohol use: No  . Drug use: No     Family History      Family History  Problem Relation Age of Onset  . Heart attack Mother   . Hypercholesterolemia Mother   . Hypertension Mother   . Peripheral vascular disease Mother   . Dementia Mother   . Hypothyroidism Mother   . CVA Father   . Liver cancer Father   . Diabetes Brother          Allergies  Allergen Reactions  . Morphine Nausea Only and Nausea And Vomiting  . Morphine And Related Nausea And Vomiting     REVIEW OF SYSTEMS(Negative unless checked)  Constitutional:  [] ?Weight loss[] ?Fever[] ?Chills Cardiac:[] ?Chest pain[] ?Chest pressure[] ?Palpitations [] ?Shortness of breath when laying flat [] ?Shortness of breath at rest [x] ?Shortness of breath with exertion. Vascular: [] ?Pain in legs with walking[] ?Pain in legsat rest[] ?Pain in legs when laying flat [] ?Claudication [] ?Pain in feet when walking [] ?Pain in feet at rest [] ?Pain in feet when laying flat [] ?History of DVT [] ?Phlebitis [] ?Swelling in legs [] ?Varicose veins [] ?Non-healing ulcers Pulmonary: [] ?Uses home oxygen [] ?Productive cough[] ?Hemoptysis [] ?Wheeze [x] ?COPD [] ?Asthma Neurologic: [] ?Dizziness [] ?Blackouts [] ?Seizures [] ?History of stroke [] ?History of TIA[] ?Aphasia [] ?Temporary blindness[] ?Dysphagia [] ?Weaknessor numbness in arms [] ?Weakness or numbnessin legs Musculoskeletal: [] ?Arthritis [] ?Joint swelling [] ?Joint pain [] ?Low back pain Hematologic:[] ?Easy bruising[] ?Easy bleeding [] ?Hypercoagulable state [] ?Anemic [] ?Hepatitis Gastrointestinal:[] ?Blood in stool[] ?Vomiting blood[x] ?Gastroesophageal reflux/heartburn[] ?Difficulty swallowing. Genitourinary: [] ?Chronic kidney disease [] ?Difficulturination [] ?Frequenturination [] ?Burning with urination[] ?Blood in urine Skin: [] ?Rashes [] ?Ulcers [] ?Wounds Psychological: [] ?History of anxiety[] ?History of major depression.    Physical Examination  Vitals:   07/21/18 1016 07/21/18 1017  BP: 125/74 125/72  Pulse: 67   Resp: 16   Weight: 154 lb (69.9 kg)   Height: 5\' 10"  (1.778 m)    Body mass index is 22.1 kg/m. Gen:  WD/WN, NAD Head: [place]/AT, No temporalis wasting. Ear/Nose/Throat: Hearing grossly intact, nares w/o erythema or drainage, trachea midline Eyes: Conjunctiva clear. Sclera non-icteric Neck: Supple.  No bruit  Pulmonary:  Good air movement, equal and clear to auscultation bilaterally.  Cardiac: RRR, No JVD  Vascular:  Vessel Right Left  Radial Palpable Palpable               Musculoskeletal: M/S 5/5 throughout.  No deformity or atrophy. No edema. Neurologic: CN 2-12 intact. Sensation grossly intact in extremities.  Symmetrical.  Speech is fluent. Motor exam as listed above. Psychiatric: Judgment intact, Mood & affect appropriate for pt's clinical situation. Dermatologic: No rashes or ulcers noted.  No cellulitis or open wounds. Lymph : No Cervical, Axillary, or Inguinal lymphadenopathy.     CBC Lab Results  Component Value Date   WBC 7.4 08/01/2017   HGB 14.1 08/01/2017   HCT 40.1 08/01/2017   MCV 83.2 08/01/2017   PLT 252 08/01/2017    BMET    Component Value Date/Time   NA 142 02/02/2018 1122   NA 141 06/29/2015 1219   NA 141 02/20/2014 1016   K 4.5 02/02/2018 1122   K 4.0 02/20/2014 1016   CL 107 02/02/2018 1122   CL 106 02/20/2014 1016   CO2 28 02/02/2018 1122   CO2 31 02/20/2014 1016   GLUCOSE 84 02/02/2018 1122   GLUCOSE 92 02/20/2014 1016   BUN 14 02/02/2018 1122   BUN 11 06/29/2015 1219   BUN 11 02/20/2014 1016   CREATININE 0.84 02/02/2018 1122   CALCIUM 10.1 02/02/2018 1122  CALCIUM 9.2 02/20/2014 1016   GFRNONAA 69 02/02/2018 1122   GFRAA 80 02/02/2018 1122   CrCl cannot be calculated (Patient's most recent lab result is older than the maximum 21 days allowed.).  COAG Lab Results  Component Value Date   INR 1.0 02/20/2014    Radiology No results found.   Assessment/Plan Hyperlipidemia lipid control important in reducing the progression of atherosclerotic disease. Continue statin therapy   Carotid stenosis Her carotid duplex today shows only mild plaque in the right carotid artery with near normal flow and no significant stenosis.  Her left carotid endarterectomy site shows mild intimal thickening with normal velocities and no significant restenosis. Continue aspirin and statin agent.  Recheck in 1 year   Leotis Pain, MD  07/21/2018  12:25 PM    This note was created with Dragon medical transcription system.  Any errors from dictation are purely unintentional

## 2018-07-31 ENCOUNTER — Ambulatory Visit: Payer: Medicare Other | Admitting: Family Medicine

## 2018-07-31 ENCOUNTER — Ambulatory Visit (INDEPENDENT_AMBULATORY_CARE_PROVIDER_SITE_OTHER): Payer: Medicare Other

## 2018-07-31 ENCOUNTER — Other Ambulatory Visit: Payer: Self-pay

## 2018-07-31 ENCOUNTER — Ambulatory Visit (INDEPENDENT_AMBULATORY_CARE_PROVIDER_SITE_OTHER): Payer: Medicare Other | Admitting: Family Medicine

## 2018-07-31 ENCOUNTER — Encounter: Payer: Self-pay | Admitting: Family Medicine

## 2018-07-31 VITALS — BP 118/64 | HR 82 | Temp 98.0°F | Resp 16 | Ht 70.0 in | Wt 151.3 lb

## 2018-07-31 DIAGNOSIS — J432 Centrilobular emphysema: Secondary | ICD-10-CM | POA: Diagnosis not present

## 2018-07-31 DIAGNOSIS — R350 Frequency of micturition: Secondary | ICD-10-CM | POA: Diagnosis not present

## 2018-07-31 DIAGNOSIS — K219 Gastro-esophageal reflux disease without esophagitis: Secondary | ICD-10-CM

## 2018-07-31 DIAGNOSIS — I700 Atherosclerosis of aorta: Secondary | ICD-10-CM | POA: Diagnosis not present

## 2018-07-31 DIAGNOSIS — Z0000 Encounter for general adult medical examination without abnormal findings: Secondary | ICD-10-CM

## 2018-07-31 DIAGNOSIS — K5900 Constipation, unspecified: Secondary | ICD-10-CM

## 2018-07-31 DIAGNOSIS — I6523 Occlusion and stenosis of bilateral carotid arteries: Secondary | ICD-10-CM

## 2018-07-31 DIAGNOSIS — N3000 Acute cystitis without hematuria: Secondary | ICD-10-CM

## 2018-07-31 DIAGNOSIS — E782 Mixed hyperlipidemia: Secondary | ICD-10-CM

## 2018-07-31 DIAGNOSIS — I25119 Atherosclerotic heart disease of native coronary artery with unspecified angina pectoris: Secondary | ICD-10-CM

## 2018-07-31 DIAGNOSIS — R748 Abnormal levels of other serum enzymes: Secondary | ICD-10-CM

## 2018-07-31 DIAGNOSIS — Z1231 Encounter for screening mammogram for malignant neoplasm of breast: Secondary | ICD-10-CM | POA: Diagnosis not present

## 2018-07-31 DIAGNOSIS — M810 Age-related osteoporosis without current pathological fracture: Secondary | ICD-10-CM

## 2018-07-31 LAB — POCT URINALYSIS DIPSTICK
Bilirubin, UA: NEGATIVE
Blood, UA: NEGATIVE
Glucose, UA: NEGATIVE
Ketones, UA: NEGATIVE
Nitrite, UA: NEGATIVE
Protein, UA: POSITIVE — AB
Spec Grav, UA: 1.015 (ref 1.010–1.025)
Urobilinogen, UA: 0.2 E.U./dL
pH, UA: 5 (ref 5.0–8.0)

## 2018-07-31 MED ORDER — POLYETHYLENE GLYCOL 3350 17 GM/SCOOP PO POWD: 17.0000 g | Freq: Two times a day (BID) | ORAL | 1 refills | Status: DC | PRN

## 2018-07-31 MED ORDER — ATORVASTATIN CALCIUM 20 MG PO TABS: 10.0000 mg | ORAL_TABLET | Freq: Every day | ORAL | 0 refills | Status: DC

## 2018-07-31 MED ORDER — SULFAMETHOXAZOLE-TRIMETHOPRIM 800-160 MG PO TABS: 1.0000 | ORAL_TABLET | Freq: Two times a day (BID) | ORAL | 0 refills | Status: AC

## 2018-07-31 NOTE — Progress Notes (Signed)
Subjective:   Kristie Walters is a 73 y.o. female who presents for Medicare Annual (Subsequent) preventive examination.  Review of Systems:   Cardiac Risk Factors include: advanced age (>39men, >15 women);dyslipidemia     Objective:     Vitals: BP 118/64 (BP Location: Left Arm, Patient Position: Sitting, Cuff Size: Normal)   Pulse 82   Temp 98 F (36.7 C) (Oral)   Resp 16   Ht 5\' 10"  (1.778 m)   Wt 151 lb 4.8 oz (68.6 kg)   SpO2 97%   BMI 21.71 kg/m   Body mass index is 21.71 kg/m.  Advanced Directives 07/31/2018 07/25/2017 11/27/2016 11/26/2016 11/18/2016 09/04/2016 07/12/2016  Does Patient Have a Medical Advance Directive? Yes Yes No Yes Yes No Yes  Type of Paramedic of [place];Living will [place];Living will - Healthcare Power of [place];Living will - [place]  Does patient want to make changes to medical advance directive? - - - - - - -  Copy of [place] in Chart? Yes - validated most recent copy scanned in chart (See row information) Yes - - No - copy requested - -  Would patient like information on creating a medical advance directive? - - No - Patient declined - - - -    Tobacco Social History   Tobacco Use  Smoking Status Former Smoker  . Packs/day: 1.00  . Years: 55.00  . Pack years: 55.00  . Types: Cigarettes  . Quit date: 11/26/2016  . Years since quitting: 1.6  Smokeless Tobacco Current User  . Types: Snuff  Tobacco Comment   smoking cessation materials not required     Ready to quit: Not Answered Counseling given: Not Answered Comment: smoking cessation materials not required   Clinical Intake:  Pre-visit preparation completed: Yes  Pain : No/denies pain     BMI - recorded: 21.71 Nutritional Status: BMI of 19-24  Normal Nutritional Risks: None Diabetes: No  How often do you need to have someone help you when you read instructions,  pamphlets, or other written materials from your doctor or pharmacy?: 1 - Never  Interpreter Needed?: No  Information entered by :: Clemetine Marker LPN  Past Medical History:  Diagnosis Date  . Allergy   . Basal cell carcinoma of nose 08/12/2017  . Carotid artery disease ([place])    s/p L. CEA in July 2016  . Centrilobular emphysema ([place]) 09/29/2017  . Collagen vascular disease ([place])    Left carotid stenosis  . COPD (chronic obstructive pulmonary disease) ([place])   . Coronary artery disease 09/29/2017   Noted on chest CT July 2019  . Elevated blood pressure (not hypertension)   . Fatty liver 09/29/2017   Chest CT July 2019  . GERD (gastroesophageal reflux disease)   . Hyperlipidemia   . Personal history of tobacco use, presenting hazards to health 08/31/2015  . PONV (postoperative nausea and vomiting)    Past Surgical History:  Procedure Laterality Date  . ENDARTERECTOMY Left 08/25/2014   Procedure: ENDARTERECTOMY CAROTID;  Surgeon: Algernon Huxley, MD;  Location: [facility] ORS;  Service: Vascular;  Laterality: Left;  . MOHS SURGERY    . MOHS SURGERY  09/23/2017   nose  . PERIPHERAL VASCULAR CATHETERIZATION N/A 07/20/2014   Procedure: Carotid Angiography;  Surgeon: Algernon Huxley, MD;  Location: [facility];  Service: Cardiovascular;  Laterality: N/A;  . TONSILLECTOMY    . TUBAL LIGATION  Family History  Problem Relation Age of Onset  . Heart attack Mother   . Hypercholesterolemia Mother   . Hypertension Mother   . Peripheral vascular disease Mother   . Dementia Mother   . Hypothyroidism Mother   . CVA Father   . Liver cancer Father   . Diabetes Brother   . Kidney cancer Sister   . Diabetes Brother   . Alzheimer's disease Brother   . Other Brother        alzheimers  . Lymphoma Son   . Breast cancer Neg Hx    Social History   Socioeconomic History  . Marital status: Divorced    Spouse name: Not on file  . Number of children: 2  . Years of education: Not on file  . Highest  education level: 10th grade  Occupational History  . Occupation: Retired  Scientific laboratory technician  . Financial resource strain: Not hard at all  . Food insecurity    Worry: Never true    Inability: Never true  . Transportation needs    Medical: No    Non-medical: No  Tobacco Use  . Smoking status: Former Smoker    Packs/day: 1.00    Years: 55.00    Pack years: 55.00    Types: Cigarettes    Quit date: 11/26/2016    Years since quitting: 1.6  . Smokeless tobacco: Current User    Types: Snuff  . Tobacco comment: smoking cessation materials not required  Substance and Sexual Activity  . Alcohol use: No  . Drug use: No  . Sexual activity: Not Currently  Lifestyle  . Physical activity    Days per week: 0 days    Minutes per session: 0 min  . Stress: Not at all  Relationships  . Social connections    Talks on phone: More than three times a week    Gets together: Three times a week    Attends religious service: More than 4 times per year    Active member of club or organization: No    Attends meetings of clubs or organizations: Never    Relationship status: Divorced  Other Topics Concern  . Not on file  Social History Narrative  . Not on file    Outpatient Encounter Medications as of 07/31/2018  Medication Sig  . aspirin 81 MG tablet Take 81 mg by mouth daily.  [name] atorvastatin (LIPITOR) 20 MG tablet Take 0.5 tablets (10 mg total) by mouth daily.  . calcium carbonate (OS-CAL - DOSED IN MG OF ELEMENTAL CALCIUM) 1250 (500 Ca) MG tablet Take 1 tablet by mouth.  . cholecalciferol (VITAMIN D) 1000 units tablet Take 1,000 Units by mouth daily.  [name] ezetimibe (ZETIA) 10 MG tablet Take 1 tablet (10 mg total) by mouth daily.  [name] Fish Oil-Cholecalciferol (FISH OIL + D3 PO) Take 1,000 mg by mouth 1 day or 1 dose.  . fluticasone (FLONASE) 50 MCG/ACT nasal spray Place 2 sprays into both nostrils daily. (Patient taking differently: Place 2 sprays into both nostrils as needed. )  . omeprazole (PRILOSEC)  40 MG capsule Take 40 mg by mouth as needed.   . [DISCONTINUED] albuterol (PROAIR HFA) 108 (90 Base) MCG/ACT inhaler Inhale 2 puffs into the lungs every 4 (four) hours as needed for wheezing or shortness of breath. (Patient not taking: Reported on 07/21/2018)  . [DISCONTINUED] vitamin B-12 (CYANOCOBALAMIN) 100 MCG tablet Take 100 mcg by mouth daily.   No facility-administered encounter medications on file as of 07/31/2018.  Activities of Daily Living In your present state of health, do you have any difficulty performing the following activities: 07/31/2018 02/02/2018  Hearing? Y Y  Comment declines hearing aids -  Vision? N N  Comment reading glasses -  Difficulty concentrating or making decisions? N N  Walking or climbing stairs? N N  Dressing or bathing? N N  Doing errands, shopping? N N  Preparing Food and eating ? N -  Using the Toilet? N -  In the past six months, have you accidently leaked urine? N -  Do you have problems with loss of bowel control? N -  Managing your Medications? N -  Managing your Finances? N -  Housekeeping or managing your Housekeeping? N -  Some recent data might be hidden    Patient Care Team: Lada, Satira Anis, MD as PCP - General (Family Medicine) Dew, Erskine Squibb, MD as Consulting Physician (Vascular Surgery) Jamelle Rushing, MD as Referring Physician (Dermatology)    Assessment:   This is a routine wellness examination for Kristie Walters.  Exercise Activities and Dietary recommendations Current Exercise Habits: Home exercise routine, Type of exercise: Other - see comments(active at home and yardwork), Exercise limited by: None identified  Goals    . DIET - INCREASE WATER INTAKE     Recommend to drink at least 6-8 8oz glasses of water per day.    . Exercise 3x per week (30 min per time)     Recommend walking 3 times per week for at least 30 minutes       Fall Risk Fall Risk  07/31/2018 02/02/2018 08/01/2017 07/25/2017 11/18/2016  Falls in the past year?  0 0 No Yes No  Comment - - - fell carrying laundry down the stairs -  Number falls in past yr: 0 - - 1 -  Injury with Fall? 0 - - No -  Risk for fall due to : - - - Impaired vision Impaired balance/gait;Impaired vision  Risk for fall due to: Comment - - - wears eyeglasses "staggers" when she walks; wears eyeglasses  Follow up Falls prevention discussed - - Falls evaluation completed;Education provided;Falls prevention discussed -   FALL RISK PREVENTION PERTAINING TO THE HOME:  Any stairs in or around the home? Yes  If so, do they handrails? Yes   Home free of loose throw rugs in walkways, pet beds, electrical cords, etc? Yes  Adequate lighting in your home to reduce risk of falls? Yes   ASSISTIVE DEVICES UTILIZED TO PREVENT FALLS:  Life alert? No  Use of a cane, walker or w/c? No  Grab bars in the bathroom? Yes  Shower chair or bench in shower? No  Elevated toilet seat or a handicapped toilet? Yes   DME ORDERS:  DME order needed?  No   TIMED UP AND GO:  Was the test performed? Yes .  Length of time to ambulate 10 feet: 6 sec.   GAIT:  Appearance of gait: Gait stead-fast and without the use of an assistive device.  Education: Fall risk prevention has been discussed.  Intervention(s) required? No   Depression Screen PHQ 2/9 Scores 07/31/2018 02/02/2018 08/01/2017 07/25/2017  PHQ - 2 Score 0 0 1 0  PHQ- 9 Score 3 0 - 0     Cognitive Function     6CIT Screen 07/31/2018 07/25/2017 11/18/2016  What Year? 0 points 0 points 0 points  What month? 0 points 0 points 0 points  What time? 0 points 0 points 0 points  Count back from 20 0 points 0 points 0 points  Months in reverse 0 points 0 points 0 points  Repeat phrase 0 points 0 points 0 points  Total Score 0 0 0    Immunization History  Administered Date(s) Administered  . Influenza, High Dose Seasonal PF 10/08/2016  . Influenza-Unspecified 11/19/2014, 11/16/2015, 10/08/2016  . Pneumococcal Conjugate-13 11/18/2016  .  Pneumococcal Polysaccharide-23 06/11/2012  . Td 02/18/2009    Qualifies for Shingles Vaccine? Yes . Due for Shingrix. Education has been provided regarding the importance of this vaccine. Pt has been advised to call insurance company to determine out of pocket expense. Advised may also receive vaccine at local pharmacy or Health Dept. Verbalized acceptance and understanding.  Tdap: Up to date  Flu Vaccine: Up to date  Pneumococcal Vaccine: Up to date   Screening Tests Health Maintenance  Topic Date Due  . MAMMOGRAM  09/11/2018  . INFLUENZA VACCINE  09/19/2018  . TETANUS/TDAP  02/19/2019  . COLONOSCOPY  06/24/2022  . DEXA SCAN  Completed  . Hepatitis C Screening  Completed  . PNA vac Low Risk Adult  Completed    Cancer Screenings:  Colorectal Screening: Completed 06/23/12. Repeat every 10 years;   Mammogram: Completed 09/10/17. Repeat every year. Ordered today. Pt provided with contact information and advised to call to schedule appt.   Bone Density: Completed 09/10/17. Results reflect OSTEOPOROSIS. Repeat every 2 years.   Lung Cancer Screening: (Low Dose CT Chest recommended if Age 45-80 years, 30 pack-year currently smoking OR have quit w/in 15years.) does qualify. Pt aware of annual 12 month screening and if she is not notifed by [facility] for repeat screening she will contact our office for scheduling.   Additional Screening:  Hepatitis C Screening: does qualify; Completed 11/18/16  Vision Screening: Recommended annual ophthalmology exams for early detection of glaucoma and other disorders of the eye. Is the patient up to date with their annual eye exam?  No  Who is the provider or what is the name of the office in which the pt attends annual eye exams? Not established  If pt is not established with a provider, would they like to be referred to a provider to establish care? No .   Dental Screening: Recommended annual dental exams for proper oral hygiene  Community  Resource Referral:  CRR required this visit?  No      Plan:     I have personally reviewed and addressed the Medicare Annual Wellness questionnaire and have noted the following in the patient's chart:  A. Medical and social history B. Use of alcohol, tobacco or illicit drugs  C. Current medications and supplements D. Functional ability and status E.  Nutritional status F.  Physical activity G. Advance directives H. List of other physicians I.  Hospitalizations, surgeries, and ER visits in previous 12 months J.  [place] such as hearing and vision if needed, cognitive and depression L. Referrals and appointments   In addition, I have reviewed and discussed with patient certain preventive protocols, quality metrics, and best practice recommendations. A written personalized care plan for preventive services as well as general preventive health recommendations were provided to patient.   Signed,  Clemetine Marker, LPN Nurse Health Advisor   Nurse Notes:pt c/o episodes of constipation and bloating over the past few weeks. Stool softeners have helped some but she c/o pain and cramping in stomach. Pt also c/o stinging with urination at times and not being able to fully empty  her bladder. Advised to discuss at same day appt today with Raelyn Ensign NP.

## 2018-07-31 NOTE — Addendum Note (Signed)
Addended by: Hubbard Hartshorn on: 07/31/2018 12:31 PM   Modules accepted: Orders

## 2018-07-31 NOTE — Progress Notes (Signed)
Name: Kristie Walters   MRN: [id number]    DOB: 1945/05/05   Date:07/31/2018       Progress Note  Subjective  Chief Complaint  Chief Complaint  Patient presents with  . Follow-up    6 month recheck  . Urinary Frequency    HPI  UTI: Pt reports about 2 weeks of dysuria, urinary frequency, and vaginal discomfort with urination; has occasional LEFT flank pain.  No fevers/chills, history of kidney stones, NVD, does have some intermittent constipation.  Aortic atherosclerosis and CAD/Carotid Stenosis: takes zetia, fish oils and atorvastatin, daily aspirin  No bruises now but does note easy bruising, no chest pain.  No shortness of breath, no signs or symptoms of stroke. Saw Dr. Rockey Situ August 2019 and he added Zetia, told her to follow up only as needed.   COPD/Emphysema: quit smoking 2018. Endorses mild intermittent shortness of breath rarely- takes albuterol very seldomly. She will call our office in July for CT Chest, wants to wait and talk to cancer center first.   GERD: states has intermittent acid-reflux states usually last a couple of days and then resolves. She is more mindful of her eating and does not eat late at night. Triggers: spicy and tomato based. Taking prilosec just as needed and OTC. No difficulty swallowing.  Osteoporosis: takes vitamin D supplements when she remembers, not walking as much lately, but has been remodeling her kitchen. Bone Density was done in 2019. Seeing Dr. Gabriel Carina and had Reclast in January 2020.  Has 6 month follow up coming up in July.  Patient Active Problem List   Diagnosis Date Noted  . 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 Fatty liver 09/29/2017  . Coronary artery disease 09/29/2017  . Centrilobular emphysema ([place]) 09/29/2017  . Osteoporosis 09/10/2017  . Basal cell carcinoma of nose 08/12/2017  . Estrogen deficiency 08/01/2017  . Aortic atherosclerosis ([place]) 09/14/2015  . Personal history of tobacco use, presenting hazards to health  08/31/2015  . Paresthesia of foot, bilateral 08/15/2015  . Well woman exam 07/12/2015  . Breast mass, right 07/12/2015  . Elevated alkaline phosphatase level 05/29/2015  . COPD (chronic obstructive pulmonary disease) ([place]) 04/26/2015  . GERD (gastroesophageal reflux disease) 04/26/2015  . Elevated blood pressure (not hypertension) 04/26/2015  . Hyperlipidemia 04/26/2015  . Hyperglycemia 04/26/2015  . Carotid stenosis 08/25/2014  . H/O malignant neoplasm of skin 08/13/2012    Past Surgical History:  Procedure Laterality Date  . ENDARTERECTOMY Left 08/25/2014   Procedure: ENDARTERECTOMY CAROTID;  Surgeon: Algernon Huxley, MD;  Location: [facility] ORS;  Service: Vascular;  Laterality: Left;  . MOHS SURGERY    . MOHS SURGERY  09/23/2017   nose  . PERIPHERAL VASCULAR CATHETERIZATION N/A 07/20/2014   Procedure: Carotid Angiography;  Surgeon: Algernon Huxley, MD;  Location: [facility];  Service: Cardiovascular;  Laterality: N/A;  . TONSILLECTOMY    . TUBAL LIGATION      Family History  Problem Relation Age of Onset  . Heart attack Mother   . Hypercholesterolemia Mother   . Hypertension Mother   . Peripheral vascular disease Mother   . Dementia Mother   . Hypothyroidism Mother   . CVA Father   . Liver cancer Father   . Diabetes Brother   . Kidney cancer Sister   . Diabetes Brother   . Alzheimer's disease Brother   . Other Brother        alzheimers  . Lymphoma Son   . Breast cancer Neg Hx  Social History   Socioeconomic History  . Marital status: Divorced    Spouse name: Not on file  . Number of children: 2  . Years of education: Not on file  . Highest education level: 10th grade  Occupational History  . Occupation: Retired  Scientific laboratory technician  . Financial resource strain: Not hard at all  . Food insecurity    Worry: Never true    Inability: Never true  . Transportation needs    Medical: No    Non-medical: No  Tobacco Use  . Smoking status: Former Smoker    Packs/day:  1.00    Years: 55.00    Pack years: 55.00    Types: Cigarettes    Quit date: 11/26/2016    Years since quitting: 1.6  . Smokeless tobacco: Current User    Types: Snuff  . Tobacco comment: smoking cessation materials not required  Substance and Sexual Activity  . Alcohol use: No  . Drug use: No  . Sexual activity: Not Currently  Lifestyle  . Physical activity    Days per week: 0 days    Minutes per session: 0 min  . Stress: Not at all  Relationships  . Social connections    Talks on phone: More than three times a week    Gets together: Three times a week    Attends religious service: More than 4 times per year    Active member of club or organization: No    Attends meetings of clubs or organizations: Never    Relationship status: Divorced  . Intimate partner violence    Fear of current or ex partner: No    Emotionally abused: No    Physically abused: No    Forced sexual activity: No  Other Topics Concern  . Not on file  Social History Narrative  . Not on file     Current Outpatient Medications:  .  aspirin 81 MG tablet, Take 81 mg by mouth daily., Disp: , Rfl:  .  atorvastatin (LIPITOR) 20 MG tablet, Take 0.5 tablets (10 mg total) by mouth daily., Disp: 90 tablet, Rfl: 0 .  calcium carbonate (OS-CAL - DOSED IN MG OF ELEMENTAL CALCIUM) 1250 (500 Ca) MG tablet, Take 1 tablet by mouth., Disp: , Rfl:  .  cholecalciferol (VITAMIN D) 1000 units tablet, Take 1,000 Units by mouth daily., Disp: , Rfl:  .  ezetimibe (ZETIA) 10 MG tablet, Take 1 tablet (10 mg total) by mouth daily., Disp: 90 tablet, Rfl: 3 .  Fish Oil-Cholecalciferol (FISH OIL + D3 PO), Take 1,000 mg by mouth 1 day or 1 dose., Disp: , Rfl:  .  fluticasone (FLONASE) 50 MCG/ACT nasal spray, Place 2 sprays into both nostrils daily. (Patient taking differently: Place 2 sprays into both nostrils as needed. ), Disp: 16 g, Rfl: 6 .  omeprazole (PRILOSEC) 40 MG capsule, Take 40 mg by mouth as needed. , Disp: , Rfl:    Allergies  Allergen Reactions  . Morphine Nausea Only and Nausea And Vomiting  . Morphine And Related Nausea And Vomiting    I personally reviewed active problem list, medication list, allergies, notes from last encounter, lab results with the patient/caregiver today.   ROS  Constitutional: Negative for fever or weight change.  Respiratory: Negative for cough and shortness of breath.   Cardiovascular: Negative for chest pain or palpitations.  Gastrointestinal: See HPI Musculoskeletal: Negative for gait problem or joint swelling.  Skin: Negative for rash.  Neurological: Negative for dizziness or  headache.  No other specific complaints in a complete review of systems (except as listed in HPI above).  Objective  Vitals:   07/31/18 1116  BP: 118/64  Pulse: 82  Resp: 16  Temp: 98 F (36.7 C)  TempSrc: Oral  SpO2: 97%  Weight: 151 lb 4.8 oz (68.6 kg)  Height: 5\' 10"  (1.778 m)   Body mass index is 21.71 kg/m.  Physical Exam  Constitutional: Patient appears well-developed and well-nourished. No distress.  HENT: Head: Normocephalic and atraumatic. Eyes: Conjunctivae and EOM are normal. No scleral icterus.  Neck: Normal range of motion. Neck supple. No JVD present.  Cardiovascular: Normal rate, regular rhythm and normal heart sounds.  No murmur heard. No BLE edema. Pulmonary/Chest: Effort normal and breath sounds normal. No respiratory distress. Abdominal: Soft. Bowel sounds are normal, no distension. There is no tenderness. No masses. No CVA tenderness.  Musculoskeletal: Normal range of motion, no joint effusions. No gross deformities Neurological: Pt is alert and oriented to person, place, and time. No cranial nerve deficit. Coordination, balance, strength, speech and gait are normal.  Skin: Skin is warm and dry. No rash noted. No erythema.  Psychiatric: Patient has a normal mood and affect. behavior is normal. Judgment and thought content normal.  Results for orders  placed or performed in visit on 07/31/18 (from the past 72 hour(s))  POCT urinalysis dipstick     Status: Abnormal   Collection Time: 07/31/18 11:24 AM  Result Value Ref Range   Color, UA yellow    Clarity, UA clear    Glucose, UA Negative Negative   Bilirubin, UA negative    Ketones, UA negative    Spec Grav, UA 1.015 1.010 - 1.025   Blood, UA negative    pH, UA 5.0 5.0 - 8.0   Protein, UA Positive (A) Negative   Urobilinogen, UA 0.2 0.2 or 1.0 E.U./dL   Nitrite, UA negative    Leukocytes, UA Large (3+) (A) Negative   Appearance clear    Odor none    PHQ2/9: Depression screen [facility] 2/9 07/31/2018 02/02/2018 08/01/2017 07/25/2017 11/18/2016  Decreased Interest 0 0 0 0 1  Down, Depressed, Hopeless 0 0 1 0 1  PHQ - 2 Score 0 0 1 0 2  Altered sleeping 3 0 - 0 3  Tired, decreased energy 0 0 - 0 1  Change in appetite 0 0 - 0 1  Feeling bad or failure about yourself  0 0 - 0 0  Trouble concentrating 0 0 - 0 0  Moving slowly or fidgety/restless 0 0 - 0 0  Suicidal thoughts 0 0 - 0 0  PHQ-9 Score 3 0 - 0 7  Difficult doing work/chores Not difficult at all Not difficult at all - Not difficult at all Not difficult at all   PHQ-2/9 Result is negative.    Fall Risk: Fall Risk  07/31/2018 02/02/2018 08/01/2017 07/25/2017 11/18/2016  Falls in the past year? 0 0 No Yes No  Comment - - - fell carrying laundry down the stairs -  Number falls in past yr: 0 - - 1 -  Injury with Fall? 0 - - No -  Risk for fall due to : - - - Impaired vision Impaired balance/gait;Impaired vision  Risk for fall due to: Comment - - - wears eyeglasses "staggers" when she walks; wears eyeglasses  Follow up Falls prevention discussed - - Falls evaluation completed;Education provided;Falls prevention discussed -   Assessment & Plan  1. Frequency of urination -  POCT urinalysis dipstick  2. Acute cystitis without hematuria - Kidney function WNL at last visit. - sulfamethoxazole-trimethoprim (BACTRIM DS) 800-160 MG  tablet; Take 1 tablet by mouth 2 (two) times daily for 3 days.  Dispense: 6 tablet; Refill: 0  3. Centrilobular emphysema ([place]) - Doing well on PRN albuterol  4. Aortic atherosclerosis (HCC) - atorvastatin (LIPITOR) 20 MG tablet; Take 0.5 tablets (10 mg total) by mouth daily.  Dispense: 90 tablet; Refill: 0 - Lipid panel  5. Coronary artery disease involving native coronary artery of native heart with angina pectoris (HCC) - Continue Zetia and Atorvastatin - Lipid panel  6. Bilateral carotid artery stenosis - Continue Zetia and Atorvastatin - Lipid panel  7. Centrilobular emphysema ([place]) - Doing well on PRN albuterol  8. Gastroesophageal reflux disease, esophagitis presence not specified - Omeprazole PRN, lifestyle modification.  9. Aortic atherosclerosis (HCC) - Continue Zetia and Atorvastatin - atorvastatin (LIPITOR) 20 MG tablet; Take 0.5 tablets (10 mg total) by mouth daily.  Dispense: 90 tablet; Refill: 0 - Lipid panel  10. Mixed hyperlipidemia - Continue Zetia and Atorvastatin - atorvastatin (LIPITOR) 20 MG tablet; Take 0.5 tablets (10 mg total) by mouth daily.  Dispense: 90 tablet; Refill: 0 - Lipid panel  11. Postmenopausal osteoporosis - Follow up with Dr. Gabriel Carina  12. Elevated alkaline phosphatase level - COMPLETE METABOLIC PANEL WITH GFR - Needs to follow up with Dr. Gabriel Carina

## 2018-07-31 NOTE — Patient Instructions (Signed)
Kristie Walters , Thank you for taking time to come for your Medicare Wellness Visit. I appreciate your ongoing commitment to your health goals. Please review the following plan we discussed and let me know if I can assist you in the future.   Screening recommendations/referrals: Colonoscopy: done 06/23/12. Repeat in 2024 Mammogram: done 09/10/17. Please call [phone] to schedule your mammogram.  Bone Density: done 09/10/17 Recommended yearly ophthalmology/optometry visit for glaucoma screening and checkup Recommended yearly dental visit for hygiene and checkup  Vaccinations: Influenza vaccine: done 10/22/17 Pneumococcal vaccine: done 11/18/16 Tdap vaccine: done 2011 Shingles vaccine: Shingrix discussed. Please contact your pharmacy for coverage information.   Conditions/risks identified: Recommend drinking 6-8 glasses of water per day  Next appointment: Please follow up in one year for your Medicare Annual Wellness visit.     Preventive Care 73 Years and Older, Female Preventive care refers to lifestyle choices and visits with your health care provider that can promote health and wellness. What does preventive care include?  A yearly physical exam. This is also called an annual well check.  Dental exams once or twice a year.  Routine eye exams. Ask your health care provider how often you should have your eyes checked.  Personal lifestyle choices, including:  Daily care of your teeth and gums.  Regular physical activity.  Eating a healthy diet.  Avoiding tobacco and drug use.  Limiting alcohol use.  Practicing safe sex.  Taking low-dose aspirin every day.  Taking vitamin and mineral supplements as recommended by your health care provider. What happens during an annual well check? The services and screenings done by your health care provider during your annual well check will depend on your age, overall health, lifestyle risk factors, and family history of disease. Counseling   Your health care provider may ask you questions about your:  Alcohol use.  Tobacco use.  Drug use.  Emotional well-being.  Home and relationship well-being.  Sexual activity.  Eating habits.  History of falls.  Memory and ability to understand (cognition).  Work and work Statistician.  Reproductive health. Screening  You may have the following tests or measurements:  Height, weight, and BMI.  Blood pressure.  Lipid and cholesterol levels. These may be checked every 5 years, or more frequently if you are over 89 years old.  Skin check.  Lung cancer screening. You may have this screening every year starting at age 73 if you have a 30-pack-year history of smoking and currently smoke or have quit within the past 73 years.  Fecal occult blood test (FOBT) of the stool. You may have this test every year starting at age 73.  Flexible sigmoidoscopy or colonoscopy. You may have a sigmoidoscopy every 5 years or a colonoscopy every 10 years starting at age 73.  Hepatitis C blood test.  Hepatitis B blood test.  Sexually transmitted disease (STD) testing.  Diabetes screening. This is done by checking your blood sugar (glucose) after you have not eaten for a while (fasting). You may have this done every 1-3 years.  Bone density scan. This is done to screen for osteoporosis. You may have this done starting at age 73.  Mammogram. This may be done every 1-2 years. Talk to your health care provider about how often you should have regular mammograms. Talk with your health care provider about your test results, treatment options, and if necessary, the need for more tests. Vaccines  Your health care provider may recommend certain vaccines, such as:  Influenza vaccine.  This is recommended every year.  Tetanus, diphtheria, and acellular pertussis (Tdap, Td) vaccine. You may need a Td booster every 10 years.  Zoster vaccine. You may need this after age 73.  Pneumococcal 13-valent  conjugate (PCV13) vaccine. One dose is recommended after age 73.  Pneumococcal polysaccharide (PPSV23) vaccine. One dose is recommended after age 73. Talk to your health care provider about which screenings and vaccines you need and how often you need them. This information is not intended to replace advice given to you by your health care provider. Make sure you discuss any questions you have with your health care provider. Document Released: 03/03/2015 Document Revised: 10/25/2015 Document Reviewed: 12/06/2014 Elsevier Interactive Patient Education  2017 [employer] Prevention in the Home Falls can cause injuries. They can happen to people of all ages. There are many things you can do to make your home safe and to help prevent falls. What can I do on the outside of my home?  Regularly fix the edges of walkways and driveways and fix any cracks.  Remove anything that might make you trip as you walk through a door, such as a raised step or threshold.  Trim any bushes or trees on the path to your home.  Use bright outdoor lighting.  Clear any walking paths of anything that might make someone trip, such as rocks or tools.  Regularly check to see if handrails are loose or broken. Make sure that both sides of any steps have handrails.  Any raised decks and porches should have guardrails on the edges.  Have any leaves, snow, or ice cleared regularly.  Use sand or salt on walking paths during winter.  Clean up any spills in your garage right away. This includes oil or grease spills. What can I do in the bathroom?  Use night lights.  Install grab bars by the toilet and in the tub and shower. Do not use towel bars as grab bars.  Use non-skid mats or decals in the tub or shower.  If you need to sit down in the shower, use a plastic, non-slip stool.  Keep the floor dry. Clean up any water that spills on the floor as soon as it happens.  Remove soap buildup in the tub or  shower regularly.  Attach bath mats securely with double-sided non-slip rug tape.  Do not have throw rugs and other things on the floor that can make you trip. What can I do in the bedroom?  Use night lights.  Make sure that you have a light by your bed that is easy to reach.  Do not use any sheets or blankets that are too big for your bed. They should not hang down onto the floor.  Have a firm chair that has side arms. You can use this for support while you get dressed.  Do not have throw rugs and other things on the floor that can make you trip. What can I do in the kitchen?  Clean up any spills right away.  Avoid walking on wet floors.  Keep items that you use a lot in easy-to-reach places.  If you need to reach something above you, use a strong step stool that has a grab bar.  Keep electrical cords out of the way.  Do not use floor polish or wax that makes floors slippery. If you must use wax, use non-skid floor wax.  Do not have throw rugs and other things on the floor that can make  you trip. What can I do with my stairs?  Do not leave any items on the stairs.  Make sure that there are handrails on both sides of the stairs and use them. Fix handrails that are broken or loose. Make sure that handrails are as long as the stairways.  Check any carpeting to make sure that it is firmly attached to the stairs. Fix any carpet that is loose or worn.  Avoid having throw rugs at the top or bottom of the stairs. If you do have throw rugs, attach them to the floor with carpet tape.  Make sure that you have a light switch at the top of the stairs and the bottom of the stairs. If you do not have them, ask someone to add them for you. What else can I do to help prevent falls?  Wear shoes that:  Do not have high heels.  Have rubber bottoms.  Are comfortable and fit you well.  Are closed at the toe. Do not wear sandals.  If you use a stepladder:  Make sure that it is fully  opened. Do not climb a closed stepladder.  Make sure that both sides of the stepladder are locked into place.  Ask someone to hold it for you, if possible.  Clearly mark and make sure that you can see:  Any grab bars or handrails.  First and last steps.  Where the edge of each step is.  Use tools that help you move around (mobility aids) if they are needed. These include:  Canes.  Walkers.  Scooters.  Crutches.  Turn on the lights when you go into a dark area. Replace any light bulbs as soon as they burn out.  Set up your furniture so you have a clear path. Avoid moving your furniture around.  If any of your floors are uneven, fix them.  If there are any pets around you, be aware of where they are.  Review your medicines with your doctor. Some medicines can make you feel dizzy. This can increase your chance of falling. Ask your doctor what other things that you can do to help prevent falls. This information is not intended to replace advice given to you by your health care provider. Make sure you discuss any questions you have with your health care provider. Document Released: 12/01/2008 Document Revised: 07/13/2015 Document Reviewed: 03/11/2014 Elsevier Interactive Patient Education  2017 [employer].

## 2018-08-04 ENCOUNTER — Ambulatory Visit: Payer: Medicare Other | Admitting: Family Medicine

## 2018-08-06 DIAGNOSIS — I6523 Occlusion and stenosis of bilateral carotid arteries: Secondary | ICD-10-CM | POA: Diagnosis not present

## 2018-08-06 DIAGNOSIS — R748 Abnormal levels of other serum enzymes: Secondary | ICD-10-CM | POA: Diagnosis not present

## 2018-08-06 DIAGNOSIS — I700 Atherosclerosis of aorta: Secondary | ICD-10-CM | POA: Diagnosis not present

## 2018-08-06 DIAGNOSIS — E782 Mixed hyperlipidemia: Secondary | ICD-10-CM | POA: Diagnosis not present

## 2018-08-06 DIAGNOSIS — I25119 Atherosclerotic heart disease of native coronary artery with unspecified angina pectoris: Secondary | ICD-10-CM | POA: Diagnosis not present

## 2018-08-07 LAB — COMPLETE METABOLIC PANEL WITH GFR
AG Ratio: 1.8 (calc) (ref 1.0–2.5)
ALT: 20 U/L (ref 6–29)
AST: 18 U/L (ref 10–35)
Albumin: 4.3 g/dL (ref 3.6–5.1)
Alkaline phosphatase (APISO): 82 U/L (ref 37–153)
BUN: 14 mg/dL (ref 7–25)
CO2: 27 mmol/L (ref 20–32)
Calcium: 9.5 mg/dL (ref 8.6–10.4)
Chloride: 105 mmol/L (ref 98–110)
Creat: 0.88 mg/dL (ref 0.60–0.93)
GFR, Est African American: 76 mL/min/{1.73_m2} (ref >=60)
GFR, Est Non African American: 65 mL/min/{1.73_m2} (ref >=60)
Globulin: 2.4 g/dL (calc) (ref 1.9–3.7)
Glucose, Bld: 103 mg/dL — ABNORMAL HIGH (ref 65–99)
Potassium: 4.2 mmol/L (ref 3.5–5.3)
Sodium: 139 mmol/L (ref 135–146)
Total Bilirubin: 0.7 mg/dL (ref 0.2–1.2)
Total Protein: 6.7 g/dL (ref 6.1–8.1)

## 2018-08-07 LAB — LIPID PANEL
Cholesterol: 145 mg/dL (ref <200)
HDL: 36 mg/dL — ABNORMAL LOW (ref >=50)
LDL Cholesterol (Calc): 84 mg/dL (calc)
Non-HDL Cholesterol (Calc): 109 mg/dL (calc) (ref <130)
Total CHOL/HDL Ratio: 4 (calc) (ref <5.0)
Triglycerides: 151 mg/dL — ABNORMAL HIGH (ref <150)

## 2018-09-07 ENCOUNTER — Telehealth: Payer: Self-pay | Admitting: *Deleted

## 2018-09-07 DIAGNOSIS — Z87891 Personal history of nicotine dependence: Secondary | ICD-10-CM

## 2018-09-07 DIAGNOSIS — Z122 Encounter for screening for malignant neoplasm of respiratory organs: Secondary | ICD-10-CM

## 2018-09-07 NOTE — Telephone Encounter (Signed)
Patient has been notified that annual lung cancer screening low dose CT scan is due currently or will be in near future. Confirmed that patient is within the age range of 55-77, and asymptomatic, (no signs or symptoms of lung cancer). Patient denies illness that would prevent curative treatment for lung cancer if found. Verified smoking history, (former, quit 2019, 56 pack year). The shared decision making visit was done 09/01/15. Patient is agreeable for CT scan being scheduled.

## 2018-09-07 NOTE — Telephone Encounter (Signed)
Left message for patient to notify them that it is time to schedule annual low dose lung cancer screening CT scan. Instructed patient to call back to verify information prior to the scan being scheduled.  

## 2018-09-15 IMAGING — CT CT ABD-PELV W/ CM
2 of 5 series · 16 of 46 positions shown, 18 images · IV contrast (iopamidol)
Comparison: 02/20/2014

CLINICAL DATA: Generalized abdominal pain for 2 days with nausea,
constipation yesterday, took Karly then had diarrhea today,
abdominal distension, history COPD

EXAM:
CT ABDOMEN AND PELVIS WITH CONTRAST
TECHNIQUE: Multidetector CT imaging of the abdomen and pelvis was performed
using the standard protocol following bolus administration of
intravenous contrast. Sagittal and coronal MPR images reconstructed
from axial data set.
CONTRAST:  100mL AKCIYG-WDD IOPAMIDOL (AKCIYG-WDD) INJECTION 61% IV.
No oral contrast administered.

[Series 2: routine abd/pel with · axial · 0.76mm/px · z∈[-450,-45]mm · 13 of 91 slices shown, 15 images]
[im 5/91  soft-tissue]
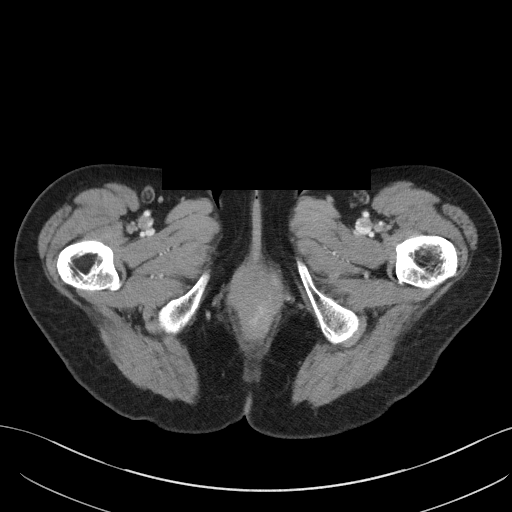
[im 5/91  bone]
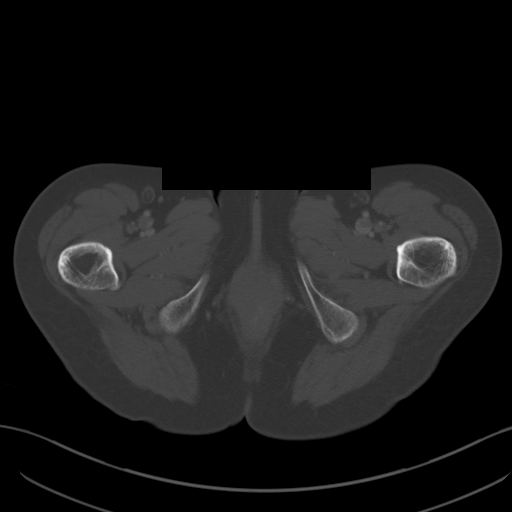
[im 14/91  soft-tissue]
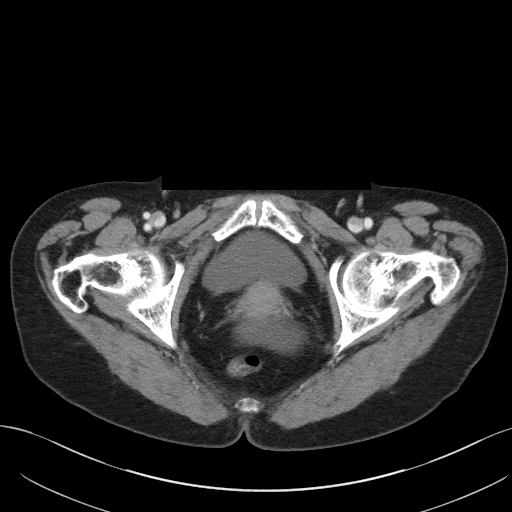
[im 19/91  soft-tissue]
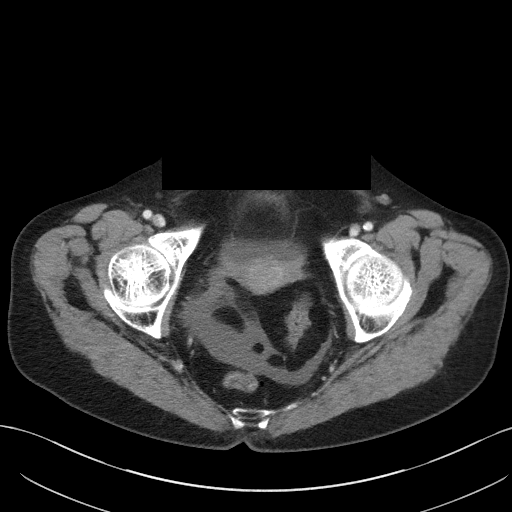
[im 28/91  soft-tissue]
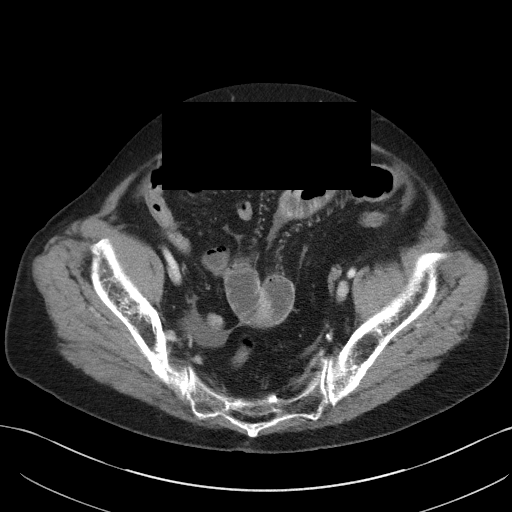
[im 32/91  soft-tissue]
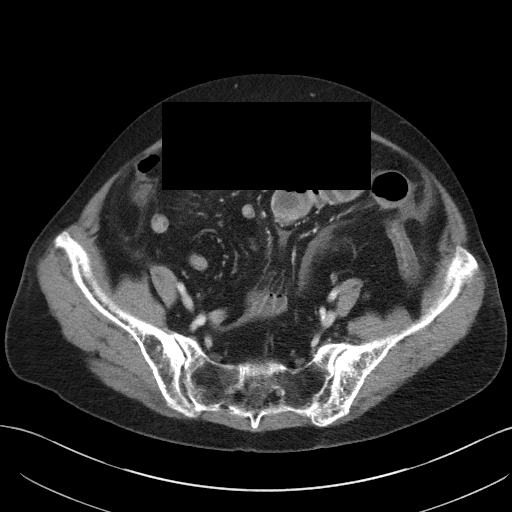
[im 41/91  soft-tissue]
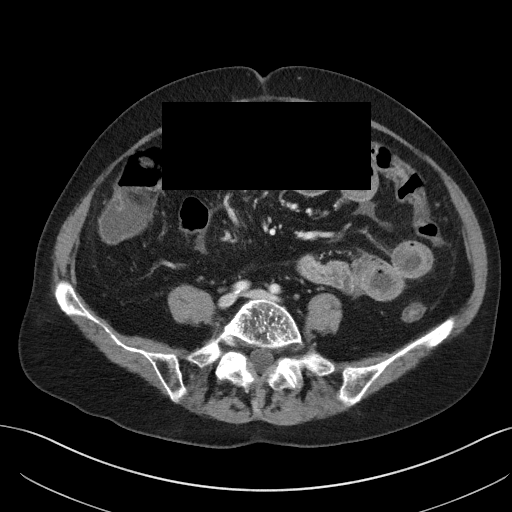
[im 46/91  soft-tissue]
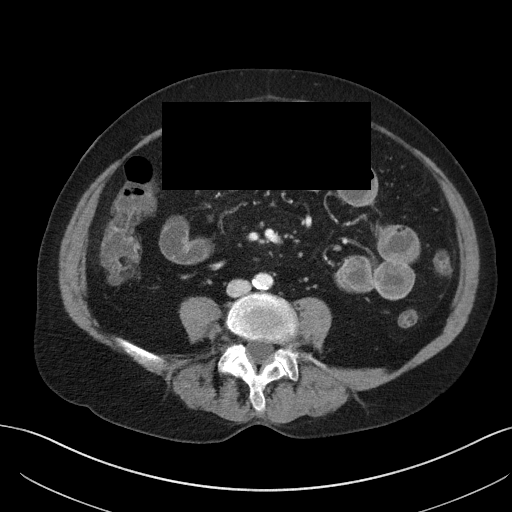
[im 50/91  soft-tissue]
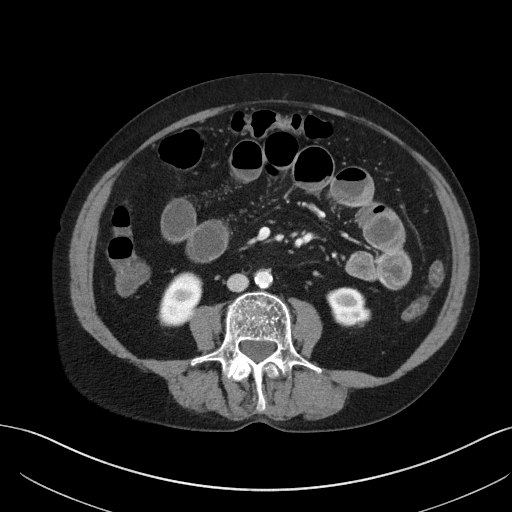
[im 59/91  soft-tissue]
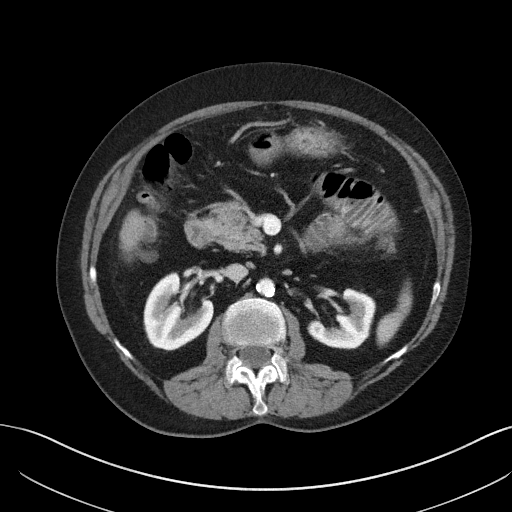
[im 59/91  bone]
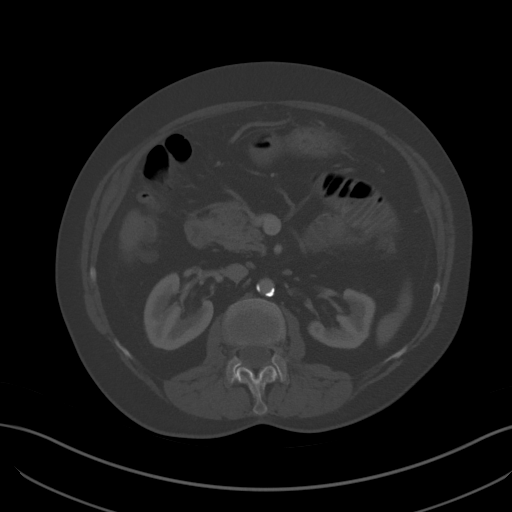
[im 64/91  soft-tissue]
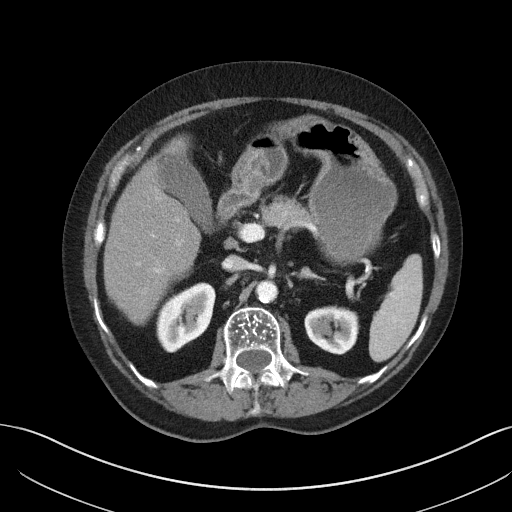
[im 73/91  soft-tissue]
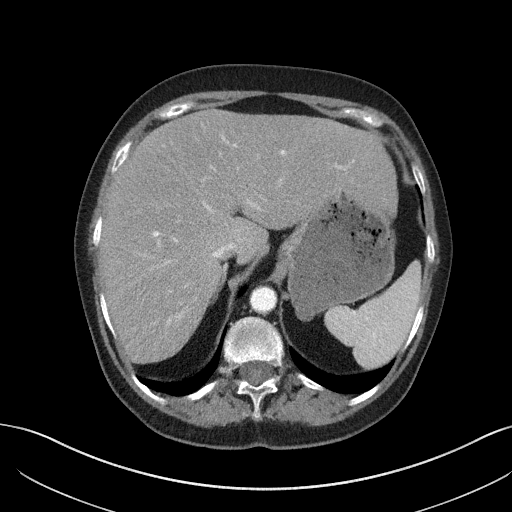
[im 77/91  soft-tissue]
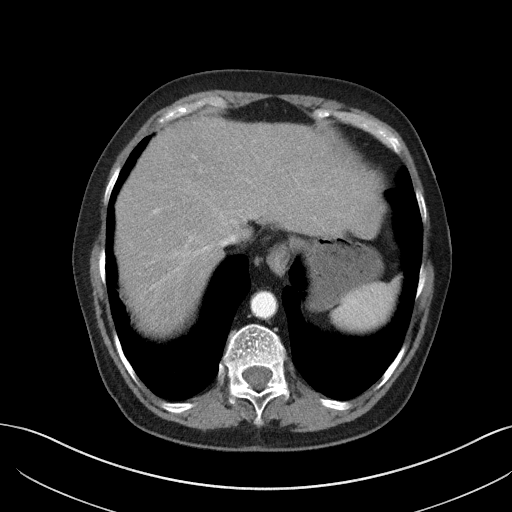
[im 86/91  soft-tissue]
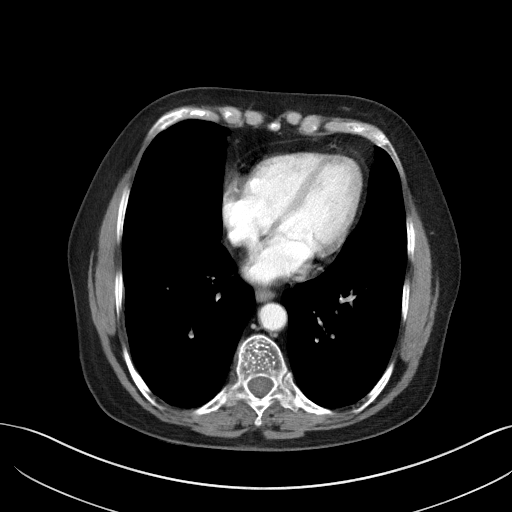

[Series 5: coronal st · coronal · 0.73mm/px · 3 of 95 slices shown]
[im 32/95  soft-tissue]
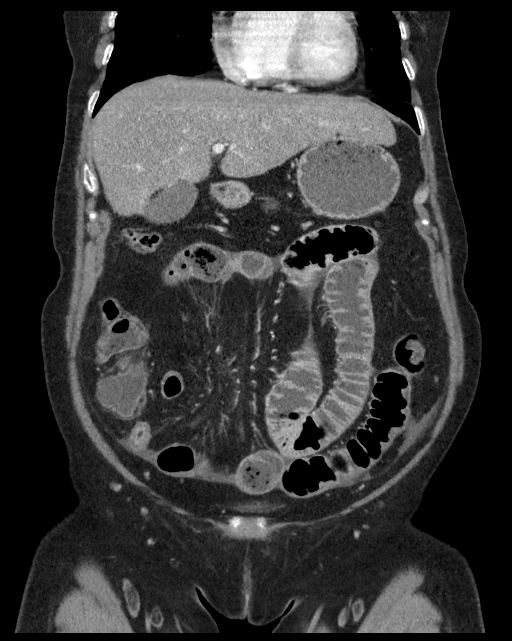
[im 42/95  soft-tissue]
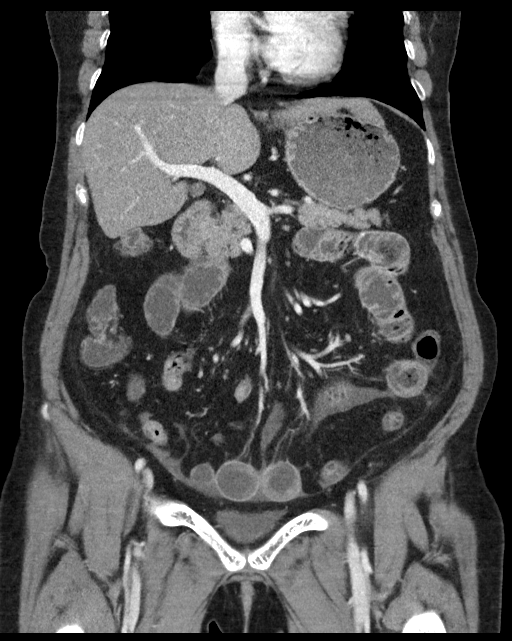
[im 53/95  soft-tissue]
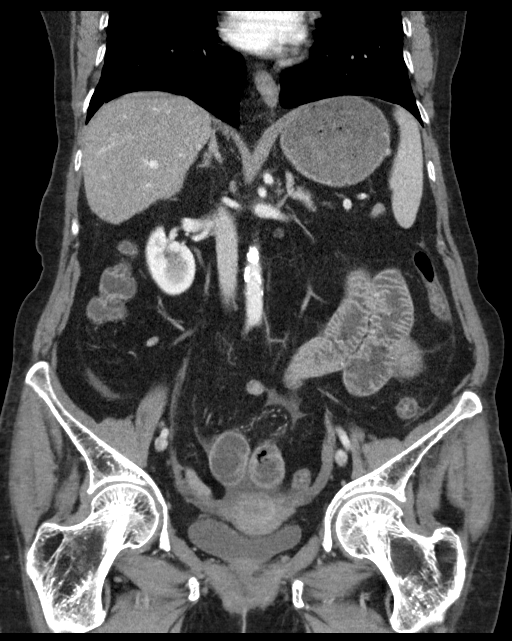

[16 of 46 positions shown; findings below may reference images not displayed]

FINDINGS: Lower chest: Lung bases clear

Hepatobiliary: Fatty infiltration of liver. Gallbladder and liver
otherwise unremarkable.

Pancreas: Normal appearance

Spleen: Normal appearance

Adrenals/Urinary Tract: Nonobstructing 5 mm RIGHT renal calculus
image 36. Adrenal glands, kidneys, ureters, and bladder otherwise
normal appearance.

Stomach/Bowel: Appendix not visualized. Dilated proximal and
decompressed distal small bowel loops compatible with small bowel
obstruction. Transition from dilated to nondilated small bowel
occurs in the upper RIGHT pelvis question due to adhesion. No
evidence of perforation or definite mass lesion. Stomach and colon
unremarkable.

Vascular/Lymphatic: Atherosclerotic calcifications aorta and iliac
arteries without aneurysm.

Reproductive: Uterus and adnexa normal appearance

Other: Low-attenuation free pelvic fluid. No free air. No definite
hernia.

Musculoskeletal: Osseous demineralization diffusely.
IMPRESSION: Distal small bowel obstruction question due to adhesion with
transitional zone noted in the upper RIGHT pelvis.

Associated free pelvic fluid.

Fatty infiltration of liver.

Nonobstructing 5 mm RIGHT renal calculus.

Aortic Atherosclerosis ([id number]-[id number].E).

## 2018-09-16 ENCOUNTER — Ambulatory Visit
Admission: RE | Admit: 2018-09-16 | Discharge: 2018-09-16 | Disposition: A | Discharge status: Home / Self Care | Payer: Medicare Other | Source: Ambulatory Visit | Attending: Oncology | Admitting: Oncology

## 2018-09-16 DIAGNOSIS — Z122 Encounter for screening for malignant neoplasm of respiratory organs: Secondary | ICD-10-CM

## 2018-09-16 DIAGNOSIS — Z87891 Personal history of nicotine dependence: Secondary | ICD-10-CM | POA: Diagnosis not present

## 2018-09-18 ENCOUNTER — Telehealth: Payer: Self-pay | Admitting: *Deleted

## 2018-09-18 NOTE — Telephone Encounter (Signed)
Notified patient of LDCT lung cancer screening program results with recommendation for 12 month follow up imaging. Also notified of incidental findings noted below and is encouraged to discuss further with PCP who will receive a copy of this note and/or the CT report. Patient verbalizes understanding.   IMPRESSION: 1. Lung-RADS 2S, benign appearance or behavior. Continue annual screening with low-dose chest CT without contrast in 12 months. 2. The "S" modifier above refers to potentially clinically significant non lung cancer related findings. Specifically, there is aortic atherosclerosis, in addition to left main and 3 vessel coronary artery disease. Please note that although the presence of coronary artery calcium documents the presence of coronary artery disease, the severity of this disease and any potential stenosis cannot be assessed on this non-gated CT examination. Assessment for potential risk factor modification, dietary therapy or pharmacologic therapy may be warranted, if clinically indicated. 3. Mild diffuse bronchial wall thickening with mild centrilobular and paraseptal emphysema; imaging findings suggestive of underlying COPD. 4. Mild hepatic steatosis.  Aortic Atherosclerosis (ICD10-I70.0) and Emphysema (ICD10-J43.9).

## 2018-09-23 DIAGNOSIS — M810 Age-related osteoporosis without current pathological fracture: Secondary | ICD-10-CM | POA: Diagnosis not present

## 2018-09-28 ENCOUNTER — Other Ambulatory Visit: Payer: Self-pay | Admitting: *Deleted

## 2018-09-28 MED ORDER — EZETIMIBE 10 MG PO TABS: 10.0000 mg | ORAL_TABLET | Freq: Every day | ORAL | 0 refills | Status: DC

## 2018-09-30 DIAGNOSIS — M810 Age-related osteoporosis without current pathological fracture: Secondary | ICD-10-CM | POA: Diagnosis not present

## 2018-12-23 ENCOUNTER — Other Ambulatory Visit: Payer: Self-pay | Admitting: Cardiovascular Disease

## 2018-12-23 NOTE — Telephone Encounter (Signed)
No ans no vm   °

## 2018-12-23 NOTE — Telephone Encounter (Signed)
Please schedule F/U appointment with Dr. Rockey Situ for refills. Thank you!

## 2019-01-01 NOTE — Telephone Encounter (Signed)
Called patient to schedule an appointment.  No answer. LMOV.

## 2019-01-18 ENCOUNTER — Encounter: Payer: Self-pay | Admitting: Cardiovascular Disease

## 2019-01-18 NOTE — Telephone Encounter (Signed)
No ans no vm .  Unable to contact mailed letter closing encounter.

## 2019-02-02 ENCOUNTER — Other Ambulatory Visit: Payer: Self-pay | Admitting: Cardiovascular Disease

## 2019-03-06 ENCOUNTER — Emergency Department
Admission: EM | Admit: 2019-03-06 | Discharge: 2019-03-06 | Disposition: A | Discharge status: Home / Self Care | Payer: Medicare Other | Attending: Emergency Medicine | Admitting: Emergency Medicine

## 2019-03-06 ENCOUNTER — Other Ambulatory Visit: Payer: Self-pay

## 2019-03-06 ENCOUNTER — Emergency Department: Payer: Medicare Other

## 2019-03-06 ENCOUNTER — Encounter: Payer: Self-pay | Admitting: Emergency Medicine

## 2019-03-06 DIAGNOSIS — Z85828 Personal history of other malignant neoplasm of skin: Secondary | ICD-10-CM | POA: Insufficient documentation

## 2019-03-06 DIAGNOSIS — Z7982 Long term (current) use of aspirin: Secondary | ICD-10-CM | POA: Diagnosis not present

## 2019-03-06 DIAGNOSIS — M62830 Muscle spasm of back: Secondary | ICD-10-CM | POA: Diagnosis not present

## 2019-03-06 DIAGNOSIS — J449 Chronic obstructive pulmonary disease, unspecified: Secondary | ICD-10-CM | POA: Insufficient documentation

## 2019-03-06 DIAGNOSIS — I2510 Atherosclerotic heart disease of native coronary artery without angina pectoris: Secondary | ICD-10-CM | POA: Insufficient documentation

## 2019-03-06 DIAGNOSIS — I4891 Unspecified atrial fibrillation: Secondary | ICD-10-CM | POA: Diagnosis not present

## 2019-03-06 DIAGNOSIS — F17228 Nicotine dependence, chewing tobacco, with other nicotine-induced disorders: Secondary | ICD-10-CM | POA: Diagnosis not present

## 2019-03-06 DIAGNOSIS — Z7901 Long term (current) use of anticoagulants: Secondary | ICD-10-CM | POA: Diagnosis not present

## 2019-03-06 DIAGNOSIS — Z79899 Other long term (current) drug therapy: Secondary | ICD-10-CM | POA: Diagnosis not present

## 2019-03-06 DIAGNOSIS — R0602 Shortness of breath: Secondary | ICD-10-CM | POA: Diagnosis not present

## 2019-03-06 DIAGNOSIS — E782 Mixed hyperlipidemia: Secondary | ICD-10-CM

## 2019-03-06 DIAGNOSIS — R002 Palpitations: Secondary | ICD-10-CM | POA: Diagnosis present

## 2019-03-06 DIAGNOSIS — I700 Atherosclerosis of aorta: Secondary | ICD-10-CM

## 2019-03-06 LAB — COMPREHENSIVE METABOLIC PANEL
ALT: 36 U/L (ref 0–44)
AST: 28 U/L (ref 15–41)
Albumin: 3.9 g/dL (ref 3.5–5.0)
Alkaline Phosphatase: 82 U/L (ref 38–126)
Anion gap: 8 (ref 5–15)
BUN: 17 mg/dL (ref 8–23)
CO2: 24 mmol/L (ref 22–32)
Calcium: 8.7 mg/dL — ABNORMAL LOW (ref 8.9–10.3)
Chloride: 108 mmol/L (ref 98–111)
Creatinine, Ser: 1.02 mg/dL — ABNORMAL HIGH (ref 0.44–1.00)
GFR calc Af Amer: >60 mL/min (ref >60)
GFR calc non Af Amer: 55 mL/min — ABNORMAL LOW (ref >60)
Glucose, Bld: 115 mg/dL — ABNORMAL HIGH (ref 70–99)
Potassium: 3.5 mmol/L (ref 3.5–5.1)
Sodium: 140 mmol/L (ref 135–145)
Total Bilirubin: 0.6 mg/dL (ref 0.3–1.2)
Total Protein: 7 g/dL (ref 6.5–8.1)

## 2019-03-06 LAB — TROPONIN I (HIGH SENSITIVITY)
Troponin I (High Sensitivity): 3 ng/L (ref <18)
Troponin I (High Sensitivity): 7 ng/L (ref <18)

## 2019-03-06 LAB — CBC
HCT: 39.5 % (ref 36.0–46.0)
Hemoglobin: 13.1 g/dL (ref 12.0–15.0)
MCH: 29.1 pg (ref 26.0–34.0)
MCHC: 33.2 g/dL (ref 30.0–36.0)
MCV: 87.8 fL (ref 80.0–100.0)
Platelets: 217 10*3/uL (ref 150–400)
RBC: 4.5 MIL/uL (ref 3.87–5.11)
RDW: 12.7 % (ref 11.5–15.5)
WBC: 9.4 10*3/uL (ref 4.0–10.5)
nRBC: 0 % (ref 0.0–0.2)

## 2019-03-06 LAB — TSH: TSH: 5.041 u[IU]/mL — ABNORMAL HIGH (ref 0.350–4.500)

## 2019-03-06 LAB — MAGNESIUM: Magnesium: 2 mg/dL (ref 1.7–2.4)

## 2019-03-06 MED ORDER — EZETIMIBE 10 MG PO TABS: 10.0000 mg | ORAL_TABLET | Freq: Every day | ORAL | 0 refills | Status: DC

## 2019-03-06 MED ORDER — APIXABAN 5 MG PO TABS
5.0000 mg | ORAL_TABLET | Freq: Two times a day (BID) | ORAL | Status: DC
  Administered 2019-03-06: 20:00:00 5 mg via ORAL
  Filled 2019-03-06: qty 1

## 2019-03-06 MED ORDER — METOPROLOL TARTRATE 25 MG PO TABS: 12.5000 mg | ORAL_TABLET | Freq: Two times a day (BID) | ORAL | 0 refills | Status: DC

## 2019-03-06 MED ORDER — APIXABAN 5 MG PO TABS: 5.0000 mg | ORAL_TABLET | Freq: Two times a day (BID) | ORAL | 0 refills | Status: DC

## 2019-03-06 MED ORDER — METOPROLOL TARTRATE 25 MG PO TABS
12.5000 mg | ORAL_TABLET | Freq: Once | ORAL | Status: AC
  Administered 2019-03-06: 20:00:00 12.5 mg via ORAL
  Filled 2019-03-06: qty 1

## 2019-03-06 MED ORDER — ATORVASTATIN CALCIUM 20 MG PO TABS: 10.0000 mg | ORAL_TABLET | Freq: Every day | ORAL | 0 refills | Status: DC

## 2019-03-06 NOTE — Discharge Instructions (Signed)
For now, we are going to start you on TWO new medications: - Metoprolol is a blood pressure medicine that also controls your heart rate. If you notice you are lightheaded or your heart rate is consistently <50 or BP<90/60, stop this and call your doctor - Eliquis is a BLOOD THINNER. This medicine is needed to prevent stroke but has HIGH risks of bleeding, so it's important to read all the information provided. Most importantly if you notice nosebleeding, blood in your stool, or if you have ANY kind of trauma, go to the ER  - Continue your baby aspirin for now

## 2019-03-06 NOTE — ED Notes (Signed)
Patient assisted to the bathroom 

## 2019-03-06 NOTE — ED Provider Notes (Addendum)
[facility] Emergency Department Provider Note  ____________________________________________   First MD Initiated Contact with Patient 03/06/19 1657     (approximate)  I have reviewed the triage vital signs and the nursing notes.   HISTORY  Chief Complaint Atrial Fibrillation    HPI Kristie Walters is a 74 y.o. female  With h/o HLD, PVD, collage vascular disease, COPD, HLD, GERD, CAD, here with palpitations. Pt reports that earlier today, she felt completley fine. She had been sitting down and stood up to move across the room. She then experienced acute onset of sharp, spasm like R paraspinal lower back pain - has had spasms like this before. She was in severe pain. She then began to feel like her heart was racing and beating irregularly, along with mild SOB. She broke out in a sweat. The pain then resolved in her back but the palpitations persisted until EMS arrived. With EMS, she was noted to be in AFib and was given metop 2.5 IV x 1. She cardioverted and now feels better. NO focal numbness, weakness, or neuro sx. No h/o AFib.       Past Medical History:  Diagnosis Date  . Allergy   . Basal cell carcinoma of nose 08/12/2017  . Carotid artery disease ([place])    s/p L. CEA in July 2016  . Centrilobular emphysema ([place]) 09/29/2017  . Collagen vascular disease ([place])    Left carotid stenosis  . COPD (chronic obstructive pulmonary disease) ([place])   . Coronary artery disease 09/29/2017   Noted on chest CT July 2019  . Elevated blood pressure (not hypertension)   . Fatty liver 09/29/2017   Chest CT July 2019  . GERD (gastroesophageal reflux disease)   . Hyperlipidemia   . Personal history of tobacco use, presenting hazards to health 08/31/2015  . PONV (postoperative nausea and vomiting)     Patient Active Problem List   Diagnosis Date Noted  . 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 Fatty liver 09/29/2017  . Coronary artery disease 09/29/2017  .  Centrilobular emphysema ([place]) 09/29/2017  . Basal cell carcinoma of nose 08/12/2017  . Estrogen deficiency 08/01/2017  . Aortic atherosclerosis ([place]) 09/14/2015  . Personal history of tobacco use, presenting hazards to health 08/31/2015  . Paresthesia of foot, bilateral 08/15/2015  . Breast mass, right 07/12/2015  . Elevated alkaline phosphatase level 05/29/2015  . COPD (chronic obstructive pulmonary disease) ([place]) 04/26/2015  . GERD (gastroesophageal reflux disease) 04/26/2015  . Hyperlipidemia 04/26/2015  . Hyperglycemia 04/26/2015  . Carotid stenosis 08/25/2014  . H/O malignant neoplasm of skin 08/13/2012    Past Surgical History:  Procedure Laterality Date  . ENDARTERECTOMY Left 08/25/2014   Procedure: ENDARTERECTOMY CAROTID;  Surgeon: Algernon Huxley, MD;  Location: [facility] ORS;  Service: Vascular;  Laterality: Left;  . MOHS SURGERY    . MOHS SURGERY  09/23/2017   nose  . PERIPHERAL VASCULAR CATHETERIZATION N/A 07/20/2014   Procedure: Carotid Angiography;  Surgeon: Algernon Huxley, MD;  Location: [facility] CV LAB;  Service: Cardiovascular;  Laterality: N/A;  . TONSILLECTOMY    . TUBAL LIGATION      Prior to Admission medications   Medication Sig Start Date End Date Taking? Authorizing Provider  apixaban (ELIQUIS) 5 MG TABS tablet Take 1 tablet (5 mg total) by mouth 2 (two) times daily. 03/06/19 04/05/19  Duffy Bruce, MD  aspirin 81 MG tablet Take 81 mg by mouth daily.    [provider]  atorvastatin (LIPITOR) 20  MG tablet Take 0.5 tablets (10 mg total) by mouth daily. 03/06/19 04/05/19  Duffy Bruce, MD  calcium carbonate (OS-CAL - DOSED IN MG OF ELEMENTAL CALCIUM) 1250 (500 Ca) MG tablet Take 1 tablet by mouth.    [provider]  cholecalciferol (VITAMIN D) 1000 units tablet Take 1,000 Units by mouth daily.    [provider]  ezetimibe (ZETIA) 10 MG tablet Take 1 tablet (10 mg total) by mouth daily. *NEEDS OFFICE VISIT FOR FURTHER REFILLS-2ND REQUEST*  03/06/19 04/05/19  Duffy Bruce, MD  Fish Oil-Cholecalciferol (FISH OIL + D3 PO) Take 1,000 mg by mouth 1 day or 1 dose.    [provider]  fluticasone (FLONASE) 50 MCG/ACT nasal spray Place 2 sprays into both nostrils daily. Patient taking differently: Place 2 sprays into both nostrils as needed.  09/04/16   Hubbard Hartshorn, FNP  metoprolol tartrate (LOPRESSOR) 25 MG tablet Take 0.5 tablets (12.5 mg total) by mouth 2 (two) times daily. 03/06/19 04/05/19  Duffy Bruce, MD  omeprazole (PRILOSEC) 40 MG capsule Take 40 mg by mouth as needed.     [provider]  polyethylene glycol powder (GLYCOLAX/MIRALAX) 17 GM/SCOOP powder Take 17 g by mouth 2 (two) times daily as needed. 07/31/18   Hubbard Hartshorn, FNP    Allergies Morphine and Morphine and related  Family History  Problem Relation Age of Onset  . Heart attack Mother   . Hypercholesterolemia Mother   . Hypertension Mother   . Peripheral vascular disease Mother   . Dementia Mother   . Hypothyroidism Mother   . CVA Father   . Liver cancer Father   . Diabetes Brother   . Kidney cancer Sister   . Diabetes Brother   . Alzheimer's disease Brother   . Other Brother        alzheimers  . Lymphoma Son   . Breast cancer Neg Hx     Social History Social History   Tobacco Use  . Smoking status: Former Smoker    Packs/day: 1.00    Years: 55.00    Pack years: 55.00    Types: Cigarettes    Quit date: 11/26/2016    Years since quitting: 2.2  . Smokeless tobacco: Current User    Types: Snuff  . Tobacco comment: smoking cessation materials not required  Substance Use Topics  . Alcohol use: No  . Drug use: No    Review of Systems  Review of Systems  Constitutional: Positive for diaphoresis and fatigue. Negative for fever.  HENT: Negative for congestion and sore throat.   Eyes: Negative for visual disturbance.  Respiratory: Positive for chest tightness and shortness of breath. Negative for cough.   Cardiovascular:  Negative for chest pain.  Gastrointestinal: Negative for abdominal pain, diarrhea, nausea and vomiting.  Genitourinary: Negative for flank pain.  Musculoskeletal: Positive for arthralgias and back pain. Negative for neck pain.  Skin: Negative for rash and wound.  Neurological: Positive for weakness.  All other systems reviewed and are negative.    ____________________________________________  PHYSICAL EXAM:      VITAL SIGNS: ED Triage Vitals  Enc Vitals Group     BP --      Pulse Rate 03/06/19 1700 88     Resp 03/06/19 1700 16     Temp --      Temp Source 03/06/19 1700 Oral     SpO2 03/06/19 1700 98 %     Weight 03/06/19 1657 154 lb (69.9 kg)  Height 03/06/19 1657 5\' 10"  (1.778 m)     Head Circumference --      Peak Flow --      Pain Score 03/06/19 1657 0     Pain Loc --      Pain Edu? --      Excl. in [place]? --      Physical Exam Vitals and nursing note reviewed.  Constitutional:      General: She is not in acute distress.    Appearance: She is well-developed.  HENT:     Head: Normocephalic and atraumatic.  Eyes:     Conjunctiva/sclera: Conjunctivae normal.  Cardiovascular:     Rate and Rhythm: Normal rate and regular rhythm.     Heart sounds: Normal heart sounds. No murmur. No friction rub.     Comments: No appreciable murmur. Pulses 2+ and symmetric b/l UE and LE. Pulmonary:     Effort: Pulmonary effort is normal. No respiratory distress.     Breath sounds: Normal breath sounds. No wheezing or rales.  Abdominal:     General: There is no distension.     Palpations: Abdomen is soft.     Tenderness: There is no abdominal tenderness.  Musculoskeletal:     Cervical back: Neck supple.  Skin:    General: Skin is warm.     Capillary Refill: Capillary refill takes less than 2 seconds.  Neurological:     General: No focal deficit present.     Mental Status: She is alert and oriented to person, place, and time.     Motor: No abnormal muscle tone.        ____________________________________________   LABS (all labs ordered are listed, but only abnormal results are displayed)  Labs Reviewed  COMPREHENSIVE METABOLIC PANEL - Abnormal; Notable for the following components:      Result Value   Glucose, Bld 115 (*)    Creatinine, Ser 1.02 (*)    Calcium 8.7 (*)    GFR calc non Af Amer 55 (*)    All other components within normal limits  TSH - Abnormal; Notable for the following components:   TSH 5.041 (*)    All other components within normal limits  CBC  MAGNESIUM  TROPONIN I (HIGH SENSITIVITY)  TROPONIN I (HIGH SENSITIVITY)    ____________________________________________  EKG: Normal sinus rhythm, VR 87. PR 203, QRS 135, QTc 499. LBBB noted. No Sgarbossa criteria. ________________________________________  RADIOLOGY All imaging, including plain films, CT scans, and ultrasounds, independently reviewed by me, and interpretations confirmed via formal radiology reads.  ED MD interpretation:   CXR: Clear, no cardiomegaly or CHF  Official radiology report(s): DG Chest 2 View  Result Date: 03/06/2019 CLINICAL DATA:  Chest pain EXAM: CHEST - 2 VIEW COMPARISON:  CT 09/16/2018 FINDINGS: There is hyperinflation of the lungs with flattening of the diaphragms and some chronically coarse interstitial changes, similar to comparison CT imaging. Biapical pleuroparenchymal scarring is unchanged as well. No focal consolidative opacity or convincing features of edema. No pneumothorax. No effusion. The cardiomediastinal contours are unremarkable. No acute osseous or soft tissue abnormality. Degenerative changes are present in the imaged spine and shoulders. IMPRESSION: No acute cardiopulmonary abnormality. Chronic examined emphysematous findings as described above. Electronically Signed   By: Lovena Le M.D.   On: 03/06/2019 17:47    ____________________________________________  PROCEDURES   Procedure(s) performed (including Critical  Care):  Procedures  ____________________________________________  INITIAL IMPRESSION / MDM / [place] / ED COURSE  As part  of my medical decision making, I reviewed the following data within the [place] notes reviewed and incorporated, Old chart reviewed, Notes from prior ED visits, and [place] Controlled Substance Database       *ZYANNE FACKELMAN was evaluated in Emergency Department on 03/06/2019 for the symptoms described in the history of present illness. She was evaluated in the context of the global COVID-19 pandemic, which necessitated consideration that the patient might be at risk for infection with the SARS-CoV-2 virus that causes COVID-19. Institutional protocols and algorithms that pertain to the evaluation of patients at risk for COVID-19 are in a state of rapid change based on information released by regulatory bodies including the CDC and federal and state organizations. These policies and algorithms were followed during the patient's care in the ED.  Some ED evaluations and interventions may be delayed as a result of limited staffing during the pandemic.*      [id number]-[id number] Score for Atrial Fibrillation    Patient Score  Age <65 = 0 65-74 = 1 > 75 = 2 1  Sex Female = 0 Female = 1 1  CHF History No = 0  Yes = 1 0  HTN History No = 0  Yes = 1 1  Stroke/TIA/TE History No = 0  Yes = 1 0  Vascular Disease History No = 0  Yes = 1 1  Diabetes History No = 0  Yes = 1 0  Total:  4     Medical Decision Making:  74 yo F here with new onset, transient AFib now resolved s/p IV Metop x 1. No ongoing CP, SOB, or other sx. EKG shows lBBB which is new but no Sgarbossa criteria. Labs reviewed and reassuring - normal lytes, no significant anemia. Trop neg x 2, and CP resolved - low concern for ACS contributing to her AFib or new LBBB and I suspect her transient CP was demand/rate-related.  Discussed case with Cardiology Dr. Garen Lah. Given neg trop  and reassuring history/exam, will d/c on Metop and Eliquis. Recommended 25 BID but given HR 70s in ED after cardioversion, will be cautious w/ 12.5. Will have her call Dr. Rockey Situ for f/u in 2-3 days.  ADDENDUM: Discussed anticoagulation with Vascular Dr. Teola Bradley. He recommends continuing ASA 81 mg daily in addition to Eliquis now. Will refer to Cards and PCP. ____________________________________________  FINAL CLINICAL IMPRESSION(S) / ED DIAGNOSES  Final diagnoses:  New onset atrial fibrillation ([place])     MEDICATIONS GIVEN DURING THIS VISIT:  Medications  apixaban (ELIQUIS) tablet 5 mg (5 mg Oral Given 03/06/19 2006)  metoprolol tartrate (LOPRESSOR) tablet 12.5 mg (12.5 mg Oral Given 03/06/19 2006)     ED Discharge Orders         Ordered    metoprolol tartrate (LOPRESSOR) 25 MG tablet  2 times daily     03/06/19 2027    apixaban (ELIQUIS) 5 MG TABS tablet  2 times daily     03/06/19 2027    atorvastatin (LIPITOR) 20 MG tablet  Daily     03/06/19 2027    ezetimibe (ZETIA) 10 MG tablet  Daily     03/06/19 2027           Note:  This document was prepared using Dragon voice recognition software and may include unintentional dictation errors.   Duffy Bruce, MD 03/06/19 2127    Duffy Bruce, MD 03/06/19 2128

## 2019-03-06 NOTE — ED Notes (Signed)
Daughter Tandy Zoto called and given an update [phone].

## 2019-03-06 NOTE — ED Notes (Signed)
ED Provider at bedside. 

## 2019-03-06 NOTE — ED Triage Notes (Signed)
Pt presents to ED via [facility] from home c/o new-onset A-fib rate 90-160 with EMS. Pt given 269mL NS and 2.5mg  Metoprolol IV PTA. Pt denies chest pain or SOB. On aspirin.

## 2019-03-08 NOTE — Progress Notes (Signed)
Cardiology Office Note    Date:  03/09/2019   ID:  Kristie Walters, DOB 1945/07/23, MRN [id number]  PCP:  Arnetha Courser, MD  Cardiologist:  Ida Rogue, MD  Electrophysiologist:  None   Chief Complaint: ED follow-up  History of Present Illness:   Kristie Walters is a 74 y.o. 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  Patient was previously followed by Dr. Clayborn Bigness, subsequently transitioned to Dr. Rockey Situ in 09/2017.  Prior echo from 09/2015 done by outside cardiology group showed an EF greater than 55%, mild LVH, normal RV systolic function, mild MR/TR.  Nuclear stress test at that time showed an EF of 77% with no evidence of significant ischemia or scar.  She was referred to [facility] in 09/2017 for evaluation of incidentally noted aortic atherosclerosis and coronary artery calcium along the LAD on noninvasive imaging.  She was noted to not be very active at baseline secondary to back pain.  She did note some rare palpitations associated with stress that improved with rest.  Aggressive primary prevention was recommended.  She declined stress testing at that time.  She was seen in the ED on 03/06/2019 with palpitations earlier in the day as well as a sharp, spasm-like right paraspinal lower back pain.  ED note indicates she was documented to be in A. fib in the field with conversion to sinus rhythm following 2.5 mg of IV metoprolol.  Review of EMS EKG demonstrates A. fib with RVR with LBBB and heart rates in the 130s bpm.  Available EKGs for review in Epic  demonstrates sinus rhythm with LBBB, which is new for her.  Chest x-ray showed no acute cardiopulmonary abnormality with chronic emphysematous changes noted.  High-sensitivity troponin of 3 with a delta of 7, TSH elevated at 5.041, potassium 3.5.  She was discharged on metoprolol and Eliquis, along with continuation of aspirin per vascular, with recommendation to follow-up as an outpatient.  She comes in doing well today.  Since she was seen in the ED she has noted a couple brief episodes of tachypalpitations, though nothing sustained.  She denies any chest pain or shortness of breath.  She is tolerating Eliquis and metoprolol without issue.  No falls, BRBPR, or melena.  No lower extremity swelling, abdominal distention, orthopnea, PND, or early satiety.  Weight has remained stable.  She tells me she has a long history of right flank muscle cramping with radiation down the right arm.  Leading up to her above ED presentation she had a severe episode of right flank cramping with radiation down the right arm with associated nausea, vomiting, and diaphoresis which prompted her neighbor to contact EMS as outlined above.  Since then, she has not had any further episodes.   Labs independently reviewed: 02/2019 - potassium 3.5, BUN 17, serum creatinine 1.02, albumin 3.9, AST/ALT normal, magnesium 2.0, TSH 5.041, Hgb 13.1, PLT 217 07/2018 - TC 145, TG 131, HDL 36, LDL 84  Past Medical History:  Diagnosis Date  . Allergy   . Basal cell carcinoma of nose 08/12/2017  . Carotid artery disease ([place])  s/p L. CEA in July 2016  . Centrilobular emphysema ([place]) 09/29/2017  . Collagen vascular disease ([place])    Left carotid stenosis  . COPD (chronic obstructive pulmonary disease) ([place])   . Coronary artery disease 09/29/2017   Noted on chest CT July 2019  . Elevated blood pressure (not hypertension)   . Fatty liver 09/29/2017   Chest CT July 2019  . GERD (gastroesophageal reflux disease)   . Hyperlipidemia   .  Personal history of tobacco use, presenting hazards to health 08/31/2015  . PONV (postoperative nausea and vomiting)     Past Surgical History:  Procedure Laterality Date  . ENDARTERECTOMY Left 08/25/2014   Procedure: ENDARTERECTOMY CAROTID;  Surgeon: Algernon Huxley, MD;  Location: [facility] ORS;  Service: Vascular;  Laterality: Left;  . MOHS SURGERY    . MOHS SURGERY  09/23/2017   nose  . PERIPHERAL VASCULAR CATHETERIZATION N/A 07/20/2014   Procedure: Carotid Angiography;  Surgeon: Algernon Huxley, MD;  Location: [facility];  Service: Cardiovascular;  Laterality: N/A;  . TONSILLECTOMY    . TUBAL LIGATION      Current Medications: Current Meds  Medication Sig  . apixaban (ELIQUIS) 5 MG TABS tablet Take 1 tablet (5 mg total) by mouth 2 (two) times daily.  [name] atorvastatin (LIPITOR) 20 MG tablet Take 0.5 tablets (10 mg total) by mouth daily.  . calcium carbonate (OS-CAL - DOSED IN MG OF ELEMENTAL CALCIUM) 1250 (500 Ca) MG tablet Take 1 tablet by mouth.  . cholecalciferol (VITAMIN D) 1000 units tablet Take 1,000 Units by mouth daily.  [name] ezetimibe (ZETIA) 10 MG tablet Take 1 tablet (10 mg total) by mouth daily. *NEEDS OFFICE VISIT FOR FURTHER REFILLS-2ND REQUEST*  . Fish Oil-Cholecalciferol (FISH OIL + D3 PO) Take 1,000 mg by mouth 1 day or 1 dose.  . metoprolol tartrate (LOPRESSOR) 25 MG tablet Take 0.5 tablets (12.5 mg total) by mouth 2 (two) times daily.  [name] omeprazole (PRILOSEC) 40 MG capsule Take 40 mg by mouth as needed.     Allergies:   Morphine and Morphine and related   Social History   Socioeconomic History  . Marital status: Divorced    Spouse name: Not on file  . Number of children: 2  . Years of education: Not on file  . Highest education level: 10th grade  Occupational History  . Occupation: Retired  Tobacco Use  . Smoking status: Former Smoker    Packs/day: 1.00    Years: 55.00    Pack years: 55.00    Types: Cigarettes    Quit date: 11/26/2016    Years since quitting:  2.2  . Smokeless tobacco: Current User    Types: Snuff  . Tobacco comment: smoking cessation materials not required  Substance and Sexual Activity  . Alcohol use: No  . Drug use: No  . Sexual activity: Not Currently  Other Topics Concern  . Not on file  Social History Narrative  . Not on file   Social Determinants of Health   Financial Resource Strain:   . Difficulty of Paying Living Expenses: Not on file  Food Insecurity:   . Worried About Charity fundraiser in the Last Year: Not on file  . Ran Out of Food in the Last Year: Not on file  Transportation Needs:   . Lack of Transportation (Medical): Not on file  . Lack of Transportation (Non-Medical): Not on file  Physical Activity:   . Days of Exercise per Week: Not  on file  . Minutes of Exercise per Session: Not on file  Stress:   . Feeling of Stress : Not on file  Social Connections: Unknown  . Frequency of Communication with Friends and Family: More than three times a week  . Frequency of Social Gatherings with Friends and Family: Three times a week  . Attends Religious Services: More than 4 times per year  . Active Member of Clubs or Organizations: No  . Attends Archivist Meetings: Never  . Marital Status: Not on file     Family History:  The patient's family history includes Alzheimer's disease in her brother; CVA in her father; Dementia in her mother; Diabetes in her brother and brother; Heart attack in her mother; Hypercholesterolemia in her mother; Hypertension in her mother; Hypothyroidism in her mother; Kidney cancer in her sister; Liver cancer in her father; Lymphoma in her son; Other in her brother; Peripheral vascular disease in her mother. There is no history of Breast cancer.  ROS:   Review of Systems  Constitutional: Positive for malaise/fatigue. Negative for chills, diaphoresis, fever and weight loss.  HENT: Negative for congestion.   Eyes: Negative for discharge and redness.  Respiratory:  Negative for cough, hemoptysis, sputum production, shortness of breath and wheezing.   Cardiovascular: Positive for palpitations. Negative for chest pain, orthopnea, claudication, leg swelling and PND.  Gastrointestinal: Negative for abdominal pain, blood in stool, heartburn, melena, nausea and vomiting.  Genitourinary: Negative for hematuria.  Musculoskeletal: Positive for back pain and myalgias. Negative for falls.  Skin: Negative for rash.  Neurological: Positive for weakness. Negative for dizziness, tingling, tremors, sensory change, speech change, focal weakness and loss of consciousness.  Endo/Heme/Allergies: Does not bruise/bleed easily.  Psychiatric/Behavioral: Negative for substance abuse. The patient is not nervous/anxious.   All other systems reviewed and are negative.    EKGs/Labs/Other Studies Reviewed:    Studies reviewed were summarized above. The additional studies were reviewed today:  Nuclear stress test 09/2015: LVEF= 77 %  FINDINGS: Regional wall motion:reveals normal myocardial thickening and wall  motion. The overall quality of the study is good. Artifacts noted: no Left ventricular cavity: normal.  Perfusion Analysis:SPECT images demonstrate homogeneous tracer  distribution throughout the myocardium. __________  2D echo 09/2015: INTERPRETATION NORMAL LEFT VENTRICULAR SYSTOLIC FUNCTION WITH MILD LVH NORMAL RIGHT VENTRICULAR SYSTOLIC FUNCTION MILD VALVULAR REGURGITATION (See above) NO VALVULAR STENOSIS EF >55%   EKG:  EKG is ordered today.  The EKG ordered today demonstrates NSR, 73 bpm, LBBB  Recent Labs: 03/06/2019: ALT 36; BUN 17; Creatinine, Ser 1.02; Hemoglobin 13.1; Magnesium 2.0; Platelets 217; Potassium 3.5; Sodium 140; TSH 5.041  Recent Lipid Panel    Component Value Date/Time   CHOL 145 08/06/2018 0909   CHOL 155 05/08/2015 0927   TRIG 151 (H) 08/06/2018 0909   HDL 36 (L) 08/06/2018 0909   HDL 30 (L) 05/08/2015 0927   CHOLHDL  4.0 08/06/2018 0909   LDLCALC 84 08/06/2018 0909    PHYSICAL EXAM:    VS:  BP (!) 146/68 (BP Location: Left Arm, Patient Position: Sitting, Cuff Size: Normal)   Pulse 73   Ht 5\' 10"  (1.778 m)   Wt 159 lb 8 oz (72.3 kg)   SpO2 97%   BMI 22.89 kg/m   BMI: Body mass index is 22.89 kg/m.  Physical Exam  Constitutional: She is oriented to person, place, and time. She appears well-developed and well-nourished.  HENT:  Head: Normocephalic and atraumatic.  Eyes: Right eye exhibits no  discharge. Left eye exhibits no discharge.  Neck: No JVD present.  Cardiovascular: Normal rate, regular rhythm, S1 normal, S2 normal and normal heart sounds. Exam reveals no distant heart sounds, no friction rub, no midsystolic click and no opening snap.  No murmur heard. Pulses:      Posterior tibial pulses are 2+ on the right side and 2+ on the left side.  Pulmonary/Chest: Effort normal and breath sounds normal. No respiratory distress. She has no decreased breath sounds. She has no wheezes. She has no rales. She exhibits no tenderness.  Abdominal: Soft. She exhibits no distension. There is no abdominal tenderness.  Musculoskeletal:        General: No edema.     Cervical back: Normal range of motion.  Neurological: She is alert and oriented to person, place, and time.  Skin: Skin is warm and dry. No cyanosis. Nails show no clubbing.  Psychiatric: She has a normal mood and affect. Her speech is normal and behavior is normal. Judgment and thought content normal.    Wt Readings from Last 3 Encounters:  03/09/19 159 lb 8 oz (72.3 kg)  03/06/19 154 lb (69.9 kg)  09/16/18 157 lb (71.2 kg)     ASSESSMENT & PLAN:   1. New onset A. Fib: Maintaining sinus rhythm.  She does note an occasional brief episode of tachypalpitations since she was seen in the ED.  Discussion with patient regarding management of A. fib as well as potential complications including CVA.  Given her CHADS2VASc of at least 67 (age x1,  vascular disease, sex category) she will be continued on Eliquis 5 mg twice daily.  She does not meet reduced dosing criteria.  She is tolerating this without any symptoms concerning for bleeding.  Check BMP and CBC today.  Titrate Lopressor to 25 mg twice daily given ongoing tachypalpitations.  In follow-up, if she continues to note tachypalpitations despite escalation of rate control therapy consider Zio patch to evaluate for breakthrough arrhythmia.  Consider outpatient sleep study in follow-up.  2. New LBBB: High-sensitivity troponin negative x2 in the ED.  Less likely rate related given LBBB persisted following conversion to sinus rhythm with controlled heart rates with continued LBBB today in sinus rhythm.  Start with echo and based on these results we will proceed with ischemic evaluation.  3. Coronary artery calcium: Incidentally noted on noninvasive imaging.  Prior nuclear stress test without significant ischemia in 2017.  Continue atorvastatin as outlined below.  We will plan for ischemic evaluation based on echo.  4. HLD: Most recent LDL of 84 from 07/2018.  Remains on atorvastatin 20 mg daily along with Zetia 10 mg daily.  Followed by PCP.  5. Carotid artery disease: Status post left-sided CEA.  Stable disease noted on most recent ultrasound.  Recommend she resume aspirin at the direction of vascular surgery as noted during her recent ED visit.  Follow-up with vascular surgery as directed.  Disposition: F/u with Dr. Rockey Situ or an APP in 1 month.   Medication Adjustments/Labs and Tests Ordered: Current medicines are reviewed at length with the patient today.  Concerns regarding medicines are outlined above. Medication changes, Labs and Tests ordered today are summarized above and listed in the Patient Instructions accessible in Encounters.   Signed, Christell Faith, PA-C 03/09/2019 10:40 AM     [facility] - [address] [place], [place] [address] [phone]

## 2019-03-09 ENCOUNTER — Ambulatory Visit: Payer: Medicare Other | Admitting: Physician Assistant

## 2019-03-09 ENCOUNTER — Other Ambulatory Visit: Payer: Self-pay

## 2019-03-09 ENCOUNTER — Encounter: Payer: Self-pay | Admitting: Physician Assistant

## 2019-03-09 VITALS — BP 146/68 | HR 73 | Ht 70.0 in | Wt 159.5 lb

## 2019-03-09 DIAGNOSIS — E785 Hyperlipidemia, unspecified: Secondary | ICD-10-CM

## 2019-03-09 DIAGNOSIS — I2510 Atherosclerotic heart disease of native coronary artery without angina pectoris: Secondary | ICD-10-CM

## 2019-03-09 DIAGNOSIS — I447 Left bundle-branch block, unspecified: Secondary | ICD-10-CM | POA: Diagnosis not present

## 2019-03-09 DIAGNOSIS — I4891 Unspecified atrial fibrillation: Secondary | ICD-10-CM | POA: Diagnosis not present

## 2019-03-09 DIAGNOSIS — E782 Mixed hyperlipidemia: Secondary | ICD-10-CM | POA: Diagnosis not present

## 2019-03-09 DIAGNOSIS — I700 Atherosclerosis of aorta: Secondary | ICD-10-CM

## 2019-03-09 DIAGNOSIS — I6523 Occlusion and stenosis of bilateral carotid arteries: Secondary | ICD-10-CM

## 2019-03-09 MED ORDER — METOPROLOL TARTRATE 25 MG PO TABS: 25.0000 mg | ORAL_TABLET | Freq: Two times a day (BID) | ORAL | 2 refills | Status: DC

## 2019-03-09 MED ORDER — EZETIMIBE 10 MG PO TABS: 10.0000 mg | ORAL_TABLET | Freq: Every day | ORAL | 2 refills | Status: DC

## 2019-03-09 MED ORDER — APIXABAN 5 MG PO TABS: 5.0000 mg | ORAL_TABLET | Freq: Two times a day (BID) | ORAL | 2 refills | Status: DC

## 2019-03-09 MED ORDER — ATORVASTATIN CALCIUM 20 MG PO TABS: 10.0000 mg | ORAL_TABLET | Freq: Every day | ORAL | 2 refills | Status: DC

## 2019-03-09 NOTE — Patient Instructions (Signed)
Medication Instructions:  Your physician has recommended you make the following change in your medication:  1- INCREASE Lopressor to 25 mg by mouth two times a day.  *If you need a refill on your cardiac medications before your next appointment, please call your pharmacy*  Lab Work: Your physician recommends that you return for lab work in: [place], CBC.  If you have labs (blood work) drawn today and your tests are completely normal, you will receive your results only by: [name] MyChart Message (if you have MyChart) OR . A paper copy in the mail If you have any lab test that is abnormal or we need to change your treatment, we will call you to review the results.  Testing/Procedures: Your physician has requested that you have an echocardiogram. Echocardiography is a painless test that uses sound waves to create images of your heart. It provides your doctor with information about the size and shape of your heart and how well your heart's chambers and valves are working. This procedure takes approximately one hour. There are no restrictions for this procedure. You may get an IV, if needed, to receive an ultrasound enhancing agent through to better visualize your heart.   Follow-Up: At [facility], you and your health needs are our priority.  As part of our continuing mission to provide you with exceptional heart care, we have created designated Provider Care Teams.  These Care Teams include your primary Cardiologist (physician) and Advanced Practice Providers (APPs -  Physician Assistants and Nurse Practitioners) who all work together to provide you with the care you need, when you need it.  Your next appointment:   1 month(s)  The format for your next appointment:   In Person  Provider:    You may see Ida Rogue, MD or one of the following Advanced Practice Providers on your designated Care Team:    Murray Hodgkins, NP  Christell Faith, PA-C  Marrianne Mood,  PA-C   Echocardiogram An echocardiogram is a procedure that uses painless sound waves (ultrasound) to produce an image of the heart. Images from an echocardiogram can provide important information about:  Signs of coronary artery disease (CAD).  Aneurysm detection. An aneurysm is a weak or damaged part of an artery wall that bulges out from the normal force of blood pumping through the body.  Heart size and shape. Changes in the size or shape of the heart can be associated with certain conditions, including heart failure, aneurysm, and CAD.  Heart muscle function.  Heart valve function.  Signs of a past heart attack.  Fluid buildup around the heart.  Thickening of the heart muscle.  A tumor or infectious growth around the heart valves. Tell a health care provider about:  Any allergies you have.  All medicines you are taking, including vitamins, herbs, eye drops, creams, and over-the-counter medicines.  Any blood disorders you have.  Any surgeries you have had.  Any medical conditions you have.  Whether you are pregnant or may be pregnant. What are the risks? Generally, this is a safe procedure. However, problems may occur, including:  Allergic reaction to dye (contrast) that may be used during the procedure. What happens before the procedure? No specific preparation is needed. You may eat and drink normally. What happens during the procedure?   An IV tube may be inserted into one of your veins.  You may receive contrast through this tube. A contrast is an injection that improves the quality of the pictures from  your heart.  A gel will be applied to your chest.  A wand-like tool (transducer) will be moved over your chest. The gel will help to transmit the sound waves from the transducer.  The sound waves will harmlessly bounce off of your heart to allow the heart images to be captured in real-time motion. The images will be recorded on a computer. The procedure  may vary among health care providers and hospitals. What happens after the procedure?  You may return to your normal, everyday life, including diet, activities, and medicines, unless your health care provider tells you not to do that. Summary  An echocardiogram is a procedure that uses painless sound waves (ultrasound) to produce an image of the heart.  Images from an echocardiogram can provide important information about the size and shape of your heart, heart muscle function, heart valve function, and fluid buildup around your heart.  You do not need to do anything to prepare before this procedure. You may eat and drink normally.  After the echocardiogram is completed, you may return to your normal, everyday life, unless your health care provider tells you not to do that. This information is not intended to replace advice given to you by your health care provider. Make sure you discuss any questions you have with your health care provider. Document Revised: 05/28/2018 Document Reviewed: 03/09/2016 Elsevier Patient Education  [place].

## 2019-03-10 ENCOUNTER — Telehealth: Payer: Self-pay

## 2019-03-10 LAB — CBC WITH DIFFERENTIAL/PLATELET
Basophils Absolute: 0.1 10*3/uL (ref 0.0–0.2)
Basos: 1 %
EOS (ABSOLUTE): 0.1 10*3/uL (ref 0.0–0.4)
Eos: 1 %
Hematocrit: 40.3 % (ref 34.0–46.6)
Hemoglobin: 13.4 g/dL (ref 11.1–15.9)
Immature Grans (Abs): 0 10*3/uL (ref 0.0–0.1)
Immature Granulocytes: 0 %
Lymphocytes Absolute: 1.9 10*3/uL (ref 0.7–3.1)
Lymphs: 22 %
MCH: 29.3 pg (ref 26.6–33.0)
MCHC: 33.3 g/dL (ref 31.5–35.7)
MCV: 88 fL (ref 79–97)
Monocytes Absolute: 0.5 10*3/uL (ref 0.1–0.9)
Monocytes: 6 %
Neutrophils Absolute: 6.3 10*3/uL (ref 1.4–7.0)
Neutrophils: 70 %
Platelets: 217 10*3/uL (ref 150–450)
RBC: 4.57 x10E6/uL (ref 3.77–5.28)
RDW: 12.7 % (ref 11.7–15.4)
WBC: 9 10*3/uL (ref 3.4–10.8)

## 2019-03-10 LAB — BASIC METABOLIC PANEL
BUN/Creatinine Ratio: 20 (ref 12–28)
BUN: 14 mg/dL (ref 8–27)
CO2: 23 mmol/L (ref 20–29)
Calcium: 9.2 mg/dL (ref 8.7–10.3)
Chloride: 106 mmol/L (ref 96–106)
Creatinine, Ser: 0.7 mg/dL (ref 0.57–1.00)
GFR calc Af Amer: 99 mL/min/{1.73_m2} (ref >59)
GFR calc non Af Amer: 86 mL/min/{1.73_m2} (ref >59)
Glucose: 91 mg/dL (ref 65–99)
Potassium: 4.1 mmol/L (ref 3.5–5.2)
Sodium: 141 mmol/L (ref 134–144)

## 2019-03-10 NOTE — Telephone Encounter (Signed)
-----   Message from Rise Mu, PA-C sent at 03/10/2019  7:11 AM EST ----- Labs are okay on anticoagulation.  Continue with recommendations discussed at office visit.

## 2019-03-10 NOTE — Telephone Encounter (Signed)
Call to patient to discuss lab results. No further questions or orders at this time.   Advised pt to call for any further questions or concerns.

## 2019-03-11 ENCOUNTER — Encounter: Payer: Self-pay | Admitting: Family Medicine

## 2019-03-11 ENCOUNTER — Other Ambulatory Visit: Payer: Self-pay

## 2019-03-11 ENCOUNTER — Ambulatory Visit (INDEPENDENT_AMBULATORY_CARE_PROVIDER_SITE_OTHER): Payer: Medicare Other | Admitting: Family Medicine

## 2019-03-11 VITALS — BP 120/80 | HR 77 | Temp 97.1°F | Resp 16 | Ht 70.0 in | Wt 158.1 lb

## 2019-03-11 DIAGNOSIS — I6523 Occlusion and stenosis of bilateral carotid arteries: Secondary | ICD-10-CM

## 2019-03-11 DIAGNOSIS — M810 Age-related osteoporosis without current pathological fracture: Secondary | ICD-10-CM

## 2019-03-11 DIAGNOSIS — G629 Polyneuropathy, unspecified: Secondary | ICD-10-CM

## 2019-03-11 DIAGNOSIS — N6452 Nipple discharge: Secondary | ICD-10-CM

## 2019-03-11 DIAGNOSIS — I480 Paroxysmal atrial fibrillation: Secondary | ICD-10-CM

## 2019-03-11 DIAGNOSIS — R7989 Other specified abnormal findings of blood chemistry: Secondary | ICD-10-CM

## 2019-03-11 DIAGNOSIS — J432 Centrilobular emphysema: Secondary | ICD-10-CM

## 2019-03-11 DIAGNOSIS — I700 Atherosclerosis of aorta: Secondary | ICD-10-CM

## 2019-03-11 DIAGNOSIS — R109 Unspecified abdominal pain: Secondary | ICD-10-CM

## 2019-03-11 MED ORDER — PREGABALIN 50 MG PO CAPS: 50.0000 mg | ORAL_CAPSULE | Freq: Three times a day (TID) | ORAL | 0 refills | Status: DC

## 2019-03-11 NOTE — Progress Notes (Signed)
Name: Kristie Walters   MRN: [id number]    DOB: [birth year]   Date:03/11/2019       Progress Note  Subjective  Chief Complaint  Chief Complaint  Patient presents with  . Hospitalization Follow-up  . Atrial Fibrillation    New onset 03/06/2019-seen Christell Faith, PA  . Breast Problem    Has leakage out of her left breast for years-needs a mammogram  . Dysuria    Onset-a couple of weeks, After finishing urinating it stings, a little odor    HPI  ED follow up she developed flank pain with nausea, vomiting and diaphoresis on 03/06/2019 and called EMS, she also had some palpitation. She was found to be in Afib, rate of 130. She was given IV Metoprolol and symptoms improved, she was sent home on aspirin, Eliquis nad metoprolol. She saw  Christell Faith ( PA at [facility]) on 03/09/2019. She has been compliant with medication and denies side effects. No chest pain or any symptoms since discharge. She has an Echo schedule  Aortic atherosclerosis andCAD/Carotid Stenosis ED follow up: takes zetia, fish oils and atorvastatin, daily aspirin, and now also on Eliquis secondary to new onset Afib. No shortness of breath, chest pain or palpitation since 01/16 when she had new onset afib  COPD/Emphysema:quit smoking 2018. Endorses mild intermittent shortness of breath and takes albuterol prn only. . She had CT chest for lung cancer screening on 09/16/2018 , stable and repeat in 1 year. She denies productive cough in am's . She has mild wheezing at night but she states she is okay and does not want medications for that at this time  GERD:states has intermittent acid-reflux states usually last a couple of days and then resolves. She is more mindful of her eating and does not eat late at night. She states triggered by spicy food.   Osteoporosis Elevated: . Bone Density was done in 2019. Seeing Dr. Gabriel Carina and has Reclast injections every 6 months, next dose will be Feb 2021 . CT lung showed old compression fracture T  spine.   Elevated TSH: TSH up 02/2019, no symptoms we will recheck in 6 weeks  Intermittent right side pain: She has a history of herniated disc lumbar spine, states that over the past 6 months she has intermittent pain, but most of the time pain is present, described a soreness that states on right flank and radiates to her right axilla and down her right ulnar aspect of right arm. No weakness, no tingling or numbness. She has intermittent neck pain , no shoulder problems.    Patient Active Problem List   Diagnosis Date Noted  . 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 Fatty liver 09/29/2017  . Coronary artery disease 09/29/2017  . Centrilobular emphysema ([place]) 09/29/2017  . Basal cell carcinoma of nose 08/12/2017  . Estrogen deficiency 08/01/2017  . Aortic atherosclerosis ([place]) 09/14/2015  . Personal history of tobacco use, presenting hazards to health 08/31/2015  . Paresthesia of foot, bilateral 08/15/2015  . Breast mass, right 07/12/2015  . Elevated alkaline phosphatase level 05/29/2015  . COPD (chronic obstructive pulmonary disease) ([place]) 04/26/2015  . GERD (gastroesophageal reflux disease) 04/26/2015  . Hyperlipidemia 04/26/2015  . Hyperglycemia 04/26/2015  . Carotid stenosis 08/25/2014  . H/O malignant neoplasm of skin 08/13/2012    Past Surgical History:  Procedure Laterality Date  . ENDARTERECTOMY Left 08/25/2014   Procedure: ENDARTERECTOMY CAROTID;  Surgeon: Algernon Huxley, MD;  Location: [facility] ORS;  Service: Vascular;  Laterality: Left;  .  MOHS SURGERY    . MOHS SURGERY  09/23/2017   nose  . PERIPHERAL VASCULAR CATHETERIZATION N/A 07/20/2014   Procedure: Carotid Angiography;  Surgeon: Algernon Huxley, MD;  Location: [employer] CV LAB;  Service: Cardiovascular;  Laterality: N/A;  . TONSILLECTOMY    . TUBAL LIGATION      Family History  Problem Relation Age of Onset  . Heart attack Mother   . Hypercholesterolemia Mother   . Hypertension Mother   . Peripheral vascular  disease Mother   . Dementia Mother   . Hypothyroidism Mother   . CVA Father   . Liver cancer Father   . Diabetes Brother   . Kidney cancer Sister   . Diabetes Brother   . Alzheimer's disease Brother   . Other Brother        alzheimers  . Lymphoma Son   . Breast cancer Neg Hx       Current Outpatient Medications:  .  apixaban (ELIQUIS) 5 MG TABS tablet, Take 1 tablet (5 mg total) by mouth 2 (two) times daily., Disp: 180 tablet, Rfl: 2 .  aspirin 81 MG tablet, Take 81 mg by mouth daily., Disp: , Rfl:  .  atorvastatin (LIPITOR) 20 MG tablet, Take 0.5 tablets (10 mg total) by mouth daily., Disp: 90 tablet, Rfl: 2 .  calcium carbonate (OS-CAL - DOSED IN MG OF ELEMENTAL CALCIUM) 1250 (500 Ca) MG tablet, Take 1 tablet by mouth., Disp: , Rfl:  .  ezetimibe (ZETIA) 10 MG tablet, Take 1 tablet (10 mg total) by mouth daily., Disp: 90 tablet, Rfl: 2 .  Fish Oil-Cholecalciferol (FISH OIL + D3 PO), Take 1,000 mg by mouth 1 day or 1 dose., Disp: , Rfl:  .  metoprolol tartrate (LOPRESSOR) 25 MG tablet, Take 1 tablet (25 mg total) by mouth 2 (two) times daily., Disp: 180 tablet, Rfl: 2 .  omeprazole (PRILOSEC) 40 MG capsule, Take 40 mg by mouth as needed. , Disp: , Rfl:  .  Vitamin D, Ergocalciferol, (DRISDOL) 1.25 MG (50000 UNIT) CAPS capsule, Take 50,000 Units by mouth once a week., Disp: , Rfl:  .  cholecalciferol (VITAMIN D) 1000 units tablet, Take 1,000 Units by mouth daily., Disp: , Rfl:  .  pregabalin (LYRICA) 50 MG capsule, Take 1 capsule (50 mg total) by mouth 3 (three) times daily., Disp: 90 capsule, Rfl: 0  Allergies  Allergen Reactions  . Morphine Nausea Only and Nausea And Vomiting  . Morphine And Related Nausea And Vomiting    I personally reviewed active problem list, medication list, allergies, family history, social history with the patient/caregiver today.   ROS  Ten systems reviewed and is negative except as mentioned in HPI   Objective  Vitals:   03/11/19 0851  BP:  120/80  Pulse: 77  Resp: 16  Temp: (!) 97.1 F (36.2 C)  SpO2: 98%    Body mass index is 22.68 kg/m.  Physical Exam  Constitutional: Patient appears well-developed and well-nourished. No distress.  HEENT: head atraumatic, normocephalic, pupils equal and reactive to light Cardiovascular: Normal rate, regular rhythm and normal heart sounds.  No murmur heard. No BLE edema. Pulmonary/Chest: Effort normal and breath sounds normal. No respiratory distress. Abdominal: Soft.  There is no tenderness. Muscular Skeletal: right arm pain, no lesions noticed, she states chronic , no weakness, normal sensation, discussed trying lyrica  Psychiatric: Patient has a normal mood and affect. behavior is normal. Judgment and thought content normal.   Results for orders placed  or performed in visit on 03/09/19 (from the past 72 hour(s))  Basic metabolic panel     Status: None   Collection Time: 03/09/19 10:53 AM  Result Value Ref Range   Glucose 91 65 - 99 mg/dL   BUN 14 8 - 27 mg/dL   Creatinine, Ser 0.70 0.57 - 1.00 mg/dL   GFR calc non Af Amer 86 >59 mL/min/1.73   GFR calc Af Amer 99 >59 mL/min/1.73   BUN/Creatinine Ratio 20 12 - 28   Sodium 141 134 - 144 mmol/L   Potassium 4.1 3.5 - 5.2 mmol/L   Chloride 106 96 - 106 mmol/L   CO2 23 20 - 29 mmol/L   Calcium 9.2 8.7 - 10.3 mg/dL  CBC with Differential/Platelet     Status: None   Collection Time: 03/09/19 10:53 AM  Result Value Ref Range   WBC 9.0 3.4 - 10.8 x10E3/uL   RBC 4.57 3.77 - 5.28 x10E6/uL   Hemoglobin 13.4 11.1 - 15.9 g/dL   Hematocrit 40.3 34.0 - 46.6 %   MCV 88 79 - 97 fL   MCH 29.3 26.6 - 33.0 pg   MCHC 33.3 31.5 - 35.7 g/dL   RDW 12.7 11.7 - 15.4 %   Platelets 217 150 - 450 x10E3/uL   Neutrophils 70 Not Estab. %   Lymphs 22 Not Estab. %   Monocytes 6 Not Estab. %   Eos 1 Not Estab. %   Basos 1 Not Estab. %   Neutrophils Absolute 6.3 1.4 - 7.0 x10E3/uL   Lymphocytes Absolute 1.9 0.7 - 3.1 x10E3/uL   Monocytes Absolute  0.5 0.1 - 0.9 x10E3/uL   EOS (ABSOLUTE) 0.1 0.0 - 0.4 x10E3/uL   Basophils Absolute 0.1 0.0 - 0.2 x10E3/uL   Immature Granulocytes 0 Not Estab. %   Immature Grans (Abs) 0.0 0.0 - 0.1 x10E3/uL    PHQ2/9: Depression screen [facility] 2/9 03/11/2019 07/31/2018 02/02/2018 08/01/2017 07/25/2017  Decreased Interest 1 0 0 0 0  Down, Depressed, Hopeless 1 0 0 1 0  PHQ - 2 Score 2 0 0 1 0  Altered sleeping 2 3 0 - 0  Tired, decreased energy 1 0 0 - 0  Change in appetite 0 0 0 - 0  Feeling bad or failure about yourself  0 0 0 - 0  Trouble concentrating 0 0 0 - 0  Moving slowly or fidgety/restless 0 0 0 - 0  Suicidal thoughts 0 0 0 - 0  PHQ-9 Score 5 3 0 - 0  Difficult doing work/chores Not difficult at all Not difficult at all Not difficult at all - Not difficult at all   PHQ-2/9 Result is positive.    Fall Risk: Fall Risk  03/11/2019 07/31/2018 02/02/2018 08/01/2017 07/25/2017  Falls in the past year? 0 0 0 No Yes  Comment - - - - fell carrying laundry down the stairs  Number falls in past yr: 0 0 - - 1  Injury with Fall? 0 0 - - No  Risk for fall due to : - - - - Impaired vision  Risk for fall due to: Comment - - - - wears eyeglasses  Follow up - Falls prevention discussed - - Falls evaluation completed;Education provided;Falls prevention discussed    Assessment & Plan   1. Paroxysmal atrial fibrillation (HCC)  Doing well at this time, seen by cardiologist   2. Age-related osteoporosis without current pathological fracture  Keep follow up with Dr. Gabriel Carina   3. Aortic atherosclerosis ([place])  On statin therapy   4. Centrilobular emphysema ([place])  Does not want medications  5. Bilateral carotid artery stenosis  On statin therapy   6. Neuropathy  - pregabalin (LYRICA) 50 MG capsule; Take 1 capsule (50 mg total) by mouth 3 (three) times daily.  Dispense: 90 capsule; Refill: 0  7. Right flank pain  Possible neuropathy, we will try lyrica   8. Abnormal TSH  Recheck in one month   9.  Discharge from left nipple  - MM DIAG BREAST TOMO BILATERAL; Future - US BREAST LTD UNI LEFT INC AXILLA; Future - US BREAST LTD UNI RIGHT INC AXILLA; Future I discussed the assessment and treatment plan with the patient. The patient was provided an opportunity to ask questions and all were answered. The patient agreed with the plan and demonstrated an understanding of the instructions.

## 2019-03-16 ENCOUNTER — Ambulatory Visit
Admission: RE | Admit: 2019-03-16 | Discharge: 2019-03-16 | Disposition: A | Discharge status: Home / Self Care | Payer: Medicare Other | Source: Ambulatory Visit | Attending: Family Medicine | Admitting: Family Medicine

## 2019-03-16 ENCOUNTER — Other Ambulatory Visit: Payer: Self-pay | Admitting: Family Medicine

## 2019-03-16 ENCOUNTER — Other Ambulatory Visit: Payer: Medicare Other

## 2019-03-16 DIAGNOSIS — N6452 Nipple discharge: Secondary | ICD-10-CM | POA: Diagnosis not present

## 2019-03-16 DIAGNOSIS — R928 Other abnormal and inconclusive findings on diagnostic imaging of breast: Secondary | ICD-10-CM

## 2019-03-16 DIAGNOSIS — N6320 Unspecified lump in the left breast, unspecified quadrant: Secondary | ICD-10-CM

## 2019-03-26 ENCOUNTER — Other Ambulatory Visit: Payer: Self-pay

## 2019-03-26 ENCOUNTER — Ambulatory Visit (INDEPENDENT_AMBULATORY_CARE_PROVIDER_SITE_OTHER): Payer: Medicare Other

## 2019-03-26 DIAGNOSIS — I447 Left bundle-branch block, unspecified: Secondary | ICD-10-CM

## 2019-03-26 DIAGNOSIS — I4891 Unspecified atrial fibrillation: Secondary | ICD-10-CM | POA: Diagnosis not present

## 2019-03-29 ENCOUNTER — Telehealth: Payer: Self-pay

## 2019-03-29 NOTE — Telephone Encounter (Signed)
Call to patient to discuss echo results.   All questions were answered and no further orders are needed at this time.   Confirmed upcoming appt later this month.   Advised pt to call for any further questions or concerns.

## 2019-03-29 NOTE — Telephone Encounter (Signed)
-----   Message from Rise Mu, PA-C sent at 03/29/2019 11:42 AM EST ----- Echo showed normal pump function with some stiffening of the heart, mildly leaky mitral valve.   Optimal blood pressure and heart rate advised. Lopressor was increased at her last visit. Follow up as planned.

## 2019-03-30 ENCOUNTER — Ambulatory Visit
Admission: RE | Admit: 2019-03-30 | Discharge: 2019-03-30 | Disposition: A | Discharge status: Home / Self Care | Payer: Medicare Other | Source: Ambulatory Visit | Attending: Family Medicine | Admitting: Family Medicine

## 2019-03-30 DIAGNOSIS — N6320 Unspecified lump in the left breast, unspecified quadrant: Secondary | ICD-10-CM

## 2019-03-30 DIAGNOSIS — R928 Other abnormal and inconclusive findings on diagnostic imaging of breast: Secondary | ICD-10-CM | POA: Diagnosis not present

## 2019-03-31 LAB — SURGICAL PATHOLOGY

## 2019-04-11 ENCOUNTER — Other Ambulatory Visit: Payer: Self-pay | Admitting: Family Medicine

## 2019-04-11 DIAGNOSIS — G629 Polyneuropathy, unspecified: Secondary | ICD-10-CM

## 2019-04-11 NOTE — Telephone Encounter (Signed)
Requested medication (s) are due for refill today:yes  Requested medication (s) are on the active medication list: yes  Last refill: 03/11/19  #90  0 refills  Future visit scheduled  yes 04/14/2019  Notes to clinic: not delegated Requested Prescriptions  Pending Prescriptions Disp Refills   pregabalin (LYRICA) 50 MG capsule [Pharmacy Med Name: PREGABALIN 50 MG CAPSULE] 90 capsule 0    Sig: Take 1 capsule (50 mg total) by mouth 3 (three) times daily.      Not Delegated - Neurology:  Anticonvulsants - Controlled Failed - 04/11/2019 10:02 AM      Failed - This refill cannot be delegated      Passed - Valid encounter within last 12 months    Recent Outpatient Visits           1 month ago Paroxysmal atrial fibrillation [facility])   [facility] Steele Sizer, MD   8 months ago Acute cystitis without hematuria   [place], FNP   1 year ago Aortic atherosclerosis [facility])   [place], NP   1 year ago Aortic atherosclerosis [facility])   [place], Satira Anis, MD   1 year ago Aortic atherosclerosis [facility])   [place], Satira Anis, MD       Future Appointments             In 2 days Gollan, Kathlene November, MD [facility], [facility]   In 3 days Steele Sizer, MD [facility], [place]   In 3 months  [facility], [facility]

## 2019-04-12 NOTE — Progress Notes (Signed)
Cardiology Office Note  Date:  04/13/2019   ID:  Orvella, Dushaj 1945/04/18, MRN [id number]  PCP:  Steele Sizer, MD   Chief Complaint  Patient presents with  . office visit    1 month F/U; Meds verbally reviewed with patient.    HPI:  Ms. Aniece Marrin COPD Ex-smoker with 52 pack-year history. Quit last year 11/26/2017 Moh's surgery on the nose; healing up; went to [facility], Carotid stenosis, 1-39% stenosis b/l, CEA on the left Who presents for f/u of her  coronary calcium/CAD, atrial fib   Seen in the emergency room March 06, 2019 Diagnosis of atrial fibrillation experienced acute onset of sharp, spasm like R paraspinal lower back pain - has had spasms like this before Was in severe pain.   began to feel like her heart was racing and beating irregularly, along with mild SOB. She broke out in a sweat. The pain then resolved in her back but the palpitations persisted until EMS arrived. With EMS, she was noted to be in AFib and was given metop 2.5 IV x 1. She cardioverted  --- Records reviewed New LBBB compared to 2019  No more flank pain since then No further episodes of tachycardia Some chronic SOB, from smoking Now on metoprolol 25 BID  Was told to stay on asa by Dr. Lucky Cowboy  Stress at home, Son with lymphoma Labile pressure, [id number] to [id number] systolic   back pain arthritic pain Deals with it,  Neck and posterior shoulder pain  Total chol 145, LDL 84  In 09/2018  Echo 03/2019 1. Left ventricular ejection fraction, by visual estimation, is 55 to  60%. The left ventricle has normal function. There is no left ventricular  hypertrophy.  2. Left ventricular diastolic parameters are consistent with Grade II  diastolic dysfunction (pseudonormalization).  Mildly elevated pulmonary artery systolic pressure.   EKG personally reviewed by myself on todays visit Shows NSR rate 56 bpm lbbb  Other past medical hx reviewedCT scan  Mild Aortic and branch vessel  atherosclerosis. Normal heart size, without pericardial effusion.  mild Lad disease proximal region  Previous stress test 09/2015 No ischemia  Echo 09/2015 Normal EF >55   PMH:   has a past medical history of Allergy, Basal cell carcinoma of nose (08/12/2017), Carotid artery disease ([place]), Centrilobular emphysema ([place]) (09/29/2017), Collagen vascular disease ([place]), COPD (chronic obstructive pulmonary disease) ([place]), Coronary artery disease (09/29/2017), Elevated blood pressure (not hypertension), Fatty liver (09/29/2017), GERD (gastroesophageal reflux disease), Hyperlipidemia, Personal history of tobacco use, presenting hazards to health (08/31/2015), and PONV (postoperative nausea and vomiting).  PSH:    Past Surgical History:  Procedure Laterality Date  . BREAST BIOPSY    . ENDARTERECTOMY Left 08/25/2014   Procedure: ENDARTERECTOMY CAROTID;  Surgeon: Algernon Huxley, MD;  Location: [facility] ORS;  Service: Vascular;  Laterality: Left;  . MOHS SURGERY    . MOHS SURGERY  09/23/2017   nose  . PERIPHERAL VASCULAR CATHETERIZATION N/A 07/20/2014   Procedure: Carotid Angiography;  Surgeon: Algernon Huxley, MD;  Location: [place] CV LAB;  Service: Cardiovascular;  Laterality: N/A;  . TONSILLECTOMY    . TUBAL LIGATION      Current Outpatient Medications on File Prior to Visit  Medication Sig Dispense Refill  . apixaban (ELIQUIS) 5 MG TABS tablet Take 1 tablet (5 mg total) by mouth 2 (two) times daily. 180 tablet 2  . aspirin 81 MG tablet Take 81 mg by mouth daily.    [name]  atorvastatin (LIPITOR) 20 MG tablet Take 0.5 tablets (10 mg total) by mouth daily. 90 tablet 2  . calcium carbonate (OS-CAL - DOSED IN MG OF ELEMENTAL CALCIUM) 1250 (500 Ca) MG tablet Take 1 tablet by mouth.    . cholecalciferol (VITAMIN D) 1000 units tablet Take 1,000 Units by mouth daily.    [name] ezetimibe (ZETIA) 10 MG tablet Take 1 tablet (10 mg total) by mouth daily. 90 tablet 2  . Fish Oil-Cholecalciferol (FISH OIL + D3 PO) Take 1,000 mg  by mouth 1 day or 1 dose.    . metoprolol tartrate (LOPRESSOR) 25 MG tablet Take 1 tablet (25 mg total) by mouth 2 (two) times daily. 180 tablet 2  . omeprazole (PRILOSEC) 40 MG capsule Take 40 mg by mouth as needed.     . pregabalin (LYRICA) 50 MG capsule Take 1 capsule (50 mg total) by mouth 3 (three) times daily. 90 capsule 0  . Vitamin D, Ergocalciferol, (DRISDOL) 1.25 MG (50000 UNIT) CAPS capsule Take 50,000 Units by mouth once a week.     No current facility-administered medications on file prior to visit.    Allergies:   Morphine and Morphine and related   Social History:  The patient  reports that she quit smoking about 2 years ago. Her smoking use included cigarettes. She has a 55.00 pack-year smoking history. Her smokeless tobacco use includes snuff. She reports that she does not drink alcohol or use drugs.   Family History:   family history includes Alzheimer's disease in her brother; CVA in her father; Dementia in her mother; Diabetes in her brother and brother; Heart attack in her mother; Hypercholesterolemia in her mother; Hypertension in her mother; Hypothyroidism in her mother; Kidney cancer in her sister; Liver cancer in her father; Lymphoma in her son; Other in her brother; Peripheral vascular disease in her mother.    Review of Systems: Review of Systems  Constitutional: Negative.   Respiratory: Positive for shortness of breath.   Cardiovascular: Negative.   Gastrointestinal: Negative.   Musculoskeletal: Positive for back pain.  Neurological: Negative.   Psychiatric/Behavioral: Negative.   All other systems reviewed and are negative.   PHYSICAL EXAM: VS:  BP 140/76 (BP Location: Left Arm, Patient Position: Sitting, Cuff Size: Normal)   Pulse (!) 56   Ht 5\' 10"  (1.778 m)   Wt 159 lb 8 oz (72.3 kg)   SpO2 97%   BMI 22.89 kg/m  , BMI Body mass index is 22.89 kg/m. GEN: Well nourished, well developed, in no acute distress  HEENT: normal  Neck: no JVD, carotid  bruits, or masses Cardiac: RRR; no murmurs, rubs, or gallops,no edema  Respiratory:  clear to auscultation bilaterally, normal work of breathing GI: soft, nontender, nondistended, + BS MS: no deformity or atrophy  Skin: warm and dry, no rash Neuro:  Strength and sensation are intact Psych: euthymic mood, full affect  Recent Labs: 03/06/2019: ALT 36; Magnesium 2.0; TSH 5.041 03/09/2019: BUN 14; Creatinine, Ser 0.70; Hemoglobin 13.4; Platelets 217; Potassium 4.1; Sodium 141    Lipid Panel Lab Results  Component Value Date   CHOL 145 08/06/2018   HDL 36 (L) 08/06/2018   LDLCALC 84 08/06/2018   TRIG 151 (H) 08/06/2018      Wt Readings from Last 3 Encounters:  04/13/19 159 lb 8 oz (72.3 kg)  03/11/19 158 lb 1.6 oz (71.7 kg)  03/09/19 159 lb 8 oz (72.3 kg)      ASSESSMENT AND PLAN:  Aortic atherosclerosis (  [place]) - Plan: EKG 12-Lead Atorvastatin and zetia Good numbers  Coronary artery disease involving native coronary artery of native heart with angina pectoris ([place]) - Plan: EKG 12-Lead Mild focal disease proximal LAD region stress test echocardiogram reviewed with her 2017 Echo normal Ef 55% New LBBB, no anginal sx  Mixed hyperlipidemia  low-dose Lipitor 10 mg daily with Zetia  Bilateral carotid artery stenosis History of carotid endarterectomy on the left b/l ICA are consistent with a 1-39%   COPD Long history of smoking since age 38 Stable, periodic SOB  Disposition:   F/U 1 year   Total encounter time more than 25 minutes  Greater than 50% was spent in counseling and coordination of care with the patient    Orders Placed This Encounter  Procedures  . EKG 12-Lead     Signed, Esmond Plants, M.D., Ph.D. 04/13/2019  [place], [place]

## 2019-04-13 ENCOUNTER — Other Ambulatory Visit: Payer: Self-pay

## 2019-04-13 ENCOUNTER — Encounter: Payer: Self-pay | Admitting: Cardiovascular Disease

## 2019-04-13 ENCOUNTER — Ambulatory Visit: Payer: Medicare Other | Admitting: Cardiovascular Disease

## 2019-04-13 VITALS — BP 140/76 | HR 56 | Ht 70.0 in | Wt 159.5 lb

## 2019-04-13 DIAGNOSIS — I4891 Unspecified atrial fibrillation: Secondary | ICD-10-CM

## 2019-04-13 DIAGNOSIS — E785 Hyperlipidemia, unspecified: Secondary | ICD-10-CM

## 2019-04-13 DIAGNOSIS — I739 Peripheral vascular disease, unspecified: Secondary | ICD-10-CM

## 2019-04-13 DIAGNOSIS — J432 Centrilobular emphysema: Secondary | ICD-10-CM

## 2019-04-13 DIAGNOSIS — I6523 Occlusion and stenosis of bilateral carotid arteries: Secondary | ICD-10-CM | POA: Diagnosis not present

## 2019-04-13 DIAGNOSIS — I700 Atherosclerosis of aorta: Secondary | ICD-10-CM

## 2019-04-13 NOTE — Patient Instructions (Addendum)
Medication Instructions:  No changes  If you need a refill on your cardiac medications before your next appointment, please call your pharmacy.    Lab work: No new labs needed   If you have labs (blood work) drawn today and your tests are completely normal, you will receive your results only by: . MyChart Message (if you have MyChart) OR . A paper copy in the mail If you have any lab test that is abnormal or we need to change your treatment, we will call you to review the results.   Testing/Procedures: No new testing needed   Follow-Up: At [facility], you and your health needs are our priority.  As part of our continuing mission to provide you with exceptional heart care, we have created designated Provider Care Teams.  These Care Teams include your primary Cardiologist (physician) and Advanced Practice Providers (APPs -  Physician Assistants and Nurse Practitioners) who all work together to provide you with the care you need, when you need it.  . You will need a follow up appointment in 12 months  . Providers on your designated Care Team:   . Christopher Berge, NP . Ryan Dunn, PA-C . Jacquelyn Visser, PA-C  Any Other Special Instructions Will Be Listed Below (If Applicable).  COVID-19 Vaccine Information can be found at: [email or web address] For questions related to vaccine distribution or appointments, please email [email or web address] or call [phone].     

## 2019-04-14 ENCOUNTER — Encounter: Payer: Self-pay | Admitting: Family Medicine

## 2019-04-14 ENCOUNTER — Ambulatory Visit (INDEPENDENT_AMBULATORY_CARE_PROVIDER_SITE_OTHER): Payer: Medicare Other | Admitting: Family Medicine

## 2019-04-14 VITALS — BP 122/70 | HR 68 | Temp 97.5°F | Resp 16 | Ht 70.0 in | Wt 158.3 lb

## 2019-04-14 DIAGNOSIS — R10A1 Flank pain, right side: Secondary | ICD-10-CM

## 2019-04-14 DIAGNOSIS — J432 Centrilobular emphysema: Secondary | ICD-10-CM

## 2019-04-14 DIAGNOSIS — I700 Atherosclerosis of aorta: Secondary | ICD-10-CM

## 2019-04-14 DIAGNOSIS — R7989 Other specified abnormal findings of blood chemistry: Secondary | ICD-10-CM

## 2019-04-14 DIAGNOSIS — G629 Polyneuropathy, unspecified: Secondary | ICD-10-CM | POA: Diagnosis not present

## 2019-04-14 DIAGNOSIS — I480 Paroxysmal atrial fibrillation: Secondary | ICD-10-CM

## 2019-04-14 DIAGNOSIS — R109 Unspecified abdominal pain: Secondary | ICD-10-CM

## 2019-04-14 LAB — TSH: TSH: 3.64 mIU/L (ref 0.40–4.50)

## 2019-04-14 MED ORDER — PREGABALIN 50 MG PO CAPS: 50.0000 mg | ORAL_CAPSULE | Freq: Three times a day (TID) | ORAL | 0 refills | Status: DC

## 2019-04-14 NOTE — Progress Notes (Signed)
Name: SHAHNAZ KLASS   MRN: [id number]    DOB: 06-20-1945   Date:04/14/2019       Progress Note  Subjective  Chief Complaint  Chief Complaint  Patient presents with  . Follow-up    1 month F/U  . Labwork    Needs TSH rechecked  . Peripheral Neuropathy    Talk about Lyrica    HPI  Paroxysmal  Afib: went to [facility] on 03/06/2019 after onset of chest pain, palpitation and SOB, transported by EMS. She was given Metoprolol and cardioverted. Seen by Dr. Rockey Situ on 04/13/2019 and EKG during the visit showed normal sinus rhythm. She still has some sob but no longer having chest pain or palpitation   Echo 03/2019 1. Left ventricular ejection fraction, by visual estimation, is 55 to  60%. The left ventricle has normal function. There is no left ventricular  hypertrophy.  2. Left ventricular diastolic parameters are consistent with Grade II  diastolic dysfunction (pseudonormalization).  Mildly elevated pulmonary artery systolic pressure.   Abnormal mammogram on 02/2019, went back for breast biopsy of left and results negative, repeat in one year  Elevated TSH: she is back today for repeat level, no constipation , dry skin is stable, no significant fatigue   Intermittent right side pain:  She has a history of herniated disc lumbar spine, however pain is mostly on mid back has improved with Lyrica, she states back pain down to 2/10, the sharp pain and spasm on right flank has resolved, she has not noticed pain on ulnar aspect of right arm. She is doing better with Lyrica, only taking it at night since the pain is worse at night   Peripheral neuropathy: she states she has constant tingling and pain on her feet but not severe until the night , Lyrica has helped with pain . She states she has a history of injury on both feet, she has a hot sensation that is intense on top of left foot. She states pain was 8/10 and is now down to 4/10  Stress: son lives in [place]. He has HIV and also T-cell lymphoma,  she worries about him, she talked to him last night.    Patient Active Problem List   Diagnosis Date Noted  . 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 Fatty liver 09/29/2017  . Coronary artery disease 09/29/2017  . Centrilobular emphysema ([place]) 09/29/2017  . Basal cell carcinoma of nose 08/12/2017  . Estrogen deficiency 08/01/2017  . Aortic atherosclerosis ([place]) 09/14/2015  . Personal history of tobacco use, presenting hazards to health 08/31/2015  . Paresthesia of foot, bilateral 08/15/2015  . Breast mass, right 07/12/2015  . Elevated alkaline phosphatase level 05/29/2015  . COPD (chronic obstructive pulmonary disease) ([place]) 04/26/2015  . GERD (gastroesophageal reflux disease) 04/26/2015  . Hyperlipidemia 04/26/2015  . Hyperglycemia 04/26/2015  . Carotid stenosis 08/25/2014  . H/O malignant neoplasm of skin 08/13/2012    Past Surgical History:  Procedure Laterality Date  . BREAST BIOPSY    . ENDARTERECTOMY Left 08/25/2014   Procedure: ENDARTERECTOMY CAROTID;  Surgeon: Algernon Huxley, MD;  Location: [facility] ORS;  Service: Vascular;  Laterality: Left;  . MOHS SURGERY    . MOHS SURGERY  09/23/2017   nose  . PERIPHERAL VASCULAR CATHETERIZATION N/A 07/20/2014   Procedure: Carotid Angiography;  Surgeon: Algernon Huxley, MD;  Location: [facility];  Service: Cardiovascular;  Laterality: N/A;  . TONSILLECTOMY    . TUBAL LIGATION      Family History  Problem Relation Age of Onset  . Heart attack Mother   . Hypercholesterolemia Mother   . Hypertension Mother   . Peripheral vascular disease Mother   . Dementia Mother   . Hypothyroidism Mother   . CVA Father   . Liver cancer Father   . Diabetes Brother   . Kidney cancer Sister   . Diabetes Brother   . Alzheimer's disease Brother   . Other Brother        alzheimers  . Lymphoma Son   . Breast cancer Neg Hx     Social History   Tobacco Use  . Smoking status: Former Smoker    Packs/day: 1.00    Years: 55.00    Pack years:  55.00    Types: Cigarettes    Quit date: 11/26/2016    Years since quitting: 2.3  . Smokeless tobacco: Current User    Types: Snuff  . Tobacco comment: smoking cessation materials not required  Substance Use Topics  . Alcohol use: No  . Drug use: No     Current Outpatient Medications:  .  apixaban (ELIQUIS) 5 MG TABS tablet, Take 1 tablet (5 mg total) by mouth 2 (two) times daily., Disp: 180 tablet, Rfl: 2 .  aspirin 81 MG tablet, Take 81 mg by mouth daily., Disp: , Rfl:  .  atorvastatin (LIPITOR) 20 MG tablet, Take 0.5 tablets (10 mg total) by mouth daily., Disp: 90 tablet, Rfl: 2 .  calcium carbonate (OS-CAL - DOSED IN MG OF ELEMENTAL CALCIUM) 1250 (500 Ca) MG tablet, Take 1 tablet by mouth., Disp: , Rfl:  .  cholecalciferol (VITAMIN D) 1000 units tablet, Take 1,000 Units by mouth daily., Disp: , Rfl:  .  ezetimibe (ZETIA) 10 MG tablet, Take 1 tablet (10 mg total) by mouth daily., Disp: 90 tablet, Rfl: 2 .  Fish Oil-Cholecalciferol (FISH OIL + D3 PO), Take 1,000 mg by mouth 1 day or 1 dose., Disp: , Rfl:  .  metoprolol tartrate (LOPRESSOR) 25 MG tablet, Take 1 tablet (25 mg total) by mouth 2 (two) times daily., Disp: 180 tablet, Rfl: 2 .  omeprazole (PRILOSEC) 40 MG capsule, Take 40 mg by mouth as needed. , Disp: , Rfl:  .  pregabalin (LYRICA) 50 MG capsule, Take 1 capsule (50 mg total) by mouth 3 (three) times daily., Disp: 90 capsule, Rfl: 0 .  Vitamin D, Ergocalciferol, (DRISDOL) 1.25 MG (50000 UNIT) CAPS capsule, Take 50,000 Units by mouth once a week., Disp: , Rfl:   Allergies  Allergen Reactions  . Morphine Nausea Only and Nausea And Vomiting  . Morphine And Related Nausea And Vomiting    I personally reviewed active problem list, medication list, allergies, family history, social history, health maintenance with the patient/caregiver today.   ROS  Constitutional: Negative for fever or weight change.  Respiratory: Positive  for cough and shortness of breath.    Cardiovascular: Negative for chest pain or palpitations.  Gastrointestinal: Negative for abdominal pain, no bowel changes.  Musculoskeletal: Negative for gait problem or joint swelling.  Skin: Negative for rash.  Neurological: Positive  for dizziness/ when she gets up fast  or headache.  No other specific complaints in a complete review of systems (except as listed in HPI above).   Objective  Vitals:   04/14/19 0853  BP: 122/70  Pulse: 68  Resp: 16  Temp: (!) 97.5 F (36.4 C)  TempSrc: Temporal  SpO2: 94%  Weight: 158 lb 4.8 oz (71.8 kg)  Height: 5'  10" (1.778 m)    Body mass index is 22.71 kg/m.  Physical Exam  Constitutional: Patient appears well-developed and well-nourished. No distress.  HEENT: head atraumatic, normocephalic, pupils equal and reactive to light, Cardiovascular: Normal rate, regular rhythm and normal heart sounds.  No murmur heard. No BLE edema. Pulmonary/Chest: Effort normal and breath sounds normal. No respiratory distress. Abdominal: Soft.  There is no tenderness. Muscular Skeletal: no pain during palpation of back Psychiatric: Patient has a normal mood and affect. behavior is normal. Judgment and thought content normal.  Recent Results (from the past 2160 hour(s))  CBC     Status: None   Collection Time: 03/06/19  5:01 PM  Result Value Ref Range   WBC 9.4 4.0 - 10.5 K/uL   RBC 4.50 3.87 - 5.11 MIL/uL   Hemoglobin 13.1 12.0 - 15.0 g/dL   HCT 39.5 36.0 - 46.0 %   MCV 87.8 80.0 - 100.0 fL   MCH 29.1 26.0 - 34.0 pg   MCHC 33.2 30.0 - 36.0 g/dL   RDW 12.7 11.5 - 15.5 %   Platelets 217 150 - 400 K/uL   nRBC 0.0 0.0 - 0.2 %    Comment: Performed at [facility], [place], [place] [address]  Troponin I (High Sensitivity)     Status: None   Collection Time: 03/06/19  5:01 PM  Result Value Ref Range   Troponin I (High Sensitivity) 3 <18 ng/L    Comment: (NOTE) Elevated high sensitivity troponin I (hsTnI) values and  significant  changes across serial measurements may suggest ACS but many other  chronic and acute conditions are known to elevate hsTnI results.  Refer to the "Links" section for chest pain algorithms and additional  guidance. Performed at [facility], [address] [place], [place] [address]   Comprehensive metabolic panel     Status: Abnormal   Collection Time: 03/06/19  5:01 PM  Result Value Ref Range   Sodium 140 135 - 145 mmol/L   Potassium 3.5 3.5 - 5.1 mmol/L   Chloride 108 98 - 111 mmol/L   CO2 24 22 - 32 mmol/L   Glucose, Bld 115 (H) 70 - 99 mg/dL   BUN 17 8 - 23 mg/dL   Creatinine, Ser 1.02 (H) 0.44 - 1.00 mg/dL   Calcium 8.7 (L) 8.9 - 10.3 mg/dL   Total Protein 7.0 6.5 - 8.1 g/dL   Albumin 3.9 3.5 - 5.0 g/dL   AST 28 15 - 41 U/L   ALT 36 0 - 44 U/L   Alkaline Phosphatase 82 38 - 126 U/L   Total Bilirubin 0.6 0.3 - 1.2 mg/dL   GFR calc non Af Amer 55 (L) >60 mL/min   GFR calc Af Amer >60 >60 mL/min   Anion gap 8 5 - 15    Comment: Performed at [facility], [address], [place], [place] [address]  Magnesium     Status: None   Collection Time: 03/06/19  5:01 PM  Result Value Ref Range   Magnesium 2.0 1.7 - 2.4 mg/dL    Comment: Performed at [facility], [address] [place],  [address]  TSH     Status: Abnormal   Collection Time: 03/06/19  5:01 PM  Result Value Ref Range   TSH 5.041 (H) 0.350 - 4.500 uIU/mL    Comment: Performed by a 3rd Generation assay with a functional sensitivity of <=0.01 uIU/mL. Performed at [facility], [address] [place], [place] [address]   Troponin I (High Sensitivity)     Status: None   Collection Time: 03/06/19  7:35 PM  Result Value Ref Range   Troponin I (High Sensitivity) 7 <18 ng/L    Comment: (NOTE) Elevated high sensitivity troponin I (hsTnI) values and significant  changes across serial measurements may suggest ACS but many other  chronic and acute conditions are  known to elevate hsTnI results.  Refer to the "Links" section for chest pain algorithms and additional  guidance. Performed at [facility], [address] [place], [place] [id number]   Basic metabolic panel     Status: None   Collection Time: 03/09/19 10:53 AM  Result Value Ref Range   Glucose 91 65 - 99 mg/dL   BUN 14 8 - 27 mg/dL   Creatinine, Ser 0.70 0.57 - 1.00 mg/dL   GFR calc non Af Amer 86 >59 mL/min/1.73   GFR calc Af Amer 99 >59 mL/min/1.73   BUN/Creatinine Ratio 20 12 - 28   Sodium 141 134 - 144 mmol/L   Potassium 4.1 3.5 - 5.2 mmol/L   Chloride 106 96 - 106 mmol/L   CO2 23 20 - 29 mmol/L   Calcium 9.2 8.7 - 10.3 mg/dL  CBC with Differential/Platelet     Status: None   Collection Time: 03/09/19 10:53 AM  Result Value Ref Range   WBC 9.0 3.4 - 10.8 x10E3/uL   RBC 4.57 3.77 - 5.28 x10E6/uL   Hemoglobin 13.4 11.1 - 15.9 g/dL   Hematocrit 40.3 34.0 - 46.6 %   MCV 88 79 - 97 fL   MCH 29.3 26.6 - 33.0 pg   MCHC 33.3 31.5 - 35.7 g/dL   RDW 12.7 11.7 - 15.4 %   Platelets 217 150 - 450 x10E3/uL   Neutrophils 70 Not Estab. %   Lymphs 22 Not Estab. %   Monocytes 6 Not Estab. %   Eos 1 Not Estab. %   Basos 1 Not Estab. %   Neutrophils Absolute 6.3 1.4 - 7.0 x10E3/uL   Lymphocytes Absolute 1.9 0.7 - 3.1 x10E3/uL   Monocytes Absolute 0.5 0.1 - 0.9 x10E3/uL   EOS (ABSOLUTE) 0.1 0.0 - 0.4 x10E3/uL   Basophils Absolute 0.1 0.0 - 0.2 x10E3/uL   Immature Granulocytes 0 Not Estab. %   Immature Grans (Abs) 0.0 0.0 - 0.1 x10E3/uL  Surgical pathology     Status: None   Collection Time: 03/30/19  1:25 PM  Result Value Ref Range   SURGICAL PATHOLOGY      SURGICAL PATHOLOGY CASE: [phone] [phone] PATIENT: Hattye Briere Surgical Pathology Report     Specimen Submitted: A. Breast, left  Clinical History: Left breast retroareolar mass      DIAGNOSIS: A. LEFT BREAST, ULTRASOUND-GUIDED BIOPSY: - BENIGN RETROAREOLAR DUCT TISSUE. - NEGATIVE FOR ATYPIA AND  MALIGNANCY.  Comment: Step sections through the block are examined.   The findings may not be representative of the targeted lesion.  Clinical correlation is recommended.   GROSS DESCRIPTION: A. Labeled: Ultrasound-guided left breast retroareolar core biopsies Received: In formalin Time/date in fixative: Tissue procedure time 1:17 PM, tissue put in formalin time 1:17 PM on 03/30/2019 Cold ischemic time: Less than 1 minute Total fixation time: 9 hours Core pieces: 3 Size: From 0.8-1.1 cm in length and 0.1 cm in diameter Description: Fibrofatty soft tissue cores Ink color: Blue Entirely submitted in 1 cassette.    Final Diagnosis performed by Betsy Pries,  MD.   Electronically  signed 03/31/2019 3:10:12PM The electronic signature indicates that the named Attending Pathologist has evaluated the specimen Technical component performed at [facility], [address] [place], [place] [address] Lab: [phone] Dir: Rush Farmer, MD, MMM  Professional component performed at [facility], [facility], [place], [place], [place] [address] Lab: [phone] Dir: Dellia Nims. Rubinas, MD      PHQ2/9: Depression screen [facility] 2/9 04/14/2019 03/11/2019 07/31/2018 02/02/2018 08/01/2017  Decreased Interest 0 1 0 0 0  Down, Depressed, Hopeless 0 1 0 0 1  PHQ - 2 Score 0 2 0 0 1  Altered sleeping 0 2 3 0 -  Tired, decreased energy 0 1 0 0 -  Change in appetite 0 0 0 0 -  Feeling bad or failure about yourself  0 0 0 0 -  Trouble concentrating 0 0 0 0 -  Moving slowly or fidgety/restless 0 0 0 0 -  Suicidal thoughts 0 0 0 0 -  PHQ-9 Score 0 5 3 0 -  Difficult doing work/chores Not difficult at all Not difficult at all Not difficult at all Not difficult at all -    phq 9 is negative   Fall Risk: Fall Risk  04/14/2019 03/11/2019 07/31/2018 02/02/2018 08/01/2017  Falls in the past year? 0 0 0 0 No  Comment - - - - -  Number falls in past yr: 0 0 0 - -  Injury with Fall? 0 0 0 - -   Risk for fall due to : - - - - -  Risk for fall due to: Comment - - - - -  Follow up - - Falls prevention discussed - -    Functional Status Survey: Is the patient deaf or have difficulty hearing?: No Does the patient have difficulty seeing, even when wearing glasses/contacts?: Yes Does the patient have difficulty concentrating, remembering, or making decisions?: No Does the patient have difficulty walking or climbing stairs?: No Does the patient have difficulty dressing or bathing?: No Does the patient have difficulty doing errands alone such as visiting a doctor's office or shopping?: No    Assessment & Plan  1. Paroxysmal atrial fibrillation (HCC)  Doing well   2. Aortic atherosclerosis ([place])  On statin therapy and Zetia   3. Centrilobular emphysema ([place])  Discussed stopping smoking again   4. Neuropathy  - pregabalin (LYRICA) 50 MG capsule; Take 1 capsule (50 mg total) by mouth 3 (three) times daily.  Dispense: 90 capsule; Refill: 0  5. Right flank pain  - pregabalin (LYRICA) 50 MG capsule; Take 1 capsule (50 mg total) by mouth 3 (three) times daily.  Dispense: 90 capsule; Refill: 0  6. Abnormal TSH  - TSH

## 2019-07-06 ENCOUNTER — Telehealth: Payer: Self-pay | Admitting: Family Medicine

## 2019-07-06 NOTE — Chronic Care Management (AMB) (Signed)
  Chronic Care Management   Note  07/06/2019 Name: Kristie Walters MRN: [id number] DOB: 30-Dec-1945  PARRIS SIGNER is a 74 y.o. year old female who is a primary care patient of Steele Sizer, MD. I reached out to Deeann Saint by phone today in response to a referral sent by Ms. Tor Netters [place]'s health plan.     Ms. Salah was given information about Chronic Care Management services today including:  1. CCM service includes personalized support from designated clinical staff supervised by her physician, including individualized plan of care and coordination with other care providers 2. 24/7 contact phone numbers for assistance for urgent and routine care needs. 3. Service will only be billed when office clinical staff spend 20 minutes or more in a month to coordinate care. 4. Only one practitioner may furnish and bill the service in a calendar month. 5. The patient may stop CCM services at any time (effective at the end of the month) by phone call to the office staff. 6. The patient will be responsible for cost sharing (co-pay) of up to 20% of the service fee (after annual deductible is met).  Patient agreed to services and verbal consent obtained.   Follow up plan: Telephone appointment with care management team member scheduled for:07/21/2019  Noreene Larsson, [address], [place], [place] [address] Direct Dial: [phone] [email or web address]: [email or web address]

## 2019-07-12 ENCOUNTER — Other Ambulatory Visit: Payer: Self-pay

## 2019-07-12 ENCOUNTER — Encounter: Payer: Self-pay | Admitting: Family Medicine

## 2019-07-12 ENCOUNTER — Ambulatory Visit (INDEPENDENT_AMBULATORY_CARE_PROVIDER_SITE_OTHER): Payer: Medicare Other | Admitting: Family Medicine

## 2019-07-12 VITALS — BP 122/84 | HR 68 | Temp 97.5°F | Resp 16 | Ht 70.0 in | Wt 160.9 lb

## 2019-07-12 DIAGNOSIS — I6523 Occlusion and stenosis of bilateral carotid arteries: Secondary | ICD-10-CM

## 2019-07-12 DIAGNOSIS — M545 Low back pain, unspecified: Secondary | ICD-10-CM

## 2019-07-12 DIAGNOSIS — M810 Age-related osteoporosis without current pathological fracture: Secondary | ICD-10-CM

## 2019-07-12 DIAGNOSIS — I700 Atherosclerosis of aorta: Secondary | ICD-10-CM | POA: Diagnosis not present

## 2019-07-12 DIAGNOSIS — I25119 Atherosclerotic heart disease of native coronary artery with unspecified angina pectoris: Secondary | ICD-10-CM

## 2019-07-12 DIAGNOSIS — E782 Mixed hyperlipidemia: Secondary | ICD-10-CM

## 2019-07-12 DIAGNOSIS — J432 Centrilobular emphysema: Secondary | ICD-10-CM

## 2019-07-12 DIAGNOSIS — G629 Polyneuropathy, unspecified: Secondary | ICD-10-CM

## 2019-07-12 DIAGNOSIS — I480 Paroxysmal atrial fibrillation: Secondary | ICD-10-CM

## 2019-07-12 DIAGNOSIS — M542 Cervicalgia: Secondary | ICD-10-CM

## 2019-07-12 DIAGNOSIS — M546 Pain in thoracic spine: Secondary | ICD-10-CM

## 2019-07-12 DIAGNOSIS — R351 Nocturia: Secondary | ICD-10-CM

## 2019-07-12 DIAGNOSIS — R300 Dysuria: Secondary | ICD-10-CM

## 2019-07-12 LAB — POCT URINALYSIS DIPSTICK
Bilirubin, UA: NEGATIVE
Glucose, UA: NEGATIVE
Ketones, UA: NEGATIVE
Nitrite, UA: NEGATIVE
Protein, UA: POSITIVE — AB
Spec Grav, UA: 1.01 (ref 1.010–1.025)
Urobilinogen, UA: 0.2 E.U./dL
pH, UA: 5 (ref 5.0–8.0)

## 2019-07-12 MED ORDER — PREGABALIN 50 MG PO CAPS: 50.0000 mg | ORAL_CAPSULE | Freq: Two times a day (BID) | ORAL | 1 refills | Status: DC

## 2019-07-12 MED ORDER — CIPROFLOXACIN HCL 250 MG PO TABS: 250.0000 mg | ORAL_TABLET | Freq: Two times a day (BID) | ORAL | 0 refills | Status: DC

## 2019-07-12 MED ORDER — SPIRIVA HANDIHALER 18 MCG IN CAPS: 18.0000 ug | ORAL_CAPSULE | Freq: Every day | RESPIRATORY_TRACT | 1 refills | Status: DC

## 2019-07-12 NOTE — Progress Notes (Signed)
Name: Kristie Walters   MRN: [id number]    DOB: 05/30/1945   Date:07/12/2019       Progress Note  Subjective  Chief Complaint  Chief Complaint  Patient presents with  . Atrial Fibrillation    has not been the same since last hospitalization  . Thyroid Problem    follow up thyroid labs, has never been on medication  . Gastroesophageal Reflux  . Urinary Frequency    HPI  Paroxysmal  Afib: went to [facility] on 03/06/2019 after onset of chest pain, palpitation and SOB, transported by EMS. She was given Metoprolol and cardioverted. Seen by Dr. Rockey Situ on 04/13/2019 and EKG during the visit showed normal sinus rhythm. She still has some sob , she had one episode of chest pain associated with diaphoresis after she went to the [employer] - she states it happened a couple of weeks ago.   Echo 03/2019 1. Left ventricular ejection fraction, by visual estimation, is 55 to  60%. The left ventricle has normal function. There is no left ventricular  hypertrophy.  2. Left ventricular diastolic parameters are consistent with Grade II  diastolic dysfunction (pseudonormalization).  Mildly elevated pulmonary artery systolic pressure.   CAD/Carotid Artery Stenosis: coronary artery calcium incidentally found on CT but stress test no significant ischemia in 2017, she is having chest pain now, explained it may be from rapid afib response but needs to discuss it with cardiologist . She also has carotid atherosclerosis and sees Dr. Lucky Cowboy - history of left sided CEA. On statin and zetia.   Abnormal mammogram on 02/2019, went back for breast biopsy of left and results negative, repeat in one year. Unchanged   Elevated TSH: last level back to normal.   Thoracic spine compression fracture found on CT done 07//2020, she has chronic pain on thoracic spine, it radiates to both side, she is on lyrica and seems to be stable on Lyrica. She will contact endo to go back for another Reclast, she missed her last done that was  due 03/2019 .   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 She states not a constant problem, about a couple of times a week. No weakness on right arm.   Peripheral neuropathy: she states she has constant tingling and pain on her feet but not severe until the night , Lyrica has helped with pain . She states she has a history of injury on both feet, she has a hot sensation that is intense on top of left foot. She states pain was 8/10 and has been stable around 4/10 since started on medication.   Stress: son lives in [place]. He has HIV and also T-cell lymphoma, she worries about him, she saw him last week and he is not looking good   Urinary frequency: she has noticed some discomfort with urination, also increase in nocturia from once per night to about three times per night and waking up with upper back pain over the past two weeks, no hematuria. No fever or chills.   Patient Active Problem List   Diagnosis Date Noted  . 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 Fatty liver 09/29/2017  . Coronary artery  disease 09/29/2017  . Centrilobular emphysema ([place]) 09/29/2017  . Basal cell carcinoma of nose 08/12/2017  . Estrogen deficiency 08/01/2017  . Aortic atherosclerosis ([place]) 09/14/2015  . Personal history of tobacco use, presenting hazards to health 08/31/2015  . Paresthesia of foot, bilateral 08/15/2015  . Breast mass, right 07/12/2015  . Elevated alkaline phosphatase level 05/29/2015  . COPD (chronic obstructive pulmonary disease) ([place]) 04/26/2015  . GERD (gastroesophageal reflux disease) 04/26/2015  . Hyperlipidemia 04/26/2015  . Hyperglycemia 04/26/2015  . Carotid stenosis  08/25/2014  . H/O malignant neoplasm of skin 08/13/2012    Past Surgical History:  Procedure Laterality Date  . BREAST BIOPSY    . ENDARTERECTOMY Left 08/25/2014   Procedure: ENDARTERECTOMY CAROTID;  Surgeon: Algernon Huxley, MD;  Location: [facility] ORS;  Service: Vascular;  Laterality: Left;  . MOHS SURGERY    . MOHS SURGERY  09/23/2017   nose  . PERIPHERAL VASCULAR CATHETERIZATION N/A 07/20/2014   Procedure: Carotid Angiography;  Surgeon: Algernon Huxley, MD;  Location: [place] [facility];  Service: Cardiovascular;  Laterality: N/A;  . TONSILLECTOMY    . TUBAL LIGATION      Family History  Problem Relation Age of Onset  . Heart attack Mother   . Hypercholesterolemia Mother   . Hypertension Mother   . Peripheral vascular disease Mother   . Dementia Mother   . Hypothyroidism Mother   . CVA Father   . Liver cancer Father   . Diabetes Brother   . Kidney cancer Sister   . Diabetes Brother   . Alzheimer's disease Brother   . Other Brother        alzheimers  . Lymphoma Son   . HIV Son   . Breast cancer Neg Hx     Social History   Tobacco Use  . Smoking status: Former Smoker    Packs/day: 1.00    Years: 55.00    Pack years: 55.00    Types: Cigarettes    Quit date: 11/26/2016    Years since quitting: 2.6  . Smokeless tobacco: Current User    Types: Snuff  . Tobacco comment: smoking cessation materials not required  Substance Use Topics  . Alcohol use: No     Current Outpatient Medications:  .  apixaban (ELIQUIS) 5 MG TABS tablet, Take 1 tablet (5 mg total) by mouth 2 (two) times daily., Disp: 180 tablet, Rfl: 2 .  aspirin 81 MG tablet, Take 81 mg by mouth daily., Disp: , Rfl:  .  atorvastatin (LIPITOR) 20 MG tablet, Take 0.5 tablets (10 mg total) by mouth daily., Disp: 90 tablet, Rfl: 2 .  calcium carbonate (OS-CAL - DOSED IN MG OF ELEMENTAL CALCIUM) 1250 (500 Ca) MG tablet, Take 1 tablet by mouth., Disp: , Rfl:  .  cholecalciferol (VITAMIN D) 1000 units tablet, Take 1,000  Units by mouth daily., Disp: , Rfl:  .  ezetimibe (ZETIA) 10 MG tablet, Take 1 tablet (10 mg total) by mouth daily., Disp: 90 tablet, Rfl: 2 .  Fish Oil-Cholecalciferol (FISH OIL + D3 PO), Take 1,000 mg by mouth 1 day or 1 dose., Disp: , Rfl:  .  metoprolol tartrate (LOPRESSOR) 25 MG tablet, Take 1 tablet (25 mg total) by mouth 2 (two) times daily., Disp: 180 tablet, Rfl: 2 .  omeprazole (PRILOSEC) 40 MG capsule, Take 40 mg by mouth as needed. , Disp: , Rfl:  .  pregabalin (LYRICA) 50 MG capsule, Take 1 capsule (50 mg total) by mouth 3 (three) times  daily., Disp: 90 capsule, Rfl: 0 .  Vitamin D, Ergocalciferol, (DRISDOL) 1.25 MG (50000 UNIT) CAPS capsule, Take 50,000 Units by mouth once a week., Disp: , Rfl:   Allergies  Allergen Reactions  . Morphine Nausea Only and Nausea And Vomiting  . Morphine And Related Nausea And Vomiting    I personally reviewed active problem list, medication list, allergies, family history, social history, health maintenance with the patient/caregiver today.   ROS  Constitutional: Negative for fever or weight change.  Respiratory: Negative for cough but has shortness of breath.   Cardiovascular: Positive  for chest pain and occasional  palpitations.  Gastrointestinal: Negative for abdominal pain, no bowel changes.  Musculoskeletal: Negative for gait problem or joint swelling.  Skin: Negative for rash.  Neurological: Negative for dizziness or headache.  No other specific complaints in a complete review of systems (except as listed in HPI above).  Objective  Vitals:   07/12/19 0951  BP: 122/84  Pulse: 68  Resp: 16  Temp: (!) 97.5 F (36.4 C)  TempSrc: Temporal  SpO2: 93%  Weight: 160 lb 14.4 oz (73 kg)  Height: 5\' 10"  (1.778 m)    Body mass index is 23.09 kg/m.  Physical Exam  Constitutional: Patient appears well-developed and well-nourished. No distress.  HEENT: head atraumatic, normocephalic, pupils equal and reactive to  light Cardiovascular: Normal rate, regular rhythm and normal heart sounds.  No murmur heard. No BLE edema. Pulmonary/Chest: Effort normal and breath sounds normal. No respiratory distress. Abdominal: Soft.  There is no tenderness. Muscular Skeletal: no pain during palpation of spine, decrease rom of neck  Psychiatric: Patient has a normal mood and affect. behavior is normal. Judgment and thought content normal.  Recent Results (from the past 2160 hour(s))  TSH     Status: None   Collection Time: 04/14/19  9:35 AM  Result Value Ref Range   TSH 3.64 0.40 - 4.50 mIU/L  POCT urinalysis dipstick     Status: Abnormal   Collection Time: 07/12/19 10:06 AM  Result Value Ref Range   Color, UA gold    Clarity, UA cloudy    Glucose, UA Negative Negative   Bilirubin, UA negative    Ketones, UA negative    Spec Grav, UA 1.010 1.010 - 1.025   Blood, UA large    pH, UA 5.0 5.0 - 8.0   Protein, UA Positive (A) Negative   Urobilinogen, UA 0.2 0.2 or 1.0 E.U./dL   Nitrite, UA negative    Leukocytes, UA Large (3+) (A) Negative   Appearance cloudy    Odor none     PHQ2/9: Depression screen [facility] 2/9 07/12/2019 04/14/2019 03/11/2019 07/31/2018 02/02/2018  Decreased Interest 0 0 1 0 0  Down, Depressed, Hopeless 0 0 1 0 0  PHQ - 2 Score 0 0 2 0 0  Altered sleeping 0 0 2 3 0  Tired, decreased energy 0 0 1 0 0  Change in appetite 0 0 0 0 0  Feeling bad or failure about yourself  0 0 0 0 0  Trouble concentrating 0 0 0 0 0  Moving slowly or fidgety/restless 0 0 0 0 0  Suicidal thoughts 0 0 0 0 0  PHQ-9 Score 0 0 5 3 0  Difficult doing work/chores Not difficult at all Not difficult at all Not difficult at all Not difficult at all Not difficult at all  Some recent data might be hidden    phq 9 is negative   Fall Risk:  Fall Risk  07/12/2019 04/14/2019 03/11/2019 07/31/2018 02/02/2018  Falls in the past year? 0 0 0 0 0  Comment - - - - -  Number falls in past yr: 0 0 0 0 -  Injury with Fall? 0 0 0 0 -   Risk for fall due to : - - - - -  Risk for fall due to: Comment - - - - -  Follow up Falls evaluation completed - - Falls prevention discussed -      Assessment & Plan  1. Acute low back pain, unspecified back pain laterality, unspecified whether sciatica present  - POCT urinalysis dipstick  2. Paroxysmal atrial fibrillation (HCC)  - COMPLETE METABOLIC PANEL WITH GFR  3. Aortic atherosclerosis (HCC)  - Lipid panel  4. Centrilobular emphysema ([place])  We will try Spiriva  5. Age-related osteoporosis without current pathological fracture  Contact Dr. Honor Junes   6. Bilateral carotid artery stenosis  Up to date with follow up with Dr. Lucky Cowboy  7. Coronary artery disease involving native coronary artery of native heart with angina pectoris (HCC)  - Lipid panel  8. Neuropathy  - pregabalin (LYRICA) 50 MG capsule; Take 1 capsule (50 mg total) by mouth 2 (two) times daily.  Dispense: 180 capsule; Refill: 1  9. Dysuria  - Urine Culture - ciprofloxacin (CIPRO) 250 MG tablet; Take 1 tablet (250 mg total) by mouth 2 (two) times daily.  Dispense: 6 tablet; Refill: 0  10. Nocturia  - Urine Culture - ciprofloxacin (CIPRO) 250 MG tablet; Take 1 tablet (250 mg total) by mouth 2 (two) times daily.  Dispense: 6 tablet; Refill: 0  11. Mixed hyperlipidemia  - Lipid panel  12. Thoracic spine pain  - Ambulatory referral to Orthopedic Surgery  13. Neck pain  - Ambulatory referral to Orthopedic Surgery

## 2019-07-13 LAB — COMPLETE METABOLIC PANEL WITH GFR
AG Ratio: 1.8 (calc) (ref 1.0–2.5)
ALT: 27 U/L (ref 6–29)
AST: 23 U/L (ref 10–35)
Albumin: 4.2 g/dL (ref 3.6–5.1)
Alkaline phosphatase (APISO): 97 U/L (ref 37–153)
BUN/Creatinine Ratio: 16 (calc) (ref 6–22)
BUN: 15 mg/dL (ref 7–25)
CO2: 27 mmol/L (ref 20–32)
Calcium: 9.4 mg/dL (ref 8.6–10.4)
Chloride: 105 mmol/L (ref 98–110)
Creat: 0.94 mg/dL — ABNORMAL HIGH (ref 0.60–0.93)
GFR, Est African American: 69 mL/min/{1.73_m2} (ref >=60)
GFR, Est Non African American: 60 mL/min/{1.73_m2} (ref >=60)
Globulin: 2.3 g/dL (calc) (ref 1.9–3.7)
Glucose, Bld: 99 mg/dL (ref 65–99)
Potassium: 4.6 mmol/L (ref 3.5–5.3)
Sodium: 139 mmol/L (ref 135–146)
Total Bilirubin: 0.6 mg/dL (ref 0.2–1.2)
Total Protein: 6.5 g/dL (ref 6.1–8.1)

## 2019-07-13 LAB — LIPID PANEL
Cholesterol: 137 mg/dL (ref <200)
HDL: 38 mg/dL — ABNORMAL LOW (ref >=50)
LDL Cholesterol (Calc): 73 mg/dL (calc)
Non-HDL Cholesterol (Calc): 99 mg/dL (calc) (ref <130)
Total CHOL/HDL Ratio: 3.6 (calc) (ref <5.0)
Triglycerides: 188 mg/dL — ABNORMAL HIGH (ref <150)

## 2019-07-13 LAB — URINE CULTURE
MICRO NUMBER:: 10512875
SPECIMEN QUALITY:: ADEQUATE

## 2019-07-20 ENCOUNTER — Telehealth: Payer: Self-pay

## 2019-07-20 NOTE — Telephone Encounter (Signed)
-----   Message from Steele Sizer, MD sent at 07/19/2019  9:27 PM EDT ----- Lipid panel showed slightly improvement of HDL ( good cholesterol)  Triglycerides is stable, LDL ( bad cholesterol ) is at goal  Sugar, kidney and liver function tests are within normal limits Urine culture negative Protein was elevated on urinalysis. We will need to check urine micro level

## 2019-07-21 ENCOUNTER — Ambulatory Visit (INDEPENDENT_AMBULATORY_CARE_PROVIDER_SITE_OTHER): Payer: Medicare Other

## 2019-07-21 DIAGNOSIS — J449 Chronic obstructive pulmonary disease, unspecified: Secondary | ICD-10-CM

## 2019-07-21 DIAGNOSIS — I25119 Atherosclerotic heart disease of native coronary artery with unspecified angina pectoris: Secondary | ICD-10-CM

## 2019-07-23 ENCOUNTER — Other Ambulatory Visit: Payer: Self-pay

## 2019-07-23 ENCOUNTER — Ambulatory Visit (INDEPENDENT_AMBULATORY_CARE_PROVIDER_SITE_OTHER): Payer: Medicare Other

## 2019-07-23 ENCOUNTER — Encounter (INDEPENDENT_AMBULATORY_CARE_PROVIDER_SITE_OTHER): Payer: Self-pay | Admitting: Vascular Surgery

## 2019-07-23 ENCOUNTER — Ambulatory Visit (INDEPENDENT_AMBULATORY_CARE_PROVIDER_SITE_OTHER): Payer: Medicare Other | Admitting: Vascular Surgery

## 2019-07-23 VITALS — Resp 16 | Wt 163.6 lb

## 2019-07-23 DIAGNOSIS — E782 Mixed hyperlipidemia: Secondary | ICD-10-CM | POA: Diagnosis not present

## 2019-07-23 DIAGNOSIS — I6523 Occlusion and stenosis of bilateral carotid arteries: Secondary | ICD-10-CM | POA: Diagnosis not present

## 2019-07-23 NOTE — Progress Notes (Signed)
MRN : [id number]  Kristie Walters is a 74 y.o. ([birth year]) female who presents with chief complaint of  Chief Complaint  Patient presents with   Follow-up    ultrasound follow up  .  History of Present Illness: Patient returns in follow-up for carotid disease.  She reports no major changes or problems since her last visit from a carotid standpoint.  She has developed atrial fibrillation and is now on Eliquis.  She is about 5 years status post left carotid endarterectomy.  Carotid duplex today shows her left carotid endarterectomy to be patent without recurrent stenosis and stable 1 to 39% right ICA stenosis.  Current Outpatient Medications  Medication Sig Dispense Refill   apixaban (ELIQUIS) 5 MG TABS tablet Take 1 tablet (5 mg total) by mouth 2 (two) times daily. 180 tablet 2   aspirin 81 MG tablet Take 81 mg by mouth daily.     atorvastatin (LIPITOR) 20 MG tablet Take 0.5 tablets (10 mg total) by mouth daily. 90 tablet 2   calcium carbonate (OS-CAL - DOSED IN MG OF ELEMENTAL CALCIUM) 1250 (500 Ca) MG tablet Take 1 tablet by mouth.     cholecalciferol (VITAMIN D) 1000 units tablet Take 1,000 Units by mouth daily.     ezetimibe (ZETIA) 10 MG tablet Take 1 tablet (10 mg total) by mouth daily. 90 tablet 2   Fish Oil-Cholecalciferol (FISH OIL + D3 PO) Take 1,000 mg by mouth 1 day or 1 dose.     metoprolol tartrate (LOPRESSOR) 25 MG tablet Take 1 tablet (25 mg total) by mouth 2 (two) times daily. 180 tablet 2   omeprazole (PRILOSEC) 40 MG capsule Take 40 mg by mouth as needed.      pregabalin (LYRICA) 50 MG capsule Take 1 capsule (50 mg total) by mouth 2 (two) times daily. 180 capsule 1   tiotropium (SPIRIVA HANDIHALER) 18 MCG inhalation capsule Place 1 capsule (18 mcg total) into inhaler and inhale daily. 90 capsule 1   Vitamin D, Ergocalciferol, (DRISDOL) 1.25 MG (50000 UNIT) CAPS capsule Take 50,000 Units by mouth once a week.     ciprofloxacin (CIPRO) 250 MG tablet Take 1  tablet (250 mg total) by mouth 2 (two) times daily. (Patient not taking: Reported on 07/23/2019) 6 tablet 0   No current facility-administered medications for this visit.    Past Medical History:  Diagnosis Date   Allergy    Basal cell carcinoma of nose 08/12/2017   Carotid artery disease (HCC)    s/p L. CEA in July 2016   Centrilobular emphysema ([place]) 09/29/2017   Collagen vascular disease ([place])    Left carotid stenosis   COPD (chronic obstructive pulmonary disease) ([place])    Coronary artery disease 09/29/2017   Noted on chest CT July 2019   Elevated blood pressure (not hypertension)    Fatty liver 09/29/2017   Chest CT July 2019   GERD (gastroesophageal reflux disease)    Hyperlipidemia    Personal history of tobacco use, presenting hazards to health 08/31/2015   PONV (postoperative nausea and vomiting)     Past Surgical History:  Procedure Laterality Date   BREAST BIOPSY     ENDARTERECTOMY Left 08/25/2014   Procedure: ENDARTERECTOMY CAROTID;  Surgeon: Algernon Huxley, MD;  Location: [facility] ORS;  Service: Vascular;  Laterality: Left;   MOHS SURGERY     MOHS SURGERY  09/23/2017   nose   PERIPHERAL VASCULAR CATHETERIZATION N/A 07/20/2014   Procedure: Carotid Angiography;  Surgeon:  Algernon Huxley, MD;  Location: [place] [facility];  Service: Cardiovascular;  Laterality: N/A;   TONSILLECTOMY     TUBAL LIGATION       Social History   Tobacco Use   Smoking status: Former Smoker    Packs/day: 1.00    Years: 55.00    Pack years: 55.00    Types: Cigarettes    Quit date: 11/26/2016    Years since quitting: 2.6   Smokeless tobacco: Current User    Types: Snuff   Tobacco comment: smoking cessation materials not required  Substance Use Topics   Alcohol use: No   Drug use: No      Family History  Problem Relation Age of Onset   Heart attack Mother    Hypercholesterolemia Mother    Hypertension Mother    Peripheral vascular disease Mother    Dementia  Mother    Hypothyroidism Mother    CVA Father    Liver cancer Father    Diabetes Brother    Kidney cancer Sister    Diabetes Brother    Alzheimer's disease Brother    Other Brother        alzheimers   Lymphoma Son    HIV Son    Breast cancer Neg Hx      Allergies  Allergen Reactions   Morphine Nausea Only and Nausea And Vomiting   Morphine And Related Nausea And Vomiting     REVIEW OF SYSTEMS (Negative unless checked)  Constitutional: [] Weight loss  [] Fever  [] Chills Cardiac: [] Chest pain   [] Chest pressure   [x] Palpitations   [] Shortness of breath when laying flat   [] Shortness of breath at rest   [x] Shortness of breath with exertion. Vascular:  [] Pain in legs with walking   [] Pain in legs at rest   [] Pain in legs when laying flat   [] Claudication   [] Pain in feet when walking  [] Pain in feet at rest  [] Pain in feet when laying flat   [] History of DVT   [] Phlebitis   [] Swelling in legs   [] Varicose veins   [] Non-healing ulcers Pulmonary:   [] Uses home oxygen   [] Productive cough   [] Hemoptysis   [] Wheeze  [x] COPD   [] Asthma Neurologic:  [] Dizziness  [] Blackouts   [] Seizures   [] History of stroke   [] History of TIA  [] Aphasia   [] Temporary blindness   [] Dysphagia   [] Weakness or numbness in arms   [] Weakness or numbness in legs Musculoskeletal:  [] Arthritis   [] Joint swelling   [] Joint pain   [] Low back pain Hematologic:  [] Easy bruising  [] Easy bleeding   [] Hypercoagulable state   [] Anemic  [] Hepatitis Gastrointestinal:  [] Blood in stool   [] Vomiting blood  [x] Gastroesophageal reflux/heartburn   [] Difficulty swallowing. Genitourinary:  [] Chronic kidney disease   [] Difficult urination  [] Frequent urination  [] Burning with urination   [] Blood in urine Skin:  [] Rashes   [] Ulcers   [] Wounds Psychological:  [] History of anxiety   []  History of major depression.  Physical Examination  Vitals:   07/23/19 1138  Resp: 16  Weight: 163 lb 9.6 oz (74.2 kg)   Body mass  index is 23.47 kg/m. Gen:  WD/WN, NAD Head: [place]/AT, No temporalis wasting. Ear/Nose/Throat: Hearing grossly intact, nares w/o erythema or drainage, trachea midline Eyes: Conjunctiva clear. Sclera non-icteric Neck: Supple.  No bruit  Pulmonary:  Good air movement, equal and clear to auscultation bilaterally.  Cardiac: RRR, No JVD Vascular:  Vessel Right Left  Radial Palpable Palpable  Musculoskeletal: M/S 5/5 throughout.  No deformity or atrophy. No edema. Neurologic: CN 2-12 intact. Sensation grossly intact in extremities.  Symmetrical.  Speech is fluent. Motor exam as listed above. Psychiatric: Judgment intact, Mood & affect appropriate for pt's clinical situation. Dermatologic: No rashes or ulcers noted.  No cellulitis or open wounds.      CBC Lab Results  Component Value Date   WBC 9.0 03/09/2019   HGB 13.4 03/09/2019   HCT 40.3 03/09/2019   MCV 88 03/09/2019   PLT 217 03/09/2019    BMET    Component Value Date/Time   NA 139 07/12/2019 1112   NA 141 03/09/2019 1053   NA 141 02/20/2014 1016   K 4.6 07/12/2019 1112   K 4.0 02/20/2014 1016   CL 105 07/12/2019 1112   CL 106 02/20/2014 1016   CO2 27 07/12/2019 1112   CO2 31 02/20/2014 1016   GLUCOSE 99 07/12/2019 1112   GLUCOSE 92 02/20/2014 1016   BUN 15 07/12/2019 1112   BUN 14 03/09/2019 1053   BUN 11 02/20/2014 1016   CREATININE 0.94 (H) 07/12/2019 1112   CALCIUM 9.4 07/12/2019 1112   CALCIUM 9.2 02/20/2014 1016   GFRNONAA 60 07/12/2019 1112   GFRAA 69 07/12/2019 1112   Estimated Creatinine Clearance: 56.8 mL/min (A) (by C-G formula based on SCr of 0.94 mg/dL (H)).  COAG Lab Results  Component Value Date   INR 1.0 02/20/2014    Radiology No results found.   Assessment/Plan Hyperlipidemia lipid control important in reducing the progression of atherosclerotic disease. Continue statin therapy   Carotid stenosis Carotid duplex today shows her left carotid endarterectomy to be  patent without recurrent stenosis and stable 1 to 39% right ICA stenosis.  She is doing well.  Continue current medical regimen.  Recheck in 1 year.   Leotis Pain, MD  07/23/2019 11:42 AM    This note was created with Dragon medical transcription system.  Any errors from dictation are purely unintentional

## 2019-07-28 NOTE — Patient Instructions (Addendum)
Thank you for allowing the Chronic Care Management team to participate in your care.   Goals Addressed            This Visit's Progress   . Chronic Disease Management       CARE PLAN ENTRY (see longitudinal plan of care for additional care plan information)  Current Barriers:  . Chronic Disease Management support and education needs related to CAD, COPD and GERD.  Case Manager Clinical Goal(s):  [name] Over the next 120 days, patient will not require hospitalization or emergent care d/t chronic disease related complications. . Over the next 90 days, patient will monitor blood pressure and oxygen saturations routinely and record readings. . Over the next 90 days, patient will take all medications as prescribed. . Over the next 90 days, patient will follow recommended safety measures to prevent falls and injuries. . Over the next 90 days, patient will attend all scheduled medical appointments.   Interventions:  . Inter-disciplinary care team collaboration (see longitudinal plan of care) . Reviewed medications and indications for use. Encouraged to take medication as prescribed. Encouraged to inform provider with concerns regarding prescribed regimen. Encouraged to notify care management team with concerns regarding prescription costs. Reports not taking Spiriva as recommended d/t to concerns that medication will increase her heart rate. Reports taking other prescriptions as prescribed. Denies concerns regarding prescription costs. . Provided information regarding worsening complications related to CAD and COPD along with indications for seeking medical attention. Reports not monitoring blood pressure, pulse or oxygen saturations regularly. Will attempt to monitor and record readings.  . Discussed safety and fall prevention measures. Encouraged to avoid activities that cause overexertion. Considering use of safety alert device.  . Reviewed scheduled/pending provider appointments. Encouraged to  attend appointments as scheduled to prevent delays in care. Encouraged to inform care management team if transportation assistance is needed. . Discussed plans for ongoing care management and follow up. Provided patient with direct contact information for care management team.  Patient Self Care Activities:  . Self administers medications as prescribed . Attends all scheduled provider appointments . Calls pharmacy for medication refills . Performs ADL's independently . Performs IADL's independently   Initial goal documentation        Ms. Mitchelle was given information about Chronic Care Management services including:  1. CCM service includes personalized support from designated clinical staff supervised by her physician, including individualized plan of care and coordination with other care providers 2. 24/7 contact phone numbers for assistance for urgent and routine care needs. 3. Service will only be billed when office clinical staff spend 20 minutes or more in a month to coordinate care. 4. Only one practitioner may furnish and bill the service in a calendar month. 5. The patient may stop CCM services at any time (effective at the end of the month) by phone call to the office staff. 6. The patient will be responsible for cost sharing (co-pay) of up to 20% of the service fee (after annual deductible is met).  Patient agreed to services and verbal consent obtained.     Ms. Guerry verbalized understanding of the instructions provided during the telephonic outreach today. Declined need for mailed/printed instructions.   The care management team will follow-up within the next month.   [facility] [phone]    PLAN The care management team will follow-up with Ms. Tackitt within the next month.

## 2019-07-28 NOTE — Chronic Care Management (AMB) (Signed)
Chronic Care Management   Initial Visit Note   Name: Kristie Walters MRN: [id number] DOB: Feb 22, 1945  Primary Care Provider: Steele Sizer, MD Reason for referral : Chronic Care Management   Kristie Walters is a 74 y.o. year old female who is a primary care patient of Steele Sizer, MD. The CCM team was consulted for assistance with chronic disease management and care coordination. A telephonic assessment was conducted today.  Review of [name] status, including review of consultants reports, relevant labs and test results was conducted today. Collaboration with appropriate care team members was performed as part of the comprehensive evaluation and provision of chronic care management services.    SDOH (Social Determinants of Health) assessments performed: Yes No interventions required at this time.    Medications: Outpatient Encounter Medications as of 07/21/2019  Medication Sig  . apixaban (ELIQUIS) 5 MG TABS tablet Take 1 tablet (5 mg total) by mouth 2 (two) times daily.  [name] aspirin 81 MG tablet Take 81 mg by mouth daily.  [name] atorvastatin (LIPITOR) 20 MG tablet Take 0.5 tablets (10 mg total) by mouth daily.  . calcium carbonate (OS-CAL - DOSED IN MG OF ELEMENTAL CALCIUM) 1250 (500 Ca) MG tablet Take 1 tablet by mouth.  . cholecalciferol (VITAMIN D) 1000 units tablet Take 1,000 Units by mouth daily.  [name] ezetimibe (ZETIA) 10 MG tablet Take 1 tablet (10 mg total) by mouth daily.  [name] Fish Oil-Cholecalciferol (FISH OIL + D3 PO) Take 1,000 mg by mouth 1 day or 1 dose.  . metoprolol tartrate (LOPRESSOR) 25 MG tablet Take 1 tablet (25 mg total) by mouth 2 (two) times daily.  [name] omeprazole (PRILOSEC) 40 MG capsule Take 40 mg by mouth as needed.   . pregabalin (LYRICA) 50 MG capsule Take 1 capsule (50 mg total) by mouth 2 (two) times daily.  . ciprofloxacin (CIPRO) 250 MG tablet Take 1 tablet (250 mg total) by mouth 2 (two) times daily. (Patient not taking: Reported on 07/23/2019)  . tiotropium  (SPIRIVA HANDIHALER) 18 MCG inhalation capsule Place 1 capsule (18 mcg total) into inhaler and inhale daily.  . Vitamin D, Ergocalciferol, (DRISDOL) 1.25 MG (50000 UNIT) CAPS capsule Take 50,000 Units by mouth once a week.   No facility-administered encounter medications on file as of 07/21/2019.     Objective:  BP Readings from Last 3 Encounters:  07/12/19 122/84  04/14/19 122/70  04/13/19 140/76    Lab Results  Component Value Date   CHOL 137 07/12/2019   HDL 38 (L) 07/12/2019   LDLCALC 73 07/12/2019   TRIG 188 (H) 07/12/2019   CHOLHDL 3.6 07/12/2019   Functional Status Survey: Is the patient deaf or have difficulty hearing?: Yes(Reports decreased hearing on right side.) Does the patient have difficulty seeing, even when wearing glasses/contacts?: No Does the patient have difficulty concentrating, remembering, or making decisions?: No Does the patient have difficulty walking or climbing stairs?: Yes(Due to COPD) Does the patient have difficulty dressing or bathing?: No Does the patient have difficulty doing errands alone such as visiting a doctor's office or shopping?: No    Fall Risk  07/21/2019 07/12/2019 04/14/2019 03/11/2019 07/31/2018  Falls in the past year? 0 0 0 0 0  Comment - - - - -  Number falls in past yr: - 0 0 0 0  Injury with Fall? - 0 0 0 0  Risk for fall due to : - - - - -  Risk for fall due to: Comment - - - - -  Follow up Falls prevention discussed Falls evaluation completed - - Falls prevention discussed    Goals Addressed            This Visit's Progress   . Chronic Disease Management       CARE PLAN ENTRY (see longitudinal plan of care for additional care plan information)  Current Barriers:  . Chronic Disease Management support and education needs related to CAD, COPD and GERD.  Case Manager Clinical Goal(s):  [name] Over the next 120 days, patient will not require hospitalization or emergent care d/t chronic disease related complications. . Over the  next 90 days, patient will monitor blood pressure and oxygen saturations routinely and record readings. . Over the next 90 days, patient will take all medications as prescribed. . Over the next 90 days, patient will follow recommended safety measures to prevent falls and injuries. . Over the next 90 days, patient will attend all scheduled medical appointments.   Interventions:  . Inter-disciplinary care team collaboration (see longitudinal plan of care) . Reviewed medications and indications for use. Encouraged to take medication as prescribed. Encouraged to inform provider with concerns regarding prescribed regimen. Encouraged to notify care management team with concerns regarding prescription costs. Reports not taking Spiriva as recommended d/t to concerns that medication will increase her heart rate. Reports taking other prescriptions as prescribed. Denies concerns regarding prescription costs. . Provided information regarding worsening complications related to CAD and COPD along with indications for seeking medical attention. Reports not monitoring blood pressure, pulse or oxygen saturations regularly. Will attempt to monitor and record readings.  . Discussed safety and fall prevention measures. Encouraged to avoid activities that cause overexertion. Considering use of safety alert device.  . Reviewed scheduled/pending provider appointments. Encouraged to attend appointments as scheduled to prevent delays in care. Encouraged to inform care management team if transportation assistance is needed. . Discussed plans for ongoing care management and follow up. Provided patient with direct contact information for care management team.  Patient Self Care Activities:  . Self administers medications as prescribed . Attends all scheduled provider appointments . Calls pharmacy for medication refills . Performs ADL's independently . Performs IADL's independently   Initial goal documentation          [name] was given information about Chronic Care Management services  including:  1. CCM service includes personalized support from designated clinical staff supervised by her physician, including individualized plan of care and coordination with other care providers 2. 24/7 contact phone numbers for assistance for urgent and routine care needs. 3. Service will only be billed when office clinical staff spend 20 minutes or more in a month to coordinate care. 4. Only one practitioner may furnish and bill the service in a calendar month. 5. The patient may stop CCM services at any time (effective at the end of the month) by phone call to the office staff. 6. The patient will be responsible for cost sharing (co-pay) of up to 20% of the service fee (after annual deductible is met).  Patient agreed to services and verbal consent obtained.     PLAN The care management team will follow-up with Ms. Truman within the next month.  [facility] [phone]

## 2019-08-04 ENCOUNTER — Other Ambulatory Visit (HOSPITAL_COMMUNITY): Payer: Self-pay | Admitting: Orthopedic Surgery

## 2019-08-04 ENCOUNTER — Other Ambulatory Visit: Payer: Self-pay | Admitting: Orthopedic Surgery

## 2019-08-04 DIAGNOSIS — M5030 Other cervical disc degeneration, unspecified cervical region: Secondary | ICD-10-CM

## 2019-08-04 DIAGNOSIS — M4302 Spondylolysis, cervical region: Secondary | ICD-10-CM

## 2019-08-04 DIAGNOSIS — S22000A Wedge compression fracture of unspecified thoracic vertebra, initial encounter for closed fracture: Secondary | ICD-10-CM

## 2019-08-04 DIAGNOSIS — M5412 Radiculopathy, cervical region: Secondary | ICD-10-CM

## 2019-08-05 ENCOUNTER — Other Ambulatory Visit: Payer: Self-pay

## 2019-08-05 ENCOUNTER — Ambulatory Visit (INDEPENDENT_AMBULATORY_CARE_PROVIDER_SITE_OTHER): Payer: Medicare Other

## 2019-08-05 VITALS — BP 110/68 | HR 63 | Temp 96.9°F | Resp 16 | Ht 70.0 in | Wt 163.9 lb

## 2019-08-05 DIAGNOSIS — Z0000 Encounter for general adult medical examination without abnormal findings: Secondary | ICD-10-CM | POA: Diagnosis not present

## 2019-08-05 NOTE — Patient Instructions (Signed)
[name] , Thank you for taking time to come for your Medicare Wellness Visit. I appreciate your ongoing commitment to your health goals. Please review the following plan we discussed and let me know if I can assist you in the future.   Screening recommendations/referrals: Colonoscopy: done 06/23/12 Mammogram: done 03/16/19 Bone Density: done 09/10/17 Recommended yearly ophthalmology/optometry visit for glaucoma screening and checkup Recommended yearly dental visit for hygiene and checkup  Vaccinations: Influenza vaccine: done 10/01/18 Pneumococcal vaccine: done 11/18/16 Tdap vaccine: due Shingles vaccine: Shingrix discussed. Please contact your pharmacy for coverage information.  Covid-19:done 04/21/19 & 05/12/19  Conditions/risks identified: Recommend increasing physical activity as tolerated  Next appointment: Follow up in one year for your annual wellness visit    Preventive Care 65 Years and Older, Female Preventive care refers to lifestyle choices and visits with your health care provider that can promote health and wellness. What does preventive care include?  A yearly physical exam. This is also called an annual well check.  Dental exams once or twice a year.  Routine eye exams. Ask your health care provider how often you should have your eyes checked.  Personal lifestyle choices, including:  Daily care of your teeth and gums.  Regular physical activity.  Eating a healthy diet.  Avoiding tobacco and drug use.  Limiting alcohol use.  Practicing safe sex.  Taking low-dose aspirin every day.  Taking vitamin and mineral supplements as recommended by your health care provider. What happens during an annual well check? The services and screenings done by your health care provider during your annual well check will depend on your age, overall health, lifestyle risk factors, and family history of disease. Counseling  Your health care provider may ask you questions about  your:  Alcohol use.  Tobacco use.  Drug use.  Emotional well-being.  Home and relationship well-being.  Sexual activity.  Eating habits.  History of falls.  Memory and ability to understand (cognition).  Work and work Statistician.  Reproductive health. Screening  You may have the following tests or measurements:  Height, weight, and BMI.  Blood pressure.  Lipid and cholesterol levels. These may be checked every 5 years, or more frequently if you are over 74 years old.  Skin check.  Lung cancer screening. You may have this screening every year starting at age 74 if you have a 30-pack-year history of smoking and currently smoke or have quit within the past 15 years.  Fecal occult blood test (FOBT) of the stool. You may have this test every year starting at age 74.  Flexible sigmoidoscopy or colonoscopy. You may have a sigmoidoscopy every 5 years or a colonoscopy every 10 years starting at age 74.  Hepatitis C blood test.  Hepatitis B blood test.  Sexually transmitted disease (STD) testing.  Diabetes screening. This is done by checking your blood sugar (glucose) after you have not eaten for a while (fasting). You may have this done every 1-3 years.  Bone density scan. This is done to screen for osteoporosis. You may have this done starting at age 74.  Mammogram. This may be done every 1-2 years. Talk to your health care provider about how often you should have regular mammograms. Talk with your health care provider about your test results, treatment options, and if necessary, the need for more tests. Vaccines  Your health care provider may recommend certain vaccines, such as:  Influenza vaccine. This is recommended every year.  Tetanus, diphtheria, and acellular pertussis (Tdap, Td)  vaccine. You may need a Td booster every 10 years.  Zoster vaccine. You may need this after age 74.  Pneumococcal 13-valent conjugate (PCV13) vaccine. One dose is recommended  after age 74.  Pneumococcal polysaccharide (PPSV23) vaccine. One dose is recommended after age 74. Talk to your health care provider about which screenings and vaccines you need and how often you need them. This information is not intended to replace advice given to you by your health care provider. Make sure you discuss any questions you have with your health care provider. Document Released: 03/03/2015 Document Revised: 10/25/2015 Document Reviewed: 12/06/2014 Elsevier Interactive Patient Education  2017 [place] Prevention in the Home Falls can cause injuries. They can happen to people of all ages. There are many things you can do to make your home safe and to help prevent falls. What can I do on the outside of my home?  Regularly fix the edges of walkways and driveways and fix any cracks.  Remove anything that might make you trip as you walk through a door, such as a raised step or threshold.  Trim any bushes or trees on the path to your home.  Use bright outdoor lighting.  Clear any walking paths of anything that might make someone trip, such as rocks or tools.  Regularly check to see if handrails are loose or broken. Make sure that both sides of any steps have handrails.  Any raised decks and porches should have guardrails on the edges.  Have any leaves, snow, or ice cleared regularly.  Use sand or salt on walking paths during winter.  Clean up any spills in your garage right away. This includes oil or grease spills. What can I do in the bathroom?  Use night lights.  Install grab bars by the toilet and in the tub and shower. Do not use towel bars as grab bars.  Use non-skid mats or decals in the tub or shower.  If you need to sit down in the shower, use a plastic, non-slip stool.  Keep the floor dry. Clean up any water that spills on the floor as soon as it happens.  Remove soap buildup in the tub or shower regularly.  Attach bath mats securely with  double-sided non-slip rug tape.  Do not have throw rugs and other things on the floor that can make you trip. What can I do in the bedroom?  Use night lights.  Make sure that you have a light by your bed that is easy to reach.  Do not use any sheets or blankets that are too big for your bed. They should not hang down onto the floor.  Have a firm chair that has side arms. You can use this for support while you get dressed.  Do not have throw rugs and other things on the floor that can make you trip. What can I do in the kitchen?  Clean up any spills right away.  Avoid walking on wet floors.  Keep items that you use a lot in easy-to-reach places.  If you need to reach something above you, use a strong step stool that has a grab bar.  Keep electrical cords out of the way.  Do not use floor polish or wax that makes floors slippery. If you must use wax, use non-skid floor wax.  Do not have throw rugs and other things on the floor that can make you trip. What can I do with my stairs?  Do not leave  any items on the stairs.  Make sure that there are handrails on both sides of the stairs and use them. Fix handrails that are broken or loose. Make sure that handrails are as long as the stairways.  Check any carpeting to make sure that it is firmly attached to the stairs. Fix any carpet that is loose or worn.  Avoid having throw rugs at the top or bottom of the stairs. If you do have throw rugs, attach them to the floor with carpet tape.  Make sure that you have a light switch at the top of the stairs and the bottom of the stairs. If you do not have them, ask someone to add them for you. What else can I do to help prevent falls?  Wear shoes that:  Do not have high heels.  Have rubber bottoms.  Are comfortable and fit you well.  Are closed at the toe. Do not wear sandals.  If you use a stepladder:  Make sure that it is fully opened. Do not climb a closed stepladder.  Make  sure that both sides of the stepladder are locked into place.  Ask someone to hold it for you, if possible.  Clearly mark and make sure that you can see:  Any grab bars or handrails.  First and last steps.  Where the edge of each step is.  Use tools that help you move around (mobility aids) if they are needed. These include:  Canes.  Walkers.  Scooters.  Crutches.  Turn on the lights when you go into a dark area. Replace any light bulbs as soon as they burn out.  Set up your furniture so you have a clear path. Avoid moving your furniture around.  If any of your floors are uneven, fix them.  If there are any pets around you, be aware of where they are.  Review your medicines with your doctor. Some medicines can make you feel dizzy. This can increase your chance of falling. Ask your doctor what other things that you can do to help prevent falls. This information is not intended to replace advice given to you by your health care provider. Make sure you discuss any questions you have with your health care provider. Document Released: 12/01/2008 Document Revised: 07/13/2015 Document Reviewed: 03/11/2014 Elsevier Interactive Patient Education  2017 [employer].

## 2019-08-05 NOTE — Progress Notes (Signed)
Subjective:   Kristie Walters is a 74 y.o. female who presents for Medicare Annual (Subsequent) preventive examination.  Review of Systems:   Cardiac Risk Factors include: advanced age (>48men, >74 women);hypertension;dyslipidemia     Objective:     Vitals: BP 110/68   Pulse 63   Temp (!) 96.9 F (36.1 C) (Temporal)   Resp 16   Ht 5\' 10"  (1.778 m)   Wt 163 lb 14.4 oz (74.3 kg)   SpO2 95%   BMI 23.52 kg/m   Body mass index is 23.52 kg/m.  Advanced Directives 08/05/2019 03/06/2019 07/31/2018 07/25/2017 11/27/2016 11/26/2016 11/18/2016  Does Patient Have a Medical Advance Directive? Yes No Yes Yes No Yes Yes  Type of Paramedic of [place];Living will - [place];Living will [place];Living will - Healthcare Power of [place];Living will  Does patient want to make changes to medical advance directive? - - - - - - -  Copy of [place] in Chart? Yes - validated most recent copy scanned in chart (See row information) - Yes - validated most recent copy scanned in chart (See row information) Yes - - No - copy requested  Would patient like information on creating a medical advance directive? - No - Patient declined - - No - Patient declined - -    Tobacco Social History   Tobacco Use  Smoking Status Former Smoker  . Packs/day: 1.00  . Years: 55.00  . Pack years: 55.00  . Types: Cigarettes  . Quit date: 11/26/2016  . Years since quitting: 2.6  Smokeless Tobacco Current User  . Types: Snuff  Tobacco Comment   smoking cessation materials not required     Ready to quit: Not Answered Counseling given: Not Answered Comment: smoking cessation materials not required   Clinical Intake:  Pre-visit preparation completed: Yes  Pain : 0-10 Pain Score: 8  Pain Type: Chronic pain Pain Location: Back (neck) Pain Orientation: Lower Pain Descriptors / Indicators: Aching,  Sore Pain Onset: More than a month ago Pain Frequency: Constant     BMI - recorded: 23.52 Nutritional Status: BMI of 19-24  Normal Nutritional Risks: None Diabetes: No  How often do you need to have someone help you when you read instructions, pamphlets, or other written materials from your doctor or pharmacy?: 1 - Never  Interpreter Needed?: No  Information entered by :: Clemetine Marker LPN  Past Medical History:  Diagnosis Date  . Allergy   . Atrial fibrillation ([place])   . Basal cell carcinoma of nose 08/12/2017  . Carotid artery disease ([place])    s/p L. CEA in July 2016  . Centrilobular emphysema ([place]) 09/29/2017  . Collagen vascular disease ([place])    Left carotid stenosis  . COPD (chronic obstructive pulmonary disease) ([place])   . Coronary artery disease 09/29/2017   Noted on chest CT July 2019  . Elevated blood pressure (not hypertension)   . Fatty liver 09/29/2017   Chest CT July 2019  . GERD (gastroesophageal reflux disease)   . Hyperlipidemia   . Personal history of tobacco use, presenting hazards to health 08/31/2015  . PONV (postoperative nausea and vomiting)    Past Surgical History:  Procedure Laterality Date  . BREAST BIOPSY    . ENDARTERECTOMY Left 08/25/2014   Procedure: ENDARTERECTOMY CAROTID;  Surgeon: Algernon Huxley, MD;  Location: [facility] ORS;  Service: Vascular;  Laterality: Left;  . MOHS SURGERY    .  MOHS SURGERY  09/23/2017   nose  . PERIPHERAL VASCULAR CATHETERIZATION N/A 07/20/2014   Procedure: Carotid Angiography;  Surgeon: Algernon Huxley, MD;  Location: [facility];  Service: Cardiovascular;  Laterality: N/A;  . TONSILLECTOMY    . TUBAL LIGATION     Family History  Problem Relation Age of Onset  . Heart attack Mother   . Hypercholesterolemia Mother   . Hypertension Mother   . Peripheral vascular disease Mother   . Dementia Mother   . Hypothyroidism Mother   . CVA Father   . Liver cancer Father   . Diabetes Brother   . Kidney cancer Sister   .  Diabetes Brother   . Alzheimer's disease Brother   . Other Brother        alzheimers  . Lymphoma Son   . HIV Son   . Breast cancer Neg Hx    Social History   Socioeconomic History  . Marital status: Divorced    Spouse name: Not on file  . Number of children: 2  . Years of education: Not on file  . Highest education level: 10th grade  Occupational History  . Occupation: Retired  Tobacco Use  . Smoking status: Former Smoker    Packs/day: 1.00    Years: 55.00    Pack years: 55.00    Types: Cigarettes    Quit date: 11/26/2016    Years since quitting: 2.6  . Smokeless tobacco: Current User    Types: Snuff  . Tobacco comment: smoking cessation materials not required  Vaping Use  . Vaping Use: Never used  Substance and Sexual Activity  . Alcohol use: No  . Drug use: No  . Sexual activity: Not Currently  Other Topics Concern  . Not on file  Social History Narrative    Pt lives alone   Social Determinants of Health   Financial Resource Strain: Low Risk   . Difficulty of Paying Living Expenses: Not hard at all  Food Insecurity: No Food Insecurity  . Worried About Charity fundraiser in the Last Year: Never true  . Ran Out of Food in the Last Year: Never true  Transportation Needs: No Transportation Needs  . Lack of Transportation (Medical): No  . Lack of Transportation (Non-Medical): No  Physical Activity: Inactive  . Days of Exercise per Week: 0 days  . Minutes of Exercise per Session: 0 min  Stress: No Stress Concern Present  . Feeling of Stress : Not at all  Social Connections: Moderately Isolated  . Frequency of Communication with Friends and Family: More than three times a week  . Frequency of Social Gatherings with Friends and Family: Three times a week  . Attends Religious Services: More than 4 times per year  . Active Member of Clubs or Organizations: No  . Attends Archivist Meetings: Never  . Marital Status: Divorced    Outpatient Encounter  Medications as of 08/05/2019  Medication Sig  . apixaban (ELIQUIS) 5 MG TABS tablet Take 1 tablet (5 mg total) by mouth 2 (two) times daily.  [name] aspirin 81 MG tablet Take 81 mg by mouth daily.  [name] atorvastatin (LIPITOR) 20 MG tablet Take 0.5 tablets (10 mg total) by mouth daily.  . calcium carbonate (OS-CAL - DOSED IN MG OF ELEMENTAL CALCIUM) 1250 (500 Ca) MG tablet Take 1 tablet by mouth.  . cholecalciferol (VITAMIN D) 1000 units tablet Take 1,000 Units by mouth daily.  [name] ezetimibe (ZETIA) 10 MG tablet  Take 1 tablet (10 mg total) by mouth daily.  [name] Fish Oil-Cholecalciferol (FISH OIL + D3 PO) Take 1,000 mg by mouth 1 day or 1 dose.  . metoprolol tartrate (LOPRESSOR) 25 MG tablet Take 1 tablet (25 mg total) by mouth 2 (two) times daily.  [name] omeprazole (PRILOSEC) 40 MG capsule Take 40 mg by mouth as needed.   . pregabalin (LYRICA) 50 MG capsule Take 1 capsule (50 mg total) by mouth 2 (two) times daily.  [name] tiotropium (SPIRIVA HANDIHALER) 18 MCG inhalation capsule Place 1 capsule (18 mcg total) into inhaler and inhale daily.  . [DISCONTINUED] ciprofloxacin (CIPRO) 250 MG tablet Take 1 tablet (250 mg total) by mouth 2 (two) times daily. (Patient not taking: Reported on 07/23/2019)  . [DISCONTINUED] Vitamin D, Ergocalciferol, (DRISDOL) 1.25 MG (50000 UNIT) CAPS capsule Take 50,000 Units by mouth once a week.   No facility-administered encounter medications on file as of 08/05/2019.    Activities of Daily Living In your present state of health, do you have any difficulty performing the following activities: 08/05/2019 07/21/2019  Hearing? Tempie Donning  Comment declines hearing aids Reports decreased hearing on right side.  Vision? N N  Difficulty concentrating or making decisions? N N  Walking or climbing stairs? Y Y  Comment - Due to COPD  Dressing or bathing? N N  Doing errands, shopping? N N  Preparing Food and eating ? N -  Using the Toilet? N -  In the past six months, have you accidently leaked urine? N -   Do you have problems with loss of bowel control? N -  Managing your Medications? N -  Managing your Finances? N -  Housekeeping or managing your Housekeeping? N -  Some recent data might be hidden    Patient Care Team: Steele Sizer, MD as PCP - General (Family Medicine) Rockey Situ Kathlene November, MD as PCP - Cardiology (Cardiology) Dew, Erskine Squibb, MD as Consulting Physician (Vascular Surgery) Jamelle Rushing, MD as Referring Physician (Dermatology) Minna Merritts, MD as Consulting Physician (Cardiology) Neldon Labella, RN as Case Manager    Assessment:   This is a routine wellness examination for [place].  Exercise Activities and Dietary recommendations Current Exercise Habits: The patient does not participate in regular exercise at present, Exercise limited by: cardiac condition(s);respiratory conditions(s);orthopedic condition(s)  Goals    . Chronic Disease Management     CARE PLAN ENTRY (see longitudinal plan of care for additional care plan information)  Current Barriers:  . Chronic Disease Management support and education needs related to CAD, COPD and GERD.  Case Manager Clinical Goal(s):  [name] Over the next 120 days, patient will not require hospitalization or emergent care d/t chronic disease related complications. . Over the next 90 days, patient will monitor blood pressure and oxygen saturations routinely and record readings. . Over the next 90 days, patient will take all medications as prescribed. . Over the next 90 days, patient will follow recommended safety measures to prevent falls and injuries. . Over the next 90 days, patient will attend all scheduled medical appointments.   Interventions:  . Inter-disciplinary care team collaboration (see longitudinal plan of care) . Reviewed medications and indications for use. Encouraged to take medication as prescribed. Encouraged to inform provider with concerns regarding prescribed regimen. Encouraged to notify care  management team with concerns regarding prescription costs. Reports not taking Spiriva as recommended d/t to concerns that medication will increase her heart rate. Reports taking other prescriptions as prescribed. Denies concerns  regarding prescription costs. . Provided information regarding worsening complications related to CAD and COPD along with indications for seeking medical attention. Reports not monitoring blood pressure, pulse or oxygen saturations regularly. Will attempt to monitor and record readings.  . Discussed safety and fall prevention measures. Encouraged to avoid activities that cause overexertion. Considering use of safety alert device.  . Reviewed scheduled/pending provider appointments. Encouraged to attend appointments as scheduled to prevent delays in care. Encouraged to inform care management team if transportation assistance is needed. . Discussed plans for ongoing care management and follow up. Provided patient with direct contact information for care management team.  Patient Self Care Activities:  . Self administers medications as prescribed . Attends all scheduled provider appointments . Calls pharmacy for medication refills . Performs ADL's independently . Performs IADL's independently   Initial goal documentation     . DIET - INCREASE WATER INTAKE     Recommend to drink at least 6-8 8oz glasses of water per day.    . Exercise 3x per week (30 min per time)     Recommend walking 3 times per week for at least 30 minutes       Fall Risk Fall Risk  08/05/2019 07/21/2019 07/12/2019 04/14/2019 03/11/2019  Falls in the past year? 0 0 0 0 0  Comment - - - - -  Number falls in past yr: 0 - 0 0 0  Injury with Fall? 0 - 0 0 0  Risk for fall due to : Impaired balance/gait - - - -  Risk for fall due to: Comment - - - - -  Follow up Falls prevention discussed Falls prevention discussed Falls evaluation completed - -   FALL RISK PREVENTION PERTAINING TO THE HOME:  Any  stairs in or around the home? Yes  If so, are there any without handrails? Yes   Home free of loose throw rugs in walkways, pet beds, electrical cords, etc? Yes  Adequate lighting in your home to reduce risk of falls? Yes   ASSISTIVE DEVICES UTILIZED TO PREVENT FALLS:  Life alert? No  Use of a cane, walker or w/c? No  Grab bars in the bathroom? Yes  Shower chair or bench in shower? No  Elevated toilet seat or a handicapped toilet? No  DME ORDERS:  DME order needed?  No   TIMED UP AND GO:  Was the test performed? Yes .  Length of time to ambulate 10 feet: 6 sec.   GAIT:  Appearance of gait: Gait steady and fast without the use of an assistive device.   Education: Fall risk prevention has been discussed.  Intervention(s) required? No     Depression Screen PHQ 2/9 Scores 08/05/2019 07/21/2019 07/12/2019 04/14/2019  PHQ - 2 Score 0 0 0 0  PHQ- 9 Score - - 0 0     Cognitive Function     6CIT Screen 08/05/2019 07/31/2018 07/25/2017 11/18/2016  What Year? 0 points 0 points 0 points 0 points  What month? 0 points 0 points 0 points 0 points  What time? 0 points 0 points 0 points 0 points  Count back from 20 0 points 0 points 0 points 0 points  Months in reverse 0 points 0 points 0 points 0 points  Repeat phrase 2 points 0 points 0 points 0 points  Total Score 2 0 0 0    Immunization History  Administered Date(s) Administered  . Influenza, High Dose Seasonal PF 10/08/2016, 10/01/2018  . Influenza-Unspecified 11/19/2014, 11/16/2015, 10/08/2016,  09/18/2017  . PFIZER SARS-COV-2 Vaccination 04/21/2019, 05/12/2019  . Pneumococcal Conjugate-13 11/18/2016  . Pneumococcal Polysaccharide-23 06/11/2012  . Td 02/18/2009    Qualifies for Shingles Vaccine? Yes . Due for Shingrix. Education has been provided regarding the importance of this vaccine. Pt has been advised to call insurance company to determine out of pocket expense. Advised may also receive vaccine at local pharmacy or Health  Dept. Verbalized acceptance and understanding.  Tdap: Although this vaccine is not a covered service during a Wellness Exam, does the patient still wish to receive this vaccine today?  No .  Education has been provided regarding the importance of this vaccine. Advised may receive this vaccine at local pharmacy or Health Dept. Aware to provide a copy of the vaccination record if obtained from local pharmacy or Health Dept. Verbalized acceptance and understanding.  Flu Vaccine: Up to date  Pneumococcal Vaccine: Up to date  Covid-19 Vaccine: Up to date   Screening Tests Health Maintenance  Topic Date Due  . TETANUS/TDAP  04/13/2020 (Originally 02/19/2019)  . INFLUENZA VACCINE  09/19/2019  . MAMMOGRAM  03/15/2020  . COLONOSCOPY  06/24/2022  . DEXA SCAN  Completed  . COVID-19 Vaccine  Completed  . Hepatitis C Screening  Completed  . PNA vac Low Risk Adult  Completed    Cancer Screenings:  Colorectal Screening: Completed 06/23/12. Repeat every 10 years;   Mammogram: Completed 03/16/19. Repeat every year;   Bone Density: Completed 09/10/17. Results reflect OSTEOPOROSIS. Repeat every 2 years. Ordered by endocrinology. Started Reclast last year.   Lung Cancer Screening: (Low Dose CT Chest recommended if Age 55-80 years, 30 pack-year currently smoking OR have quit w/in 15years.) does qualify. Completed 09/16/18.   Additional Screening:  Hepatitis C Screening: does qualify; Completed 11/18/16  Vision Screening: Recommended annual ophthalmology exams for early detection of glaucoma and other disorders of the eye. Is the patient up to date with their annual eye exam?  No  Who is the provider or what is the name of the office in which the pt attends annual eye exams? Not established If pt is not established with a provider, would they like to be referred to a provider to establish care? No .   Dental Screening: Recommended annual dental exams for proper oral hygiene  Community Resource  Referral:  CRR required this visit?  No       Plan:     I have personally reviewed and addressed the Medicare Annual Wellness questionnaire and have noted the following in the patient's chart:  A. Medical and social history B. Use of alcohol, tobacco or illicit drugs  C. Current medications and supplements D. Functional ability and status E.  Nutritional status F.  Physical activity G. Advance directives H. List of other physicians I.  Hospitalizations, surgeries, and ER visits in previous 12 months J.  [place] such as hearing and vision if needed, cognitive and depression L. Referrals and appointments   In addition, I have reviewed and discussed with patient certain preventive protocols, quality metrics, and best practice recommendations. A written personalized care plan for preventive services as well as general preventive health recommendations were provided to patient.   Signed,  Clemetine Marker, LPN Nurse Health Advisor   Nurse Notes: pt seen by ortho yesterday for back pain and scheduled for MRI 08/19/19.

## 2019-08-09 ENCOUNTER — Ambulatory Visit: Payer: Self-pay

## 2019-08-09 DIAGNOSIS — I25119 Atherosclerotic heart disease of native coronary artery with unspecified angina pectoris: Secondary | ICD-10-CM

## 2019-08-09 DIAGNOSIS — J449 Chronic obstructive pulmonary disease, unspecified: Secondary | ICD-10-CM

## 2019-08-09 DIAGNOSIS — I480 Paroxysmal atrial fibrillation: Secondary | ICD-10-CM | POA: Diagnosis not present

## 2019-08-09 NOTE — Chronic Care Management (AMB) (Signed)
Chronic Care Management   Follow Up Note   08/09/2019 Name: Kristie Walters MRN: [id number] DOB: [birth year]  Primary Care Provider: Steele Sizer, MD Reason for referral : Chronic Care Management   Kristie Walters is a 74 y.o. year old female who is a primary care patient of Kristie Sizer, MD. She is currently engaged with the chronic care management team. A routine outreach was conducted today.  Review of Kristie Walters  status, including review of consultants reports, relevant labs and test results was conducted today. Collaboration with appropriate care team members was performed as part of the comprehensive evaluation and provision of chronic care management services.    SDOH (Social Determinants of Health) assessments performed: No     Outpatient Encounter Medications as of 08/09/2019  Medication Sig  . apixaban (ELIQUIS) 5 MG TABS tablet Take 1 tablet (5 mg total) by mouth 2 (two) times daily.  [name] aspirin 81 MG tablet Take 81 mg by mouth daily.  [name] atorvastatin (LIPITOR) 20 MG tablet Take 0.5 tablets (10 mg total) by mouth daily.  . calcium carbonate (OS-CAL - DOSED IN MG OF ELEMENTAL CALCIUM) 1250 (500 Ca) MG tablet Take 1 tablet by mouth.  . cholecalciferol (VITAMIN D) 1000 units tablet Take 1,000 Units by mouth daily.  [name] ezetimibe (ZETIA) 10 MG tablet Take 1 tablet (10 mg total) by mouth daily.  [name] Fish Oil-Cholecalciferol (FISH OIL + D3 PO) Take 1,000 mg by mouth 1 day or 1 dose.  . metoprolol tartrate (LOPRESSOR) 25 MG tablet Take 1 tablet (25 mg total) by mouth 2 (two) times daily.  [name] omeprazole (PRILOSEC) 40 MG capsule Take 40 mg by mouth as needed.   . pregabalin (LYRICA) 50 MG capsule Take 1 capsule (50 mg total) by mouth 2 (two) times daily.  [name] tiotropium (SPIRIVA HANDIHALER) 18 MCG inhalation capsule Place 1 capsule (18 mcg total) into inhaler and inhale daily.   No facility-administered encounter medications on file as of 08/09/2019.     Goals Addressed             This Visit's Progress   . Chronic Disease Management       CARE PLAN ENTRY (see longitudinal plan of care for additional care plan information)  Current Barriers:  . Chronic Disease Management support and education needs related to CAD, COPD and GERD.  Case Manager Clinical Goal(s):  [name] Over the next 120 days, patient will not require hospitalization or emergent care d/t chronic disease related complications. . Over the next 90 days, patient will monitor blood pressure and oxygen saturations routinely and record readings. . Over the next 90 days, patient will take all medications as prescribed. . Over the next 90 days, patient will follow recommended safety measures to prevent falls and injuries. . Over the next 90 days, patient will attend all scheduled medical appointments.   Interventions:  . Inter-disciplinary care team collaboration (see longitudinal plan of care) . Reviewed medications. Still prefers not to take Spiriva but reports taking other medications as prescribed.  . Reviewed symptoms and complications related to CAD and COPD along with indications for seeking medical attention. Strongly encouraged to monitor BP, pulse and oxygen saturations regularly. . Reviewed safety measures and activity tolerance.  Reports knees are occasionally sore and stiff but reports ambulating without difficulty. Experiences exertional dyspnea. Denies SOB at rest. Denies changes or decline in activity tolerance.  Reports following recommended safety precautions. Currently using a cane, She is willing to consider using a rollator  walker. Also willing to consider a safety alert device since she lives alone.  . Reviewed scheduled/pending provider appointments. Pending MRI on 08/19/19. Denies concerns regarding transportation.    Patient Self Care Activities:  . Self administers medications as prescribed . Attends scheduled provider appointments . Calls pharmacy for medication refills . Performs ADL's  independently . Performs IADL's independently   Please see past updates related to this goal by clicking on the "Past Updates" button in the selected goal         PLAN The care management team will follow-up with Kristie Walters next month.   [facility] Care Management [phone]

## 2019-08-18 NOTE — Patient Instructions (Signed)
Thank you for allowing the Chronic Care Management team to participate in your care.   Goals Addressed            This Visit's Progress   . Chronic Disease Management       CARE PLAN ENTRY (see longitudinal plan of care for additional care plan information)  Current Barriers:  . Chronic Disease Management support and education needs related to CAD, COPD and GERD.  Case Manager Clinical Goal(s):  [name] Over the next 120 days, patient will not require hospitalization or emergent care d/t chronic disease related complications. . Over the next 90 days, patient will monitor blood pressure and oxygen saturations routinely and record readings. . Over the next 90 days, patient will take all medications as prescribed. . Over the next 90 days, patient will follow recommended safety measures to prevent falls and injuries. . Over the next 90 days, patient will attend all scheduled medical appointments.   Interventions:  . Inter-disciplinary care team collaboration (see longitudinal plan of care) . Reviewed medications. Still prefers not to take Spiriva but reports taking other medications as prescribed.  . Reviewed symptoms and complications related to CAD and COPD along with indications for seeking medical attention. Strongly encouraged to monitor BP, pulse and oxygen saturations regularly. . Reviewed safety measures and activity tolerance.  Reports knees are occasionally sore and stiff but reports ambulating without difficulty. Experiences exertional dyspnea. Denies SOB at rest. Denies changes or decline in activity tolerance.  Reports following recommended safety precautions. Currently using a cane, She is willing to consider using a rollator walker. Also willing to consider a safety alert device since she lives alone.  . Reviewed scheduled/pending provider appointments. Pending MRI on 08/19/19. Denies concerns regarding transportation.    Patient Self Care Activities:  . Self administers  medications as prescribed . Attends scheduled provider appointments . Calls pharmacy for medication refills . Performs ADL's independently . Performs IADL's independently   Please see past updates related to this goal by clicking on the "Past Updates" button in the selected goal        Ms. Rashad verbalized understanding of the instructions provided during the telephonic outreach today. Declined need for printed instructions. Agreed to receive mailed educational documents.   The care management team will follow-up with Ms. Devivo next month.   [facility] Care Management [phone]

## 2019-08-19 ENCOUNTER — Ambulatory Visit
Admission: RE | Admit: 2019-08-19 | Discharge: 2019-08-19 | Disposition: A | Discharge status: Home / Self Care | Payer: Medicare Other | Source: Ambulatory Visit | Attending: Orthopedic Surgery | Admitting: Orthopedic Surgery

## 2019-08-19 ENCOUNTER — Other Ambulatory Visit: Payer: Self-pay

## 2019-08-19 DIAGNOSIS — S22000A Wedge compression fracture of unspecified thoracic vertebra, initial encounter for closed fracture: Secondary | ICD-10-CM

## 2019-08-19 DIAGNOSIS — M4302 Spondylolysis, cervical region: Secondary | ICD-10-CM | POA: Diagnosis present

## 2019-08-19 DIAGNOSIS — M5030 Other cervical disc degeneration, unspecified cervical region: Secondary | ICD-10-CM | POA: Diagnosis not present

## 2019-08-19 DIAGNOSIS — M5412 Radiculopathy, cervical region: Secondary | ICD-10-CM

## 2019-08-27 ENCOUNTER — Ambulatory Visit: Payer: Self-pay

## 2019-08-27 NOTE — Chronic Care Management (AMB) (Signed)
  Chronic Care Management   Note  08/27/2019 Name: RIELLE SCHLAUCH MRN: [id number] DOB: 10/16/1945    Care Coordination Only: Educational documents and resource information mailed.    Follow up plan: The care management team will follow-up with Ms. Cheeks as scheduled later this month.   [facility] Care Management [phone]

## 2019-09-03 ENCOUNTER — Telehealth: Payer: Self-pay

## 2019-09-03 DIAGNOSIS — Z87891 Personal history of nicotine dependence: Secondary | ICD-10-CM

## 2019-09-03 DIAGNOSIS — Z122 Encounter for screening for malignant neoplasm of respiratory organs: Secondary | ICD-10-CM

## 2019-09-03 NOTE — Telephone Encounter (Signed)
Contacted patient to schedule annual lung screening CT scan.  Patient is agreeable and we have scheduled scan for July 29 at 10:15.  Patient answers she has had her COVID vaccines in March. She knows the location of the scan appointment (as she has had one in the past).

## 2019-09-06 NOTE — Addendum Note (Signed)
Addended by: Lieutenant Diego on: 09/06/2019 01:55 PM   Modules accepted: Orders

## 2019-09-06 NOTE — Telephone Encounter (Signed)
Smoking history: former, quit 2019, 56 pack year

## 2019-09-13 ENCOUNTER — Ambulatory Visit (INDEPENDENT_AMBULATORY_CARE_PROVIDER_SITE_OTHER): Payer: Medicare Other

## 2019-09-13 DIAGNOSIS — J449 Chronic obstructive pulmonary disease, unspecified: Secondary | ICD-10-CM

## 2019-09-13 DIAGNOSIS — I25119 Atherosclerotic heart disease of native coronary artery with unspecified angina pectoris: Secondary | ICD-10-CM

## 2019-09-13 NOTE — Chronic Care Management (AMB) (Signed)
Chronic Care Management   Follow Up Note   09/13/2019 Name: Kristie Walters MRN: [id number] DOB: 1945-04-14  Primary Care Provider: Steele Sizer, MD Reason for referral : Chronic Care Management   Kristie Walters is a 74 y.o. year old female who is a primary care patient of Steele Sizer, MD. She is currently engaged with the chronic care management team. A routine telephonic outreach was conducted today.  Review of Kristie Walters status, including review of consultants reports, relevant labs and test results was conducted today. Collaboration with appropriate care team members was performed as part of the comprehensive evaluation and provision of chronic care management services.    SDOH (Social Determinants of Health) assessments performed: No    Outpatient Encounter Medications as of 09/13/2019  Medication Sig  . apixaban (ELIQUIS) 5 MG TABS tablet Take 1 tablet (5 mg total) by mouth 2 (two) times daily.  [name] aspirin 81 MG tablet Take 81 mg by mouth daily.  [name] atorvastatin (LIPITOR) 20 MG tablet Take 0.5 tablets (10 mg total) by mouth daily.  . calcium carbonate (OS-CAL - DOSED IN MG OF ELEMENTAL CALCIUM) 1250 (500 Ca) MG tablet Take 1 tablet by mouth.  . cholecalciferol (VITAMIN D) 1000 units tablet Take 1,000 Units by mouth daily.  [name] ezetimibe (ZETIA) 10 MG tablet Take 1 tablet (10 mg total) by mouth daily.  [name] Fish Oil-Cholecalciferol (FISH OIL + D3 PO) Take 1,000 mg by mouth 1 day or 1 dose.  . metoprolol tartrate (LOPRESSOR) 25 MG tablet Take 1 tablet (25 mg total) by mouth 2 (two) times daily.  [name] omeprazole (PRILOSEC) 40 MG capsule Take 40 mg by mouth as needed.   . pregabalin (LYRICA) 50 MG capsule Take 1 capsule (50 mg total) by mouth 2 (two) times daily.  [name] tiotropium (SPIRIVA HANDIHALER) 18 MCG inhalation capsule Place 1 capsule (18 mcg total) into inhaler and inhale daily.   No facility-administered encounter medications on file as of 09/13/2019.      Goals Addressed              This Visit's Progress   . Chronic Disease Management       CARE PLAN ENTRY (see longitudinal plan of care for additional care plan information)  Current Barriers:  . Chronic Disease Management support and education needs related to CAD, COPD and GERD.  Case Manager Clinical Goal(s):  [name] Over the next 120 days, patient will not require hospitalization or emergent care d/t chronic disease related complications. . Over the next 90 days, patient will monitor blood pressure and oxygen saturations routinely and record readings. . Over the next 90 days, patient will take all medications as prescribed. . Over the next 90 days, patient will follow recommended safety measures to prevent falls and injuries. . Over the next 90 days, patient will attend all scheduled medical appointments.   Interventions:  . Inter-disciplinary care team collaboration (see longitudinal plan of care) . Discussed compliance with COPD self-management.plan. She has experienced episodes of shortness of breath. Reports not being comfortable using her Spiriva inhaler d/t the possible side effects. She used a  ProAir inhaler in the past and prefers to change inhalers. She plans to discuss this during her PCP  visit on 09/15/19. She does not require supplemental oxygen. Reports oxygen saturations of 95-97%.  . Reviewed recent BP readings. Attempting to monitor consistently. Reports a systolic range from 433'I to 120's. Reports diastolic readings range from the 60's to low 70's. Reports taking medications  as prescribed.  . Discussed plan r/t degenerative disc disease.   Completed MRI as scheduled on 08/19/19.  Order for Physical Therapy was submitted by the Ortho team. Anticipates weekly sessions until a long term plan is discussed. Attended the first PT session last week. Next session scheduled for 09/17/19.  . Reviewed plan for ongoing care management and follow-up. Reports decreased activity d/t weakness and fatigue for  several weeks. Denies chest pain or palpitations. Denies episodes of lightheadedness or dizziness. Denies recent falls. She remains independent in the home and denies need for assistance with ADLs. She is aware of indications for seeking immediate medical attention if her condition worsens. She is scheduled for a visit with her PCP on 09/15/19.   Patient Self Care Activities:  . Self administers medications as prescribed . Attends scheduled provider appointments . Calls pharmacy for medication refills . Performs ADL's independently . Performs IADL's independently   Please see past updates related to this goal by clicking on the "Past Updates" button in the selected goal         PLAN The care management team will reach follow-up with Kristie Walters later this week.   [facility] [phone]

## 2019-09-15 ENCOUNTER — Encounter: Payer: Self-pay | Admitting: Family Medicine

## 2019-09-15 ENCOUNTER — Other Ambulatory Visit: Payer: Self-pay

## 2019-09-15 ENCOUNTER — Ambulatory Visit (INDEPENDENT_AMBULATORY_CARE_PROVIDER_SITE_OTHER): Payer: Medicare Other | Admitting: Family Medicine

## 2019-09-15 VITALS — BP 120/70 | HR 71 | Temp 97.1°F | Resp 16 | Ht 70.0 in | Wt 164.5 lb

## 2019-09-15 DIAGNOSIS — G4700 Insomnia, unspecified: Secondary | ICD-10-CM | POA: Diagnosis not present

## 2019-09-15 DIAGNOSIS — R5383 Other fatigue: Secondary | ICD-10-CM

## 2019-09-15 DIAGNOSIS — G629 Polyneuropathy, unspecified: Secondary | ICD-10-CM

## 2019-09-15 DIAGNOSIS — F341 Dysthymic disorder: Secondary | ICD-10-CM

## 2019-09-15 DIAGNOSIS — M810 Age-related osteoporosis without current pathological fracture: Secondary | ICD-10-CM

## 2019-09-15 LAB — CBC WITH DIFFERENTIAL/PLATELET
Absolute Monocytes: 616 cells/uL (ref 200–950)
Basophils Absolute: 70 cells/uL (ref 0–200)
Basophils Relative: 0.9 %
Eosinophils Absolute: 211 cells/uL (ref 15–500)
Eosinophils Relative: 2.7 %
HCT: 40.2 % (ref 35.0–45.0)
Hemoglobin: 13.2 g/dL (ref 11.7–15.5)
Lymphs Abs: 2379 cells/uL (ref 850–3900)
MCH: 29.2 pg (ref 27.0–33.0)
MCHC: 32.8 g/dL (ref 32.0–36.0)
MCV: 88.9 fL (ref 80.0–100.0)
MPV: 10.8 fL (ref 7.5–12.5)
Monocytes Relative: 7.9 %
Neutro Abs: 4524 cells/uL (ref 1500–7800)
Neutrophils Relative %: 58 %
Platelets: 216 10*3/uL (ref 140–400)
RBC: 4.52 10*6/uL (ref 3.80–5.10)
RDW: 12.8 % (ref 11.0–15.0)
Total Lymphocyte: 30.5 %
WBC: 7.8 10*3/uL (ref 3.8–10.8)

## 2019-09-15 LAB — VITAMIN B12: Vitamin B-12: 314 pg/mL (ref 200–1100)

## 2019-09-15 LAB — VITAMIN D 25 HYDROXY (VIT D DEFICIENCY, FRACTURES): Vit D, 25-Hydroxy: 34 ng/mL (ref 30–100)

## 2019-09-15 MED ORDER — DULOXETINE HCL 30 MG PO CPEP: 30.0000 mg | ORAL_CAPSULE | Freq: Every day | ORAL | 0 refills | Status: DC

## 2019-09-15 NOTE — Patient Instructions (Signed)
Thank you for allowing the Chronic Care Management team to participate in your care.  Goals Addressed            This Visit's Progress   . Chronic Disease Management       CARE PLAN ENTRY (see longitudinal plan of care for additional care plan information)  Current Barriers:  . Chronic Disease Management support and education needs related to CAD, COPD and GERD.  Case Manager Clinical Goal(s):  [name] Over the next 120 days, patient will not require hospitalization or emergent care d/t chronic disease related complications. . Over the next 90 days, patient will monitor blood pressure and oxygen saturations routinely and record readings. . Over the next 90 days, patient will take all medications as prescribed. . Over the next 90 days, patient will follow recommended safety measures to prevent falls and injuries. . Over the next 90 days, patient will attend all scheduled medical appointments.   Interventions:  . Inter-disciplinary care team collaboration (see longitudinal plan of care) . Discussed compliance with COPD self-management.plan. She has experienced episodes of shortness of breath. Reports not being comfortable using her Spiriva inhaler d/t the possible side effects. She used a  ProAir inhaler in the past and prefers to change inhalers. She plans to discuss this during her PCP  visit on 09/15/19. She does not require supplemental oxygen. Reports oxygen saturations of 95-97%.  . Reviewed recent BP readings. Attempting to monitor consistently. Reports a systolic range from 852'D to 120's. Reports diastolic readings range from the 60's to low 70's. Reports taking medications as prescribed.  . Discussed plan r/t degenerative disc disease.   Completed MRI as scheduled on 08/19/19.  Order for Physical Therapy was submitted by the Ortho team. Anticipates weekly sessions until a long term plan is discussed. Attended the first PT session last week. Next session scheduled for  09/17/19.  . Reviewed plan for ongoing care management and follow-up. Reports decreased activity d/t weakness and fatigue for several weeks. Denies chest pain or palpitations. Denies episodes of lightheadedness or dizziness. Denies recent falls. She remains independent in the home and denies need for assistance with ADLs. She is aware of indications for seeking immediate medical attention if her condition worsens. She is scheduled for a visit with her PCP on 09/15/19.   Patient Self Care Activities:  . Self administers medications as prescribed . Attends scheduled provider appointments . Calls pharmacy for medication refills . Performs ADL's independently . Performs IADL's independently   Please see past updates related to this goal by clicking on the "Past Updates" button in the selected goal        Ms. Wooden verbalized understanding of the information discussed during the telephonic outreach today. Declined need for a printed/mailed copy of the instructions.   The care management team will follow-up with Ms. Arrellano later this week.   [facility] [phone]

## 2019-09-15 NOTE — Progress Notes (Signed)
Name: Kristie Walters   MRN: [id number]    DOB: [birth year]   Date:09/15/2019       Progress Note  Subjective  Chief Complaint  Chief Complaint  Patient presents with  . Fatigue    Started after she had atrial fibrillation and it is gradually worsening.  . Extremity Weakness    HPI  Fatigue: she has noticed increase in fatigue over the past few weeks, sob with activity and daily pain on her neck, thighs - she states negative evaluation by Dr. Lucky Cowboy. She is getting PT for neck pain - to see if helps with upper arms radiculitis. She would like to hold off on surgery . She has episodes of SOB but is not using Spiriva- advised to resume it. She denies palpitation, but has noticed she is not the same since diagnosed with afib in January.   Dysthmia: she states over the past couple of weeks she has been crying often. She states she has noticed lack of motivation, no energy, feeling tired. Her son has HIV and is getting treated for lymphoma, but he was diagnosed with that 2 years ago. She cried in our office today. She has interrupted sleep, discussed medications but would like to hold off on that. She states her appetite is normal, still able to take care of her house and bills.   Osteoporosis: needs to go back for reclast, has a history of compression fracture spine  Patient Active Problem List   Diagnosis Date Noted  . 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 Fatty liver 09/29/2017  . Coronary artery disease 09/29/2017  . Centrilobular emphysema ([place]) 09/29/2017  . Basal cell carcinoma of nose 08/12/2017  . Estrogen deficiency 08/01/2017  . Aortic atherosclerosis ([place]) 09/14/2015  . Personal history of tobacco use, presenting hazards to health 08/31/2015  . Paresthesia of foot, bilateral 08/15/2015  . Breast mass, right 07/12/2015  . Elevated alkaline phosphatase level 05/29/2015  . COPD (chronic obstructive pulmonary disease) ([place]) 04/26/2015  . GERD (gastroesophageal reflux disease)  04/26/2015  . Hyperlipidemia 04/26/2015  . Hyperglycemia 04/26/2015  . Carotid stenosis 08/25/2014  . H/O malignant neoplasm of skin 08/13/2012    Past Surgical History:  Procedure Laterality Date  . BREAST BIOPSY    . ENDARTERECTOMY Left 08/25/2014   Procedure: ENDARTERECTOMY CAROTID;  Surgeon: Algernon Huxley, MD;  Location: [facility] ORS;  Service: Vascular;  Laterality: Left;  . MOHS SURGERY    . MOHS SURGERY  09/23/2017   nose  . PERIPHERAL VASCULAR CATHETERIZATION N/A 07/20/2014   Procedure: Carotid Angiography;  Surgeon: Algernon Huxley, MD;  Location: [facility];  Service: Cardiovascular;  Laterality: N/A;  . TONSILLECTOMY    . TUBAL LIGATION      Family History  Problem Relation Age of Onset  . Heart attack Mother   . Hypercholesterolemia Mother   . Hypertension Mother   . Peripheral vascular disease Mother   . Dementia Mother   . Hypothyroidism Mother   . CVA Father   . Liver cancer Father   . Diabetes Brother   . Kidney cancer Sister   . Diabetes Brother   . Alzheimer's disease Brother   . Other Brother        alzheimers  . Lymphoma Son   . HIV Son   . Breast cancer Neg Hx     Social History   Tobacco Use  . Smoking status: Former Smoker    Packs/day: 1.00    Years: 55.00  Pack years: 55.00    Types: Cigarettes    Quit date: 11/26/2016    Years since quitting: 2.8  . Smokeless tobacco: Current User    Types: Snuff  . Tobacco comment: smoking cessation materials not required  Substance Use Topics  . Alcohol use: No     Current Outpatient Medications:  .  apixaban (ELIQUIS) 5 MG TABS tablet, Take 1 tablet (5 mg total) by mouth 2 (two) times daily., Disp: 180 tablet, Rfl: 2 .  aspirin 81 MG tablet, Take 81 mg by mouth daily., Disp: , Rfl:  .  atorvastatin (LIPITOR) 20 MG tablet, Take 0.5 tablets (10 mg total) by mouth daily., Disp: 90 tablet, Rfl: 2 .  calcium carbonate (OS-CAL - DOSED IN MG OF ELEMENTAL CALCIUM) 1250 (500 Ca) MG tablet, Take 1 tablet  by mouth., Disp: , Rfl:  .  cholecalciferol (VITAMIN D) 1000 units tablet, Take 1,000 Units by mouth daily., Disp: , Rfl:  .  ezetimibe (ZETIA) 10 MG tablet, Take 1 tablet (10 mg total) by mouth daily., Disp: 90 tablet, Rfl: 2 .  Fish Oil-Cholecalciferol (FISH OIL + D3 PO), Take 1,000 mg by mouth 1 day or 1 dose., Disp: , Rfl:  .  metoprolol tartrate (LOPRESSOR) 25 MG tablet, Take 1 tablet (25 mg total) by mouth 2 (two) times daily., Disp: 180 tablet, Rfl: 2 .  omeprazole (PRILOSEC) 40 MG capsule, Take 40 mg by mouth as needed. , Disp: , Rfl:  .  pregabalin (LYRICA) 50 MG capsule, Take 1 capsule (50 mg total) by mouth 2 (two) times daily., Disp: 180 capsule, Rfl: 1 .  DULoxetine (CYMBALTA) 30 MG capsule, Take 1 capsule (30 mg total) by mouth daily. For one week after that take two daily, Disp: 60 capsule, Rfl: 0 .  tiotropium (SPIRIVA HANDIHALER) 18 MCG inhalation capsule, Place 1 capsule (18 mcg total) into inhaler and inhale daily. (Patient not taking: Reported on 09/15/2019), Disp: 90 capsule, Rfl: 1  Allergies  Allergen Reactions  . Morphine Nausea Only and Nausea And Vomiting  . Morphine And Related Nausea And Vomiting    I personally reviewed active problem list, medication list, allergies, family history, social history with the patient/caregiver today.   ROS  Ten systems reviewed and is negative except as mentioned in HPI   Objective  Vitals:   09/15/19 1035  BP: 120/70  Pulse: 71  Resp: 16  Temp: (!) 97.1 F (36.2 C)  TempSrc: Temporal  SpO2: 99%  Weight: 164 lb 8 oz (74.6 kg)  Height: 5\' 10"  (1.778 m)    Body mass index is 23.6 kg/m.  Physical Exam  Constitutional: Patient appears well-developed and well-nourished. No distress.  HEENT: head atraumatic, normocephalic, pupils equal and reactive to light, neck supple Cardiovascular: Normal rate, regular rhythm and normal heart sounds.  No murmur heard. No BLE edema. Pulmonary/Chest: Effort normal and breath sounds  normal. No respiratory distress. Abdominal: Soft.  There is no tenderness. Muscular skeletal: pain with rom of neck, normal grip  Psychiatric: Patient has a normal mood and affect. behavior is normal. Judgment and thought content normal.  Recent Results (from the past 2160 hour(s))  POCT urinalysis dipstick     Status: Abnormal   Collection Time: 07/12/19 10:06 AM  Result Value Ref Range   Color, UA gold    Clarity, UA cloudy    Glucose, UA Negative Negative   Bilirubin, UA negative    Ketones, UA negative    Spec Grav, UA 1.010 1.010 -  1.025   Blood, UA large    pH, UA 5.0 5.0 - 8.0   Protein, UA Positive (A) Negative   Urobilinogen, UA 0.2 0.2 or 1.0 E.U./dL   Nitrite, UA negative    Leukocytes, UA Large (3+) (A) Negative   Appearance cloudy    Odor none   Lipid panel     Status: Abnormal   Collection Time: 07/12/19 11:12 AM  Result Value Ref Range   Cholesterol 137 <200 mg/dL   HDL 38 (L) > OR = 50 mg/dL   Triglycerides 188 (H) <150 mg/dL   LDL Cholesterol (Calc) 73 mg/dL (calc)    Comment: Reference range: <100 . Desirable range <100 mg/dL for primary prevention;   <70 mg/dL for patients with CHD or diabetic patients  with > or = 2 CHD risk factors. [name] LDL-C is now calculated using the Martin-Hopkins  calculation, which is a validated novel method providing  better accuracy than the Friedewald equation in the  estimation of LDL-C.  Cresenciano Genre et al. Annamaria Helling. 0263;785(88): 2061-2068  (http://education.[employer].com/faq/FAQ164)    Total CHOL/HDL Ratio 3.6 <5.0 (calc)   Non-HDL Cholesterol (Calc) 99 <130 mg/dL (calc)    Comment: For patients with diabetes plus 1 major ASCVD risk  factor, treating to a non-HDL-C goal of <100 mg/dL  (LDL-C of <70 mg/dL) is considered a therapeutic  option.   COMPLETE METABOLIC PANEL WITH GFR     Status: Abnormal   Collection Time: 07/12/19 11:12 AM  Result Value Ref Range   Glucose, Bld 99 65 - 99 mg/dL    Comment: .             Fasting reference interval .    BUN 15 7 - 25 mg/dL   Creat 0.94 (H) 0.60 - 0.93 mg/dL    Comment: For patients >54 years of age, the reference limit for Creatinine is approximately 13% higher for people identified as African-American. .    GFR, Est Non African American 60 > OR = 60 mL/min/1.44m2   GFR, Est African American 69 > OR = 60 mL/min/1.56m2   BUN/Creatinine Ratio 16 6 - 22 (calc)   Sodium 139 135 - 146 mmol/L   Potassium 4.6 3.5 - 5.3 mmol/L   Chloride 105 98 - 110 mmol/L   CO2 27 20 - 32 mmol/L   Calcium 9.4 8.6 - 10.4 mg/dL   Total Protein 6.5 6.1 - 8.1 g/dL   Albumin 4.2 3.6 - 5.1 g/dL   Globulin 2.3 1.9 - 3.7 g/dL (calc)   AG Ratio 1.8 1.0 - 2.5 (calc)   Total Bilirubin 0.6 0.2 - 1.2 mg/dL   Alkaline phosphatase (APISO) 97 37 - 153 U/L   AST 23 10 - 35 U/L   ALT 27 6 - 29 U/L  Urine Culture     Status: None   Collection Time: 07/12/19 11:12 AM  Result Value Ref Range   MICRO NUMBER: [id number]    SPECIMEN QUALITY: Adequate    Sample Source URINE    STATUS: FINAL    ISOLATE 1:      Less than 10,000 CFU/mL of single Gram negative organism isolated. No further testing will be performed. If clinically indicated, recollection using a method to minimize contamination, with prompt transfer to Urine Culture Transport Tube, is recommended.     PHQ2/9: Depression screen [facility] 2/9 09/15/2019 09/15/2019 08/05/2019 07/21/2019 07/12/2019  Decreased Interest 2 0 0 0 0  Down, Depressed, Hopeless 1 0 0 0 0  PHQ -  2 Score 3 0 0 0 0  Altered sleeping 3 0 - - 0  Tired, decreased energy 3 0 - - 0  Change in appetite 0 0 - - 0  Feeling bad or failure about yourself  0 0 - - 0  Trouble concentrating 0 0 - - 0  Moving slowly or fidgety/restless 0 0 - - 0  Suicidal thoughts 0 0 - - 0  PHQ-9 Score 9 0 - - 0  Difficult doing work/chores Not difficult at all - - - Not difficult at all  Some recent data might be hidden    phq 9 is positive   Fall Risk: Fall Risk  09/15/2019 08/05/2019  07/21/2019 07/12/2019 04/14/2019  Falls in the past year? 0 0 0 0 0  Comment - - - - -  Number falls in past yr: 0 0 - 0 0  Injury with Fall? 0 0 - 0 0  Risk for fall due to : - Impaired balance/gait - - -  Risk for fall due to: Comment - - - - -  Follow up - Falls prevention discussed Falls prevention discussed Falls evaluation completed -     Functional Status Survey: Is the patient deaf or have difficulty hearing?: Yes Does the patient have difficulty seeing, even when wearing glasses/contacts?: No Does the patient have difficulty concentrating, remembering, or making decisions?: No Does the patient have difficulty walking or climbing stairs?: Yes Does the patient have difficulty dressing or bathing?: No Does the patient have difficulty doing errands alone such as visiting a doctor's office or shopping?: No    Assessment & Plan  1. Fatigue, unspecified type  - CBC with Differential/Platelet - Vitamin B12 - VITAMIN D 25 Hydroxy (Vit-D Deficiency, Fractures)  2. Dysthymia  - DULoxetine (CYMBALTA) 30 MG capsule; Take 1 capsule (30 mg total) by mouth daily. For one week after that take two daily  Dispense: 60 capsule; Refill: 0  3. Insomnia, unspecified type  Discussed melatonin , she does not want prescription for that yet   4. Neuropathy  - Vitamin B12  5. Osteoporosis, unspecified osteoporosis type, unspecified pathological fracture presence  Needs to resume Reclast

## 2019-09-16 ENCOUNTER — Other Ambulatory Visit: Payer: Self-pay

## 2019-09-16 ENCOUNTER — Ambulatory Visit
Admission: RE | Admit: 2019-09-16 | Discharge: 2019-09-16 | Disposition: A | Discharge status: Home / Self Care | Payer: Medicare Other | Source: Ambulatory Visit | Attending: Oncology | Admitting: Oncology

## 2019-09-16 DIAGNOSIS — Z122 Encounter for screening for malignant neoplasm of respiratory organs: Secondary | ICD-10-CM | POA: Diagnosis present

## 2019-09-16 DIAGNOSIS — Z87891 Personal history of nicotine dependence: Secondary | ICD-10-CM | POA: Diagnosis not present

## 2019-09-17 ENCOUNTER — Telehealth: Payer: Self-pay

## 2019-09-20 ENCOUNTER — Encounter: Payer: Self-pay | Admitting: *Deleted

## 2019-09-23 NOTE — Telephone Encounter (Signed)
°  Chronic Care Management   Outreach Note   Name: Kristie Walters MRN: [id number] DOB: May 16, 1945  Primary Care Provider: Steele Sizer, MD Reason for referral : Chronic Care Management   Brief update with Kristie Walters. Reports doing well today. She was evaluated by Dr. Ancil Boozer on 09/15/19 and will try Spiriva as prescribed. She attended her physical therapy session earlier today without difficulties. Requesting assistance with submitting documents for a disability placard. Will reach out again within the next few weeks to complete the needed documents and obtain her providers signature.   Follow Up Plan:  Will follow up with Kristie Walters within the next few weeks.    [facility] [phone]

## 2019-09-30 ENCOUNTER — Encounter: Payer: Self-pay | Admitting: Family Medicine

## 2019-09-30 ENCOUNTER — Ambulatory Visit (INDEPENDENT_AMBULATORY_CARE_PROVIDER_SITE_OTHER): Payer: Medicare Other | Admitting: Family Medicine

## 2019-09-30 ENCOUNTER — Other Ambulatory Visit: Payer: Self-pay

## 2019-09-30 VITALS — BP 130/80 | HR 63 | Temp 98.2°F | Resp 16 | Ht 70.0 in | Wt 163.1 lb

## 2019-09-30 DIAGNOSIS — I700 Atherosclerosis of aorta: Secondary | ICD-10-CM

## 2019-09-30 DIAGNOSIS — M810 Age-related osteoporosis without current pathological fracture: Secondary | ICD-10-CM

## 2019-09-30 DIAGNOSIS — I480 Paroxysmal atrial fibrillation: Secondary | ICD-10-CM

## 2019-09-30 DIAGNOSIS — R5383 Other fatigue: Secondary | ICD-10-CM | POA: Diagnosis not present

## 2019-09-30 DIAGNOSIS — F341 Dysthymic disorder: Secondary | ICD-10-CM

## 2019-09-30 DIAGNOSIS — R269 Unspecified abnormalities of gait and mobility: Secondary | ICD-10-CM

## 2019-09-30 DIAGNOSIS — G629 Polyneuropathy, unspecified: Secondary | ICD-10-CM

## 2019-09-30 DIAGNOSIS — R7989 Other specified abnormal findings of blood chemistry: Secondary | ICD-10-CM

## 2019-09-30 DIAGNOSIS — I272 Pulmonary hypertension, unspecified: Secondary | ICD-10-CM

## 2019-09-30 DIAGNOSIS — J432 Centrilobular emphysema: Secondary | ICD-10-CM | POA: Diagnosis not present

## 2019-09-30 LAB — TSH: TSH: 3.99 mIU/L (ref 0.40–4.50)

## 2019-09-30 NOTE — Progress Notes (Signed)
Name: Kristie Walters   MRN: [id number]    DOB: Dec 03, 1945   Date:09/30/2019       Progress Note  Subjective  Chief Complaint  Chief Complaint  Patient presents with   Fatigue   Depression    HPI  Paroxysmal Afib: went to [facility] on 03/06/2019 after onset of chest pain, palpitation and SOB, transported by EMS. She was given Metoprolol and cardioverted. Seen by Dr. Rockey Situ on 04/13/2019 and EKG during the visit showed normal sinus rhythm. She has noticed worsening of SOB, fatigue, also has lower extremity edema and orthopnea, she has noticed palpitation almost daily , but only lasts seconds .  Advised follow up with cardiologist   Echo 03/2019 1. Left ventricular ejection fraction, by visual estimation, is 55 to  60%. The left ventricle has normal function. There is no left ventricular  hypertrophy.  2. Left ventricular diastolic parameters are consistent with Grade II  diastolic dysfunction (pseudonormalization).  Mildly elevated pulmonary artery systolic pressure.   CAD/Carotid Artery Stenosis: coronary artery calcium incidentally found on CT but stress test no significant ischemia in 2017,  She has noticed intermittent chest pressure, not associated with nausea or diaphoresis, but she has intermittent nausea  She also has carotid atherosclerosis and sees Dr. Lucky Cowboy - history of left sided CEA. On statin and zetia.   Abnormal mammogram on 02/2019, went back for breast biopsy of left and results negative, repeat in one year. Unchanged   Elevated TSH: last level back to normal, but she is very fatigued and would like to be rechecked   Thoracic spine compression fracture found on CT done 07//2020, she has chronic pain on thoracic spine, it radiates to both side, she is on lyrica and seems to be stable on Lyrica. She will contact endo to go back for another Reclast, she missed her last done that was due 03/2019, she has not contacted Endo yet .   Emphysema: she no longer smokes, she states  she feel sob with activity, she states she has wheezing at night but denies any cough ., she states Spiriva has helped with symptoms but feeling weak and would like to see pulmonologist   Cervical Radiculitis: she states still has paresthesia that goes in the ulnar aspect of right arm, she has pain with rom of her neck, on lyrica helps. She states not a constant problem, about a couple of times a week. She started PT ,she had two sessions so far but has not noticed any improvement   Peripheral neuropathy: she states she has constant tingling and pain on her feet but not severe until the night , Lyrica has helped with pain . She states she has a history of injury on both feet, she has a hot sensation that is intense on top of left foot. She states pain was 4/10, she has gait problems due to paresthesia, discussed walker or cane and she thinks she will start to use a cane  Dysthymia : son lives in [place]. He has HIV and also T-cell lymphoma, she worries about him,last time she came in we gave her duloxetine but she stopped because it made her feel groggy, she states she is feeling much better now and does not want to take any other medication at this time   Patient Active Problem List   Diagnosis Date Noted   Postmenopausal osteoporosis 02/25/2018   Fatty liver 09/29/2017   Coronary artery disease 09/29/2017   Centrilobular emphysema ([place]) 09/29/2017   Basal cell carcinoma  of nose 08/12/2017   Estrogen deficiency 08/01/2017   Aortic atherosclerosis ([place]) 09/14/2015   Personal history of tobacco use, presenting hazards to health 08/31/2015   Paresthesia of foot, bilateral 08/15/2015   Breast mass, right 07/12/2015   Elevated alkaline phosphatase level 05/29/2015   COPD (chronic obstructive pulmonary disease) ([place]) 04/26/2015   GERD (gastroesophageal reflux disease) 04/26/2015   Hyperlipidemia 04/26/2015   Hyperglycemia 04/26/2015   Carotid stenosis 08/25/2014   H/O  malignant neoplasm of skin 08/13/2012    Past Surgical History:  Procedure Laterality Date   BREAST BIOPSY     ENDARTERECTOMY Left 08/25/2014   Procedure: ENDARTERECTOMY CAROTID;  Surgeon: Algernon Huxley, MD;  Location: [facility] ORS;  Service: Vascular;  Laterality: Left;   MOHS SURGERY     MOHS SURGERY  09/23/2017   nose   PERIPHERAL VASCULAR CATHETERIZATION N/A 07/20/2014   Procedure: Carotid Angiography;  Surgeon: Algernon Huxley, MD;  Location: [facility];  Service: Cardiovascular;  Laterality: N/A;   TONSILLECTOMY     TUBAL LIGATION      Family History  Problem Relation Age of Onset   Heart attack Mother    Hypercholesterolemia Mother    Hypertension Mother    Peripheral vascular disease Mother    Dementia Mother    Hypothyroidism Mother    CVA Father    Liver cancer Father    Diabetes Brother    Kidney cancer Sister    Diabetes Brother    Alzheimer's disease Brother    Other Brother        alzheimers   Lymphoma Son    HIV Son    Breast cancer Neg Hx     Social History   Tobacco Use   Smoking status: Former Smoker    Packs/day: 1.00    Years: 55.00    Pack years: 55.00    Types: Cigarettes    Quit date: 11/26/2016    Years since quitting: 2.8   Smokeless tobacco: Current User    Types: Snuff   Tobacco comment: smoking cessation materials not required  Substance Use Topics   Alcohol use: No     Current Outpatient Medications:    apixaban (ELIQUIS) 5 MG TABS tablet, Take 1 tablet (5 mg total) by mouth 2 (two) times daily., Disp: 180 tablet, Rfl: 2   aspirin 81 MG tablet, Take 81 mg by mouth daily., Disp: , Rfl:    atorvastatin (LIPITOR) 20 MG tablet, Take 0.5 tablets (10 mg total) by mouth daily., Disp: 90 tablet, Rfl: 2   calcium carbonate (OS-CAL - DOSED IN MG OF ELEMENTAL CALCIUM) 1250 (500 Ca) MG tablet, Take 1 tablet by mouth., Disp: , Rfl:    cholecalciferol (VITAMIN D) 1000 units tablet, Take 1,000 Units by mouth daily.,  Disp: , Rfl:    ezetimibe (ZETIA) 10 MG tablet, Take 1 tablet (10 mg total) by mouth daily., Disp: 90 tablet, Rfl: 2   Fish Oil-Cholecalciferol (FISH OIL + D3 PO), Take 1,000 mg by mouth 1 day or 1 dose., Disp: , Rfl:    metoprolol tartrate (LOPRESSOR) 25 MG tablet, Take 1 tablet (25 mg total) by mouth 2 (two) times daily., Disp: 180 tablet, Rfl: 2   omeprazole (PRILOSEC) 40 MG capsule, Take 40 mg by mouth as needed. , Disp: , Rfl:    pregabalin (LYRICA) 50 MG capsule, Take 1 capsule (50 mg total) by mouth 2 (two) times daily., Disp: 180 capsule, Rfl: 1   tiotropium (SPIRIVA HANDIHALER) 18 MCG  inhalation capsule, Place 1 capsule (18 mcg total) into inhaler and inhale daily., Disp: 90 capsule, Rfl: 1  Allergies  Allergen Reactions   Morphine Nausea Only and Nausea And Vomiting   Morphine And Related Nausea And Vomiting    I personally reviewed active problem list, medication list, allergies, family history, social history, health maintenance with the patient/caregiver today.   ROS  Constitutional: Negative for fever or weight change.  Respiratory: Negative for cough , positive for  shortness of breath.   Cardiovascular: Negative for chest pain , having more frequent  palpitations.  Gastrointestinal: Negative for abdominal pain, no bowel changes.  Musculoskeletal: positive for gait problem but no joint swelling.  Skin: Negative for rash.  Neurological: Negative for dizziness or headache.  No other specific complaints in a complete review of systems (except as listed in HPI above).  Objective  Vitals:   09/30/19 1205  BP: 130/80  Pulse: 63  Resp: 16  Temp: 98.2 F (36.8 C)  TempSrc: Oral  SpO2: 96%  Weight: 163 lb 1.6 oz (74 kg)  Height: 5\' 10"  (1.778 m)    Body mass index is 23.4 kg/m.  Physical Exam  Constitutional: Patient appears well-developed and well-nourished.No distress.  HEENT: head atraumatic, normocephalic, pupils equal and reactive to light, neck  supple, throat within normal limits Cardiovascular: Normal rate, regular rhythm and normal heart sounds.  No murmur heard. Trace BLE ankle  edema. Pulmonary/Chest: Effort normal and breath sounds normal  No respiratory distress. Abdominal: Soft.  There is no tenderness. Psychiatric: Patient has a normal mood and affect. behavior is normal. Judgment and thought content normal.  Recent Results (from the past 2160 hour(s))  POCT urinalysis dipstick     Status: Abnormal   Collection Time: 07/12/19 10:06 AM  Result Value Ref Range   Color, UA gold    Clarity, UA cloudy    Glucose, UA Negative Negative   Bilirubin, UA negative    Ketones, UA negative    Spec Grav, UA 1.010 1.010 - 1.025   Blood, UA large    pH, UA 5.0 5.0 - 8.0   Protein, UA Positive (A) Negative   Urobilinogen, UA 0.2 0.2 or 1.0 E.U./dL   Nitrite, UA negative    Leukocytes, UA Large (3+) (A) Negative   Appearance cloudy    Odor none   Lipid panel     Status: Abnormal   Collection Time: 07/12/19 11:12 AM  Result Value Ref Range   Cholesterol 137 <200 mg/dL   HDL 38 (L) > OR = 50 mg/dL   Triglycerides 188 (H) <150 mg/dL   LDL Cholesterol (Calc) 73 mg/dL (calc)    Comment: Reference range: <100 . Desirable range <100 mg/dL for primary prevention;   <70 mg/dL for patients with CHD or diabetic patients  with > or = 2 CHD risk factors. [name] LDL-C is now calculated using the Martin-Hopkins  calculation, which is a validated novel method providing  better accuracy than the Friedewald equation in the  estimation of LDL-C.  Cresenciano Genre et al. Annamaria Helling. 4081;448(18): 2061-2068  (http://education.[facility].com/faq/FAQ164)    Total CHOL/HDL Ratio 3.6 <5.0 (calc)   Non-HDL Cholesterol (Calc) 99 <130 mg/dL (calc)    Comment: For patients with diabetes plus 1 major ASCVD risk  factor, treating to a non-HDL-C goal of <100 mg/dL  (LDL-C of <70 mg/dL) is considered a therapeutic  option.   COMPLETE METABOLIC PANEL WITH GFR      Status: Abnormal   Collection Time: 07/12/19  11:12 AM  Result Value Ref Range   Glucose, Bld 99 65 - 99 mg/dL    Comment: .            Fasting reference interval .    BUN 15 7 - 25 mg/dL   Creat 0.94 (H) 0.60 - 0.93 mg/dL    Comment: For patients >14 years of age, the reference limit for Creatinine is approximately 13% higher for people identified as African-American. .    GFR, Est Non African American 60 > OR = 60 mL/min/1.26m2   GFR, Est African American 69 > OR = 60 mL/min/1.73m2   BUN/Creatinine Ratio 16 6 - 22 (calc)   Sodium 139 135 - 146 mmol/L   Potassium 4.6 3.5 - 5.3 mmol/L   Chloride 105 98 - 110 mmol/L   CO2 27 20 - 32 mmol/L   Calcium 9.4 8.6 - 10.4 mg/dL   Total Protein 6.5 6.1 - 8.1 g/dL   Albumin 4.2 3.6 - 5.1 g/dL   Globulin 2.3 1.9 - 3.7 g/dL (calc)   AG Ratio 1.8 1.0 - 2.5 (calc)   Total Bilirubin 0.6 0.2 - 1.2 mg/dL   Alkaline phosphatase (APISO) 97 37 - 153 U/L   AST 23 10 - 35 U/L   ALT 27 6 - 29 U/L  Urine Culture     Status: None   Collection Time: 07/12/19 11:12 AM  Result Value Ref Range   MICRO NUMBER: [id number]    SPECIMEN QUALITY: Adequate    Sample Source URINE    STATUS: FINAL    ISOLATE 1:      Less than 10,000 CFU/mL of single Gram negative organism isolated. No further testing will be performed. If clinically indicated, recollection using a method to minimize contamination, with prompt transfer to Urine Culture Transport Tube, is recommended.  CBC with Differential/Platelet     Status: None   Collection Time: 09/15/19 11:18 AM  Result Value Ref Range   WBC 7.8 3.8 - 10.8 Thousand/uL   RBC 4.52 3.80 - 5.10 Million/uL   Hemoglobin 13.2 11.7 - 15.5 g/dL   HCT 40.2 35 - 45 %   MCV 88.9 80.0 - 100.0 fL   MCH 29.2 27.0 - 33.0 pg   MCHC 32.8 32.0 - 36.0 g/dL   RDW 12.8 11.0 - 15.0 %   Platelets 216 140 - 400 Thousand/uL   MPV 10.8 7.5 - 12.5 fL   Neutro Abs 4,524 1,500 - 7,800 cells/uL   Lymphs Abs 2,379 850 - 3,900 cells/uL    Absolute Monocytes 616 200 - 950 cells/uL   Eosinophils Absolute 211 15 - 500 cells/uL   Basophils Absolute 70 0 - 200 cells/uL   Neutrophils Relative % 58 %   Total Lymphocyte 30.5 %   Monocytes Relative 7.9 %   Eosinophils Relative 2.7 %   Basophils Relative 0.9 %  Vitamin B12     Status: None   Collection Time: 09/15/19 11:18 AM  Result Value Ref Range   Vitamin B-12 314 200 - 1,100 pg/mL    Comment: . Please Note: Although the reference range for vitamin B12 is [phone] pg/mL, it has been reported that between 5 and 10% of patients with values between 200 and 400 pg/mL may experience neuropsychiatric and hematologic abnormalities due to occult B12 deficiency; less than 1% of patients with values above 400 pg/mL will have symptoms. [name]   VITAMIN D 25 Hydroxy (Vit-D Deficiency, Fractures)     Status: None   Collection  Time: 09/15/19 11:18 AM  Result Value Ref Range   Vit D, 25-Hydroxy 34 30 - 100 ng/mL    Comment: Vitamin D Status         25-OH Vitamin D: . Deficiency:                    <20 ng/mL Insufficiency:             20 - 29 ng/mL Optimal:                 > or = 30 ng/mL . For 25-OH Vitamin D testing on patients on  D2-supplementation and patients for whom quantitation  of D2 and D3 fractions is required, the [facility](TM) 25-OH VIT D, (D2,D3), LC/MS/MS is recommended: order  code [id number] (patients >37yrs). See Note 1 . Note 1 . For additional information, please refer to  http://education.[facility].com/faq/FAQ199  (This link is being provided for informational/ educational purposes only.)      PHQ2/9: Depression screen [facility] 2/9 09/30/2019 09/15/2019 09/15/2019 08/05/2019 07/21/2019  Decreased Interest 0 2 0 0 0  Down, Depressed, Hopeless 1 1 0 0 0  PHQ - 2 Score 1 3 0 0 0  Altered sleeping 2 3 0 - -  Tired, decreased energy 0 3 0 - -  Change in appetite 0 0 0 - -  Feeling bad or failure about yourself  0 0 0 - -  Trouble concentrating 0 0 0 - -   Moving slowly or fidgety/restless 0 0 0 - -  Suicidal thoughts 0 0 0 - -  PHQ-9 Score 3 9 0 - -  Difficult doing work/chores - Not difficult at all - - -  Some recent data might be hidden    phq 9 is positive   Fall Risk: Fall Risk  09/30/2019 09/15/2019 08/05/2019 07/21/2019 07/12/2019  Falls in the past year? 0 0 0 0 0  Comment - - - - -  Number falls in past yr: 0 0 0 - 0  Injury with Fall? 0 0 0 - 0  Risk for fall due to : - - Impaired balance/gait - -  Risk for fall due to: Comment - - - - -  Follow up - - Falls prevention discussed Falls prevention discussed Falls evaluation completed      Functional Status Survey: Is the patient deaf or have difficulty hearing?: Yes Does the patient have difficulty seeing, even when wearing glasses/contacts?: No Does the patient have difficulty concentrating, remembering, or making decisions?: No Does the patient have difficulty walking or climbing stairs?: No Does the patient have difficulty dressing or bathing?: No Does the patient have difficulty doing errands alone such as visiting a doctor's office or shopping?: No    Assessment & Plan  1. Dysthymia  Doing better, could not tolerate duloxetine   2. Fatigue, unspecified type  - Ambulatory referral to Pulmonology  3. Paroxysmal atrial fibrillation (HCC)  She will contact Dr. Rockey Situ   4. Age-related osteoporosis without current pathological fracture  Needs to contact endo for reclast   5. Aortic atherosclerosis (HCC)  On stain therapy   6. Centrilobular emphysema (HCC)  - TSH  7. Abnormal TSH  - Ambulatory referral to Pulmonology  9. Neuropathy  Stable   10. Gait difficulty  Handicap form filled out

## 2019-10-09 ENCOUNTER — Other Ambulatory Visit: Payer: Self-pay | Admitting: Family Medicine

## 2019-10-09 DIAGNOSIS — F341 Dysthymic disorder: Secondary | ICD-10-CM

## 2019-10-29 ENCOUNTER — Ambulatory Visit: Payer: Self-pay

## 2019-11-03 ENCOUNTER — Ambulatory Visit (INDEPENDENT_AMBULATORY_CARE_PROVIDER_SITE_OTHER): Payer: Medicare Other

## 2019-11-03 DIAGNOSIS — J449 Chronic obstructive pulmonary disease, unspecified: Secondary | ICD-10-CM

## 2019-11-03 NOTE — Chronic Care Management (AMB) (Signed)
  Chronic Care Management   Note   Name: Kristie Walters MRN: [id number] DOB: April 28, 1945  Brief call to Kristie Walters. She is anticipating a call from the [facility] Pulmonology team.  The care management team will follow-up with Kristie Walters next week.   Cristy Friedlander [facility] Care Management [facility] [phone]

## 2019-11-03 NOTE — Chronic Care Management (AMB) (Signed)
Chronic Care Management   Follow Up Note   11/03/2019 Name: Kristie Walters MRN: [id number] DOB: [birth year]  Primary Care Provider: Steele Sizer, MD Reason for referral : Chronic Care Management   Kristie Walters is a 74 y.o. year old female who is a primary care patient of Steele Sizer, MD. She is engaged with the chronic care management program. A telephonic outreach was conducted today.  Review of Kristie Walters status, including review of consultants reports, relevant labs and test results was conducted today. Collaboration with appropriate care team members was performed as part of the comprehensive evaluation and provision of chronic care management services.    SDOH (Social Determinants of Health) assessments performed: No     Outpatient Encounter Medications as of 11/03/2019  Medication Sig  . apixaban (ELIQUIS) 5 MG TABS tablet Take 1 tablet (5 mg total) by mouth 2 (two) times daily.  [name] aspirin 81 MG tablet Take 81 mg by mouth daily.  [name] atorvastatin (LIPITOR) 20 MG tablet Take 0.5 tablets (10 mg total) by mouth daily.  . calcium carbonate (OS-CAL - DOSED IN MG OF ELEMENTAL CALCIUM) 1250 (500 Ca) MG tablet Take 1 tablet by mouth.  . cholecalciferol (VITAMIN D) 1000 units tablet Take 1,000 Units by mouth daily.  [name] ezetimibe (ZETIA) 10 MG tablet Take 1 tablet (10 mg total) by mouth daily.  [name] Fish Oil-Cholecalciferol (FISH OIL + D3 PO) Take 1,000 mg by mouth 1 day or 1 dose.  . metoprolol tartrate (LOPRESSOR) 25 MG tablet Take 1 tablet (25 mg total) by mouth 2 (two) times daily.  [name] omeprazole (PRILOSEC) 40 MG capsule Take 40 mg by mouth as needed.   . pregabalin (LYRICA) 50 MG capsule Take 1 capsule (50 mg total) by mouth 2 (two) times daily.  [name] tiotropium (SPIRIVA HANDIHALER) 18 MCG inhalation capsule Place 1 capsule (18 mcg total) into inhaler and inhale daily.   No facility-administered encounter medications on file as of 11/03/2019.     Objective:  BP Readings from Last 3  Encounters:  09/30/19 130/80  09/15/19 120/70  08/05/19 110/68    Lab Results  Component Value Date   CHOL 137 07/12/2019   HDL 38 (L) 07/12/2019   LDLCALC 73 07/12/2019   TRIG 188 (H) 07/12/2019   CHOLHDL 3.6 07/12/2019    Goals Addressed            This Visit's Progress   . Chronic Disease Management       CARE PLAN ENTRY (see longitudinal plan of care for additional care plan information)  Current Barriers:  . Chronic Disease Management support and education needs related to CAD, COPD and GERD.  Case Manager Clinical Goal(s):  [name] Over the next 120 days, patient will not require hospitalization or emergent care d/t chronic disease related complications.-Complete . Over the next 90 days, patient will monitor blood pressure and oxygen saturations routinely and record readings.-Complete . Over the next 90 days, patient will take all medications as prescribed.-Complete . Over the next 90 days, patient will follow recommended safety measures to prevent falls and injuries.-Complete . Over the next 90 days, patient will attend all scheduled medical appointments.   Interventions:  . Inter-disciplinary care team collaboration (see longitudinal plan of care) . Discussed compliance with COPD self-management.plan. Reports using her Spiriva inhaler since her PCP visit in July. Reports good symptom management particularly at night. She continues to experience episodes of shortness of breath with exertion but otherwise doing well. Denies changes or  decline in activity tolerance. She continues to monitor her oxygen saturations regularly. Reports readings remain in the high 90's. She is scheduled for outreach with the [facility] Pulmonology team in October.   . Reviewed recent BP readings. She continues to take her medications as prescribed. Reports good home BP readings. Denies readings outside of the established range.    . Discussed plan r/t degenerative disc disease.  She has completed her  physical therapy sessions. Reports using resistance bands and completing home exercises as recommended by the Ortho team. Reports avoiding overexertion and following recommended safety precautions.   . Reviewed plan for ongoing care management. Reports she is doing well in regards to her health. States her primary concern over the past few weeks has been her son's health. He is currently being treated for a chronic illness. Reports he does not require a caregiver but she is concerned about his overall wellbeing.  She agreed to update the care management team if she is required to assist and community resources are needed.    Patient Self Care Activities:  . Self administers medications as prescribed . Attends scheduled provider appointments . Calls pharmacy for medication refills . Performs ADL's independently . Performs IADL's independently   Please see past updates related to this goal by clicking on the "Past Updates" button in the selected goal           PLAN A member of the care management team will follow-up with Kristie Walters next month.   Kristie Walters [facility] Care Management [facility] [phone]

## 2019-11-15 NOTE — Patient Instructions (Signed)
Thank you for allowing the Chronic Care Management team to participate in your care.  Goals Addressed            This Visit's Progress   . Chronic Disease Management       CARE PLAN ENTRY (see longitudinal plan of care for additional care plan information)  Current Barriers:  . Chronic Disease Management support and education needs related to CAD, COPD and GERD.  Case Manager Clinical Goal(s):  [name] Over the next 120 days, patient will not require hospitalization or emergent care d/t chronic disease related complications.-Complete . Over the next 90 days, patient will monitor blood pressure and oxygen saturations routinely and record readings.-Complete . Over the next 90 days, patient will take all medications as prescribed.-Complete . Over the next 90 days, patient will follow recommended safety measures to prevent falls and injuries.-Complete . Over the next 90 days, patient will attend all scheduled medical appointments.   Interventions:  . Inter-disciplinary care team collaboration (see longitudinal plan of care) . Discussed compliance with COPD self-management.plan. Reports using her Spiriva inhaler since her PCP visit in July. Reports good symptom management particularly at night. She continues to experience episodes of shortness of breath with exertion but otherwise doing well. Denies changes or decline in activity tolerance. She continues to monitor her oxygen saturations regularly. Reports readings remain in the high 90's. She is scheduled for outreach with the [facility] Pulmonology team in October.   . Reviewed recent BP readings. She continues to take her medications as prescribed. Reports good home BP readings. Denies readings outside of the established range.    . Discussed plan r/t degenerative disc disease.  She has completed her physical therapy sessions. Reports using resistance bands and completing home exercises as recommended by the Ortho team. Reports avoiding  overexertion and following recommended safety precautions.   . Reviewed plan for ongoing care management. Reports she is doing well in regards to her health. States her primary concern over the past few weeks has been her son's health. He is currently being treated for a chronic illness. Reports he does not require a caregiver but she is concerned about his overall wellbeing.  She agreed to update the care management team if she is required to assist and community resources are needed.    Patient Self Care Activities:  . Self administers medications as prescribed . Attends scheduled provider appointments . Calls pharmacy for medication refills . Performs ADL's independently . Performs IADL's independently   Please see past updates related to this goal by clicking on the "Past Updates" button in the selected goal         Ms. Sackrider verbalized understanding of the information discussed during the telephonic outreach today. Declined need for a mailed/printed copy of the instructions.   A member of the care management team will follow-up with Ms. Wan next month.    Cristy Friedlander [facility] Care Management [facility] [phone]

## 2019-12-02 ENCOUNTER — Other Ambulatory Visit: Payer: Self-pay

## 2019-12-02 ENCOUNTER — Encounter: Payer: Self-pay | Admitting: Pulmonary Disease

## 2019-12-02 ENCOUNTER — Ambulatory Visit (INDEPENDENT_AMBULATORY_CARE_PROVIDER_SITE_OTHER): Payer: Medicare Other | Admitting: Pulmonary Disease

## 2019-12-02 VITALS — BP 110/68 | HR 58 | Temp 98.0°F | Ht 71.0 in | Wt 167.4 lb

## 2019-12-02 DIAGNOSIS — R0602 Shortness of breath: Secondary | ICD-10-CM | POA: Diagnosis not present

## 2019-12-02 DIAGNOSIS — Z87891 Personal history of nicotine dependence: Secondary | ICD-10-CM

## 2019-12-02 DIAGNOSIS — J449 Chronic obstructive pulmonary disease, unspecified: Secondary | ICD-10-CM | POA: Diagnosis not present

## 2019-12-02 MED ORDER — TRELEGY ELLIPTA 100-62.5-25 MCG/INH IN AEPB: 1.0000 | INHALATION_SPRAY | Freq: Every day | RESPIRATORY_TRACT | 0 refills | Status: AC

## 2019-12-02 NOTE — Progress Notes (Signed)
Patient seen in the office today and instructed on use of Trelegy.  Patient expressed understanding and demonstrated technique.  

## 2019-12-02 NOTE — Patient Instructions (Signed)
We have switch your inhaler to Trelegy Ellipta 1 inhalation daily let us know how this inhaler works for you and we can send in the prescription to your pharmacy.  Do not take Spiriva.  You are ordering breathing tests.  We will see you in follow-up in 3 months time call sooner should any new problems arise.

## 2019-12-02 NOTE — Progress Notes (Signed)
Subjective:    Patient ID: Kristie Walters, female    DOB: 05-Sep-1945, 74 y.o.   MRN: [id number] Requesting MD/Service: Steele Sizer, MD Date of initial consultation: 02 December 2019 Reason for consultation: Emphysema/shortness of breath   PT PROFILE: 74 year old former smoker (quit 2018) with a 55-pack-year history of smoking presents for evaluation of shortness of breath and emphysema.   DATA: 03/26/2019 2D echo: G2 DD, mild elevation of pulmonary artery systolic pressure, LVEF 55 to 60% 09/16/2019 low-dose lung cancer screening CT: Multiple small inflammatory groundglass nodules, mild diffuse bronchial wall thickening with mild centrilobular and paraseptal emphysema  INTERVAL: No interval history, initial visit here.  HPI The patient is a very pleasant 74 year old former smoker (quit in 2018, 55-pack-year history) who presents for evaluation of shortness of breath and COPD.  Patient is kindly referred by Dr. Steele Sizer.  The patient notes that she has become more short of breath particularly while walking to the mailbox and back.  She notes that when she lays down she "wheezes".  She has had 1-2 pillow orthopnea for a number of years.  She notes that her dyspnea on exertion has been present for years but has gotten worse "with time" but cannot tell exactly how long this has worsened.  She has never been formally evaluated by pulmonary physician.  Not had any fevers, chills or sweats.  She does endorse issues with occasional dysphagia, heartburn and indigestion.  She also notes occasional palpitations.  She did have an episode of paroxysmal atrial fibrillation which worsened her shortness of breath.  This has now been corrected and is being followed by cardiology.  She is on Eliquis.  She is currently maintained on Spiriva and as needed albuterol.  She notes that when she was first prescribed Spiriva years ago she felt that this benefited her.  Here lately however she does not feel this  medication helps.  She is retired from the [employer] does not have any Engineering geologist pets or hot tubs in the home.  No static hobbies.  She has a dog as her only pet.  Review of Systems A 10 point review of systems was performed and it is as noted above otherwise negative.  Past Medical History:  Diagnosis Date   Allergy    Atrial fibrillation ([place])    Basal cell carcinoma of nose 08/12/2017   Carotid artery disease (HCC)    s/p L. CEA in July 2016   Centrilobular emphysema ([place]) 09/29/2017   Collagen vascular disease ([place])    Left carotid stenosis   COPD (chronic obstructive pulmonary disease) (HCC)    Coronary artery disease 09/29/2017   Noted on chest CT July 2019   Elevated blood pressure (not hypertension)    Fatty liver 09/29/2017   Chest CT July 2019   GERD (gastroesophageal reflux disease)    Hyperlipidemia    Personal history of tobacco use, presenting hazards to health 08/31/2015   PONV (postoperative nausea and vomiting)    Past Surgical History:  Procedure Laterality Date   BREAST BIOPSY     ENDARTERECTOMY Left 08/25/2014   Procedure: ENDARTERECTOMY CAROTID;  Surgeon: Algernon Huxley, MD;  Location: [facility] ORS;  Service: Vascular;  Laterality: Left;   MOHS SURGERY     MOHS SURGERY  09/23/2017   nose   PERIPHERAL VASCULAR CATHETERIZATION N/A 07/20/2014   Procedure: Carotid Angiography;  Surgeon: Algernon Huxley, MD;  Location: [place] [facility];  Service: Cardiovascular;  Laterality: N/A;   TONSILLECTOMY  TUBAL LIGATION     Family History  Problem Relation Age of Onset   Heart attack Mother    Hypercholesterolemia Mother    Hypertension Mother    Peripheral vascular disease Mother    Dementia Mother    Hypothyroidism Mother    CVA Father    Liver cancer Father    Diabetes Brother    Kidney cancer Sister    Diabetes Brother    Alzheimer's disease Brother    Other Brother        alzheimers   Lymphoma Son    HIV Son    Breast  cancer Neg Hx    Social History   Tobacco Use   Smoking status: Former Smoker    Packs/day: 1.00    Years: 55.00    Pack years: 55.00    Types: Cigarettes    Quit date: 11/26/2016    Years since quitting: 3.0   Smokeless tobacco: Current User    Types: Snuff   Tobacco comment: smoking cessation materials not required  Substance Use Topics   Alcohol use: No   Allergies  Allergen Reactions   Morphine Nausea Only and Nausea And Vomiting   Morphine And Related Nausea And Vomiting   Current Meds  Medication Sig   apixaban (ELIQUIS) 5 MG TABS tablet Take 1 tablet (5 mg total) by mouth 2 (two) times daily.   aspirin 81 MG tablet Take 81 mg by mouth daily.   atorvastatin (LIPITOR) 20 MG tablet Take 0.5 tablets (10 mg total) by mouth daily.   calcium carbonate (OS-CAL - DOSED IN MG OF ELEMENTAL CALCIUM) 1250 (500 Ca) MG tablet Take 1 tablet by mouth.   cholecalciferol (VITAMIN D) 1000 units tablet Take 1,000 Units by mouth daily.   ezetimibe (ZETIA) 10 MG tablet Take 1 tablet (10 mg total) by mouth daily.   Fish Oil-Cholecalciferol (FISH OIL + D3 PO) Take 1,000 mg by mouth 1 day or 1 dose.   metoprolol tartrate (LOPRESSOR) 25 MG tablet Take 1 tablet (25 mg total) by mouth 2 (two) times daily.   omeprazole (PRILOSEC) 40 MG capsule Take 40 mg by mouth as needed.    pregabalin (LYRICA) 50 MG capsule Take 1 capsule (50 mg total) by mouth 2 (two) times daily.   tiotropium (SPIRIVA HANDIHALER) 18 MCG inhalation capsule Place 1 capsule (18 mcg total) into inhaler and inhale daily.   Immunization History  Administered Date(s) Administered   Influenza, High Dose Seasonal PF 10/08/2016, 10/01/2018, 10/08/2019   Influenza-Unspecified 11/19/2014, 11/16/2015, 10/08/2016, 09/18/2017   PFIZER SARS-COV-2 Vaccination 04/21/2019, 05/12/2019   Pneumococcal Conjugate-13 11/18/2016   Pneumococcal Polysaccharide-23 06/11/2012   Td 02/18/2009        Objective:   Physical  Exam Blood pressure 110/68, pulse (!) 58, temperature 98 F (36.7 C), temperature source Temporal, height 5\' 11"  (1.803 m), weight 167 lb 6.4 oz (75.9 kg), SpO2 96 %. GENERAL: Well-developed well-nourished woman in no acute distress.  She ambulates with assistance of a cane. HEAD: Normocephalic, atraumatic.  EYES: Pupils equal, round, reactive to light.  No scleral icterus.  MOUTH: Nose/mouth/throat not examined due to masking requirements for COVID 19. NECK: Supple. No thyromegaly. Trachea midline. No JVD.  No adenopathy. PULMONARY: Good air entry bilaterally.  Coarse breath sounds otherwise no adventitious sounds. CARDIOVASCULAR: S1 and S2. Regular rate and rhythm.  No rubs, murmurs or gallops heard. ABDOMEN: Benign. MUSCULOSKELETAL: No joint deformity, no clubbing, no edema.  NEUROLOGIC: No focal deficit noted, speech is fluent.  Gait steady with assistance (cane). SKIN: Intact,warm,dry. PSYCH: Jovial mood, normal behavior.  Ambulatory oximetry was performed today: Patient did not have any desaturations past 94%.  Representative slice from low-dose chest CT performed 09/16/2019, independently reviewed:      Assessment & Plan:     ICD-10-CM   1. COPD with chronic bronchitis and emphysema ([address])  J44.9    We will switch Spiriva to Trelegy Ellipta PFTs will be obtained  2. Shortness of breath  R06.02 Pulmonary Function Test [facility] Only   Recent exacerbation with episode of A. fib No evidence of oxygen desaturation with ambulation PFTs to assess  3. Former heavy tobacco smoker  Z87.891    She enrolled in low-dose cancer screening program   Orders Placed This Encounter  Procedures   Pulmonary Function Test [facility] Only    Standing Status:   Future    Standing Expiration Date:   12/01/2020    Scheduling Instructions:     Next available.    Order Specific Question:   Full PFT: includes the following: basic spirometry, spirometry pre & post bronchodilator, diffusion capacity (DLCO),  lung volumes    Answer:   Full PFT   Meds ordered this encounter  Medications   Fluticasone-Umeclidin-Vilant (TRELEGY ELLIPTA) 100-62.5-25 MCG/INH AEPB    Sig: Inhale 1 puff into the lungs daily for 1 day.    Dispense:  14 each    Refill:  0    Order Specific Question:   Lot Number?    Answer:   [id number]    Order Specific Question:   Expiration Date?    Answer:   05/19/2021    Order Specific Question:   Manufacturer?    Answer:   [employer]Smith[employer] [[id number]]    Order Specific Question:   Quantity    Answer:   1   Fluticasone-Umeclidin-Vilant (TRELEGY ELLIPTA) 100-62.5-25 MCG/INH AEPB    Sig: Inhale 1 puff into the lungs daily for 1 day.    Dispense:  14 each    Refill:  0    Order Specific Question:   Lot Number?    Answer:   [id number]    Order Specific Question:   Expiration Date?    Answer:   05/19/2021    Order Specific Question:   Manufacturer?    Answer:   Gl[employer] [[id number]]    Order Specific Question:   Quantity    Answer:   1   Discussion:  The patient appears to have tightness and emphysema.  She is currently not well compensated on her present regimen.  We will switch Spiriva to Trelegy Ellipta 100/62.5/25, 1 inhalation daily.  She will be scheduled for PFTs.  We will see her in follow-up in 3 months time she is to contact us sooner should any new difficulties arise.  Renold Don, MD [facility]   *This note was dictated using voice recognition software/Dragon.  Despite best efforts to proofread, errors can occur which can change the meaning.  Any change was purely unintentional.

## 2019-12-05 ENCOUNTER — Other Ambulatory Visit: Payer: Self-pay | Admitting: Physician Assistant

## 2019-12-05 ENCOUNTER — Other Ambulatory Visit: Payer: Self-pay | Admitting: Family Medicine

## 2019-12-05 DIAGNOSIS — F341 Dysthymic disorder: Secondary | ICD-10-CM

## 2019-12-06 ENCOUNTER — Telehealth: Payer: Self-pay

## 2019-12-10 ENCOUNTER — Encounter: Payer: Self-pay | Admitting: Internal Medicine

## 2019-12-10 ENCOUNTER — Ambulatory Visit (INDEPENDENT_AMBULATORY_CARE_PROVIDER_SITE_OTHER): Payer: Medicare Other | Admitting: Internal Medicine

## 2019-12-10 ENCOUNTER — Ambulatory Visit: Payer: Self-pay

## 2019-12-10 DIAGNOSIS — J449 Chronic obstructive pulmonary disease, unspecified: Secondary | ICD-10-CM

## 2019-12-10 DIAGNOSIS — J0100 Acute maxillary sinusitis, unspecified: Secondary | ICD-10-CM | POA: Diagnosis not present

## 2019-12-10 DIAGNOSIS — H6692 Otitis media, unspecified, left ear: Secondary | ICD-10-CM | POA: Diagnosis not present

## 2019-12-10 MED ORDER — FLUTICASONE PROPIONATE 50 MCG/ACT NA SUSP: 2.0000 | Freq: Every day | NASAL | 6 refills | Status: DC

## 2019-12-10 MED ORDER — AMOXICILLIN-POT CLAVULANATE 875-125 MG PO TABS: 1.0000 | ORAL_TABLET | Freq: Two times a day (BID) | ORAL | 0 refills | Status: DC

## 2019-12-10 NOTE — Progress Notes (Signed)
Name: Kristie Walters   MRN: [id number]    DOB: [birth year]   Date:12/10/2019       Progress Note  Subjective  Chief Complaint  Chief Complaint  Patient presents with  . Otitis Media  . Headache    Has some pressure in her head.    I connected with  Deeann Saint on 12/10/19 at  2:00 PM EDT by telephone and verified that I am speaking with the correct person using two identifiers.  I discussed the limitations, risks, security and privacy concerns of performing an evaluation and management service by telephone and the availability of in person appointments. The patient expressed understanding and agreed to proceed. Staff also discussed with the patient that there may be a patient responsible charge related to this service. Patient Location: Home Provider Location: [facility] Additional Individuals present: none  HPI  Patient is a 74 year old female patient of Dr. Ancil Boozer Last seen by her in August 2021.  Has a follow-up planned again in December. She recently followed up with pulmonary on 12/02/2019 with her Spiriva inhaler changed to Trelegy for management of her COPD with chronic bronchitis/emphysema She follows up today via phone visit with ear pain and a head pressure feeling. I reviewed with the patient the marked limitations with this being a phone visit  This time of year, usually have some sinus problems, ears were itching and then developed a pain, like a toothache throb in her left ear and hurts from nose over to ear. Left sinus area hurts when presses,  No fevers, blowing her nose and minimal  mucus, noted blood yesterday, not since Right ear feels like stopped up at times, with left the ear that is much more painful. States more than typical allergies Notes Trelegy change is when her sx's started, stopped it yesterday and I rec'ed return to taking the Trelegy Not sleep last night due to the ear pain. Has used flonase before and not have any presently Tried Benadryl and  tylenol She thinks an abx is "a wise choice" when we discussed options   Patient Active Problem List   Diagnosis Date Noted  . 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 Fatty liver 09/29/2017  . Coronary artery disease 09/29/2017  . Centrilobular emphysema ([place]) 09/29/2017  . Basal cell carcinoma of nose 08/12/2017  . Estrogen deficiency 08/01/2017  . Aortic atherosclerosis ([place]) 09/14/2015  . Personal history of tobacco use, presenting hazards to health 08/31/2015  . Paresthesia of foot, bilateral 08/15/2015  . Breast mass, right 07/12/2015  . Elevated alkaline phosphatase level 05/29/2015  . COPD (chronic obstructive pulmonary disease) ([place]) 04/26/2015  . GERD (gastroesophageal reflux disease) 04/26/2015  . Hyperlipidemia 04/26/2015  . Hyperglycemia 04/26/2015  . Carotid stenosis 08/25/2014  . H/O malignant neoplasm of skin 08/13/2012    Past Surgical History:  Procedure Laterality Date  . BREAST BIOPSY    . ENDARTERECTOMY Left 08/25/2014   Procedure: ENDARTERECTOMY CAROTID;  Surgeon: Algernon Huxley, MD;  Location: [facility] ORS;  Service: Vascular;  Laterality: Left;  . MOHS SURGERY    . MOHS SURGERY  09/23/2017   nose  . PERIPHERAL VASCULAR CATHETERIZATION N/A 07/20/2014   Procedure: Carotid Angiography;  Surgeon: Algernon Huxley, MD;  Location: [place] [facility];  Service: Cardiovascular;  Laterality: N/A;  . TONSILLECTOMY    . TUBAL LIGATION      Family History  Problem Relation Age of Onset  . Heart attack Mother   . Hypercholesterolemia Mother   .  Hypertension Mother   . Peripheral vascular disease Mother   . Dementia Mother   . Hypothyroidism Mother   . CVA Father   . Liver cancer Father   . Diabetes Brother   . Kidney cancer Sister   . Diabetes Brother   . Alzheimer's disease Brother   . Other Brother        alzheimers  . Lymphoma Son   . HIV Son   . Breast cancer Neg Hx     Social History   Tobacco Use  . Smoking status: Former Smoker    Packs/day: 1.00      Years: 55.00    Pack years: 55.00    Types: Cigarettes    Quit date: 11/26/2016    Years since quitting: 3.0  . Smokeless tobacco: Current User    Types: Snuff  . Tobacco comment: smoking cessation materials not required  Substance Use Topics  . Alcohol use: No     Current Outpatient Medications:  .  apixaban (ELIQUIS) 5 MG TABS tablet, Take 1 tablet (5 mg total) by mouth 2 (two) times daily., Disp: 180 tablet, Rfl: 2 .  aspirin 81 MG tablet, Take 81 mg by mouth daily., Disp: , Rfl:  .  atorvastatin (LIPITOR) 20 MG tablet, Take 0.5 tablets (10 mg total) by mouth daily., Disp: 90 tablet, Rfl: 2 .  calcium carbonate (OS-CAL - DOSED IN MG OF ELEMENTAL CALCIUM) 1250 (500 Ca) MG tablet, Take 1 tablet by mouth., Disp: , Rfl:  .  cholecalciferol (VITAMIN D) 1000 units tablet, Take 1,000 Units by mouth daily., Disp: , Rfl:  .  ezetimibe (ZETIA) 10 MG tablet, Take 1 tablet (10 mg total) by mouth daily., Disp: 90 tablet, Rfl: 2 .  Fish Oil-Cholecalciferol (FISH OIL + D3 PO), Take 1,000 mg by mouth 1 day or 1 dose., Disp: , Rfl:  .  metoprolol tartrate (LOPRESSOR) 25 MG tablet, TAKE 1 TABLET BY MOUTH TWICE A DAY, Disp: 180 tablet, Rfl: 0 .  omeprazole (PRILOSEC) 40 MG capsule, Take 40 mg by mouth as needed. , Disp: , Rfl:  .  pregabalin (LYRICA) 50 MG capsule, Take 1 capsule (50 mg total) by mouth 2 (two) times daily., Disp: 180 capsule, Rfl: 1  Allergies  Allergen Reactions  . Morphine Nausea Only and Nausea And Vomiting  . Morphine And Related Nausea And Vomiting    With staff assistance, above reviewed with the patient today.  ROS: As per HPI, otherwise no specific complaints on a limited and focused system review   Objective  Virtual encounter, vitals not obtained.  There is no height or weight on file to calculate BMI.  Physical Exam   Appears in NAD via conversation Breathing: No obvious respiratory distress. Speaking in complete sentences Neurological: Pt is alert, Speech  is normal Psychiatric: Patient has a normal mood and affect, behavior is normal. Judgment and thought content normal.   No results found for this or any previous visit (from the past 72 hour(s)).  PHQ2/9: Depression screen [facility] 2/9 11/03/2019 09/30/2019 09/15/2019 09/15/2019 08/05/2019  Decreased Interest 0 0 2 0 0  Down, Depressed, Hopeless 1 1 1  0 0  PHQ - 2 Score 1 1 3  0 0  Altered sleeping - 2 3 0 -  Tired, decreased energy - 0 3 0 -  Change in appetite - 0 0 0 -  Feeling bad or failure about yourself  - 0 0 0 -  Trouble concentrating - 0 0 0 -  Moving slowly or fidgety/restless - 0 0 0 -  Suicidal thoughts - 0 0 0 -  PHQ-9 Score - 3 9 0 -  Difficult doing work/chores - - Not difficult at all - -  Some recent data might be hidden   PHQ-2/9 Result reviewed  Fall Risk: Fall Risk  09/30/2019 09/15/2019 08/05/2019 07/21/2019 07/12/2019  Falls in the past year? 0 0 0 0 0  Comment - - - - -  Number falls in past yr: 0 0 0 - 0  Injury with Fall? 0 0 0 - 0  Risk for fall due to : - - Impaired balance/gait - -  Risk for fall due to: Comment - - - - -  Follow up - - Falls prevention discussed Falls prevention discussed Falls evaluation completed     Assessment & Plan 1. Acute maxillary sinusitis, recurrence not specified 2. Otitis of left ear Discussed concerns that her ear pain is related to her sinus infection concerns with her having pain from her nose over towards her ear, and tender when she presses on the maxillary sinus area on the left. Noted the difficulties in diagnosing not being able to have an onsite visit, and actually looking into her ears. She felt best to add the antibiotic when discussed the options, and felt reasonable to do so. We will prescribe Augmentin-twice daily Also a Flonase nasal spray-can use once daily to help as well lessen inflammation, she notes she has had that in the past and tolerated that. Can use Tylenol products as needed for discomfort. Rest and  staying well hydrated helpful Emphasized the importance of not improving or more problematic to follow-up as well.  3. Chronic obstructive pulmonary disease, unspecified COPD type ([place]) Did note she should continue the Trelegy product and not stop, as I do not think her symptoms are due to starting this product, and she notes it is helpful with her breathing.  Await her response, and if not improving or more problematic she needs to follow-up and she was understanding of that.  I discussed the assessment and treatment plan with the patient. The patient was provided an opportunity to ask questions and all were answered. The patient agreed with the plan and demonstrated an understanding of the instructions.   The patient was advised to call back or seek an in-person evaluation if the symptoms worsen or if the condition fails to improve as anticipated.  I provided 15 minutes of non-face-to-face time during this encounter that included discussing at length patient's sx/history, pertinent pmhx, medications, treatment and follow up plan. This time also included the necessary documentation, orders, and chart review.  Towanda Malkin, MD

## 2019-12-10 NOTE — Chronic Care Management (AMB) (Signed)
Chronic Care Management   Follow Up Note   12/10/2019 Name: Kristie Walters MRN: [id number] DOB: [birth year]  Primary Care Provider: Steele Sizer, MD Reason for referral : Chronic Care Management   Kristie Walters is a 74 y.o. year old female who is a primary care patient of Steele Sizer, MD. She is currently engaged with the chronic care management team. A routine outreach was conducted today.  Review of Kristie Walters status, including review of consultants reports, relevant labs and test results was conducted today. Collaboration with appropriate care team members was performed as part of the comprehensive evaluation and provision of chronic care management services.    SDOH (Social Determinants of Health) assessments performed: No   Outpatient Encounter Medications as of 12/10/2019  Medication Sig  . amoxicillin-clavulanate (AUGMENTIN) 875-125 MG tablet Take 1 tablet by mouth 2 (two) times daily.  [name] apixaban (ELIQUIS) 5 MG TABS tablet Take 1 tablet (5 mg total) by mouth 2 (two) times daily.  [name] aspirin 81 MG tablet Take 81 mg by mouth daily.  [name] atorvastatin (LIPITOR) 20 MG tablet Take 0.5 tablets (10 mg total) by mouth daily.  . calcium carbonate (OS-CAL - DOSED IN MG OF ELEMENTAL CALCIUM) 1250 (500 Ca) MG tablet Take 1 tablet by mouth.  . cholecalciferol (VITAMIN D) 1000 units tablet Take 1,000 Units by mouth daily.  [name] ezetimibe (ZETIA) 10 MG tablet Take 1 tablet (10 mg total) by mouth daily.  [name] Fish Oil-Cholecalciferol (FISH OIL + D3 PO) Take 1,000 mg by mouth 1 day or 1 dose.  . fluticasone (FLONASE) 50 MCG/ACT nasal spray Place 2 sprays into both nostrils daily.  . metoprolol tartrate (LOPRESSOR) 25 MG tablet TAKE 1 TABLET BY MOUTH TWICE A DAY  . omeprazole (PRILOSEC) 40 MG capsule Take 40 mg by mouth as needed.   . pregabalin (LYRICA) 50 MG capsule Take 1 capsule (50 mg total) by mouth 2 (two) times daily.   No facility-administered encounter medications on file as of  12/10/2019.       Goals Addressed            This Visit's Progress   . Chronic Disease Management       CARE PLAN ENTRY (see longitudinal plan of care for additional care plan information)  Current Barriers:  . Chronic Disease Management support and education needs related to CAD, COPD and GERD.  Case Manager Clinical Goal(s):  [name] Over the next 120 days, patient will not require hospitalization or emergent care d/t chronic disease related complications.-Complete . Over the next 90 days, patient will monitor blood pressure and oxygen saturations routinely and record readings.-Complete . Over the next 90 days, patient will take all medications as prescribed.-Complete . Over the next 90 days, patient will follow recommended safety measures to prevent falls and injuries.-Complete . Over the next 90 days, patient will attend all scheduled medical appointments.   Interventions:  . Inter-disciplinary care team collaboration (see longitudinal plan of care) . Discussed compliance with COPD self-management.plan. Attended outreach with the [facility] team as scheduled. Her inhaler was changed from Spiriva to Trelegy. Reports good symptom control with oxygen saturations in the high 90's. No changes in activity tolerance. She anticipates completing a follow-up for Pulmonary Function Tests within the next two months.  . Reviewed plan for ongoing care management. Kristie Walters remains compliant with medications and provider treatment recommendations. She plans to schedule a routine visit with her Cardiologist within the next few months. Denies urgent concerns or  changes in care management needs. The care management team will follow up in December. She agreed to call if needed prior to the next outreach.   Patient Self Care Activities:  . Self administers medications as prescribed . Attends scheduled provider appointments . Calls pharmacy for medication refills . Performs ADL's  independently . Performs IADL's independently   Please see past updates related to this goal by clicking on the "Past Updates" button in the selected goal          PLAN A member of the care management team will follow up with Kristie Walters in December.    C[facility] Care Management [facility] [phone]

## 2019-12-23 ENCOUNTER — Other Ambulatory Visit: Payer: Self-pay | Admitting: Physician Assistant

## 2019-12-23 NOTE — Telephone Encounter (Signed)
Pt last saw Dr Rockey Situ 04/13/19, last labs 07/12/19 Creat 0.94, age 75, weight 75.9kg, based on specified criteria pt is on appropriate dosage of Eliquis 5mg  BID.  Will refill rx.

## 2019-12-27 ENCOUNTER — Telehealth: Payer: Self-pay | Admitting: Pulmonary Disease

## 2019-12-27 ENCOUNTER — Other Ambulatory Visit: Payer: Self-pay

## 2019-12-27 DIAGNOSIS — H6692 Otitis media, unspecified, left ear: Secondary | ICD-10-CM

## 2019-12-27 DIAGNOSIS — J0100 Acute maxillary sinusitis, unspecified: Secondary | ICD-10-CM

## 2019-12-27 MED ORDER — TRELEGY ELLIPTA 100-62.5-25 MCG/INH IN AEPB: 1.0000 | INHALATION_SPRAY | Freq: Every day | RESPIRATORY_TRACT | 11 refills | Status: AC

## 2019-12-27 NOTE — Patient Instructions (Signed)
Thank you for allowing the Chronic Care Management team to participate in your care.  Goals Addressed            This Visit's Progress   . Chronic Disease Management       CARE PLAN ENTRY (see longitudinal plan of care for additional care plan information)  Current Barriers:  . Chronic Disease Management support and education needs related to CAD, COPD and GERD.  Case Manager Clinical Goal(s):  [name] Over the next 120 days, patient will not require hospitalization or emergent care d/t chronic disease related complications.-Complete . Over the next 90 days, patient will monitor blood pressure and oxygen saturations routinely and record readings.-Complete . Over the next 90 days, patient will take all medications as prescribed.-Complete . Over the next 90 days, patient will follow recommended safety measures to prevent falls and injuries.-Complete . Over the next 90 days, patient will attend all scheduled medical appointments.   Interventions:  . Inter-disciplinary care team collaboration (see longitudinal plan of care) . Discussed compliance with COPD self-management.plan. Attended outreach with the [facility] Pulmonology team as scheduled. Her inhaler was changed from Spiriva to Trelegy. Reports good symptom control with oxygen saturations in the high 90's. No changes in activity tolerance. She anticipates completing a follow-up for Pulmonary Function Tests within the next two months.  . Reviewed plan for ongoing care management. Ms. Colwell remains compliant with medications and provider treatment recommendations. She plans to schedule a routine visit with her Cardiologist within the next few months. Denies urgent concerns or changes in care management needs. The care management team will follow up in December. She agreed to call if needed prior to the next outreach.   Patient Self Care Activities:  . Self administers medications as prescribed . Attends scheduled provider  appointments . Calls pharmacy for medication refills . Performs ADL's independently . Performs IADL's independently   Please see past updates related to this goal by clicking on the "Past Updates" button in the selected goal        Ms. Eden verbalized understanding of the information discussed during the telephonic outreach today. Declined need for a mailed/printed copy of the instructions.    A member of the care management team will follow up with Ms. Dallas Breeding in December.    [facility] [phone]

## 2019-12-27 NOTE — Telephone Encounter (Signed)
Spoke to patient, who is requesting refill on Trelegy 100, as she feels that this medication is effective.  Rx has been sent to preferred pharmacy.  Nothing further needed.

## 2019-12-28 NOTE — Progress Notes (Signed)
Name: Kristie Walters   MRN: [id number]    DOB: 08-19-1945   Date:12/31/2019       Progress Note  Subjective  Chief Complaint  Follow up   HPI   Paroxysmal Afib: went to [facility] on 03/06/2019 after onset of chest pain, palpitation and SOB, transported by EMS. She was given Metoprolol and cardioverted. Seen by Dr. Rockey Situ on 04/13/2019 and EKG during the visit showed normal sinus rhythm. She states over the past few weeks has noticed increase in chest pressure and palpitation, SOB is stable, she states she knows is the Afib flaring up, she states not severe enough to call EMS, advised to follow up sooner with cardiologist   Echo 03/2019 1. Left ventricular ejection fraction, by visual estimation, is 55 to  60%. The left ventricle has normal function. There is no left ventricular  hypertrophy.  2. Left ventricular diastolic parameters are consistent with Grade II  diastolic dysfunction (pseudonormalization).  Mildly elevated pulmonary artery systolic pressure.   CAD/Carotid Artery Stenosis: coronary artery calcium incidentally found on CT but stress test no significant ischemia in 2017,  She has noticed intermittent chest pressure, not associated with nausea or diaphoresis, but she has intermittent nausea  She also has carotid atherosclerosis and sees Dr. Lucky Cowboy - history of left sided CEA. On statin and zetia. Reviewed last labs , last LDL was down to 73, HDL slightly better from 36 to 38   Abnormal mammogram on 02/2019, went back for breast biopsy of left and results negative, repeat in one year. Unchanged   Elevated TSH: last level back to normal. Unchanged   Thoracic spine compression fracture found on CT done 07//2020, she has chronic pain on thoracic spine, it radiates to both side, she is on Lyrica daily, but she has noticed increase in pain on right posterior upper back over this past week.   She has not contacted Endo yet, she is behind on Reclast dose by almost one year    COPD/Chronic bronchitis/Emphysema:  she no longer smokes, she quit in 2018, she was a heavy smoker one pack daily from age 32 until age 74. She was on Spiriva, but is now seeing Dr. Duwayne Heck and is doing well on Trelegy, she states breathing is easier now, she has a morning cough but not as severe, she has SOB but is stable. She states has noticed some hoarseness intermittently with Trelegy but not daily .   Cervical Radiculitis: she states still has paresthesia that goes in the ulnar aspect of right arm, she has pain with rom of her neck, on lyrica helps. She states not a constant problem, about a couple of times a week. She had PT but she stopped because she did not want to go weekly, she is doing home exercises and when she is consistent with exercises her symptoms improve   Peripheral neuropathy: she states she has constant tingling and pain on her feet but not severe until the night , Lyrica has helped with pain . She states she has a history of injury on both feet, she has a hot sensation that is intense on top of left foot. She states pain was 4/10, she has gait problems due to paresthesia, she has been using a cane most of the time, forgot to bring it today . She denies any falls   Dysthymia : son lives in [place]. He has HIV and also T-cell lymphoma, she worries about him,last time she came in we gave her duloxetine but she  stopped because it made her feel groggy, she does not want medications, she states coping okay   Senile purpura: on Eliquis gave her reassurance  GERD: she states needs refill of Omeprazole low dose does not work well for her, discussed long term risk of medication. She will try every other day to see if symptoms controlled, and follow GERD appropriate diet   Patient Active Problem List   Diagnosis Date Noted  . 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 Fatty liver 09/29/2017  . Coronary artery disease 09/29/2017  . Centrilobular emphysema ([place]) 09/29/2017  .  Basal cell carcinoma of nose 08/12/2017  . Estrogen deficiency 08/01/2017  . Aortic atherosclerosis ([place]) 09/14/2015  . Personal history of tobacco use, presenting hazards to health 08/31/2015  . Paresthesia of foot, bilateral 08/15/2015  . Breast mass, right 07/12/2015  . Elevated alkaline phosphatase level 05/29/2015  . COPD (chronic obstructive pulmonary disease) ([place]) 04/26/2015  . GERD (gastroesophageal reflux disease) 04/26/2015  . Hyperlipidemia 04/26/2015  . Hyperglycemia 04/26/2015  . Carotid stenosis 08/25/2014  . H/O malignant neoplasm of skin 08/13/2012    Past Surgical History:  Procedure Laterality Date  . BREAST BIOPSY    . ENDARTERECTOMY Left 08/25/2014   Procedure: ENDARTERECTOMY CAROTID;  Surgeon: Algernon Huxley, MD;  Location: [facility] ORS;  Service: Vascular;  Laterality: Left;  . MOHS SURGERY    . MOHS SURGERY  09/23/2017   nose  . PERIPHERAL VASCULAR CATHETERIZATION N/A 07/20/2014   Procedure: Carotid Angiography;  Surgeon: Algernon Huxley, MD;  Location: [place] [facility];  Service: Cardiovascular;  Laterality: N/A;  . TONSILLECTOMY    . TUBAL LIGATION      Family History  Problem Relation Age of Onset  . Heart attack Mother   . Hypercholesterolemia Mother   . Hypertension Mother   . Peripheral vascular disease Mother   . Dementia Mother   . Hypothyroidism Mother   . CVA Father   . Liver cancer Father   . Diabetes Brother   . Kidney cancer Sister   . Diabetes Brother   . Alzheimer's disease Brother   . Other Brother        alzheimers  . Lymphoma Son   . HIV Son   . Breast cancer Neg Hx     Social History   Tobacco Use  . Smoking status: Former Smoker    Packs/day: 1.00    Years: 55.00    Pack years: 55.00    Types: Cigarettes    Quit date: 11/26/2016    Years since quitting: 3.0  . Smokeless tobacco: Current User    Types: Snuff  . Tobacco comment: smoking cessation materials not required  Substance Use Topics  . Alcohol use: No      Current Outpatient Medications:  .  aspirin 81 MG tablet, Take 81 mg by mouth daily., Disp: , Rfl:  .  atorvastatin (LIPITOR) 20 MG tablet, Take 0.5 tablets (10 mg total) by mouth daily., Disp: 90 tablet, Rfl: 2 .  calcium carbonate (OS-CAL - DOSED IN MG OF ELEMENTAL CALCIUM) 1250 (500 Ca) MG tablet, Take 1 tablet by mouth., Disp: , Rfl:  .  cholecalciferol (VITAMIN D) 1000 units tablet, Take 1,000 Units by mouth daily., Disp: , Rfl:  .  ELIQUIS 5 MG TABS tablet, TAKE 1 TABLET BY MOUTH TWICE A DAY, Disp: 180 tablet, Rfl: 1 .  ezetimibe (ZETIA) 10 MG tablet, Take 1 tablet (10 mg total) by mouth daily. PLEASE CALL OFFICE TO SCHEDULE APPOINTMENT., Disp:  90 tablet, Rfl: 0 .  Fish Oil-Cholecalciferol (FISH OIL + D3 PO), Take 1,000 mg by mouth 1 day or 1 dose., Disp: , Rfl:  .  fluticasone (FLONASE) 50 MCG/ACT nasal spray, Place 2 sprays into both nostrils daily., Disp: 16 g, Rfl: 6 .  Fluticasone-Umeclidin-Vilant (TRELEGY ELLIPTA) 100-62.5-25 MCG/INH AEPB, Inhale 1 puff into the lungs daily., Disp: 60 each, Rfl: 11 .  metoprolol tartrate (LOPRESSOR) 25 MG tablet, TAKE 1 TABLET BY MOUTH TWICE A DAY, Disp: 180 tablet, Rfl: 0 .  omeprazole (PRILOSEC) 40 MG capsule, Take 40 mg by mouth as needed. , Disp: , Rfl:  .  pregabalin (LYRICA) 50 MG capsule, Take 1 capsule (50 mg total) by mouth 2 (two) times daily., Disp: 180 capsule, Rfl: 1 .  amoxicillin-clavulanate (AUGMENTIN) 875-125 MG tablet, Take 1 tablet by mouth 2 (two) times daily. (Patient not taking: Reported on 12/31/2019), Disp: 20 tablet, Rfl: 0  Allergies  Allergen Reactions  . Morphine Nausea Only and Nausea And Vomiting  . Morphine And Related Nausea And Vomiting    I personally reviewed active problem list, medication list, allergies, family history, social history, health maintenance with the patient/caregiver today.   ROS  Constitutional: Negative for fever or weight change.  Respiratory: Negative for cough and shortness of  breath.   Cardiovascular: Negative for chest pain or palpitations.  Gastrointestinal: Negative for abdominal pain, no bowel changes.  Musculoskeletal: Negative for gait problem or joint swelling.  Skin: Negative for rash.  Neurological: Negative for dizziness or headache.  No other specific complaints in a complete review of systems (except as listed in HPI above).  Objective  Vitals:   12/31/19 0904  BP: 116/72  Pulse: 71  Resp: 16  Temp: 98 F (36.7 C)  TempSrc: Oral  SpO2: 97%  Weight: 169 lb 3.2 oz (76.7 kg)  Height: 5\' 11"  (1.803 m)    Body mass index is 23.6 kg/m.  Physical Exam  Constitutional: Patient appears well-developed and well-nourished.  No distress.  HEENT: head atraumatic, normocephalic, pupils equal and reactive to light, neck supple Cardiovascular: Normal rate, regular rhythm and normal heart sounds.  No murmur heard. No BLE edema. Pulmonary/Chest: Effort normal and breath sounds normal. No respiratory distress. Abdominal: Soft.  There is no tenderness. Psychiatric: Patient has a normal mood and affect. behavior is normal. Judgment and thought content normal. Skin : senile purpura   PHQ2/9: Depression screen [facility] 2/9 12/31/2019 12/10/2019 11/03/2019 09/30/2019 09/15/2019  Decreased Interest 1 0 0 0 2  Down, Depressed, Hopeless 0 0 1 1 1   PHQ - 2 Score 1 0 1 1 3   Altered sleeping 1 - - 2 3  Tired, decreased energy 1 - - 0 3  Change in appetite 0 - - 0 0  Feeling bad or failure about yourself  0 - - 0 0  Trouble concentrating 0 - - 0 0  Moving slowly or fidgety/restless 0 - - 0 0  Suicidal thoughts 0 - - 0 0  PHQ-9 Score 3 - - 3 9  Difficult doing work/chores Not difficult at all - - - Not difficult at all  Some recent data might be hidden    phq 9 is positive   Fall Risk: Fall Risk  12/31/2019 12/10/2019 09/30/2019 09/15/2019 08/05/2019  Falls in the past year? 0 0 0 0 0  Comment - - - - -  Number falls in past yr: 0 0 0 0 0  Injury with Fall? 0  0 0  0 0  Risk for fall due to : - - - - Impaired balance/gait  Risk for fall due to: Comment - - - - -  Follow up - - - - Falls prevention discussed    Functional Status Survey: Is the patient deaf or have difficulty hearing?: Yes Does the patient have difficulty seeing, even when wearing glasses/contacts?: No Does the patient have difficulty concentrating, remembering, or making decisions?: No Does the patient have difficulty walking or climbing stairs?: Yes Does the patient have difficulty dressing or bathing?: No Does the patient have difficulty doing errands alone such as visiting a doctor's office or shopping?: No    Assessment & Plan  1. Paroxysmal atrial fibrillation (HCC)  Needs to follow up with Dr. Rockey Situ   2. COPD with chronic bronchitis and emphysema ([place])  Under the care of Dr. Duwayne Heck   3. Aortic atherosclerosis (HCC)  - atorvastatin (LIPITOR) 20 MG tablet; Take 1 tablet (20 mg total) by mouth daily.  Dispense: 90 tablet; Refill: 1  Advised to increase dose to 20  Mg daily   4. Age-related osteoporosis without current pathological fracture  She needs to follow up with Endo   5. Dysthymia  Stable  6. Coronary artery disease involving native coronary artery of native heart with angina pectoris (HCC)  - atorvastatin (LIPITOR) 20 MG tablet; Take 1 tablet (20 mg total) by mouth daily.  Dispense: 90 tablet; Refill: 1  7. Gait difficulty   8. Neuropathy  We will increase dose to TID - pregabalin (LYRICA) 50 MG capsule; Take 1 capsule (50 mg total) by mouth 3 (three) times daily.  Dispense: 270 capsule; Refill: 1  9. Pulmonary hypertension ([place])  Under the care of cardiologist and pulmonologist   10. Bilateral carotid artery stenosis   11. Mixed hyperlipidemia  - atorvastatin (LIPITOR) 20 MG tablet; Take 1 tablet (20 mg total) by mouth daily.  Dispense: 90 tablet; Refill: 1  12. Gastroesophageal reflux disease without esophagitis  - omeprazole  (PRILOSEC) 40 MG capsule; Take 1 capsule (40 mg total) by mouth as needed.  Dispense: 90 capsule; Refill: 1   13. Senile purpura ([place])

## 2019-12-31 ENCOUNTER — Encounter: Payer: Self-pay | Admitting: Family Medicine

## 2019-12-31 ENCOUNTER — Ambulatory Visit (INDEPENDENT_AMBULATORY_CARE_PROVIDER_SITE_OTHER): Payer: Medicare Other | Admitting: Family Medicine

## 2019-12-31 ENCOUNTER — Other Ambulatory Visit: Payer: Self-pay

## 2019-12-31 VITALS — BP 116/72 | HR 71 | Temp 98.0°F | Resp 16 | Ht 71.0 in | Wt 169.2 lb

## 2019-12-31 DIAGNOSIS — I700 Atherosclerosis of aorta: Secondary | ICD-10-CM

## 2019-12-31 DIAGNOSIS — I272 Pulmonary hypertension, unspecified: Secondary | ICD-10-CM

## 2019-12-31 DIAGNOSIS — J449 Chronic obstructive pulmonary disease, unspecified: Secondary | ICD-10-CM | POA: Diagnosis not present

## 2019-12-31 DIAGNOSIS — E782 Mixed hyperlipidemia: Secondary | ICD-10-CM

## 2019-12-31 DIAGNOSIS — F341 Dysthymic disorder: Secondary | ICD-10-CM

## 2019-12-31 DIAGNOSIS — I25119 Atherosclerotic heart disease of native coronary artery with unspecified angina pectoris: Secondary | ICD-10-CM

## 2019-12-31 DIAGNOSIS — D692 Other nonthrombocytopenic purpura: Secondary | ICD-10-CM

## 2019-12-31 DIAGNOSIS — R269 Unspecified abnormalities of gait and mobility: Secondary | ICD-10-CM

## 2019-12-31 DIAGNOSIS — M810 Age-related osteoporosis without current pathological fracture: Secondary | ICD-10-CM | POA: Diagnosis not present

## 2019-12-31 DIAGNOSIS — G629 Polyneuropathy, unspecified: Secondary | ICD-10-CM

## 2019-12-31 DIAGNOSIS — I6523 Occlusion and stenosis of bilateral carotid arteries: Secondary | ICD-10-CM

## 2019-12-31 DIAGNOSIS — I480 Paroxysmal atrial fibrillation: Secondary | ICD-10-CM | POA: Diagnosis not present

## 2019-12-31 DIAGNOSIS — K219 Gastro-esophageal reflux disease without esophagitis: Secondary | ICD-10-CM

## 2019-12-31 DIAGNOSIS — J4489 Other specified chronic obstructive pulmonary disease: Secondary | ICD-10-CM

## 2019-12-31 MED ORDER — ATORVASTATIN CALCIUM 20 MG PO TABS: 20.0000 mg | ORAL_TABLET | Freq: Every day | ORAL | 1 refills | Status: DC

## 2019-12-31 MED ORDER — OMEPRAZOLE 40 MG PO CPDR: 40.0000 mg | DELAYED_RELEASE_CAPSULE | ORAL | 1 refills | Status: DC | PRN

## 2019-12-31 MED ORDER — PREGABALIN 50 MG PO CAPS: 50.0000 mg | ORAL_CAPSULE | Freq: Three times a day (TID) | ORAL | 1 refills | Status: DC

## 2020-01-03 NOTE — Progress Notes (Signed)
Cardiology Office Note    Date:  01/05/2020   ID:  Kristie Walters, DOB 12/15/1945, MRN [id number]  PCP:  Steele Sizer, MD  Cardiologist:  Ida Rogue, MD  Electrophysiologist:  None   Chief Complaint: Palpitations/chest pressure  History of Present Illness:   Kristie Walters is a 74 y.o. female with history of coronary artery calcium noted on prior noninvasive imaging, PAF diagnosed in 02/2019 on Eliquis, COPD secondary to prior tobacco use with a 56-pack-year history quitting in 11/2017, carotid artery disease status post left-sided CEA in 08/2014 followed by vascular surgery with carotid artery ultrasound from 07/2018 showing bilateral 1 to 39% ICA stenosis, collagen vascular disease, fatty liver disease, and basal cell carcinoma of the nose status post Mohs procedure who presents for evaluation of palpitations and chest pressure.  She was previously followed by Dr. Clayborn Bigness though subsequently transitioned to Dr. Rockey Situ in 09/2017.  Prior echo from 09/2015 done by outside cardiology group showed an EF greater than 55%, mild LVH, normal RV systolic function, mild MR/TR.  Nuclear stress test at that time showed an EF of 77% with no evidence of significant ischemia or scar.  She was referred to [facility] in 09/2017 for evaluation of incidentally noted aortic atherosclerosis and coronary artery calcium along the LAD on noninvasive imaging.  She was noted to not be very active at baseline secondary to back pain.  She did note some rare palpitations associated with stress that improved with rest.  Aggressive primary prevention was recommended.  She declined stress testing at that time.  More recently, she was seen in the ED in 02/2019 with palpitations and noted to be in new onset A. fib with RVR with LBBB (EMS EKG).  She had spontaneously converted to sinus rhythm in the field following IV metoprolol.  She was noted to have a new left bundle which persisted in sinus rhythm.  High-sensitivity  troponin negative x2.  TSH mildly elevated at 5.041 with a potassium of 3.5.  She was placed on metoprolol and Eliquis.  Subsequent echo in 03/2019 showed an EF of 55 to 60%, no regional wall motion abnormalities, grade 2 diastolic dysfunction, normal RV systolic function and ventricular cavity size, normal size left atrium, and mildly elevated PASP.  She was last seen in the office in 03/2019 and was maintaining sinus rhythm.  No medication changes were made at that time.  She was seen by her PCP earlier this month noting an increase in palpitations and chest pressure.  In this setting she was advised to follow-up with cardiology.  She comes in today noting a 2-week history of increased palpitations described as "fluttering."  These episodes initially were occurring several times per day on a daily basis though over the past 2 to 3 days she is only had a couple of episodes total.  These episodes feel like her prior documented episode of A. fib and will typically last several minutes before spontaneous resolution.  There is some associated dizziness and shortness of breath with these episodes.  She also notes continued right axillary and right shoulder/upper arm discomfort that has been present for approximately 11 months.  She notes some substernal chest discomfort that has also been present over the past couple of weeks that is randomly occurring and not exertional.  Pain does not radiate and is not associated with any symptoms.  This pain does not correlate with her symptoms of palpitations.  She is currently chest pain-free.  At baseline she is  relatively sedentary secondary to back pain and deconditioning.  She denies any falls, hematochezia, or melena since she was last seen.  She remains on aspirin per vascular surgery as well as Eliquis, Lipitor, Zetia, and metoprolol from a cardiology perspective.   Labs independently reviewed: 09/2019 - TSH normal 08/2019 - Hgb 13.2, PLT 216 06/2019 - BUN 15, serum  creatinine 0.94, potassium 4.6, BUN 4.2, AST/ALT normal, TC 137, TG 128, HDL 38, LDL 73  Past Medical History:  Diagnosis Date  . Allergy   . Atrial fibrillation ([place])   . Basal cell carcinoma of nose 08/12/2017  . Carotid artery disease ([place])    s/p L. CEA in July 2016  . Centrilobular emphysema ([place]) 09/29/2017  . Collagen vascular disease ([place])    Left carotid stenosis  . COPD (chronic obstructive pulmonary disease) ([place])   . Coronary artery disease 09/29/2017   Noted on chest CT July 2019  . Elevated blood pressure (not hypertension)   . Fatty liver 09/29/2017   Chest CT July 2019  . GERD (gastroesophageal reflux disease)   . Hyperlipidemia   . Personal history of tobacco use, presenting hazards to health 08/31/2015  . PONV (postoperative nausea and vomiting)     Past Surgical History:  Procedure Laterality Date  . BREAST BIOPSY    . ENDARTERECTOMY Left 08/25/2014   Procedure: ENDARTERECTOMY CAROTID;  Surgeon: Algernon Huxley, MD;  Location: [facility] ORS;  Service: Vascular;  Laterality: Left;  . MOHS SURGERY    . MOHS SURGERY  09/23/2017   nose  . PERIPHERAL VASCULAR CATHETERIZATION N/A 07/20/2014   Procedure: Carotid Angiography;  Surgeon: Algernon Huxley, MD;  Location: [facility];  Service: Cardiovascular;  Laterality: N/A;  . TONSILLECTOMY    . TUBAL LIGATION      Current Medications: Current Meds  Medication Sig  . aspirin 81 MG tablet Take 81 mg by mouth daily.  [name] atorvastatin (LIPITOR) 20 MG tablet Take 1 tablet (20 mg total) by mouth daily.  . calcium carbonate (OS-CAL - DOSED IN MG OF ELEMENTAL CALCIUM) 1250 (500 Ca) MG tablet Take 1 tablet by mouth.  . cholecalciferol (VITAMIN D) 1000 units tablet Take 1,000 Units by mouth daily.  [name] ELIQUIS 5 MG TABS tablet TAKE 1 TABLET BY MOUTH TWICE A DAY  . ezetimibe (ZETIA) 10 MG tablet Take 1 tablet (10 mg total) by mouth daily. PLEASE CALL OFFICE TO SCHEDULE APPOINTMENT.  [name] Fish Oil-Cholecalciferol (FISH OIL + D3 PO) Take  1,000 mg by mouth 1 day or 1 dose.  . fluticasone (FLONASE) 50 MCG/ACT nasal spray Place 2 sprays into both nostrils daily.  . Fluticasone-Umeclidin-Vilant (TRELEGY ELLIPTA) 100-62.5-25 MCG/INH AEPB Inhale 1 puff into the lungs daily.  . metoprolol tartrate (LOPRESSOR) 25 MG tablet TAKE 1 TABLET BY MOUTH TWICE A DAY  . omeprazole (PRILOSEC) 40 MG capsule Take 1 capsule (40 mg total) by mouth as needed.  . pregabalin (LYRICA) 50 MG capsule Take 1 capsule (50 mg total) by mouth 3 (three) times daily.    Allergies:   Morphine and Morphine and related   Social History   Socioeconomic History  . Marital status: Divorced    Spouse name: Not on file  . Number of children: 2  . Years of education: Not on file  . Highest education level: 10th grade  Occupational History  . Occupation: Retired  Tobacco Use  . Smoking status: Former Smoker    Packs/day: 1.00    Years: 55.00  Pack years: 55.00    Types: Cigarettes    Quit date: 11/26/2016    Years since quitting: 3.1  . Smokeless tobacco: Current User    Types: Snuff  . Tobacco comment: smoking cessation materials not required  Vaping Use  . Vaping Use: Never used  Substance and Sexual Activity  . Alcohol use: No  . Drug use: No  . Sexual activity: Not Currently  Other Topics Concern  . Not on file  Social History Narrative    Pt lives alone   Social Determinants of Health   Financial Resource Strain: Low Risk   . Difficulty of Paying Living Expenses: Not hard at all  Food Insecurity: No Food Insecurity  . Worried About Charity fundraiser in the Last Year: Never true  . Ran Out of Food in the Last Year: Never true  Transportation Needs: No Transportation Needs  . Lack of Transportation (Medical): No  . Lack of Transportation (Non-Medical): No  Physical Activity: Inactive  . Days of Exercise per Week: 0 days  . Minutes of Exercise per Session: 0 min  Stress: No Stress Concern Present  . Feeling of Stress : Not at all    Social Connections: Moderately Isolated  . Frequency of Communication with Friends and Family: More than three times a week  . Frequency of Social Gatherings with Friends and Family: Three times a week  . Attends Religious Services: More than 4 times per year  . Active Member of Clubs or Organizations: No  . Attends Archivist Meetings: Never  . Marital Status: Divorced     Family History:  The patient's family history includes Alzheimer's disease in her brother; CVA in her father; Dementia in her mother; Diabetes in her brother and brother; HIV in her son; Heart attack in her mother; Hypercholesterolemia in her mother; Hypertension in her mother; Hypothyroidism in her mother; Kidney cancer in her sister; Liver cancer in her father; Lymphoma in her son; Other in her brother; Peripheral vascular disease in her mother. There is no history of Breast cancer.  ROS:   Review of Systems  Constitutional: Positive for malaise/fatigue. Negative for chills, diaphoresis, fever and weight loss.  HENT: Negative for congestion.   Eyes: Negative for discharge and redness.  Respiratory: Negative for cough, sputum production, shortness of breath and wheezing.   Cardiovascular: Positive for chest pain and palpitations. Negative for orthopnea, claudication, leg swelling and PND.  Gastrointestinal: Negative for abdominal pain, blood in stool, heartburn, melena, nausea and vomiting.  Musculoskeletal: Positive for back pain. Negative for falls and myalgias.  Skin: Negative for rash.  Neurological: Positive for weakness. Negative for dizziness, tingling, tremors, sensory change, speech change, focal weakness and loss of consciousness.  Endo/Heme/Allergies: Does not bruise/bleed easily.  Psychiatric/Behavioral: Negative for substance abuse. The patient is not nervous/anxious.   All other systems reviewed and are negative.    EKGs/Labs/Other Studies Reviewed:    Studies reviewed were summarized  above. The additional studies were reviewed today:  2D echo 03/2019: 1. Left ventricular ejection fraction, by visual estimation, is 55 to  60%. The left ventricle has normal function. There is no left ventricular  hypertrophy.  2. Left ventricular diastolic parameters are consistent with Grade II  diastolic dysfunction (pseudonormalization).  3. The left ventricle has no regional wall motion abnormalities.  4. Global right ventricle has normal systolic function.The right  ventricular size is normal. No increase in right ventricular wall  thickness.  5. Left atrial size was  normal.  6. Mildly elevated pulmonary artery systolic pressure.   EKG:  EKG is ordered today.  The EKG ordered today demonstrates NSR, 64 bpm, LBBB (known, dating back to 02/2019)  Recent Labs: 03/06/2019: Magnesium 2.0 07/12/2019: ALT 27; BUN 15; Creat 0.94; Potassium 4.6; Sodium 139 09/15/2019: Hemoglobin 13.2; Platelets 216 09/30/2019: TSH 3.99  Recent Lipid Panel    Component Value Date/Time   CHOL 137 07/12/2019 1112   CHOL 155 05/08/2015 0927   TRIG 188 (H) 07/12/2019 1112   HDL 38 (L) 07/12/2019 1112   HDL 30 (L) 05/08/2015 0927   CHOLHDL 3.6 07/12/2019 1112   LDLCALC 73 07/12/2019 1112    PHYSICAL EXAM:    VS:  BP 120/60 (BP Location: Left Arm, Patient Position: Sitting, Cuff Size: Normal)   Pulse 64   Ht 5\' 11"  (1.803 m)   Wt 170 lb 4 oz (77.2 kg)   SpO2 98%   BMI 23.75 kg/m   BMI: Body mass index is 23.75 kg/m.  Physical Exam Constitutional:      Appearance: She is well-developed.  HENT:     Head: Normocephalic and atraumatic.  Eyes:     General:        Right eye: No discharge.        Left eye: No discharge.  Neck:     Vascular: No JVD.  Cardiovascular:     Rate and Rhythm: Normal rate and regular rhythm.     Pulses: No midsystolic click and no opening snap.          Dorsalis pedis pulses are 2+ on the right side and 2+ on the left side.       Posterior tibial pulses are 2+ on  the right side and 2+ on the left side.     Heart sounds: Normal heart sounds, S1 normal and S2 normal. Heart sounds not distant. No murmur heard.  No friction rub.  Pulmonary:     Effort: Pulmonary effort is normal. No respiratory distress.     Breath sounds: Normal breath sounds. No decreased breath sounds, wheezing or rales.  Chest:     Chest wall: No tenderness.  Abdominal:     General: There is no distension.     Palpations: Abdomen is soft.     Tenderness: There is no abdominal tenderness.  Musculoskeletal:     Cervical back: Normal range of motion.  Skin:    General: Skin is warm and dry.     Nails: There is no clubbing.  Neurological:     Mental Status: She is alert and oriented to person, place, and time.  Psychiatric:        Speech: Speech normal.        Behavior: Behavior normal.        Thought Content: Thought content normal.        Judgment: Judgment normal.     Wt Readings from Last 3 Encounters:  01/05/20 170 lb 4 oz (77.2 kg)  12/31/19 169 lb 3.2 oz (76.7 kg)  12/02/19 167 lb 6.4 oz (75.9 kg)     ASSESSMENT & PLAN:   1. Chest pain with moderate risk for cardiac etiology: Currently chest pain-free.  Schedule Lexiscan MPI to evaluate for high risk ischemia.  Continue medical therapy as outlined below.  2. PAF: Maintaining sinus rhythm currently.  She does report an increase in palpitation burden consistent with what she has felt previously when she was documented to be in A. fib.  Place Zio  patch after Lexiscan MPI to evaluate for breakthrough A. fib with further recommendations regarding rate/rhythm control pending these results.  Given her CHA2DS2-VASc of at least 3 she remains on Eliquis.    3. LBBB: Echo earlier this year demonstrated preserved LVSF.  Schedule Lexiscan MPI as outlined above.  4. Coronary artery calcium: Continue current medical therapy including metoprolol, atorvastatin, and Zetia.  On Eliquis for A. fib and aspirin per vascular  surgery.  5. HLD: LDL 73 from 06/2019.  She remains on atorvastatin and Zetia.  6. Carotid artery disease: Status post left-sided CEA in 08/2014.  She remains on aspirin per vascular surgery.  Disposition: F/u with Dr. Rockey Situ or an APP in 6 weeks.   Medication Adjustments/Labs and Tests Ordered: Current medicines are reviewed at length with the patient today.  Concerns regarding medicines are outlined above. Medication changes, Labs and Tests ordered today are summarized above and listed in the Patient Instructions accessible in Encounters.   Signed, Christell Faith, PA-C 01/05/2020 1:35 PM     [address] [place] [place], [place] [address] [phone]

## 2020-01-05 ENCOUNTER — Ambulatory Visit (INDEPENDENT_AMBULATORY_CARE_PROVIDER_SITE_OTHER): Payer: Medicare Other | Admitting: Physician Assistant

## 2020-01-05 ENCOUNTER — Other Ambulatory Visit: Payer: Self-pay

## 2020-01-05 ENCOUNTER — Encounter: Payer: Self-pay | Admitting: Physician Assistant

## 2020-01-05 VITALS — BP 120/60 | HR 64 | Ht 71.0 in | Wt 170.2 lb

## 2020-01-05 DIAGNOSIS — I25118 Atherosclerotic heart disease of native coronary artery with other forms of angina pectoris: Secondary | ICD-10-CM

## 2020-01-05 DIAGNOSIS — I447 Left bundle-branch block, unspecified: Secondary | ICD-10-CM | POA: Diagnosis not present

## 2020-01-05 DIAGNOSIS — R002 Palpitations: Secondary | ICD-10-CM | POA: Diagnosis not present

## 2020-01-05 DIAGNOSIS — R079 Chest pain, unspecified: Secondary | ICD-10-CM | POA: Diagnosis not present

## 2020-01-05 DIAGNOSIS — I480 Paroxysmal atrial fibrillation: Secondary | ICD-10-CM | POA: Diagnosis not present

## 2020-01-05 DIAGNOSIS — E785 Hyperlipidemia, unspecified: Secondary | ICD-10-CM

## 2020-01-05 DIAGNOSIS — I6523 Occlusion and stenosis of bilateral carotid arteries: Secondary | ICD-10-CM

## 2020-01-05 NOTE — Patient Instructions (Addendum)
Medication Instructions:  Your physician recommends that you continue on your current medications as directed. Please refer to the Current Medication list given to you today.  *If you need a refill on your cardiac medications before your next appointment, please call your pharmacy*   Lab Work: None If you have labs (blood work) drawn today and your tests are completely normal, you will receive your results only by: [name] MyChart Message (if you have MyChart) OR . A paper copy in the mail If you have any lab test that is abnormal or we need to change your treatment, we will call you to review the results.   Testing/Procedures: Your physician has requested that you have a lexiscan myoview. For further information please visit [email or web address]. Please follow instruction sheet, as given.  Your physician has recommended that you wear a Zio monitor. (To be worn for 14 days) This monitor is a medical device that records the heart's electrical activity. Doctors most often use these monitors to diagnose arrhythmias. Arrhythmias are problems with the speed or rhythm of the heartbeat. The monitor is a small device applied to your chest. You can wear one while you do your normal daily activities. While wearing this monitor if you have any symptoms to push the button and record what you felt. Once you have worn this monitor for the period of time provider prescribed (Usually 14 days), you will return the monitor device in the postage paid box. Once it is returned they will download the data collected and provide [place] with a report which the provider will then review and we will call you with those results. Important tips:  1. Avoid showering during the first 24 hours of wearing the monitor. 2. Avoid excessive sweating to help maximize wear time. 3. Do not submerge the device, no hot tubs, and no swimming pools. 4. Keep any lotions or oils away from the patch. 5. After 24 hours you may shower with the patch  on. Take brief showers with your back facing the shower head.  6. Do not remove patch once it has been placed because that will interrupt data and decrease adhesive wear time. 7. Push the button when you have any symptoms and write down what you were feeling. 8. Once you have completed wearing your monitor, remove and place into box which has postage paid and place in your outgoing mailbox.  9. If for some reason you have misplaced your box then call our office and we can provide another box and/or mail it off for you.         Follow-Up: At [facility], you and your health needs are our priority.  As part of our continuing mission to provide you with exceptional heart care, we have created designated Provider Care Teams.  These Care Teams include your primary Cardiologist (physician) and Advanced Practice Providers (APPs -  Physician Assistants and Nurse Practitioners) who all work together to provide you with the care you need, when you need it.  We recommend signing up for the patient portal called "MyChart".  Sign up information is provided on this After Visit Summary.  MyChart is used to connect with patients for Virtual Visits (Telemedicine).  Patients are able to view lab/test results, encounter notes, upcoming appointments, etc.  Non-urgent messages can be sent to your provider as well.   To learn more about what you can do with MyChart, go to [email or web address].    Your next appointment:   6 week(s)  The format for  your next appointment:   In Person  Provider:   You may see Ida Rogue, MD or one of the following Advanced Practice Providers on your designated Care Team:    Murray Hodgkins, NP  Christell Faith, PA-C  Marrianne Mood, PA-C  Cadence Kathlen Mody, [place]  Laurann Montana, NP    Other Instructions Please back to the [facility] after your stress test to have your Zio patch (heart monitor) to be placed so we may observe your palpitations.A-fib for 14  days.   Angie  Your caregiver has ordered a Stress Test with nuclear imaging. The purpose of this test is to evaluate the blood supply to your heart muscle. This procedure is referred to as a "Non-Invasive Stress Test." This is because other than having an IV started in your vein, nothing is inserted or "invades" your body. Cardiac stress tests are done to find areas of poor blood flow to the heart by determining the extent of coronary artery disease (CAD). Some patients exercise on a treadmill, which naturally increases the blood flow to your heart, while others who are  unable to walk on a treadmill due to physical limitations have a pharmacologic/chemical stress agent called Lexiscan . This medicine will mimic walking on a treadmill by temporarily increasing your coronary blood flow.   Please note: these test may take anywhere between 2-4 hours to complete  PLEASE REPORT TO [place] AT THE FIRST DESK WILL DIRECT YOU WHERE TO GO  Date of Procedure:_____________________________________  Arrival Time for Procedure:______________________________  Instructions regarding medication:   __X__:  Hold betablocker(Metoprolol) morning of procedure  PLEASE NOTIFY THE OFFICE AT LEAST 24 HOURS IN ADVANCE IF YOU ARE UNABLE TO KEEP YOUR APPOINTMENT.  [phone] AND  PLEASE NOTIFY NUCLEAR MEDICINE AT [facility] AT LEAST 24 HOURS IN ADVANCE IF YOU ARE UNABLE TO KEEP YOUR APPOINTMENT. [phone]  How to prepare for your Myoview test:  1. Do not eat or drink after midnight 2. No caffeine for 24 hours prior to test 3. No smoking 24 hours prior to test. 4. Your medication may be taken with water.  If your doctor stopped a medication because of this test, do not take that medication. 5. Ladies, please do not wear dresses.  Skirts or pants are appropriate. Please wear a short sleeve shirt. 6. No perfume, cologne or lotion. 7. Wear comfortable walking shoes. No  heels!

## 2020-01-11 ENCOUNTER — Other Ambulatory Visit: Payer: Self-pay

## 2020-01-11 ENCOUNTER — Encounter
Admission: RE | Admit: 2020-01-11 | Discharge: 2020-01-11 | Disposition: A | Discharge status: Home / Self Care | Payer: Medicare Other | Source: Ambulatory Visit | Attending: Physician Assistant | Admitting: Physician Assistant

## 2020-01-11 ENCOUNTER — Ambulatory Visit (INDEPENDENT_AMBULATORY_CARE_PROVIDER_SITE_OTHER): Payer: Medicare Other

## 2020-01-11 DIAGNOSIS — R002 Palpitations: Secondary | ICD-10-CM

## 2020-01-11 DIAGNOSIS — R079 Chest pain, unspecified: Secondary | ICD-10-CM | POA: Insufficient documentation

## 2020-01-11 DIAGNOSIS — I480 Paroxysmal atrial fibrillation: Secondary | ICD-10-CM

## 2020-01-11 LAB — NM MYOCAR MULTI W/SPECT W/WALL MOTION / EF
LV dias vol: 38 mL (ref 46–106)
LV sys vol: 15 mL
Peak HR: 80 {beats}/min
Percent HR: 54 %
Rest HR: 56 {beats}/min
SDS: 0
SRS: 0
SSS: 0
TID: 0.96

## 2020-01-11 MED ORDER — TECHNETIUM TC 99M TETROFOSMIN IV KIT
31.9800 | PACK | Freq: Once | INTRAVENOUS | Status: AC | PRN
  Administered 2020-01-11: 31.98 via INTRAVENOUS

## 2020-01-11 MED ORDER — TECHNETIUM TC 99M TETROFOSMIN IV KIT
10.0000 | PACK | Freq: Once | INTRAVENOUS | Status: AC | PRN
  Administered 2020-01-11: 10.93 via INTRAVENOUS

## 2020-01-11 MED ORDER — REGADENOSON 0.4 MG/5ML IV SOLN
0.4000 mg | Freq: Once | INTRAVENOUS | Status: AC
  Administered 2020-01-11: 0.4 mg via INTRAVENOUS
  Filled 2020-01-11: qty 5

## 2020-02-09 ENCOUNTER — Ambulatory Visit: Payer: Self-pay

## 2020-02-09 DIAGNOSIS — J449 Chronic obstructive pulmonary disease, unspecified: Secondary | ICD-10-CM

## 2020-02-09 DIAGNOSIS — I25119 Atherosclerotic heart disease of native coronary artery with unspecified angina pectoris: Secondary | ICD-10-CM

## 2020-02-09 NOTE — Patient Instructions (Addendum)
  Goals Addressed            This Visit's Progress   . COMPLETED: Chronic Disease Management       CARE PLAN ENTRY (see longitudinal plan of care for additional care plan information)  Current Barriers:  . Chronic Disease Management support and education needs related to CAD, COPD and GERD.  Case Manager Clinical Goal(s):  [name] Over the next 120 days, patient will not require hospitalization or emergent care d/t chronic disease related complications.-Complete . Over the next 90 days, patient will monitor blood pressure and oxygen saturations routinely and record readings.-Complete . Over the next 90 days, patient will take all medications as prescribed.-Complete . Over the next 90 days, patient will follow recommended safety measures to prevent falls and injuries.-Complete . Over the next 90 days, patient will attend all scheduled medical appointments.-Complete   Interventions:  . Inter-disciplinary care team collaboration (see longitudinal plan of care) . Discussed care management needs. Ms. Martire has met her care management goals. Remains compliant with medications and treatment recommendations. Denies concerns regarding prescription cost. She remains independent. Denies decline in activity tolerance or need for in-home assistance. She agreed to call if her health needs change and additional outreach is required.    Patient Self Care Activities:  . Self administers medications as prescribed . Attends scheduled provider appointments . Calls pharmacy for medication refills . Performs ADL's independently . Performs IADL's independently   Please see past updates related to this goal by clicking on the "Past Updates" button in the selected goal         Ms. Robards has met her care management goals. She agreed to call if her health needs change and additional outreach is required.    Cristy [facility] Care Management [facility] [phone]

## 2020-02-09 NOTE — Chronic Care Management (AMB) (Signed)
Chronic Care Management   Follow Up Note   02/09/2020 Name: ZAKARIAH DEJARNETTE MRN: [id number] DOB: 13-May-1945  Primary Care Provider: Steele Sizer, MD Reason for referral : Chronic Care Management  SHANIQUE ASLINGER is a 74 y.o. year old female who is a primary care patient of Steele Sizer, MD.  A routine telephonic outreach was conducted today. She has met her care management goals.  Review of Ms. Delia status, including review of consultants reports, relevant labs and test results was conducted today. Collaboration with appropriate care team members was performed as part of the comprehensive evaluation and provision of chronic care management services.    SDOH (Social Determinants of Health) assessments performed: No    Outpatient Encounter Medications as of 02/09/2020  Medication Sig  . aspirin 81 MG tablet Take 81 mg by mouth daily.  [name] atorvastatin (LIPITOR) 20 MG tablet Take 1 tablet (20 mg total) by mouth daily.  . calcium carbonate (OS-CAL - DOSED IN MG OF ELEMENTAL CALCIUM) 1250 (500 Ca) MG tablet Take 1 tablet by mouth.  . cholecalciferol (VITAMIN D) 1000 units tablet Take 1,000 Units by mouth daily.  [name] ELIQUIS 5 MG TABS tablet TAKE 1 TABLET BY MOUTH TWICE A DAY  . ezetimibe (ZETIA) 10 MG tablet Take 1 tablet (10 mg total) by mouth daily. PLEASE CALL OFFICE TO SCHEDULE APPOINTMENT.  [name] Fish Oil-Cholecalciferol (FISH OIL + D3 PO) Take 1,000 mg by mouth 1 day or 1 dose.  . fluticasone (FLONASE) 50 MCG/ACT nasal spray Place 2 sprays into both nostrils daily.  . metoprolol tartrate (LOPRESSOR) 25 MG tablet TAKE 1 TABLET BY MOUTH TWICE A DAY  . omeprazole (PRILOSEC) 40 MG capsule Take 1 capsule (40 mg total) by mouth as needed.  . pregabalin (LYRICA) 50 MG capsule Take 1 capsule (50 mg total) by mouth 3 (three) times daily.   No facility-administered encounter medications on file as of 02/09/2020.     Goals Addressed            This Visit's Progress   . COMPLETED: Chronic  Disease Management       CARE PLAN ENTRY (see longitudinal plan of care for additional care plan information)  Current Barriers:  . Chronic Disease Management support and education needs related to CAD, COPD and GERD.  Case Manager Clinical Goal(s):  [name] Over the next 120 days, patient will not require hospitalization or emergent care d/t chronic disease related complications.-Complete . Over the next 90 days, patient will monitor blood pressure and oxygen saturations routinely and record readings.-Complete . Over the next 90 days, patient will take all medications as prescribed.-Complete . Over the next 90 days, patient will follow recommended safety measures to prevent falls and injuries.-Complete . Over the next 90 days, patient will attend all scheduled medical appointments.-Complete   Interventions:  . Inter-disciplinary care team collaboration (see longitudinal plan of care) . Discussed care management needs. Ms. Shankles has met her care management goals. Remains compliant with medications and treatment recommendations. Denies concerns regarding prescription cost. She remains independent. Denies decline in activity tolerance or need for in-home assistance. She agreed to call if her health needs change and additional outreach is required.    Patient Self Care Activities:  . Self administers medications as prescribed . Attends scheduled provider appointments . Calls pharmacy for medication refills . Performs ADL's independently . Performs IADL's independently   Please see past updates related to this goal by clicking on the "Past Updates" button in the selected  goal          PLAN Ms. Sandoval has met her care management goals. She agreed to call if her health needs change and additional outreach is required.    [facility] [phone]

## 2020-02-14 NOTE — Progress Notes (Deleted)
Cardiology Office Note    Date:  02/14/2020   ID:  Kristie Walters, DOB Sep 10, 1945, MRN [id number]  PCP:  Steele Sizer, MD  Cardiologist:  Ida Rogue, MD  Electrophysiologist:  None   Chief Complaint: Follow up  History of Present Illness:   Kristie Walters is a 74 y.o. 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  She was previously followed by Dr. Clayborn Bigness though subsequently transitioned to Dr. Rockey Situ in 09/2017.  Prior echo from 09/2015 done by outside cardiology group showed an EF greater than 55%, mild LVH, normal RV systolic function, mild MR/TR. Nuclear stress test at that time showed an EF of 77% with no evidence of significant ischemia or scar. She was referred to [facility] in 09/2017 for evaluation of incidentally noted aortic atherosclerosis and coronary artery calcium along the LAD on noninvasive imaging. She was noted to not be very active at baseline secondary to back pain. She did note some rare palpitations associated with stress that improved with rest. Aggressive primary prevention was recommended. She declined stress testing at that time.  More recently, she was seen in the ED in 02/2019 with palpitations and noted to be in new onset A. fib with RVR with LBBB (EMS EKG).  She had spontaneously converted to sinus rhythm in the field following IV metoprolol.  She was noted to have a new left bundle which persisted in sinus rhythm.  High-sensitivity troponin negative  x2.  TSH mildly elevated at 5.041 with a potassium of 3.5.  She was placed on metoprolol and Eliquis.  Subsequent echo in 03/2019 showed an EF of 55 to 60%, no regional wall motion abnormalities, grade 2 diastolic dysfunction, normal RV systolic function and ventricular cavity size, normal size left atrium, and mildly elevated PASP.  She was seen in the office in 03/2019 and was maintaining sinus rhythm.  No medication changes were made at that time.  She was last seen on 01/05/2020 noting a 2-week history of increased palpitations as well as some associated dizziness, shortness of breath, and chest discomfort.  Lexiscan MPI on 01/11/2020 showed no evidence of significant ischemia or scar with a hyperdynamic LVEF greater than 65%.  With administration of regadenoson she developed a LBBB with known intermittent LBBB.  Attenuation corrected CT images noted aortic atherosclerosis with no significant coronary artery calcification.  Overall, this was a low risk study.  Zio patch showed a predominant rhythm of sinus with an average heart rate of 65 bpm.  First-degree AV block was noted.  18 episodes of SVT were noted with the longest interval lasting 10 beats with an average rate of 109 bpm and the fastest interval lasting 4 beats with a maximum rate of 158 bpm.  Isolated PACs, atrial couplets, atrial triplets, and PVCs were noted.  There were 6 patient triggered events and these were not associated with significant arrhythmia.  ***   Labs independently reviewed: 09/2019 - TSH normal 08/2019 - Hgb 13.2, PLT 216 06/2019 - BUN 15, serum creatinine 0.94, potassium 4.6, BUN 4.2,  AST/ALT normal, TC 137, TG 128, HDL 38, LDL 73  Past Medical History:  Diagnosis Date  . Allergy   . Atrial fibrillation (HCC)   . Basal cell carcinoma of nose 08/12/2017  . Carotid artery disease (HCC)    s/p L. CEA in July 2016  . Centrilobular emphysema (HCC) 09/29/2017  . Collagen vascular disease (HCC)    Left carotid stenosis  .  COPD (chronic obstructive pulmonary disease) (HCC)   . Coronary artery disease 09/29/2017   Noted on chest CT July 2019  . Elevated blood pressure (not hypertension)   . Fatty liver 09/29/2017   Chest CT July 2019  . GERD (gastroesophageal reflux disease)   . Hyperlipidemia   . Personal history of tobacco use, presenting hazards to health 08/31/2015  . PONV (postoperative nausea and vomiting)     Past Surgical History:  Procedure Laterality Date  . BREAST BIOPSY    . ENDARTERECTOMY Left 08/25/2014   Procedure: ENDARTERECTOMY CAROTID;  Surgeon: Annice Needy, MD;  Location: [facility] ORS;  Service: Vascular;  Laterality: Left;  . MOHS SURGERY    . MOHS SURGERY  09/23/2017   nose  . PERIPHERAL VASCULAR CATHETERIZATION N/A 07/20/2014   Procedure: Carotid Angiography;  Surgeon: Annice Needy, MD;  Location: [facility] INVASIVE CV LAB;  Service: Cardiovascular;  Laterality: N/A;  . TONSILLECTOMY    . TUBAL LIGATION      Current Medications: No outpatient medications have been marked as taking for the 02/21/20 encounter (Appointment) with Sondra Barges, PA-C.    Allergies:   Morphine and Morphine and related   Social History   Socioeconomic History  . Marital status: Divorced    Spouse name: Not on file  . Number of children: 2  . Years of education: Not on file  . Highest education level: 10th grade  Occupational History  . Occupation: Retired  Tobacco Use  . Smoking status: Former Smoker    Packs/day: 1.00    Years: 55.00    Pack years: 55.00    Types: Cigarettes    Quit date: 11/26/2016    Years since quitting: 3.2  . Smokeless tobacco: Current User    Types: Snuff  . Tobacco comment: smoking cessation materials not required  Vaping Use  . Vaping Use: Never used  Substance and Sexual Activity  . Alcohol use: No  . Drug use: No  . Sexual activity: Not Currently  Other Topics Concern  . Not on file  Social History Narrative    Pt lives alone   Social Determinants of Health    Financial Resource Strain: Low Risk   . Difficulty of Paying Living Expenses: Not hard at all  Food Insecurity: No Food Insecurity  . Worried About Programme researcher, broadcasting/film/video in the Last Year: Never true  . Ran Out of Food in the Last Year: Never true  Transportation Needs: No Transportation Needs  . Lack of Transportation (Medical): No  . Lack of Transportation (Non-Medical): No  Physical Activity: Inactive  . Days of Exercise per Week: 0 days  . Minutes of Exercise per Session: 0 min  Stress: No Stress Concern Present  . Feeling of Stress : Not at all  Social Connections: Moderately Isolated  . Frequency of Communication with Friends and Family: More than three times a week  . Frequency of Social Gatherings with Friends and Family: Three times a week  . Attends Religious Services: More than 4 times per year  . Active Member of  Clubs or Organizations: No  . Attends Banker Meetings: Never  . Marital Status: Divorced     Family History:  The patient's family history includes Alzheimer's disease in her brother; CVA in her father; Dementia in her mother; Diabetes in her brother and brother; HIV in her son; Heart attack in her mother; Hypercholesterolemia in her mother; Hypertension in her mother; Hypothyroidism in her mother; Kidney cancer in her sister; Liver cancer in her father; Lymphoma in her son; Other in her brother; Peripheral vascular disease in her mother. There is no history of Breast cancer.  ROS:   ROS   EKGs/Labs/Other Studies Reviewed:    Studies reviewed were summarized above. The additional studies were reviewed today:  Lexiscan MPI 01/11/2020:  Normal pharmacologic myocardial perfusion stress test without evidence of significant ischemia or scar.  The left ventricular ejection fraction is hyperdynamic (>65%).  Left bundle branch block developed after administration of regadenoson. Patient known to have intermittent LBBB.  Attenuation correction CT is  notable for aortic atherosclerosis. There is no significant coronary artery calcification.  This is a low risk study. __________  Luci Bank patch 12/2019: Normal sinus rhythm avg HR of 65 bpm.   Patient triggered events (6) were not associated with significant arrhythmia.  First Degree AV Block was present.  18 Supraventricular Tachycardia runs occurred, the run with the fastest interval lasting 4 beats with a max rate of 158 bpm, the longest lasting 10 beats with an avg rate of 109 bpm.  Isolated SVEs were rare (<1.0%), SVE Couplets were rare (<1.0%), and SVE Triplets were rare (<1.0%). Isolated VEs were rare (<1.0%), and no VE Couplets or VE Triplets were present. __________  2D echo 03/2019: 1. Left ventricular ejection fraction, by visual estimation, is 55 to  60%. The left ventricle has normal function. There is no left ventricular  hypertrophy.  2. Left ventricular diastolic parameters are consistent with Grade II  diastolic dysfunction (pseudonormalization).  3. The left ventricle has no regional wall motion abnormalities.  4. Global right ventricle has normal systolic function.The right  ventricular size is normal. No increase in right ventricular wall  thickness.  5. Left atrial size was normal.  6. Mildly elevated pulmonary artery systolic pressure.   EKG:  EKG is ordered today.  The EKG ordered today demonstrates ***  Recent Labs: 03/06/2019: Magnesium 2.0 07/12/2019: ALT 27; BUN 15; Creat 0.94; Potassium 4.6; Sodium 139 09/15/2019: Hemoglobin 13.2; Platelets 216 09/30/2019: TSH 3.99  Recent Lipid Panel    Component Value Date/Time   CHOL 137 07/12/2019 1112   CHOL 155 05/08/2015 0927   TRIG 188 (H) 07/12/2019 1112   HDL 38 (L) 07/12/2019 1112   HDL 30 (L) 05/08/2015 0927   CHOLHDL 3.6 07/12/2019 1112   LDLCALC 73 07/12/2019 1112    PHYSICAL EXAM:    VS:  There were no vitals taken for this visit.  BMI: There is no height or weight on file to calculate  BMI.  Physical Exam  Wt Readings from Last 3 Encounters:  01/05/20 170 lb 4 oz (77.2 kg)  12/31/19 169 lb 3.2 oz (76.7 kg)  12/02/19 167 lb 6.4 oz (75.9 kg)     ASSESSMENT & PLAN:   1. ***  Disposition: F/u with Dr. Mariah Milling or an APP in ***.   Medication Adjustments/Labs and Tests Ordered: Current medicines are reviewed at length with the patient today.  Concerns regarding medicines are outlined above. Medication changes, Labs and Tests ordered today are summarized above  and listed in the Patient Instructions accessible in Encounters.   Signed, Christell Faith, PA-C 02/14/2020 9:50 AM     [facility] [place] [address] [place] [address] [place], [place] [address] [phone]

## 2020-02-21 ENCOUNTER — Ambulatory Visit: Payer: Medicare Other | Admitting: Physician Assistant

## 2020-02-21 NOTE — Progress Notes (Signed)
Cardiology Office Note    Date:  02/24/2020   ID:  Kristie Walters, DOB [birth year], MRN [id number]  PCP:  Steele Sizer, MD  Cardiologist:  Ida Rogue, MD  Electrophysiologist:  None   Chief Complaint: Follow up  History of Present Illness:   Kristie Walters is a 75 y.o. female with history of coronary artery calcium noted on prior noninvasive imaging, PAF diagnosed in 02/2019 on Eliquis,  diastolic dysfunction, intermittent LBBB, COPD secondary to prior tobacco use with a 56-pack-year history quitting in 11/2017, carotid artery disease status post left-sided CEA in 08/2014 followed by vascular surgery with carotid artery ultrasound from 07/2018 showing bilateral 1 to 39% ICA stenosis, collagen vascular disease, fatty liver disease, and basal cell carcinoma of the nose status post Mohs procedure who presents for  follow-up of recent Lexiscan MPI and Zio patch.  She was previously followed by Dr. Clayborn Bigness though subsequently transitioned to Dr. Rockey Situ in 09/2017.  Prior echo from 09/2015, done by outside cardiology group, showed an EF greater than 55%, mild LVH, normal RV systolic function, mild MR/TR. Nuclear stress test at that time showed an EF of 77% with no evidence of significant ischemia or scar. She was referred to [facility] in 09/2017 for evaluation of incidentally noted aortic atherosclerosis and coronary artery calcium along the LAD on noninvasive imaging. She was noted to not be very active at baseline secondary to back pain. She did note some rare palpitations associated with stress that improved with rest. Aggressive primary prevention was recommended. She declined stress testing at that time.  More recently, she was seen in the ED in 02/2019 with palpitations and noted to be in new onset A. fib with RVR with LBBB (EMS EKG).  She had spontaneously converted to sinus rhythm in the field following IV metoprolol.  She was noted to have a new left bundle which persisted in sinus  rhythm.  High-sensitivity troponin negative x2.  TSH mildly elevated at 5.041 with a potassium of 3.5.  She was placed on metoprolol and Eliquis.  Subsequent echo in 03/2019 showed an EF of 55 to 60%, no regional wall motion abnormalities, grade 2 diastolic dysfunction, normal RV systolic function and ventricular cavity size, normal size left atrium, and mildly elevated PASP.  She was seen in the office in 03/2019 and was maintaining sinus rhythm.  No medication changes were made at that time.  She was last seen on 01/05/2020 noting a 2-week history of increased palpitations as well as some associated dizziness, shortness of breath, and chest discomfort.  Lexiscan MPI on 01/11/2020 showed no evidence of significant ischemia or scar with a hyperdynamic LVEF greater than 65%.  With administration of regadenoson she developed a LBBB with known intermittent LBBB.  Attenuation corrected CT images noted aortic atherosclerosis with no significant coronary artery calcification.  Overall, this was a low risk study.  Zio patch showed a predominant rhythm of sinus with an average heart rate of 65 bpm.  First-degree AV block was noted.  18 episodes of SVT were noted with the longest interval lasting 10 beats with an average rate of 109 bpm and the fastest interval lasting 4 beats with a maximum rate of 158 bpm.  Isolated PACs, atrial couplets, atrial triplets, and PVCs were noted.  There were 6 patient triggered events and these were not associated with significant arrhythmia.  She comes in doing reasonably well from a cardiac perspective.  Since she was last seen she has not had any further  chest pain.  She also feels like her dyspnea is improved.  She has not had any significant palpitations though does occasionally note these.  She does continue to note some fatigue and unsteadiness.  Some of this has been attributed to underlying peripheral neuropathy for which she takes Lyrica.  She did have 1 fall on Christmas night,  tripping over her dog and rug.  She did not hit her head or suffer LOC.  The rug has subsequently been removed from her house.  She is concerned that [place] during [place] day hours as all of the other families work.  Her family has discussed the possibility of obtaining a medical alert button and she is considering this.  She denies any hematochezia or melena.  No presyncope or syncope.   Labs independently reviewed: 09/2019 - TSH normal 08/2019 - Hgb 13.2, PLT 216 06/2019 - BUN 15, serum creatinine 0.94, potassium 4.6, BUN 4.2, AST/ALT normal, TC 137, TG 128, HDL 38, LDL 73   Past Medical History:  Diagnosis Date  . Allergy   . Atrial fibrillation ([place])   . Basal cell carcinoma of nose 08/12/2017  . Carotid artery disease ([place])    s/p L. CEA in July 2016  . Centrilobular emphysema ([place]) 09/29/2017  . Collagen vascular disease ([place])    Left carotid stenosis  . COPD (chronic obstructive pulmonary disease) ([place])   . Coronary artery disease 09/29/2017   Noted on chest CT July 2019  . Elevated blood pressure (not hypertension)   . Fatty liver 09/29/2017   Chest CT July 2019  . GERD (gastroesophageal reflux disease)   . Hyperlipidemia   . Personal history of tobacco use, presenting hazards to health 08/31/2015  . PONV (postoperative nausea and vomiting)     Past Surgical History:  Procedure Laterality Date  . BREAST BIOPSY    . ENDARTERECTOMY Left 08/25/2014   Procedure: ENDARTERECTOMY CAROTID;  Surgeon: Algernon Huxley, MD;  Location: [facility] ORS;  Service: Vascular;  Laterality: Left;  . MOHS SURGERY    . MOHS SURGERY  09/23/2017   nose  . PERIPHERAL VASCULAR CATHETERIZATION N/A 07/20/2014   Procedure: Carotid Angiography;  Surgeon: Algernon Huxley, MD;  Location: [facility];  Service: Cardiovascular;  Laterality: N/A;  . TONSILLECTOMY    . TUBAL LIGATION      Current Medications: Current Meds  Medication Sig  . aspirin 81 MG tablet Take 81 mg by mouth daily.   [name] atorvastatin (LIPITOR) 20 MG tablet Take 1 tablet (20 mg total) by mouth daily.  . calcium carbonate (OS-CAL - DOSED IN MG OF ELEMENTAL CALCIUM) 1250 (500 Ca) MG tablet Take 1 tablet by mouth.  . cholecalciferol (VITAMIN D) 1000 units tablet Take 1,000 Units by mouth daily.  [name] ELIQUIS 5 MG TABS tablet TAKE 1 TABLET BY MOUTH TWICE A DAY  . ezetimibe (ZETIA) 10 MG tablet Take 1 tablet (10 mg total) by mouth daily. PLEASE CALL OFFICE TO SCHEDULE APPOINTMENT.  [name] Fish Oil-Cholecalciferol (FISH OIL + D3 PO) Take 1,000 mg by mouth 1 day or 1 dose.  . fluticasone (FLONASE) 50 MCG/ACT nasal spray Place 2 sprays into both nostrils daily.  [name] omeprazole (PRILOSEC) 40 MG capsule Take 1 capsule (40 mg total) by mouth as needed.  . pregabalin (LYRICA) 50 MG capsule Take 1 capsule (50 mg total) by mouth 3 (three) times daily.  . [DISCONTINUED] metoprolol tartrate (LOPRESSOR) 25 MG tablet TAKE 1 TABLET BY  MOUTH TWICE A DAY    Allergies:   Morphine and Morphine and related   Social History   Socioeconomic History  . Marital status: Divorced    Spouse name: Not on file  . Number of children: 2  . Years of education: Not on file  . Highest education level: 10th grade  Occupational History  . Occupation: Retired  Tobacco Use  . Smoking status: Former Smoker    Packs/day: 1.00    Years: 55.00    Pack years: 55.00    Types: Cigarettes    Quit date: 11/26/2016    Years since quitting: 3.2  . Smokeless tobacco: Current User    Types: Snuff  . Tobacco comment: smoking cessation materials not required  Vaping Use  . Vaping Use: Never used  Substance and Sexual Activity  . Alcohol use: No  . Drug use: No  . Sexual activity: Not Currently  Other Topics Concern  . Not on file  Social History Narrative    Pt lives alone   Social Determinants of Health   Financial Resource Strain: Low Risk   . Difficulty of Paying Living Expenses: Not hard at all  Food Insecurity: No Food Insecurity  . Worried  About Programme researcher, broadcasting/film/video in the Last Year: Never true  . Ran Out of Food in the Last Year: Never true  Transportation Needs: No Transportation Needs  . Lack of Transportation (Medical): No  . Lack of Transportation (Non-Medical): No  Physical Activity: Inactive  . Days of Exercise per Week: 0 days  . Minutes of Exercise per Session: 0 min  Stress: No Stress Concern Present  . Feeling of Stress : Not at all  Social Connections: Moderately Isolated  . Frequency of Communication with Friends and Family: More than three times a week  . Frequency of Social Gatherings with Friends and Family: Three times a week  . Attends Religious Services: More than 4 times per year  . Active Member of Clubs or Organizations: No  . Attends Banker Meetings: Never  . Marital Status: Divorced     Family History:  The patient's family history includes Alzheimer's disease in her brother; CVA in her father; Dementia in her mother; Diabetes in her brother and brother; HIV in her son; Heart attack in her mother; Hypercholesterolemia in her mother; Hypertension in her mother; Hypothyroidism in her mother; Kidney cancer in her sister; Liver cancer in her father; Lymphoma in her son; Other in her brother; Peripheral vascular disease in her mother. There is no history of Breast cancer.  ROS:   Review of Systems  Constitutional: Positive for malaise/fatigue. Negative for chills, diaphoresis, fever and weight loss.  HENT: Negative for congestion.   Eyes: Negative for discharge and redness.  Respiratory: Positive for shortness of breath. Negative for cough, sputum production and wheezing.        Improving dyspnea  Cardiovascular: Negative for chest pain, palpitations, orthopnea, claudication, leg swelling and PND.  Gastrointestinal: Negative for abdominal pain, blood in stool, heartburn, melena, nausea and vomiting.  Musculoskeletal: Positive for falls. Negative for myalgias.  Skin: Negative for rash.   Neurological: Positive for tingling, sensory change and weakness. Negative for dizziness, tremors, speech change, focal weakness and loss of consciousness.  Endo/Heme/Allergies: Does not bruise/bleed easily.  Psychiatric/Behavioral: Negative for substance abuse. The patient is not nervous/anxious.   All other systems reviewed and are negative.    EKGs/Labs/Other Studies Reviewed:    Studies reviewed were summarized above. The  additional studies were reviewed today:  Lexiscan MPI 01/11/2020:  Normal pharmacologic myocardial perfusion stress test without evidence of significant ischemia or scar.  The left ventricular ejection fraction is hyperdynamic (>65%).  Left bundle branch block developed after administration of regadenoson. Patient known to have intermittent LBBB.  Attenuation correction CT is notable for aortic atherosclerosis. There is no significant coronary artery calcification.  This is a low risk study. __________  [place] patch 12/2019: Normal sinus rhythm avg HR of 65 bpm.   Patient triggered events (6) were not associated with significant arrhythmia.  First Degree AV Block was present.  18 Supraventricular Tachycardia runs occurred, the run with the fastest interval lasting 4 beats with a max rate of 158 bpm, the longest lasting 10 beats with an avg rate of 109 bpm.  Isolated SVEs were rare (<1.0%), SVE Couplets were rare (<1.0%), and SVE Triplets were rare (<1.0%). Isolated VEs were rare (<1.0%), and no VE Couplets or VE Triplets were present. __________  2D echo 03/2019: 1. Left ventricular ejection fraction, by visual estimation, is 55 to  60%. The left ventricle has normal function. There is no left ventricular  hypertrophy.  2. Left ventricular diastolic parameters are consistent with Grade II  diastolic dysfunction (pseudonormalization).  3. The left ventricle has no regional wall motion abnormalities.  4. Global right ventricle has normal systolic  function.The right  ventricular size is normal. No increase in right ventricular wall  thickness.  5. Left atrial size was normal.  6. Mildly elevated pulmonary artery systolic pressure.   EKG:  EKG is ordered today.  The EKG ordered today demonstrates sinus bradycardia, 53 bpm, normal axis, nonspecific ST-T changes in leads V2 and V3, possibly related to lead placement, no acute ST-T changes, when compared to prior tracing LBBB is not present  Recent Labs: 03/06/2019: Magnesium 2.0 07/12/2019: ALT 27; BUN 15; Creat 0.94; Potassium 4.6; Sodium 139 09/15/2019: Hemoglobin 13.2; Platelets 216 09/30/2019: TSH 3.99  Recent Lipid Panel    Component Value Date/Time   CHOL 137 07/12/2019 1112   CHOL 155 05/08/2015 0927   TRIG 188 (H) 07/12/2019 1112   HDL 38 (L) 07/12/2019 1112   HDL 30 (L) 05/08/2015 0927   CHOLHDL 3.6 07/12/2019 1112   LDLCALC 73 07/12/2019 1112    PHYSICAL EXAM:    VS:  BP 120/68 (BP Location: Left Arm, Patient Position: Sitting, Cuff Size: Normal)   Pulse (!) 53   Ht 5\' 11"  (1.803 m)   Wt 171 lb 2 oz (77.6 kg)   SpO2 98%   BMI 23.87 kg/m   BMI: Body mass index is 23.87 kg/m.  Physical Exam Vitals reviewed.  Constitutional:      Appearance: She is well-developed and well-nourished.  HENT:     Head: Normocephalic and atraumatic.  Eyes:     General:        Right eye: No discharge.        Left eye: No discharge.  Neck:     Vascular: No JVD.  Cardiovascular:     Rate and Rhythm: Normal rate and regular rhythm.     Pulses: No midsystolic click and no opening snap.          Posterior tibial pulses are 2+ on the right side and 2+ on the left side.     Heart sounds: Normal heart sounds, S1 normal and S2 normal. Heart sounds not distant. No murmur heard. No friction rub.  Pulmonary:     Effort: Pulmonary effort  is normal. No respiratory distress.     Breath sounds: Normal breath sounds. No decreased breath sounds, wheezing or rales.  Chest:     Chest wall:  No tenderness.  Abdominal:     General: There is no distension.     Palpations: Abdomen is soft.     Tenderness: There is no abdominal tenderness.  Musculoskeletal:        General: No edema.     Cervical back: Normal range of motion.  Skin:    General: Skin is warm and dry.     Nails: There is no clubbing or cyanosis.  Neurological:     Mental Status: She is alert and oriented to person, place, and time.  Psychiatric:        Mood and Affect: Mood and affect normal.        Speech: Speech normal.        Behavior: Behavior normal.        Thought Content: Thought content normal.        Judgment: Judgment normal.     Wt Readings from Last 3 Encounters:  02/24/20 171 lb 2 oz (77.6 kg)  01/05/20 170 lb 4 oz (77.2 kg)  12/31/19 169 lb 3.2 oz (76.7 kg)     ASSESSMENT & PLAN:   1. CAD involving the native coronary arteries without angina: She is doing well without any symptom concerning for angina.  Recent Lexiscan MPI showed no evidence of ischemia and was low risk.  She remains on both aspirin and Eliquis as outlined below.  Continue metoprolol, though at a lower dose given bradycardic heart rates and fatigue as well as continuation of atorvastatin and ezetimibe.  No indication for further ischemic testing at this time.  2. PAF/pSVT: Maintaining sinus rhythm currently.  Regarding her A. fib, this was diagnosed in 02/2019 and she has been maintained on metoprolol and Eliquis with a CHA2DS2-VASc of at least 3.  No symptoms concerning for bleeding.  Check BMP and CBC.  Recent ZiO patch demonstrated 18 episodes of SVT with the longest episode lasting 10 beats.  Patient triggered events did not correlate with arrhythmia.  With sinus bradycardia noted in the office today and ongoing fatigue we will decrease her Lopressor to 12.5 mg twice daily.  SVT burden was not great enough to warrant initiation of AAT or referral to EP at this time.  3. Diastolic dysfunction/COPD/dyspnea: She appears euvolemic  and well compensated.  Not requiring standing diuretic.  Last PASP minimally elevated at 31.  Update echo.  She will be scheduling an appointment with her pulmonologist for routine follow-up.  4. LBBB: Echo earlier this year demonstrated preserved LV systolic function.  Lexiscan MPI in 12/2019 showed no evidence of ischemia.  No syncope.  Continue to monitor.  5. HLD: LDL 73 from 06/2019.  She remains on atorvastatin and Zetia.  6. Carotid artery disease: Status post left-sided CEA in 08/2014.  She remains on aspirin per vascular surgery.  Follow-up as directed.  Disposition: F/u with Dr. Rockey Situ or an APP in 3 months.   Medication Adjustments/Labs and Tests Ordered: Current medicines are reviewed at length with the patient today.  Concerns regarding medicines are outlined above. Medication changes, Labs and Tests ordered today are summarized above and listed in the Patient Instructions accessible in Encounters.   Signed, Christell Faith, PA-C 02/24/2020 11:19 AM     [address], [place] [address] [phone]

## 2020-02-24 ENCOUNTER — Other Ambulatory Visit: Payer: Self-pay

## 2020-02-24 ENCOUNTER — Encounter: Payer: Self-pay | Admitting: Physician Assistant

## 2020-02-24 ENCOUNTER — Ambulatory Visit (INDEPENDENT_AMBULATORY_CARE_PROVIDER_SITE_OTHER): Payer: Medicare Other | Admitting: Physician Assistant

## 2020-02-24 VITALS — BP 120/68 | HR 53 | Ht 71.0 in | Wt 171.1 lb

## 2020-02-24 DIAGNOSIS — I2510 Atherosclerotic heart disease of native coronary artery without angina pectoris: Secondary | ICD-10-CM | POA: Diagnosis not present

## 2020-02-24 DIAGNOSIS — I480 Paroxysmal atrial fibrillation: Secondary | ICD-10-CM | POA: Diagnosis not present

## 2020-02-24 DIAGNOSIS — I471 Supraventricular tachycardia: Secondary | ICD-10-CM

## 2020-02-24 DIAGNOSIS — I6523 Occlusion and stenosis of bilateral carotid arteries: Secondary | ICD-10-CM

## 2020-02-24 DIAGNOSIS — E785 Hyperlipidemia, unspecified: Secondary | ICD-10-CM

## 2020-02-24 DIAGNOSIS — R0600 Dyspnea, unspecified: Secondary | ICD-10-CM | POA: Diagnosis not present

## 2020-02-24 DIAGNOSIS — I447 Left bundle-branch block, unspecified: Secondary | ICD-10-CM | POA: Diagnosis not present

## 2020-02-24 DIAGNOSIS — I5189 Other ill-defined heart diseases: Secondary | ICD-10-CM | POA: Diagnosis not present

## 2020-02-24 MED ORDER — METOPROLOL TARTRATE 25 MG PO TABS: 12.5000 mg | ORAL_TABLET | Freq: Two times a day (BID) | ORAL | 3 refills | Status: DC

## 2020-02-24 NOTE — Patient Instructions (Signed)
Medication Instructions:  Your physician has recommended you make the following change in your medication:  1. DECREASE Metoprolol 25 mg to 1/2 tablet (12.5 mg) twice a day  *If you need a refill on your cardiac medications before your next appointment, please call your pharmacy*   Lab Work: BMET and CBC  If you have labs (blood work) drawn today and your tests are completely normal, you will receive your results only by: [name] MyChart Message (if you have MyChart) OR . A paper copy in the mail If you have any lab test that is abnormal or we need to change your treatment, we will call you to review the results.   Testing/Procedures: Your physician has requested that you have an echocardiogram. Echocardiography is a painless test that uses sound waves to create images of your heart. It provides your doctor with information about the size and shape of your heart and how well your heart's chambers and valves are working. This procedure takes approximately one hour. There are no restrictions for this procedure.     Follow-Up: At [facility], you and your health needs are our priority.  As part of our continuing mission to provide you with exceptional heart care, we have created designated Provider Care Teams.  These Care Teams include your primary Cardiologist (physician) and Advanced Practice Providers (APPs -  Physician Assistants and Nurse Practitioners) who all work together to provide you with the care you need, when you need it.  We recommend signing up for the patient portal called "MyChart".  Sign up information is provided on this After Visit Summary.  MyChart is used to connect with patients for Virtual Visits (Telemedicine).  Patients are able to view lab/test results, encounter notes, upcoming appointments, etc.  Non-urgent messages can be sent to your provider as well.   To learn more about what you can do with MyChart, go to [email or web address].    Your next appointment:   3  month(s)  The format for your next appointment:   In Person  Provider:   You may see Julien Nordmann, MD or one of the following Advanced Practice Providers on your designated Care Team:    Nicolasa Ducking, NP  Eula Listen, PA-C  Marisue Ivan, PA-C  Cad[place], [place]  Gillian Shields, NP

## 2020-02-25 LAB — BASIC METABOLIC PANEL
BUN/Creatinine Ratio: 16 (ref 12–28)
BUN: 14 mg/dL (ref 8–27)
CO2: 20 mmol/L (ref 20–29)
Calcium: 9.4 mg/dL (ref 8.7–10.3)
Chloride: 110 mmol/L — ABNORMAL HIGH (ref 96–106)
Creatinine, Ser: 0.87 mg/dL (ref 0.57–1.00)
GFR calc Af Amer: 76 mL/min/{1.73_m2} (ref >59)
GFR calc non Af Amer: 66 mL/min/{1.73_m2} (ref >59)
Glucose: 96 mg/dL (ref 65–99)
Potassium: 4.4 mmol/L (ref 3.5–5.2)
Sodium: 143 mmol/L (ref 134–144)

## 2020-02-25 LAB — CBC
Hematocrit: 39 % (ref 34.0–46.6)
Hemoglobin: 13.4 g/dL (ref 11.1–15.9)
MCH: 29.5 pg (ref 26.6–33.0)
MCHC: 34.4 g/dL (ref 31.5–35.7)
MCV: 86 fL (ref 79–97)
Platelets: 236 10*3/uL (ref 150–450)
RBC: 4.55 x10E6/uL (ref 3.77–5.28)
RDW: 13.1 % (ref 11.7–15.4)
WBC: 8.4 10*3/uL (ref 3.4–10.8)

## 2020-03-09 ENCOUNTER — Ambulatory Visit (INDEPENDENT_AMBULATORY_CARE_PROVIDER_SITE_OTHER): Payer: Medicare Other

## 2020-03-09 ENCOUNTER — Other Ambulatory Visit: Payer: Self-pay

## 2020-03-09 DIAGNOSIS — I480 Paroxysmal atrial fibrillation: Secondary | ICD-10-CM | POA: Diagnosis not present

## 2020-03-09 DIAGNOSIS — R0600 Dyspnea, unspecified: Secondary | ICD-10-CM

## 2020-03-09 LAB — ECHOCARDIOGRAM COMPLETE
AR max vel: 2.28 cm2
AV Area VTI: 2.51 cm2
AV Area mean vel: 2.18 cm2
AV Mean grad: 3 mmHg
AV Peak grad: 5.9 mmHg
Ao pk vel: 1.21 m/s
Area-P 1/2: 3.46 cm2
S' Lateral: 2.8 cm
Single Plane A4C EF: 54 %

## 2020-03-22 ENCOUNTER — Other Ambulatory Visit: Payer: Self-pay | Admitting: Cardiovascular Disease

## 2020-03-22 NOTE — Telephone Encounter (Signed)
Rx request sent to pharmacy.  

## 2020-03-24 ENCOUNTER — Telehealth: Payer: Self-pay

## 2020-03-24 NOTE — Telephone Encounter (Signed)
Patient is aware date/time of covid test prior to PFT.

## 2020-03-27 ENCOUNTER — Other Ambulatory Visit: Payer: Self-pay

## 2020-03-27 ENCOUNTER — Other Ambulatory Visit
Admission: RE | Admit: 2020-03-27 | Discharge: 2020-03-27 | Disposition: A | Discharge status: Home / Self Care | Payer: Medicare Other | Source: Ambulatory Visit | Attending: Pulmonary Disease | Admitting: Pulmonary Disease

## 2020-03-27 DIAGNOSIS — Z01812 Encounter for preprocedural laboratory examination: Secondary | ICD-10-CM | POA: Diagnosis not present

## 2020-03-27 DIAGNOSIS — Z20822 Contact with and (suspected) exposure to covid-19: Secondary | ICD-10-CM | POA: Diagnosis not present

## 2020-03-28 ENCOUNTER — Ambulatory Visit: Payer: Medicare Other | Attending: Pulmonary Disease

## 2020-03-28 DIAGNOSIS — R0602 Shortness of breath: Secondary | ICD-10-CM | POA: Diagnosis not present

## 2020-03-28 DIAGNOSIS — Z87891 Personal history of nicotine dependence: Secondary | ICD-10-CM | POA: Insufficient documentation

## 2020-03-28 LAB — SARS CORONAVIRUS 2 (TAT 6-24 HRS): SARS Coronavirus 2: NEGATIVE

## 2020-03-28 MED ORDER — ALBUTEROL SULFATE (2.5 MG/3ML) 0.083% IN NEBU
2.5000 mg | INHALATION_SOLUTION | Freq: Once | RESPIRATORY_TRACT | Status: AC
  Administered 2020-03-28: 2.5 mg via RESPIRATORY_TRACT
  Filled 2020-03-28: qty 3

## 2020-03-30 ENCOUNTER — Other Ambulatory Visit: Payer: Self-pay

## 2020-03-30 ENCOUNTER — Ambulatory Visit (INDEPENDENT_AMBULATORY_CARE_PROVIDER_SITE_OTHER): Payer: Medicare Other | Admitting: Pulmonary Disease

## 2020-03-30 ENCOUNTER — Encounter: Payer: Self-pay | Admitting: Pulmonary Disease

## 2020-03-30 VITALS — BP 118/64 | HR 56 | Temp 97.3°F | Ht 71.0 in | Wt 173.6 lb

## 2020-03-30 DIAGNOSIS — R490 Dysphonia: Secondary | ICD-10-CM

## 2020-03-30 DIAGNOSIS — J984 Other disorders of lung: Secondary | ICD-10-CM | POA: Diagnosis not present

## 2020-03-30 DIAGNOSIS — J449 Chronic obstructive pulmonary disease, unspecified: Secondary | ICD-10-CM

## 2020-03-30 DIAGNOSIS — R0602 Shortness of breath: Secondary | ICD-10-CM | POA: Diagnosis not present

## 2020-03-30 DIAGNOSIS — J439 Emphysema, unspecified: Secondary | ICD-10-CM | POA: Diagnosis not present

## 2020-03-30 MED ORDER — BREZTRI AEROSPHERE 160-9-4.8 MCG/ACT IN AERO: 2.0000 | INHALATION_SPRAY | Freq: Two times a day (BID) | RESPIRATORY_TRACT | 0 refills | Status: AC

## 2020-03-30 MED ORDER — ALBUTEROL SULFATE HFA 108 (90 BASE) MCG/ACT IN AERS: 2.0000 | INHALATION_SPRAY | Freq: Four times a day (QID) | RESPIRATORY_TRACT | 2 refills | Status: DC | PRN

## 2020-03-30 NOTE — Patient Instructions (Signed)
We are giving you a trial of Breztri 2 puffs twice a day.  Make sure you rinse your mouth well after you use it.  You can put a little baking soda in the water to rinse with.  Let us know how you are doing with the Judithann Sauger so we can call the prescription in for you.  We are giving you an albuterol inhaler that you can use as needed if you get short of breath in between your doses of Breztri.  Do not take Trelegy while taking the above.  Let us know if your hoarseness does not get better after change of inhaler so we can make the appropriate referral for you.  We will see you in follow-up in 2 months time call sooner should any new problems arise.

## 2020-03-30 NOTE — Progress Notes (Signed)
Subjective:    Patient ID: Kristie Walters, female    DOB: 11/06/1945, 75 y.o.   MRN: [id number]  Requesting MD/Service: Steele Sizer, MD Date of initial consultation: 02 December 2019 Reason for consultation: Emphysema/shortness of breath   PT PROFILE: 75 year old former smoker (quit 2018) with a 55-pack-year history of smoking presents for evaluation of shortness of breath and emphysema.   DATA: 03/26/2019 2D echo: G2 DD, mild elevation of pulmonary artery systolic pressure, LVEF 55 to 60% 09/16/2019 low-dose lung cancer screening CT: Multiple small inflammatory groundglass nodules, mild diffuse bronchial wall thickening with mild centrilobular and paraseptal emphysema, no evidence of fibrosis 03/09/2020 2D echo: LVEF 55 to 60%, grade I DD, mildly elevated right ventricular systolic pressure at 95.0 mmHg.  Pulmonary artery pressure was normal 03/28/2020 PFTs: FEV1 1.68 L or 59% predicted, FVC 2.06 L or 54% predicted, FEV1/FVC 82%, no bronchodilator response lung volumes moderately reduced.  There is a small airways component with bronchodilator response diffusion capacity moderately reduced.  Consistent with mixed restrictive and obstructive physiology  INTERVAL: Follow-up from initial visit here 02 December 2019.  Feels Trelegy helping but has developed hoarseness.  HPI Kristie Walters is a 75 year old former smoker (quit 2018, 55-pack-year smoking) who presents for follow-up of her initial visit here in October 2021.  She has been on Trelegy which helps her dyspnea significantly however, does note that she has developed hoarseness with the medication.  She has not had any increasing cough from baseline.  No chest pain.  No orthopnea or paroxysmal nocturnal dyspnea.  Dyspnea is class II-III.  No fevers, chills or sweats.  No hemoptysis.  Cough productive of yellowish sputum at times.   Review of Systems A 10 point review of systems was performed and it is as noted above otherwise  negative.  Patient Active Problem List   Diagnosis Date Noted  . 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 Fatty liver 09/29/2017  . Coronary artery disease 09/29/2017  . Centrilobular emphysema ([place]) 09/29/2017  . Basal cell carcinoma of nose 08/12/2017  . Estrogen deficiency 08/01/2017  . Aortic atherosclerosis ([place]) 09/14/2015  . Personal history of tobacco use, presenting hazards to health 08/31/2015  . Paresthesia of foot, bilateral 08/15/2015  . Breast mass, right 07/12/2015  . Elevated alkaline phosphatase level 05/29/2015  . COPD (chronic obstructive pulmonary disease) ([place]) 04/26/2015  . GERD (gastroesophageal reflux disease) 04/26/2015  . Hyperlipidemia 04/26/2015  . Hyperglycemia 04/26/2015  . Carotid stenosis 08/25/2014  . H/O malignant neoplasm of skin 08/13/2012   Allergies  Allergen Reactions  . Morphine Nausea Only and Nausea And Vomiting  . Morphine And Related Nausea And Vomiting   Current Meds  Medication Sig  . aspirin 81 MG tablet Take 81 mg by mouth daily.  [name] atorvastatin (LIPITOR) 20 MG tablet Take 1 tablet (20 mg total) by mouth daily.  . calcium carbonate (OS-CAL - DOSED IN MG OF ELEMENTAL CALCIUM) 1250 (500 Ca) MG tablet Take 1 tablet by mouth.  . cholecalciferol (VITAMIN D) 1000 units tablet Take 1,000 Units by mouth daily.  [name] ELIQUIS 5 MG TABS tablet TAKE 1 TABLET BY MOUTH TWICE A DAY  . ezetimibe (ZETIA) 10 MG tablet Take 1 tablet (10 mg total) by mouth daily.  [name] Fish Oil-Cholecalciferol (FISH OIL + D3 PO) Take 1,000 mg by mouth 1 day or 1 dose.  . fluticasone (FLONASE) 50 MCG/ACT nasal spray Place 2 sprays into both nostrils daily.  . metoprolol tartrate (LOPRESSOR) 25 MG tablet Take 0.5 tablets (  12.5 mg total) by mouth 2 (two) times daily.  [name] omeprazole (PRILOSEC) 40 MG capsule Take 1 capsule (40 mg total) by mouth as needed.  . pregabalin (LYRICA) 50 MG capsule Take 1 capsule (50 mg total) by mouth 3 (three) times daily.  . TRELEGY ELLIPTA  100-62.5-25 MCG/INH AEPB Take 1 puff by mouth daily.   Immunization History  Administered Date(s) Administered  . Influenza, High Dose Seasonal PF 10/08/2016, 10/01/2018, 10/08/2019  . Influenza-Unspecified 11/19/2014, 11/16/2015, 10/08/2016, 09/18/2017  . Moderna SARS-COV2 Booster Vaccination 12/31/2019  . Moderna Sars-Covid-2 Vaccination 01/05/2020  . PFIZER(Purple Top)SARS-COV-2 Vaccination 04/21/2019, 05/12/2019  . Pneumococcal Conjugate-13 11/18/2016  . Pneumococcal Polysaccharide-23 06/11/2012  . Td 02/18/2009       Objective:   Physical Exam BP 118/64 (BP Location: Left Arm, Cuff Size: Normal)   Pulse (!) 56   Temp (!) 97.3 F (36.3 C) (Temporal)   Ht 5\' 11"  (1.803 m)   Wt 173 lb 9.6 oz (78.7 kg)   SpO2 96%   BMI 24.21 kg/m  GENERAL: Well-developed well-nourished woman in no acute distress.  She ambulates with assistance of a cane. HEAD: Normocephalic, atraumatic.  EYES: Pupils equal, round, reactive to light.  No scleral icterus.  MOUTH: Nose/mouth/throat not examined due to masking requirements for COVID 19. NECK: Supple. No thyromegaly. Trachea midline. No JVD.  No adenopathy. PULMONARY: Good air entry bilaterally.  Coarse breath sounds otherwise no adventitious sounds. CARDIOVASCULAR: S1 and S2. Regular rate and rhythm.  No rubs, murmurs or gallops heard. ABDOMEN: Benign. MUSCULOSKELETAL: No joint deformity, no clubbing, no edema.  NEUROLOGIC: No focal deficit noted, speech is fluent.  Gait steady with assistance (cane). SKIN: Intact,warm,dry. PSYCH: Jovial mood, normal behavior.  Ambulatory oximetry performed today: No desaturations, remained consistent at 95% on room air.     Assessment & Plan:     ICD-10-CM   1. COPD with chronic bronchitis and emphysema ([place])  J44.9 Pulse oximetry, overnight   Has developed hoarseness with Trelegy Trial of Breztri 2 puffs twice a day  2. Mixed restrictive and obstructive lung disease ([place])  J43.9    J98.4    Continue  inhaler therapy for COPD component Restrictive physiology may be due to body habitus No evidence of pulmonary fibrosis on recent CT chest  3. Shortness of breath  R06.02    Ambulatory oximetry normal May be deconditioning/cardiac (DD) Element of COPD, optimize as above  4. Hoarseness, persistent  R49.0    Trial of Breztri as above Careful rinsing post use of inhalers ENT referral if does not clear    Orders Placed This Encounter  Procedures  . Pulse oximetry, overnight    On roomair. DME:new start    Standing Status:   Future    Standing Expiration Date:   03/30/2021   Meds ordered this encounter  Medications  . Budeson-Glycopyrrol-Formoterol (BREZTRI AEROSPHERE) 160-9-4.8 MCG/ACT AERO    Sig: Inhale 2 puffs into the lungs in the morning and at bedtime for 1 day.    Dispense:  10.7 g    Refill:  0    Order Specific Question:   Lot Number?    Answer:   [id number]    Order Specific Question:   Expiration Date?    Answer:   09/18/2021    Order Specific Question:   Manufacturer?    Answer:   Astra[employer]eneca [[id number]]    Order Specific Question:   Quantity    Answer:   2  . albuterol (VENTOLIN HFA) 108 (90 Base)  MCG/ACT inhaler    Sig: Inhale 2 puffs into the lungs every 6 (six) hours as needed for wheezing or shortness of breath.    Dispense:  8 g    Refill:  2   We will see the patient in 2 months time she is to contact us prior to that time should any new difficulties arise.  Renold Don, MD [facility]   *This note was dictated using voice recognition software/Dragon.  Despite best efforts to proofread, errors can occur which can change the meaning.  Any change was purely unintentional.

## 2020-03-31 LAB — PULMONARY FUNCTION TEST ARMC ONLY
DL/VA % pred: 92 %
DL/VA: 3.63 ml/min/mmHg/L
DLCO unc % pred: 49 %
DLCO unc: 11.94 ml/min/mmHg
FEF 25-75 Post: 2.41 L/sec
FEF 25-75 Pre: 1.79 L/sec
FEF2575-%Change-Post: 34 %
FEF2575-%Pred-Post: 112 %
FEF2575-%Pred-Pre: 84 %
FEV1-%Change-Post: 7 %
FEV1-%Pred-Post: 63 %
FEV1-%Pred-Pre: 59 %
FEV1-Post: 1.8 L
FEV1-Pre: 1.68 L
FEV1FVC-%Change-Post: 0 %
FEV1FVC-%Pred-Pre: 108 %
FEV6-%Change-Post: 7 %
FEV6-%Pred-Post: 61 %
FEV6-%Pred-Pre: 57 %
FEV6-Post: 2.2 L
FEV6-Pre: 2.06 L
FEV6FVC-%Change-Post: 0 %
FEV6FVC-%Pred-Post: 104 %
FEV6FVC-%Pred-Pre: 104 %
FVC-%Change-Post: 7 %
FVC-%Pred-Post: 58 %
FVC-%Pred-Pre: 54 %
FVC-Post: 2.2 L
FVC-Pre: 2.06 L
Post FEV1/FVC ratio: 82 %
Post FEV6/FVC ratio: 100 %
Pre FEV1/FVC ratio: 82 %
Pre FEV6/FVC Ratio: 100 %
RV % pred: 70 %
RV: 1.84 L
TLC % pred: 68 %
TLC: 4.15 L

## 2020-04-07 DIAGNOSIS — J449 Chronic obstructive pulmonary disease, unspecified: Secondary | ICD-10-CM | POA: Diagnosis not present

## 2020-04-12 ENCOUNTER — Telehealth: Payer: Self-pay | Admitting: Pulmonary Disease

## 2020-04-12 NOTE — Telephone Encounter (Signed)
ONO reviewed by Dr. Verda Cumins need for nocturnal oxygen.  Patient is aware of results and voiced her understanding.   Patient stated that since switching from Trelegy to [facility] her voice hoariness has improved but her breathing has worsened. 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   Dr. Patsey Berthold, please advise. Thanks

## 2020-04-12 NOTE — Telephone Encounter (Signed)
I would recommend going back on Trelegy but rinsing with a little baking soda after using it to see if that helps.

## 2020-04-12 NOTE — Telephone Encounter (Signed)
Patient is aware of below recommendations.  She voiced her understanding and had no further questions.  Nothing further needed.  

## 2020-04-24 ENCOUNTER — Other Ambulatory Visit: Payer: Medicare Other

## 2020-05-25 NOTE — Progress Notes (Signed)
Cardiology Office Note  Date:  05/26/2020   ID:  Kristie Walters, DOB 20-Jun-1945, MRN [id number]  PCP:  Kristie Sizer, MD   Chief Complaint  Patient presents with  . 3 month follow up     Patient c/o shortness of breath. Medications reviewed by the patient verbally.     HPI:  [name] is a 75 year old woman with past medical history of Atrial fibrillation January 2021 COPD Ex-smoker with 56 pack-year history. Quit last year 11/26/2017 Moh's surgery on the nose; healing up; went to [facility], Carotid stenosis, 1-39% stenosis b/l, CEA on the left Who presents for f/u of her  coronary calcium/CAD, atrial fib  Last seen in clinic By myselfFebruary 2021 Seen by one of our providers February 24, 2020 Prior fall  Followed by pulmonary for COPD Inhalers managed by Dr. Patsey Berthold  Brother lives up the street "can't do a whole lot",  Lives in mobile home , alone Daughter out of state  Echocardiogram January 2022 normal LV function High normal right heart pressures  Stress test November 2021 Low risk study, no significant ischemia Chronic arthritic pain, neck posterior shoulders  EKG personally reviewed by myself on todays visit NSR rate 75 bpm LBBB  Other past medical history reviewed Seen in the emergency room March 06, 2019 Diagnosis of atrial fibrillation  given metop 2.5 IV x 1. She cardioverted  New LBBB compared to 2019  CT scan Mild Aortic and branch vessel atherosclerosis. Normal heart size, without pericardial effusion.  mild Lad disease proximal region   PMH:   has a past medical history of Allergy, Atrial fibrillation ([place]), Basal cell carcinoma of nose (08/12/2017), Carotid artery disease ([place]), Centrilobular emphysema ([place]) (09/29/2017), Collagen vascular disease ([place]), COPD (chronic obstructive pulmonary disease) ([place]), Coronary artery disease (09/29/2017), Elevated blood pressure (not hypertension), Fatty liver (09/29/2017), GERD (gastroesophageal reflux  disease), Hyperlipidemia, Personal history of tobacco use, presenting hazards to health (08/31/2015), and PONV (postoperative nausea and vomiting).  PSH:    Past Surgical History:  Procedure Laterality Date  . BREAST BIOPSY    . ENDARTERECTOMY Left 08/25/2014   Procedure: ENDARTERECTOMY CAROTID;  Surgeon: Algernon Huxley, MD;  Location: [facility] ORS;  Service: Vascular;  Laterality: Left;  . MOHS SURGERY    . MOHS SURGERY  09/23/2017   nose  . PERIPHERAL VASCULAR CATHETERIZATION N/A 07/20/2014   Procedure: Carotid Angiography;  Surgeon: Algernon Huxley, MD;  Location: [facility];  Service: Cardiovascular;  Laterality: N/A;  . TONSILLECTOMY    . TUBAL LIGATION      Current Outpatient Medications on File Prior to Visit  Medication Sig Dispense Refill  . albuterol (VENTOLIN HFA) 108 (90 Base) MCG/ACT inhaler Inhale 2 puffs into the lungs every 6 (six) hours as needed for wheezing or shortness of breath. 8 g 2  . aspirin 81 MG tablet Take 81 mg by mouth daily.    [name] atorvastatin (LIPITOR) 20 MG tablet Take 1 tablet (20 mg total) by mouth daily. 90 tablet 1  . calcium carbonate (OS-CAL - DOSED IN MG OF ELEMENTAL CALCIUM) 1250 (500 Ca) MG tablet Take 1 tablet by mouth.    . cholecalciferol (VITAMIN D) 1000 units tablet Take 1,000 Units by mouth daily.    [name] ELIQUIS 5 MG TABS tablet TAKE 1 TABLET BY MOUTH TWICE A DAY 180 tablet 1  . ezetimibe (ZETIA) 10 MG tablet Take 1 tablet (10 mg total) by mouth daily. 90 tablet 3  . Fish  Oil-Cholecalciferol (FISH OIL + D3 PO) Take 1,000 mg by mouth 1 day or 1 dose.    . fluticasone (FLONASE) 50 MCG/ACT nasal spray Place 2 sprays into both nostrils daily. 16 g 6  . metoprolol tartrate (LOPRESSOR) 25 MG tablet Take 0.5 tablets (12.5 mg total) by mouth 2 (two) times daily. 90 tablet 3  . omeprazole (PRILOSEC) 40 MG capsule Take 1 capsule (40 mg total) by mouth as needed. 90 capsule 1  . pregabalin (LYRICA) 50 MG capsule Take 1 capsule (50 mg total) by mouth 3 (three)  times daily. 270 capsule 1  . TRELEGY ELLIPTA 100-62.5-25 MCG/INH AEPB Take 1 puff by mouth daily.     No current facility-administered medications on file prior to visit.    Allergies:   Morphine and Morphine and related   Social History:  The patient  reports that she quit smoking about 3 years ago. Her smoking use included cigarettes. She has a 55.00 pack-year smoking history. Her smokeless tobacco use includes snuff. She reports that she does not drink alcohol and does not use drugs.   Family History:   family history includes Alzheimer's disease in her brother; CVA in her father; Dementia in her mother; Diabetes in her brother and brother; HIV in her son; Heart attack in her mother; Hypercholesterolemia in her mother; Hypertension in her mother; Hypothyroidism in her mother; Kidney cancer in her sister; Liver cancer in her father; Lymphoma in her son; Other in her brother; Peripheral vascular disease in her mother.    Review of Systems: Review of Systems  Constitutional: Negative.   Respiratory: Positive for shortness of breath.   Cardiovascular: Negative.   Gastrointestinal: Negative.   Musculoskeletal: Positive for back pain.  Neurological: Negative.   Psychiatric/Behavioral: Negative.   All other systems reviewed and are negative.   PHYSICAL EXAM: VS:  BP 110/70 (BP Location: Left Arm, Patient Position: Sitting, Cuff Size: Normal)   Pulse 75   Ht 5\' 11"  (1.803 m)   Wt 172 lb 4 oz (78.1 kg)   SpO2 98%   BMI 24.02 kg/m  , BMI Body mass index is 24.02 kg/m. GEN: Well nourished, well developed, in no acute distress  HEENT: normal  Neck: no JVD, carotid bruits, or masses Cardiac: RRR; no murmurs, rubs, or gallops,no edema  Respiratory:  clear to auscultation bilaterally, normal work of breathing GI: soft, nontender, nondistended, + BS MS: no deformity or atrophy  Skin: warm and dry, no rash Neuro:  Strength and sensation are intact Psych: euthymic mood, full  affect  Recent Labs: 07/12/2019: ALT 27 09/30/2019: TSH 3.99 02/24/2020: BUN 14; Creatinine, Ser 0.87; Hemoglobin 13.4; Platelets 236; Potassium 4.4; Sodium 143    Lipid Panel Lab Results  Component Value Date   CHOL 137 07/12/2019   HDL 38 (L) 07/12/2019   LDLCALC 73 07/12/2019   TRIG 188 (H) 07/12/2019      Wt Readings from Last 3 Encounters:  05/26/20 172 lb 4 oz (78.1 kg)  03/30/20 173 lb 9.6 oz (78.7 kg)  02/24/20 171 lb 2 oz (77.6 kg)      ASSESSMENT AND PLAN:  Aortic atherosclerosis ([place]) - Plan: EKG 12-Lead Atorvastatin and zetia Good numbers  Coronary artery disease involving native coronary artery of native heart with angina pectoris ([place]) - Plan: EKG 12-Lead Mild focal disease proximal LAD region stress test echocardiogram reviewed with her 2017 Echo normal Ef 55% New LBBB, no anginal sx  Mixed hyperlipidemia  low-dose Lipitor 10 mg daily with  Zetia  Bilateral carotid artery stenosis History of carotid endarterectomy on the left b/l ICA are consistent with a 1-39%   COPD Long history of smoking since age 50 Stable, periodic SOB Recommended regular walking program for conditioning  Leg weakness/fall risk Prior history of falls, unable to walk very far secondary to leg weakness Walks around her mobile home, Information provided on med alert if she falls and does not have her phone with her to call her daughter who lives out of state Brother down the street does not check up on her on a regular basis    Total encounter time more than 25 minutes  Greater than 50% was spent in counseling and coordination of care with the patient    No orders of the defined types were placed in this encounter.    Signed, Esmond Plants, M.D., Ph.D. 05/26/2020  [place], [place]

## 2020-05-26 ENCOUNTER — Encounter: Payer: Self-pay | Admitting: Cardiovascular Disease

## 2020-05-26 ENCOUNTER — Other Ambulatory Visit: Payer: Self-pay

## 2020-05-26 ENCOUNTER — Ambulatory Visit (INDEPENDENT_AMBULATORY_CARE_PROVIDER_SITE_OTHER): Payer: Medicare Other | Admitting: Cardiovascular Disease

## 2020-05-26 VITALS — BP 110/70 | HR 75 | Ht 71.0 in | Wt 172.2 lb

## 2020-05-26 DIAGNOSIS — I480 Paroxysmal atrial fibrillation: Secondary | ICD-10-CM | POA: Diagnosis not present

## 2020-05-26 DIAGNOSIS — I471 Supraventricular tachycardia: Secondary | ICD-10-CM

## 2020-05-26 DIAGNOSIS — I5189 Other ill-defined heart diseases: Secondary | ICD-10-CM | POA: Diagnosis not present

## 2020-05-26 DIAGNOSIS — I2510 Atherosclerotic heart disease of native coronary artery without angina pectoris: Secondary | ICD-10-CM

## 2020-05-26 DIAGNOSIS — E785 Hyperlipidemia, unspecified: Secondary | ICD-10-CM

## 2020-05-26 DIAGNOSIS — I6523 Occlusion and stenosis of bilateral carotid arteries: Secondary | ICD-10-CM

## 2020-05-26 NOTE — Patient Instructions (Addendum)
Information on Med alert button for falls  LifeFone: Best value. [phone] Medical Care Alert: Best monitoring center. [phone] [facility]: Best for remote caregiving. [phone] QMedic: Best for high-risk patients. [phone] GetSafe: Best voice-activated system. [phone] [employer]: Fastest call response times.  [phone] [employer] Lifeline: Most trusted brand. [phone]  Medication Instructions:  No changes  Lab work: No new labs needed  Testing/Procedures: No new testing needed   Follow-Up:  . You will need a follow up appointment in 12 months  . Providers on your designated Care Team:   . Murray Hodgkins, NP . Christell Faith, PA-C . Marrianne Mood, PA-C  COVID-19 Vaccine Information can be found at: ShippingScam.co.uk For questions related to vaccine distribution or appointments, please email vaccine@[email or web address] .com or call 8042900537.

## 2020-06-01 ENCOUNTER — Ambulatory Visit (INDEPENDENT_AMBULATORY_CARE_PROVIDER_SITE_OTHER): Payer: Medicare Other | Admitting: Pulmonary Disease

## 2020-06-01 ENCOUNTER — Encounter: Payer: Self-pay | Admitting: Pulmonary Disease

## 2020-06-01 ENCOUNTER — Other Ambulatory Visit: Payer: Self-pay

## 2020-06-01 VITALS — BP 130/70 | HR 62 | Temp 97.1°F | Ht 66.02 in | Wt 172.4 lb

## 2020-06-01 DIAGNOSIS — J449 Chronic obstructive pulmonary disease, unspecified: Secondary | ICD-10-CM | POA: Diagnosis not present

## 2020-06-01 DIAGNOSIS — Z23 Encounter for immunization: Secondary | ICD-10-CM

## 2020-06-01 DIAGNOSIS — Z87891 Personal history of nicotine dependence: Secondary | ICD-10-CM

## 2020-06-01 DIAGNOSIS — R490 Dysphonia: Secondary | ICD-10-CM | POA: Diagnosis not present

## 2020-06-01 NOTE — Patient Instructions (Signed)
You received a Pneumovax 23 today.  Continue using Trelegy.   We will see you in follow-up in 4 months time call sooner should any new problems arise.

## 2020-06-01 NOTE — Progress Notes (Signed)
Subjective:    Patient ID: Kristie Walters, female    DOB: May 06, 1945, 75 y.o.   MRN: [id number]  Requesting MD/Service: Steele Sizer, MD Date of initial consultation: 02 December 2019 Reason for consultation: Emphysema/shortness of breath  PT PROFILE: 75 year old former smoker (quit 2018) with a 55-pack-year history of smoking presents for evaluation of shortness of breath and emphysema.  DATA: 03/26/2019 2D echo: G2 DD, mild elevation of pulmonary artery systolic pressure, LVEF 55 to 60% 09/16/2019 low-dose lung cancer screening [id number] small inflammatory groundglass nodules, mild diffuse bronchial wall thickening with mild centrilobular and paraseptal emphysema, no evidence of fibrosis  INTERVAL: Follow-up from 03/30/2020.  At that time was given a trial of Breztri, did not tolerate Breztri.  Went back to [employer].    HPI Kristie Walters follows up from her prior visit of 30 March 2020.  She went back to using Trelegy and now feels that this is helping.  She did not do well with Breztri.  Felt that Breztri made her too hoarse.  The previous issues that she was having with the Trelegy have resolved with proper rinsing of the mouth.  She does not endorse any fevers, chills or sweats and is doing well today.  She has not had any change in her dyspnea.  No cough or sputum production.  Overall is doing well and looks well.  She will need updating her Pneumovax 23 today.  Review of Systems A 10 point review of systems was performed and it is as noted above otherwise negative.  Patient Active Problem List   Diagnosis Date Noted  . 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 Fatty liver 09/29/2017  . Coronary artery disease 09/29/2017  . Centrilobular emphysema ([place]) 09/29/2017  . Basal cell carcinoma of nose 08/12/2017  . Estrogen deficiency 08/01/2017  . Aortic atherosclerosis ([place]) 09/14/2015  . Personal history of tobacco use, presenting hazards to health 08/31/2015  . Paresthesia  of foot, bilateral 08/15/2015  . Breast mass, right 07/12/2015  . Elevated alkaline phosphatase level 05/29/2015  . COPD (chronic obstructive pulmonary disease) ([place]) 04/26/2015  . GERD (gastroesophageal reflux disease) 04/26/2015  . Hyperlipidemia 04/26/2015  . Hyperglycemia 04/26/2015  . Carotid stenosis 08/25/2014  . H/O malignant neoplasm of skin 08/13/2012   Social History   Tobacco Use  . Smoking status: Former Smoker    Packs/day: 1.00    Years: 55.00    Pack years: 55.00    Types: Cigarettes    Quit date: 11/26/2016    Years since quitting: 3.5  . Smokeless tobacco: Current User    Types: Snuff  . Tobacco comment: smoking cessation materials not required  Substance Use Topics  . Alcohol use: No   Allergies  Allergen Reactions  . Morphine Nausea Only and Nausea And Vomiting  . Morphine And Related Nausea And Vomiting   Current Meds  Medication Sig  . albuterol (VENTOLIN HFA) 108 (90 Base) MCG/ACT inhaler Inhale 2 puffs into the lungs every 6 (six) hours as needed for wheezing or shortness of breath.  [name] aspirin 81 MG tablet Take 81 mg by mouth daily.  . calcium carbonate (OS-CAL - DOSED IN MG OF ELEMENTAL CALCIUM) 1250 (500 Ca) MG tablet Take 1 tablet by mouth.  . cholecalciferol (VITAMIN D) 1000 units tablet Take 1,000 Units by mouth daily.  [name] ELIQUIS 5 MG TABS tablet TAKE 1 TABLET BY MOUTH TWICE A DAY  . ezetimibe (ZETIA) 10 MG tablet Take 1 tablet (10 mg total) by mouth daily.  [name]  Fish Oil-Cholecalciferol (FISH OIL + D3 PO) Take 1,000 mg by mouth 1 day or 1 dose.  . fluticasone (FLONASE) 50 MCG/ACT nasal spray Place 2 sprays into both nostrils daily.  . metoprolol tartrate (LOPRESSOR) 25 MG tablet Take 0.5 tablets (12.5 mg total) by mouth 2 (two) times daily.  [name] omeprazole (PRILOSEC) 40 MG capsule Take 1 capsule (40 mg total) by mouth as needed.  . pregabalin (LYRICA) 50 MG capsule Take 1 capsule (50 mg total) by mouth 3 (three) times daily.  . TRELEGY ELLIPTA  100-62.5-25 MCG/INH AEPB Take 1 puff by mouth daily.   Immunization History  Administered Date(s) Administered  . Influenza, High Dose Seasonal PF 10/08/2016, 10/01/2018, 10/08/2019  . Influenza-Unspecified 11/19/2014, 11/16/2015, 10/08/2016, 09/18/2017  . Moderna Sars-Covid-2 Vaccination 01/05/2020  . PFIZER(Purple Top)SARS-COV-2 Vaccination 04/21/2019, 05/12/2019  . Pneumococcal Conjugate-13 11/18/2016  . Pneumococcal Polysaccharide-23 06/11/2012  . Td 02/18/2009       Objective:   Physical Exam BP 130/70 (BP Location: Left Arm, Patient Position: Sitting, Cuff Size: Normal)   Pulse 62   Temp (!) 97.1 F (36.2 C) (Temporal)   Ht 5' 6.02" (1.677 m)   Wt 172 lb 6.4 oz (78.2 kg)   SpO2 97%   BMI 27.81 kg/m  GENERAL: Well-developed well-nourished woman in no acute distress. She ambulates with assistance of a cane. HEAD: Normocephalic, atraumatic.  EYES: Pupils equal, round, reactive to light. No scleral icterus.  MOUTH: Nose/mouth/throat not examined due to masking requirements for COVID 19. NECK: Supple. No thyromegaly. Trachea midline. No JVD. No adenopathy. PULMONARY: Good air entry bilaterally. Coarse breath sounds otherwise no adventitious sounds. CARDIOVASCULAR: S1 and S2. Regular rate and rhythm. No rubs, murmurs or gallops heard. ABDOMEN: Benign. MUSCULOSKELETAL: No joint deformity, no clubbing, no edema.  NEUROLOGIC: No focal deficit noted, speech is fluent. Gait steady with assistance (cane). SKIN: Intact,warm,dry. PSYCH: Jovial mood, normal behavior.       Assessment & Plan:     ICD-10-CM   1. COPD with chronic bronchitis and emphysema ([place])  J44.9    Doing well with current Trelegy therapy Continue Trelegy  2. Hoarseness, persistent  R49.0    Resolved with proper rinsing of mouth after use of Trelegy  3. Former heavy tobacco smoker  Z87.891    Continues to be abstinent of cigarettes  4. Need for prophylactic vaccination against Streptococcus  pneumoniae (pneumococcus)  Z23 Pneumococcal polysaccharide vaccine 23-valent greater than or equal to 2yo subcutaneous/IM   Received Pneumovax 23 today   Orders Placed This Encounter  Procedures  . Pneumococcal polysaccharide vaccine 23-valent greater than or equal to 2yo subcutaneous/IM   The patient is doing well.  We will see her in follow-up in 4 months time she is to contact us prior to that time should any new problems arise.  Renold Don, MD [facility]   *This note was dictated using voice recognition software/Dragon.  Despite best efforts to proofread, errors can occur which can change the meaning.  Any change was purely unintentional.

## 2020-06-02 ENCOUNTER — Encounter: Payer: Self-pay | Admitting: Pulmonary Disease

## 2020-06-05 ENCOUNTER — Encounter: Payer: Self-pay | Admitting: Pulmonary Disease

## 2020-06-20 ENCOUNTER — Other Ambulatory Visit: Payer: Self-pay | Admitting: Family Medicine

## 2020-06-20 ENCOUNTER — Other Ambulatory Visit: Payer: Self-pay | Admitting: Cardiovascular Disease

## 2020-06-20 DIAGNOSIS — K219 Gastro-esophageal reflux disease without esophagitis: Secondary | ICD-10-CM

## 2020-06-20 NOTE — Telephone Encounter (Signed)
Refill Request.  

## 2020-06-20 NOTE — Telephone Encounter (Signed)
60f, 78.2kg, scr 0.87 02/24/20, lovw/gollan 05/26/20

## 2020-06-29 NOTE — Progress Notes (Signed)
Name: Kristie Walters   MRN: [id number]    DOB: Aug 12, 1945   Date:06/30/2020       Progress Note  Subjective  Chief Complaint  Follow Up  HPI  Abdominal distention: she has noticed that her belly has been distended for months, she thinks it started around January 2022. She is constantly bloated, worse when she eats. Abdominal girth is high. She is only able to eat breakfast, sometimes eats a sandwich at lunch and popcorn at night. She states pain is burning at times, other times feels nauseated. No change in bowel movements. She is concerned because sister had kidney cancer and father had liver cancer  Paroxysmal Afib: went to [facility] on 03/06/2019 after onset of chest pain, palpitation and SOB, transported by EMS. She was given Metoprolol and cardioverted. Seen by Dr. Rockey Situ on 04/13/2019 and EKG during the visit showed normal sinus rhythm. She has occasional flutter and a little SOB that lasts a seconds. She is on Eliquis , no blood in stools.   Echo 02/2020  1. Left ventricular ejection fraction, by estimation, is 55 to 60%. The left ventricle has normal function. The left ventricle has no regional wall motion abnormalities. Left ventricular diastolic parameters are consistent with Grade I diastolic dysfunction (impaired relaxation). 2. Right ventricular systolic function is normal. The right ventricular size is normal. There is normal pulmonary artery systolic pressure. The estimated right ventricular systolic pressure is 37.1 mmHg. 3. The aortic valve was not well visualized.  CAD/Carotid Artery Stenosis and atherosclerosis aorta : coronary artery calcium incidentally found on CT but stress test no significant ischemia in 2017  She also has carotid atherosclerosis and sees Dr. Lucky Cowboy - history of left sided CEA. On statin and zetia. Reviewed last labs , last LDL was down to 73, HDL slightly better from 36 to 38 , we will recheck labs today.  No recent episodes of chest pain   Abnormal  mammogram on 02/2019, went back for breast biopsy of left and results negative, I placed referral but it was not scheduled it yet   Elevated TSH: last level back to normal, but with increase in fatigue we will check labs today   Thoracic spine compression fracture found on CT done 07//2020, she has chronic pain on thoracic spine, it radiates to both side, she is on Lyrica daily, but she has noticed increase in pain on right posterior upper back over this past week.   She has not contacted Endo yet, reminded her again. She states too busy   COPD/Chronic bronchitis/Emphysema:  she no longer smokes, she quit in 2018, she was a heavy smoker one pack daily from age 75 until age 43. She was on Spiriva, but is now seeing Dr. Duwayne Heck and is doing well on Trelegy, she has noticed that since abdominal distention is getting harder to breath again, but feels Trelegy is helpful   Cervical Radiculitis: she states still has paresthesia that goes in the ulnar aspect of right arm, she has pain with rom of her neck, on lyrica helps. She states not a constant problem, about a couple of times a week. She had PT but stopped on her own.   Peripheral neuropathy: she states she has constant tingling and pain on her feet but not severe until the night , Lyrica has helped with pain . She states she has a history of injury on both feet, she has a hot sensation that is intense on top of left foot. She states pain is  not as controlled, it is up to 6/10 now. Still has balance problems. Last B12 slightly low, she states she has not been taking supplementation, explained we will recheck levels but needs to stay on it for life time and it may be the cause of increase of symptoms   Dysthymia : son lives in [place]. He has HIV and also T-cell lymphoma, she worries about him,last time she came in we gave her duloxetine but she stopped because it made her feel groggy, she does not want medications, she states coping okay   Senile  purpura: on Eliquis gave her reassurance. Unchanged  GERD: she is getting worse, taking omeprazole but getting bloated    Patient Active Problem List   Diagnosis Date Noted  . 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 Fatty liver 09/29/2017  . Coronary artery disease 09/29/2017  . Centrilobular emphysema ([place]) 09/29/2017  . Basal cell carcinoma of nose 08/12/2017  . Estrogen deficiency 08/01/2017  . Aortic atherosclerosis ([place]) 09/14/2015  . Personal history of tobacco use, presenting hazards to health 08/31/2015  . Paresthesia of foot, bilateral 08/15/2015  . Breast mass, right 07/12/2015  . Elevated alkaline phosphatase level 05/29/2015  . COPD (chronic obstructive pulmonary disease) ([place]) 04/26/2015  . GERD (gastroesophageal reflux disease) 04/26/2015  . Hyperlipidemia 04/26/2015  . Hyperglycemia 04/26/2015  . Carotid stenosis 08/25/2014  . H/O malignant neoplasm of skin 08/13/2012    Past Surgical History:  Procedure Laterality Date  . BREAST BIOPSY    . ENDARTERECTOMY Left 08/25/2014   Procedure: ENDARTERECTOMY CAROTID;  Surgeon: Algernon Huxley, MD;  Location: [facility] ORS;  Service: Vascular;  Laterality: Left;  . MOHS SURGERY    . MOHS SURGERY  09/23/2017   nose  . PERIPHERAL VASCULAR CATHETERIZATION N/A 07/20/2014   Procedure: Carotid Angiography;  Surgeon: Algernon Huxley, MD;  Location: [place] [facility];  Service: Cardiovascular;  Laterality: N/A;  . TONSILLECTOMY    . TUBAL LIGATION      Family History  Problem Relation Age of Onset  . Heart attack Mother   . Hypercholesterolemia Mother   . Hypertension Mother   . Peripheral vascular disease Mother   . Dementia Mother   . Hypothyroidism Mother   . CVA Father   . Liver cancer Father   . Diabetes Brother   . Kidney cancer Sister   . Diabetes Brother   . Alzheimer's disease Brother   . Other Brother        alzheimers  . Lymphoma Son   . HIV Son   . Breast cancer Neg Hx     Social History   Tobacco Use  .  Smoking status: Former Smoker    Packs/day: 1.00    Years: 55.00    Pack years: 55.00    Types: Cigarettes    Quit date: 11/26/2016    Years since quitting: 3.5  . Smokeless tobacco: Current User    Types: Snuff  . Tobacco comment: smoking cessation materials not required  Substance Use Topics  . Alcohol use: No     Current Outpatient Medications:  .  albuterol (VENTOLIN HFA) 108 (90 Base) MCG/ACT inhaler, Inhale 2 puffs into the lungs every 6 (six) hours as needed for wheezing or shortness of breath., Disp: 8 g, Rfl: 2 .  aspirin 81 MG tablet, Take 81 mg by mouth daily., Disp: , Rfl:  .  calcium carbonate (OS-CAL - DOSED IN MG OF ELEMENTAL CALCIUM) 1250 (500 Ca) MG tablet, Take 1 tablet by mouth.,  Disp: , Rfl:  .  cholecalciferol (VITAMIN D) 1000 units tablet, Take 1,000 Units by mouth daily., Disp: , Rfl:  .  ELIQUIS 5 MG TABS tablet, TAKE 1 TABLET BY MOUTH TWICE A DAY, Disp: 180 tablet, Rfl: 1 .  ezetimibe (ZETIA) 10 MG tablet, Take 1 tablet (10 mg total) by mouth daily., Disp: 90 tablet, Rfl: 3 .  Fish Oil-Cholecalciferol (FISH OIL + D3 PO), Take 1,000 mg by mouth 1 day or 1 dose., Disp: , Rfl:  .  fluticasone (FLONASE) 50 MCG/ACT nasal spray, Place 2 sprays into both nostrils daily., Disp: 16 g, Rfl: 6 .  metoprolol tartrate (LOPRESSOR) 25 MG tablet, Take 0.5 tablets (12.5 mg total) by mouth 2 (two) times daily., Disp: 90 tablet, Rfl: 3 .  omeprazole (PRILOSEC) 40 MG capsule, TAKE 1 CAPSULE (40 MG TOTAL) BY MOUTH AS NEEDED., Disp: 90 capsule, Rfl: 1 .  pregabalin (LYRICA) 50 MG capsule, Take 1 capsule (50 mg total) by mouth 3 (three) times daily., Disp: 270 capsule, Rfl: 1 .  TRELEGY ELLIPTA 100-62.5-25 MCG/INH AEPB, Take 1 puff by mouth daily., Disp: , Rfl:  .  atorvastatin (LIPITOR) 20 MG tablet, Take 1 tablet (20 mg total) by mouth daily., Disp: 90 tablet, Rfl: 1  Allergies  Allergen Reactions  . Morphine Nausea Only and Nausea And Vomiting  . Morphine And Related Nausea And  Vomiting    I personally reviewed active problem list, medication list, allergies, family history, social history, health maintenance with the patient/caregiver today.   ROS  Constitutional: Negative for fever , positive for weight change.  Respiratory: Negative for cough , positive for  shortness of breath.   Cardiovascular: Negative for chest pain, positive for occasional  palpitations.  Gastrointestinal: positive  for abdominal pain, but no  bowel changes.  Musculoskeletal: positive  for gait problem but no  joint swelling.  Skin: positive  for rash.  she has rosacea and will contact dermatologist  Neurological: Negative for dizziness or headache.  No other specific complaints in a complete review of systems (except as listed in HPI above).  Objective  Vitals:   06/30/20 0951  BP: 132/74  Pulse: 89  Resp: 18  Temp: 97.8 F (36.6 C)  TempSrc: Oral  SpO2: 96%  Weight: 176 lb (79.8 kg)  Height: 5\' 7"  (1.702 m)    Body mass index is 27.57 kg/m.  Physical Exam  Constitutional: Patient appears well-developed and well-nourished.  No distress.  HEENT: head atraumatic, normocephalic, pupils equal and reactive to lightneck supple, throat within normal limits Cardiovascular: Normal rate, regular rhythm and normal heart sounds.  No murmur heard. No BLE edema. Pulmonary/Chest: Effort normal and breath sounds normal. No respiratory distress. Abdominal: Soft.  There is mild  Tenderness, distended abdominal, no ascitis . Psychiatric: Patient has a normal mood and affect. behavior is normal. Judgment and thought content normal.   PHQ2/9: Depression screen [facility] 2/9 06/30/2020 12/31/2019 12/10/2019 11/03/2019 09/30/2019  Decreased Interest 0 1 0 0 0  Down, Depressed, Hopeless 0 0 0 1 1  PHQ - 2 Score 0 1 0 1 1  Altered sleeping - 1 - - 2  Tired, decreased energy - 1 - - 0  Change in appetite - 0 - - 0  Feeling bad or failure about yourself  - 0 - - 0  Trouble concentrating - 0 - - 0   Moving slowly or fidgety/restless - 0 - - 0  Suicidal thoughts - 0 - - 0  PHQ-9  Score - 3 - - 3  Difficult doing work/chores - Not difficult at all - - -  Some recent data might be hidden    phq 9 is positive   Fall Risk: Fall Risk  06/30/2020 12/31/2019 12/10/2019 09/30/2019 09/15/2019  Falls in the past year? 0 0 0 0 0  Comment - - - - -  Number falls in past yr: 0 0 0 0 0  Injury with Fall? 0 0 0 0 0  Risk for fall due to : - - - - -  Risk for fall due to: Comment - - - - -  Follow up - - - - -     Assessment & Plan  1. Aortic atherosclerosis (HCC)  - Lipid panel  2. Abdominal distention  - US Abdomen Complete; Future - Ambulatory referral to Gastroenterology - Lipase  3. Increased abdominal girth  - US Abdomen Complete; Future - Ambulatory referral to Gastroenterology - Lipase  4. Nausea  - US Abdomen Complete; Future - Ambulatory referral to Gastroenterology - COMPLETE METABOLIC PANEL WITH GFR - Lipase  5. Generalized abdominal pain  - US Abdomen Complete; Future - Ambulatory referral to Gastroenterology - CBC with Differential/Platelet  6. COPD with chronic bronchitis and emphysema ([place])   7. Neuropathy  Needs to resume B12  8. Age-related osteoporosis without current pathological fracture  Reminded her to contact Endo  9. Coronary artery disease involving native coronary artery of native heart with angina pectoris ([place])  Under the care of cardiologist   10. Paroxysmal atrial fibrillation (HCC)   11. Pulmonary hypertension ([place])  Last echo was normal   12. Senile purpura (HCC)  Stable, reassurance given   13. Gastroesophageal reflux disease without esophagitis   14. Abnormal TSH  - TSH  15. Chronic obstructive pulmonary disease, unspecified COPD type ([place])   16. Centrilobular emphysema ([place])   17. Gait difficulty  Uses cane, likely from neuropathy   18. Vitamin D deficiency  - VITAMIN D 25 Hydroxy (Vit-D Deficiency,  Fractures)  19. B12 deficiency  - Vitamin B12  20.Breast cancer screening by mammogram  - MM 3D SCREEN BREAST BILATERAL; Future

## 2020-06-30 ENCOUNTER — Other Ambulatory Visit: Payer: Self-pay | Admitting: Family Medicine

## 2020-06-30 ENCOUNTER — Encounter: Payer: Self-pay | Admitting: Family Medicine

## 2020-06-30 ENCOUNTER — Other Ambulatory Visit: Payer: Self-pay

## 2020-06-30 ENCOUNTER — Ambulatory Visit (INDEPENDENT_AMBULATORY_CARE_PROVIDER_SITE_OTHER): Payer: Medicare Other | Admitting: Family Medicine

## 2020-06-30 VITALS — BP 132/74 | HR 89 | Temp 97.8°F | Resp 18 | Ht 67.0 in | Wt 176.0 lb

## 2020-06-30 DIAGNOSIS — E559 Vitamin D deficiency, unspecified: Secondary | ICD-10-CM | POA: Diagnosis not present

## 2020-06-30 DIAGNOSIS — I480 Paroxysmal atrial fibrillation: Secondary | ICD-10-CM

## 2020-06-30 DIAGNOSIS — G629 Polyneuropathy, unspecified: Secondary | ICD-10-CM | POA: Diagnosis not present

## 2020-06-30 DIAGNOSIS — M810 Age-related osteoporosis without current pathological fracture: Secondary | ICD-10-CM

## 2020-06-30 DIAGNOSIS — E538 Deficiency of other specified B group vitamins: Secondary | ICD-10-CM

## 2020-06-30 DIAGNOSIS — R1084 Generalized abdominal pain: Secondary | ICD-10-CM

## 2020-06-30 DIAGNOSIS — D692 Other nonthrombocytopenic purpura: Secondary | ICD-10-CM | POA: Diagnosis not present

## 2020-06-30 DIAGNOSIS — I25119 Atherosclerotic heart disease of native coronary artery with unspecified angina pectoris: Secondary | ICD-10-CM

## 2020-06-30 DIAGNOSIS — R269 Unspecified abnormalities of gait and mobility: Secondary | ICD-10-CM

## 2020-06-30 DIAGNOSIS — R7989 Other specified abnormal findings of blood chemistry: Secondary | ICD-10-CM

## 2020-06-30 DIAGNOSIS — K219 Gastro-esophageal reflux disease without esophagitis: Secondary | ICD-10-CM

## 2020-06-30 DIAGNOSIS — Z1231 Encounter for screening mammogram for malignant neoplasm of breast: Secondary | ICD-10-CM

## 2020-06-30 DIAGNOSIS — I272 Pulmonary hypertension, unspecified: Secondary | ICD-10-CM | POA: Diagnosis not present

## 2020-06-30 DIAGNOSIS — I700 Atherosclerosis of aorta: Secondary | ICD-10-CM | POA: Diagnosis not present

## 2020-06-30 DIAGNOSIS — R140 Abdominal distension (gaseous): Secondary | ICD-10-CM | POA: Diagnosis not present

## 2020-06-30 DIAGNOSIS — J439 Emphysema, unspecified: Secondary | ICD-10-CM

## 2020-06-30 DIAGNOSIS — R110 Nausea: Secondary | ICD-10-CM

## 2020-06-30 DIAGNOSIS — R198 Other specified symptoms and signs involving the digestive system and abdomen: Secondary | ICD-10-CM | POA: Diagnosis not present

## 2020-06-30 DIAGNOSIS — J449 Chronic obstructive pulmonary disease, unspecified: Secondary | ICD-10-CM | POA: Diagnosis not present

## 2020-06-30 DIAGNOSIS — J432 Centrilobular emphysema: Secondary | ICD-10-CM

## 2020-07-03 ENCOUNTER — Other Ambulatory Visit: Payer: Self-pay | Admitting: Family Medicine

## 2020-07-03 ENCOUNTER — Other Ambulatory Visit: Payer: Self-pay

## 2020-07-03 DIAGNOSIS — I700 Atherosclerosis of aorta: Secondary | ICD-10-CM

## 2020-07-03 DIAGNOSIS — I25119 Atherosclerotic heart disease of native coronary artery with unspecified angina pectoris: Secondary | ICD-10-CM

## 2020-07-03 DIAGNOSIS — R7989 Other specified abnormal findings of blood chemistry: Secondary | ICD-10-CM

## 2020-07-03 DIAGNOSIS — E782 Mixed hyperlipidemia: Secondary | ICD-10-CM

## 2020-07-03 LAB — COMPLETE METABOLIC PANEL WITH GFR
AG Ratio: 1.5 (calc) (ref 1.0–2.5)
ALT: 28 U/L (ref 6–29)
AST: 27 U/L (ref 10–35)
Albumin: 4.1 g/dL (ref 3.6–5.1)
Alkaline phosphatase (APISO): 108 U/L (ref 37–153)
BUN: 12 mg/dL (ref 7–25)
CO2: 26 mmol/L (ref 20–32)
Calcium: 9.4 mg/dL (ref 8.6–10.4)
Chloride: 107 mmol/L (ref 98–110)
Creat: 0.84 mg/dL (ref 0.60–0.93)
GFR, Est African American: 79 mL/min/{1.73_m2} (ref >=60)
GFR, Est Non African American: 68 mL/min/{1.73_m2} (ref >=60)
Globulin: 2.7 g/dL (calc) (ref 1.9–3.7)
Glucose, Bld: 97 mg/dL (ref 65–99)
Potassium: 5.1 mmol/L (ref 3.5–5.3)
Sodium: 142 mmol/L (ref 135–146)
Total Bilirubin: 0.8 mg/dL (ref 0.2–1.2)
Total Protein: 6.8 g/dL (ref 6.1–8.1)

## 2020-07-03 LAB — CBC WITH DIFFERENTIAL/PLATELET
Absolute Monocytes: 656 cells/uL (ref 200–950)
Basophils Absolute: 90 cells/uL (ref 0–200)
Basophils Relative: 1.1 %
Eosinophils Absolute: 238 cells/uL (ref 15–500)
Eosinophils Relative: 2.9 %
HCT: 41.3 % (ref 35.0–45.0)
Hemoglobin: 13.6 g/dL (ref 11.7–15.5)
Lymphs Abs: 2157 cells/uL (ref 850–3900)
MCH: 28.8 pg (ref 27.0–33.0)
MCHC: 32.9 g/dL (ref 32.0–36.0)
MCV: 87.3 fL (ref 80.0–100.0)
MPV: 10.6 fL (ref 7.5–12.5)
Monocytes Relative: 8 %
Neutro Abs: 5059 cells/uL (ref 1500–7800)
Neutrophils Relative %: 61.7 %
Platelets: 239 10*3/uL (ref 140–400)
RBC: 4.73 10*6/uL (ref 3.80–5.10)
RDW: 13.3 % (ref 11.0–15.0)
Total Lymphocyte: 26.3 %
WBC: 8.2 10*3/uL (ref 3.8–10.8)

## 2020-07-03 LAB — LIPID PANEL
Cholesterol: 140 mg/dL (ref <200)
HDL: 33 mg/dL — ABNORMAL LOW (ref >=50)
LDL Cholesterol (Calc): 75 mg/dL (calc)
Non-HDL Cholesterol (Calc): 107 mg/dL (calc) (ref <130)
Total CHOL/HDL Ratio: 4.2 (calc) (ref <5.0)
Triglycerides: 218 mg/dL — ABNORMAL HIGH (ref <150)

## 2020-07-03 LAB — VITAMIN D 25 HYDROXY (VIT D DEFICIENCY, FRACTURES): Vit D, 25-Hydroxy: 37 ng/mL (ref 30–100)

## 2020-07-03 LAB — T3: T3, Total: 125 ng/dL (ref 76–181)

## 2020-07-03 LAB — VITAMIN B12: Vitamin B-12: 257 pg/mL (ref 200–1100)

## 2020-07-03 LAB — LIPASE: Lipase: 16 U/L (ref 7–60)

## 2020-07-03 LAB — THYROGLOBULIN LEVEL: Thyroglobulin: 3.5 ng/mL

## 2020-07-03 LAB — TEST AUTHORIZATION

## 2020-07-03 LAB — TSH: TSH: 4.88 mIU/L — ABNORMAL HIGH (ref 0.40–4.50)

## 2020-07-03 MED ORDER — ATORVASTATIN CALCIUM 40 MG PO TABS: 40.0000 mg | ORAL_TABLET | Freq: Every day | ORAL | 3 refills | Status: DC

## 2020-07-05 ENCOUNTER — Ambulatory Visit
Admission: RE | Admit: 2020-07-05 | Discharge: 2020-07-05 | Disposition: A | Discharge status: Home / Self Care | Payer: Medicare Other | Source: Ambulatory Visit | Attending: Family Medicine | Admitting: Family Medicine

## 2020-07-05 ENCOUNTER — Other Ambulatory Visit: Payer: Self-pay

## 2020-07-05 DIAGNOSIS — Z1231 Encounter for screening mammogram for malignant neoplasm of breast: Secondary | ICD-10-CM | POA: Diagnosis not present

## 2020-07-10 ENCOUNTER — Encounter: Payer: Self-pay | Admitting: Gastroenterology

## 2020-07-19 ENCOUNTER — Other Ambulatory Visit: Payer: Self-pay

## 2020-07-19 DIAGNOSIS — J0100 Acute maxillary sinusitis, unspecified: Secondary | ICD-10-CM

## 2020-07-19 DIAGNOSIS — H6692 Otitis media, unspecified, left ear: Secondary | ICD-10-CM

## 2020-07-19 MED ORDER — FLUTICASONE PROPIONATE 50 MCG/ACT NA SUSP: 2.0000 | Freq: Every day | NASAL | 6 refills | Status: DC

## 2020-07-21 ENCOUNTER — Ambulatory Visit (INDEPENDENT_AMBULATORY_CARE_PROVIDER_SITE_OTHER): Payer: Medicare Other | Admitting: Vascular Surgery

## 2020-07-21 ENCOUNTER — Other Ambulatory Visit: Payer: Self-pay

## 2020-07-21 ENCOUNTER — Ambulatory Visit (INDEPENDENT_AMBULATORY_CARE_PROVIDER_SITE_OTHER): Payer: Medicare Other

## 2020-07-21 VITALS — BP 119/67 | HR 52 | Ht 71.0 in | Wt 174.0 lb

## 2020-07-21 DIAGNOSIS — I6523 Occlusion and stenosis of bilateral carotid arteries: Secondary | ICD-10-CM | POA: Diagnosis not present

## 2020-07-21 DIAGNOSIS — E782 Mixed hyperlipidemia: Secondary | ICD-10-CM

## 2020-07-21 NOTE — Assessment & Plan Note (Signed)
Carotid duplex shows a patent left CEA and 1-39% right ICA stenosis. Doing well. Continue medicines. Recheck in one year.

## 2020-07-21 NOTE — Progress Notes (Signed)
MRN : [id number]  Kristie Walters is a 75 y.o. (02/10/1946) female who presents with chief complaint of  Chief Complaint  Patient presents with  . Follow-up    89yr Carotid  .  History of Present Illness: Patient returns in follow up of her carotid disease.  She is 6 years s/p left CEA. Doing well today. No complaints. Specifically, the patient denies amaurosis fugax, speech or swallowing difficulties, or arm or leg weakness or numbness. Carotid duplex shows a patent left CEA and 1-39% right ICA stenosis.  Current Outpatient Medications  Medication Sig Dispense Refill  . albuterol (VENTOLIN HFA) 108 (90 Base) MCG/ACT inhaler Inhale 2 puffs into the lungs every 6 (six) hours as needed for wheezing or shortness of breath. 8 g 2  . aspirin 81 MG tablet Take 81 mg by mouth daily.    [name] atorvastatin (LIPITOR) 40 MG tablet Take 1 tablet (40 mg total) by mouth daily. 90 tablet 3  . calcium carbonate (OS-CAL - DOSED IN MG OF ELEMENTAL CALCIUM) 1250 (500 Ca) MG tablet Take 1 tablet by mouth.    . cholecalciferol (VITAMIN D) 1000 units tablet Take 1,000 Units by mouth daily.    [name] ELIQUIS 5 MG TABS tablet TAKE 1 TABLET BY MOUTH TWICE A DAY 180 tablet 1  . ezetimibe (ZETIA) 10 MG tablet Take 1 tablet (10 mg total) by mouth daily. 90 tablet 3  . Fish Oil-Cholecalciferol (FISH OIL + D3 PO) Take 1,000 mg by mouth 1 day or 1 dose.    . fluticasone (FLONASE) 50 MCG/ACT nasal spray Place 2 sprays into both nostrils daily. 16 g 6  . metoprolol tartrate (LOPRESSOR) 25 MG tablet Take 0.5 tablets (12.5 mg total) by mouth 2 (two) times daily. 90 tablet 3  . omeprazole (PRILOSEC) 40 MG capsule TAKE 1 CAPSULE (40 MG TOTAL) BY MOUTH AS NEEDED. 90 capsule 1  . pregabalin (LYRICA) 50 MG capsule Take 1 capsule (50 mg total) by mouth 3 (three) times daily. 270 capsule 1  . TRELEGY ELLIPTA 100-62.5-25 MCG/INH AEPB Take 1 puff by mouth daily.     No current facility-administered medications for this visit.    Past  Medical History:  Diagnosis Date  . Allergy   . Atrial fibrillation ([place])   . Basal cell carcinoma of nose 08/12/2017  . Carotid artery disease ([place])    s/p L. CEA in July 2016  . Centrilobular emphysema ([place]) 09/29/2017  . Collagen vascular disease ([place])    Left carotid stenosis  . COPD (chronic obstructive pulmonary disease) ([place])   . Coronary artery disease 09/29/2017   Noted on chest CT July 2019  . Elevated blood pressure (not hypertension)   . Fatty liver 09/29/2017   Chest CT July 2019  . GERD (gastroesophageal reflux disease)   . Hyperlipidemia   . Personal history of tobacco use, presenting hazards to health 08/31/2015  . PONV (postoperative nausea and vomiting)     Past Surgical History:  Procedure Laterality Date  . BREAST BIOPSY    . ENDARTERECTOMY Left 08/25/2014   Procedure: ENDARTERECTOMY CAROTID;  Surgeon: Algernon Huxley, MD;  Location: [facility] ORS;  Service: Vascular;  Laterality: Left;  . MOHS SURGERY    . MOHS SURGERY  09/23/2017   nose  . PERIPHERAL VASCULAR CATHETERIZATION N/A 07/20/2014   Procedure: Carotid Angiography;  Surgeon: Algernon Huxley, MD;  Location: [facility];  Service: Cardiovascular;  Laterality: N/A;  . TONSILLECTOMY    .  TUBAL LIGATION       Social History   Tobacco Use  . Smoking status: Former Smoker    Packs/day: 1.00    Years: 55.00    Pack years: 55.00    Types: Cigarettes    Quit date: 11/26/2016    Years since quitting: 3.6  . Smokeless tobacco: Current User    Types: Snuff  . Tobacco comment: smoking cessation materials not required  Vaping Use  . Vaping Use: Never used  Substance Use Topics  . Alcohol use: No  . Drug use: No     Family History  Problem Relation Age of Onset  . Heart attack Mother   . Hypercholesterolemia Mother   . Hypertension Mother   . Peripheral vascular disease Mother   . Dementia Mother   . Hypothyroidism Mother   . CVA Father   . Liver cancer Father   . Diabetes Brother   . Kidney cancer  Sister   . Diabetes Brother   . Alzheimer's disease Brother   . Other Brother        alzheimers  . Lymphoma Son   . HIV Son   . Breast cancer Neg Hx     Allergies  Allergen Reactions  . Morphine Nausea Only and Nausea And Vomiting  . Morphine And Related Nausea And Vomiting     REVIEW OF SYSTEMS (Negative unless checked)  Constitutional: [] ?Weight loss  [] ?Fever  [] ?Chills Cardiac: [] ?Chest pain   [] ?Chest pressure   [x] ?Palpitations   [] ?Shortness of breath when laying flat   [] ?Shortness of breath at rest   [x] ?Shortness of breath with exertion. Vascular:  [] ?Pain in legs with walking   [] ?Pain in legs at rest   [] ?Pain in legs when laying flat   [] ?Claudication   [] ?Pain in feet when walking  [] ?Pain in feet at rest  [] ?Pain in feet when laying flat   [] ?History of DVT   [] ?Phlebitis   [] ?Swelling in legs   [] ?Varicose veins   [] ?Non-healing ulcers Pulmonary:   [] ?Uses home oxygen   [] ?Productive cough   [] ?Hemoptysis   [] ?Wheeze  [x] ?COPD   [] ?Asthma Neurologic:  [] ?Dizziness  [] ?Blackouts   [] ?Seizures   [] ?History of stroke   [] ?History of TIA  [] ?Aphasia   [] ?Temporary blindness   [] ?Dysphagia   [] ?Weakness or numbness in arms   [] ?Weakness or numbness in legs Musculoskeletal:  [] ?Arthritis   [] ?Joint swelling   [] ?Joint pain   [] ?Low back pain Hematologic:  [] ?Easy bruising  [] ?Easy bleeding   [] ?Hypercoagulable state   [] ?Anemic  [] ?Hepatitis Gastrointestinal:  [] ?Blood in stool   [] ?Vomiting blood  [x] ?Gastroesophageal reflux/heartburn   [] ?Difficulty swallowing. Genitourinary:  [] ?Chronic kidney disease   [] ?Difficult urination  [] ?Frequent urination  [] ?Burning with urination   [] ?Blood in urine Skin:  [] ?Rashes   [] ?Ulcers   [] ?Wounds Psychological:  [] ?History of anxiety   [] ? History of major depression.  Physical Examination  Vitals:   07/21/20 1119  BP: 119/67  Pulse: (!) 52  Weight: 174 lb (78.9 kg)  Height: 5\' 11"  (1.803 m)   Body mass index is 24.27  kg/m. Gen:  WD/WN, NAD Head: [place]/AT, No temporalis wasting. Ear/Nose/Throat: Hearing grossly intact, nares w/o erythema or drainage, trachea midline Eyes: Conjunctiva clear. Sclera non-icteric Neck: Supple.  No bruit  Pulmonary:  Good air movement, equal and clear to auscultation bilaterally.  Cardiac: RRR, No JVD Vascular:  Vessel Right Left  Radial Palpable Palpable  Musculoskeletal: M/S 5/5 throughout.  No deformity or atrophy. No edema. Neurologic: CN 2-12 intact. Sensation grossly intact in extremities.  Symmetrical.  Speech is fluent. Motor exam as listed above. Psychiatric: Judgment intact, Mood & affect appropriate for pt's clinical situation. Dermatologic: No rashes or ulcers noted.  No cellulitis or open wounds.      CBC Lab Results  Component Value Date   WBC 8.2 06/30/2020   HGB 13.6 06/30/2020   HCT 41.3 06/30/2020   MCV 87.3 06/30/2020   PLT 239 06/30/2020    BMET    Component Value Date/Time   NA 142 06/30/2020 1043   NA 143 02/24/2020 1113   NA 141 02/20/2014 1016   K 5.1 06/30/2020 1043   K 4.0 02/20/2014 1016   CL 107 06/30/2020 1043   CL 106 02/20/2014 1016   CO2 26 06/30/2020 1043   CO2 31 02/20/2014 1016   GLUCOSE 97 06/30/2020 1043   GLUCOSE 92 02/20/2014 1016   BUN 12 06/30/2020 1043   BUN 14 02/24/2020 1113   BUN 11 02/20/2014 1016   CREATININE 0.84 06/30/2020 1043   CALCIUM 9.4 06/30/2020 1043   CALCIUM 9.2 02/20/2014 1016   GFRNONAA 68 06/30/2020 1043   GFRAA 79 06/30/2020 1043   CrCl cannot be calculated (Patient's most recent lab result is older than the maximum 21 days allowed.).  COAG Lab Results  Component Value Date   INR 1.0 02/20/2014    Radiology MM 3D SCREEN BREAST BILATERAL  Result Date: 07/05/2020 CLINICAL DATA:  Screening. EXAM: DIGITAL SCREENING BILATERAL MAMMOGRAM WITH TOMOSYNTHESIS AND CAD TECHNIQUE: Bilateral screening digital craniocaudal and mediolateral oblique mammograms were obtained. Bilateral  screening digital breast tomosynthesis was performed. The images were evaluated with computer-aided detection. COMPARISON:  Previous exam(s). ACR Breast Density Category c: The breast tissue is heterogeneously dense, which may obscure small masses. FINDINGS: There are no findings suspicious for malignancy. The images were evaluated with computer-aided detection. IMPRESSION: No mammographic evidence of malignancy. A result letter of this screening mammogram will be mailed directly to the patient. RECOMMENDATION: Screening mammogram in one year. (Code:SM-B-01Y) BI-RADS CATEGORY  1: Negative. Electronically Signed   By: Franki Cabot M.D.   On: 07/05/2020 13:55   VAS US CAROTID  Result Date: 07/21/2020 Carotid Arterial Duplex Study Patient Name:  Kristie Walters  Date of Exam:   07/21/2020 Medical Rec #: [id number]      Accession #:    [id number] Date of Birth: May 10, 1945      Patient Gender: F Patient Age:   54Y Exam Location:  [facility] Procedure:      VAS US CAROTID Referring Phys: [id number] [place] --------------------------------------------------------------------------------  Indications:   Left endarterectomy. Other Factors: History of left endarterectomy 08/25/2014. Performing Technologist: Concha Norway RVT  Examination Guidelines: A complete evaluation includes B-mode imaging, spectral Doppler, color Doppler, and power Doppler as needed of all accessible portions of each vessel. Bilateral testing is considered an integral part of a complete examination. Limited examinations for reoccurring indications may be performed as noted.  Right Carotid Findings: +----------+--------+--------+--------+------------------+--------+           PSV cm/sEDV cm/sStenosisPlaque DescriptionComments +----------+--------+--------+--------+------------------+--------+ CCA Prox  84      17                                         +----------+--------+--------+--------+------------------+--------+ CCA Mid  81      18                                         +----------+--------+--------+--------+------------------+--------+ CCA Distal72      19                                         +----------+--------+--------+--------+------------------+--------+ ICA Prox  37      12                                         +----------+--------+--------+--------+------------------+--------+ ICA Mid   56      16      1-39%   heterogenous               +----------+--------+--------+--------+------------------+--------+ ICA Distal51      14                                         +----------+--------+--------+--------+------------------+--------+ ECA       79      10                                         +----------+--------+--------+--------+------------------+--------+ +----------+--------+-------+----------------+-------------------+           PSV cm/sEDV cmsDescribe        Arm Pressure (mmHG) +----------+--------+-------+----------------+-------------------+ [id number]             Multiphasic, WNL                    +----------+--------+-------+----------------+-------------------+ +---------+--------+--+--------+---------+ VertebralPSV cm/s46EDV cm/sAntegrade +---------+--------+--+--------+---------+  Left Carotid Findings: +----------+--------+--------+--------+------------------+--------+           PSV cm/sEDV cm/sStenosisPlaque DescriptionComments +----------+--------+--------+--------+------------------+--------+ CCA Prox  71      16                                         +----------+--------+--------+--------+------------------+--------+ CCA Mid   98      23                                         +----------+--------+--------+--------+------------------+--------+ CCA Distal93      22                                         +----------+--------+--------+--------+------------------+--------+ ICA Prox  87      21      1-39%    heterogenous               +----------+--------+--------+--------+------------------+--------+ ICA Mid   83      29                                         +----------+--------+--------+--------+------------------+--------+  ICA Distal76      24                                         +----------+--------+--------+--------+------------------+--------+ ECA       80      7                                          +----------+--------+--------+--------+------------------+--------+ +----------+--------+--------+----------------+-------------------+           PSV cm/sEDV cm/sDescribe        Arm Pressure (mmHG) +----------+--------+--------+----------------+-------------------+ [id number]             Multiphasic, WNL                    +----------+--------+--------+----------------+-------------------+ +---------+--------+--+--------+---------+ VertebralPSV cm/s54EDV cm/sAntegrade +---------+--------+--+--------+---------+   Summary: Right Carotid: Velocities in the right ICA are consistent with a 1-39% stenosis. Left Carotid: Velocities in the left ICA are consistent with a 1-39% stenosis. Vertebrals:  Bilateral vertebral arteries demonstrate antegrade flow. Subclavians: Normal flow hemodynamics were seen in bilateral subclavian              arteries. *See table(s) above for measurements and observations.  Electronically signed by Leotis Pain MD on 07/21/2020 at 10:55:11 AM.    Final      Assessment/Plan Carotid stenosis Carotid duplex shows a patent left CEA and 1-39% right ICA stenosis. Doing well. Continue medicines. Recheck in one year.  Hyperlipidemia lipid control important in reducing the progression of atherosclerotic disease. Continue statin therapy     Leotis Pain, MD  07/21/2020 11:53 AM    This note was created with Dragon medical transcription system.  Any errors from dictation are purely unintentional

## 2020-07-21 NOTE — Assessment & Plan Note (Signed)
lipid control important in reducing the progression of atherosclerotic disease. Continue statin therapy  

## 2020-08-07 ENCOUNTER — Other Ambulatory Visit: Payer: Self-pay

## 2020-08-07 ENCOUNTER — Ambulatory Visit
Admission: RE | Admit: 2020-08-07 | Discharge: 2020-08-07 | Disposition: A | Discharge status: Home / Self Care | Payer: Medicare Other | Source: Ambulatory Visit | Attending: Family Medicine | Admitting: Family Medicine

## 2020-08-07 DIAGNOSIS — R110 Nausea: Secondary | ICD-10-CM | POA: Diagnosis not present

## 2020-08-07 DIAGNOSIS — R1084 Generalized abdominal pain: Secondary | ICD-10-CM | POA: Insufficient documentation

## 2020-08-07 DIAGNOSIS — R140 Abdominal distension (gaseous): Secondary | ICD-10-CM | POA: Diagnosis not present

## 2020-08-07 DIAGNOSIS — R198 Other specified symptoms and signs involving the digestive system and abdomen: Secondary | ICD-10-CM | POA: Insufficient documentation

## 2020-08-07 DIAGNOSIS — K7689 Other specified diseases of liver: Secondary | ICD-10-CM | POA: Diagnosis not present

## 2020-08-08 ENCOUNTER — Ambulatory Visit (INDEPENDENT_AMBULATORY_CARE_PROVIDER_SITE_OTHER): Payer: Medicare Other

## 2020-08-08 VITALS — BP 118/66 | HR 76 | Temp 98.1°F | Resp 16 | Ht 71.0 in | Wt 174.4 lb

## 2020-08-08 DIAGNOSIS — Z0000 Encounter for general adult medical examination without abnormal findings: Secondary | ICD-10-CM | POA: Diagnosis not present

## 2020-08-08 NOTE — Progress Notes (Signed)
Subjective:   Kristie Walters is a 75 y.o. female who presents for Medicare Annual (Subsequent) preventive examination.  Review of Systems     Cardiac Risk Factors include: advanced age (>23men, >74 women);hypertension;dyslipidemia     Objective:    Today's Vitals   08/08/20 0915 08/08/20 0930  BP: 118/66   Pulse: 76   Resp: 16   Temp: 98.1 F (36.7 C)   TempSrc: Oral   SpO2: 96%   Weight: 174 lb 6.4 oz (79.1 kg)   Height: 5\' 11"  (1.803 m)   PainSc:  6    Body mass index is 24.32 kg/m.  Advanced Directives 08/08/2020 08/05/2019 03/06/2019 07/31/2018 07/25/2017 11/27/2016 11/26/2016  Does Patient Have a Medical Advance Directive? Yes Yes No Yes Yes No Yes  Type of Paramedic of [place];Living will [place];Living will - [place];Living will [place];Living will - [place]  Does patient want to make changes to medical advance directive? - - - - - - -  Copy of [place] in Chart? Yes - validated most recent copy scanned in chart (See row information) Yes - validated most recent copy scanned in chart (See row information) - Yes - validated most recent copy scanned in chart (See row information) Yes - -  Would patient like information on creating a medical advance directive? - - No - Patient declined - - No - Patient declined -    Current Medications (verified) Outpatient Encounter Medications as of 08/08/2020  Medication Sig   albuterol (VENTOLIN HFA) 108 (90 Base) MCG/ACT inhaler Inhale 2 puffs into the lungs every 6 (six) hours as needed for wheezing or shortness of breath.   aspirin 81 MG tablet Take 81 mg by mouth daily.   atorvastatin (LIPITOR) 40 MG tablet Take 1 tablet (40 mg total) by mouth daily.   calcium carbonate (OS-CAL - DOSED IN MG OF ELEMENTAL CALCIUM) 1250 (500 Ca) MG tablet Take 1 tablet by mouth.   cholecalciferol (VITAMIN D) 1000 units tablet  Take 1,000 Units by mouth daily.   Cyanocobalamin (VITAMIN B-12) 5000 MCG SUBL Place under the tongue.   ELIQUIS 5 MG TABS tablet TAKE 1 TABLET BY MOUTH TWICE A DAY   ezetimibe (ZETIA) 10 MG tablet Take 1 tablet (10 mg total) by mouth daily.   Fish Oil-Cholecalciferol (FISH OIL + D3 PO) Take 1,000 mg by mouth 1 day or 1 dose.   fluticasone (FLONASE) 50 MCG/ACT nasal spray Place 2 sprays into both nostrils daily.   metoprolol tartrate (LOPRESSOR) 25 MG tablet Take 0.5 tablets (12.5 mg total) by mouth 2 (two) times daily.   omeprazole (PRILOSEC) 40 MG capsule TAKE 1 CAPSULE (40 MG TOTAL) BY MOUTH AS NEEDED.   pregabalin (LYRICA) 50 MG capsule Take 1 capsule (50 mg total) by mouth 3 (three) times daily.   TRELEGY ELLIPTA 100-62.5-25 MCG/INH AEPB Take 1 puff by mouth daily.   No facility-administered encounter medications on file as of 08/08/2020.    Allergies (verified) Morphine and Morphine and related   History: Past Medical History:  Diagnosis Date   Allergy    Atrial fibrillation ([place])    Basal cell carcinoma of nose 08/12/2017   Carotid artery disease ([facility])    s/p L. 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/29/2017   Collagen vascular disease ([place])    Left carotid stenosis   COPD (chronic obstructive pulmonary disease) ([place])  Coronary artery disease 09/29/2017   Noted on chest CT July 2019   Elevated blood pressure (not hypertension)    Fatty liver 09/29/2017   Chest CT July 2019   GERD (gastroesophageal reflux disease)    Hyperlipidemia    Personal history of tobacco use, presenting hazards to health 08/31/2015   PONV (postoperative nausea and vomiting)    Past Surgical History:  Procedure Laterality Date   BREAST BIOPSY     ENDARTERECTOMY Left 08/25/2014   Procedure: ENDARTERECTOMY CAROTID;  Surgeon: Algernon Huxley, MD;  Location: [facility] ORS;  Service: Vascular;  Laterality: Left;   MOHS SURGERY     MOHS SURGERY  09/23/2017   nose   PERIPHERAL VASCULAR  CATHETERIZATION N/A 07/20/2014   Procedure: Carotid Angiography;  Surgeon: Algernon Huxley, MD;  Location: [place] [facility];  Service: Cardiovascular;  Laterality: N/A;   TONSILLECTOMY     TUBAL LIGATION     Family History  Problem Relation Age of Onset   Heart attack Mother    Hypercholesterolemia Mother    Hypertension Mother    Peripheral vascular disease Mother    Dementia Mother    Hypothyroidism Mother    CVA Father    Liver cancer Father    Diabetes Brother    Kidney cancer Sister    Diabetes Brother    Alzheimer's disease Brother    Other Brother        alzheimers   Lymphoma Son    HIV Son    Breast cancer Neg Hx    Social History   Socioeconomic History   Marital status: Divorced    Spouse name: Not on file   Number of children: 2   Years of education: Not on file   Highest education level: 10th grade  Occupational History   Occupation: Retired  Tobacco Use   Smoking status: Former    Packs/day: 1.00    Years: 55.00    Pack years: 55.00    Types: Cigarettes    Quit date: 11/26/2016    Years since quitting: 3.7   Smokeless tobacco: Former    Types: Snuff   Tobacco comments:    smoking cessation materials not required  Vaping Use   Vaping Use: Never used  Substance and Sexual Activity   Alcohol use: No   Drug use: No   Sexual activity: Not Currently  Other Topics Concern   Not on file  Social History Narrative    Pt lives alone   Social Determinants of Health   Financial Resource Strain: Low Risk    Difficulty of Paying Living Expenses: Not hard at all  Food Insecurity: No Food Insecurity   Worried About Charity fundraiser in the Last Year: Never true   [place] in the Last Year: Never true  Transportation Needs: No Transportation Needs   Lack of Transportation (Medical): No   Lack of Transportation (Non-Medical): No  Physical Activity: Inactive   Days of Exercise per Week: 0 days   Minutes of Exercise per Session: 0 min  Stress: No  Stress Concern Present   Feeling of Stress : Not at all  Social Connections: Moderately Isolated   Frequency of Communication with Friends and Family: More than three times a week   Frequency of Social Gatherings with Friends and Family: Three times a week   Attends Religious Services: More than 4 times per year   Active Member of Clubs or Organizations: No   Attends Club  or Organization Meetings: Never   Marital Status: Divorced    Tobacco Counseling Counseling given: Not Answered Tobacco comments: smoking cessation materials not required   Clinical Intake:  Pre-visit preparation completed: Yes  Pain : 0-10 Pain Score: 6  Pain Type: Acute pain Pain Location: Abdomen Pain Orientation: Upper, Mid, Lower, Anterior Pain Descriptors / Indicators: Sore Pain Onset: More than a month ago Pain Frequency: Constant     BMI - recorded: 24.32 Nutritional Status: BMI of 19-24  Normal Nutritional Risks: Nausea/ vomitting/ diarrhea (nausea) Diabetes: No  How often do you need to have someone help you when you read instructions, pamphlets, or other written materials from your doctor or pharmacy?: 1 - Never   Interpreter Needed?: No  Information entered by :: Clemetine Marker LPN   Activities of Daily Living In your present state of health, do you have any difficulty performing the following activities: 08/08/2020 06/30/2020  Hearing? Y N  Comment declines hearing aids -  Vision? N N  Difficulty concentrating or making decisions? N N  Walking or climbing stairs? Y Y  Dressing or bathing? N N  Doing errands, shopping? N N  Preparing Food and eating ? N -  Using the Toilet? N -  In the past six months, have you accidently leaked urine? N -  Do you have problems with loss of bowel control? N -  Managing your Medications? N -  Managing your Finances? N -  Housekeeping or managing your Housekeeping? N -  Some recent data might be hidden    Patient Care Team: Steele Sizer, MD as  PCP - General (Family Medicine) Rockey Situ Kathlene November, MD as PCP - Cardiology (Cardiology) Lucky Cowboy Erskine Squibb, MD as Consulting Physician (Vascular Surgery) Jamelle Rushing, MD as Referring Physician (Dermatology) Minna Merritts, MD as Consulting Physician (Cardiology)  Indicate any recent Medical Services you may have received from other than [facility] providers in the past year (date may be approximate).     Assessment:   This is a routine wellness examination for [place].  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 Declines referral to ophthalmology.   Dietary issues and exercise activities discussed: Current Exercise Habits: The patient does not participate in regular exercise at present, Exercise limited by: orthopedic condition(s);neurologic condition(s);respiratory conditions(s)   Goals Addressed             This Visit's Progress    DIET - INCREASE WATER INTAKE   On track    Recommend to drink at least 6-8 8oz glasses of water per day.        Depression Screen PHQ 2/9 Scores 08/08/2020 06/30/2020 12/31/2019 12/10/2019 11/03/2019 09/30/2019 09/15/2019  PHQ - 2 Score 0 0 1 0 1 1 3   PHQ- 9 Score - - 3 - - 3 9    Fall Risk Fall Risk  08/08/2020 06/30/2020 12/31/2019 12/10/2019 09/30/2019  Falls in the past year? 0 0 0 0 0  Comment - - - - -  Number falls in past yr: 0 0 0 0 0  Injury with Fall? 0 0 0 0 0  Risk for fall due to : No Fall Risks - - - -  Risk for fall due to: Comment - - - - -  Follow up Falls prevention discussed - - - -    FALL RISK PREVENTION PERTAINING  TO THE HOME:  Any stairs in or around the home? Yes  If so, are there any without handrails? No  Home free of loose throw rugs in walkways, pet beds, electrical cords, etc? Yes  Adequate lighting in your home to reduce risk of falls? Yes   ASSISTIVE  DEVICES UTILIZED TO PREVENT FALLS:  Life alert? Yes  Use of a cane, walker or w/c? Yes  Grab bars in the bathroom? Yes  Shower chair or bench in shower? Yes  Elevated toilet seat or a handicapped toilet? Yes   TIMED UP AND GO:  Was the test performed? Yes .  Length of time to ambulate 10 feet: 7 sec.   Gait slow and steady with assistive device  Cognitive Function: Normal cognitive status assessed by direct observation by this Nurse Health Advisor. No abnormalities found.       6CIT Screen 08/05/2019 07/31/2018 07/25/2017 11/18/2016  What Year? 0 points 0 points 0 points 0 points  What month? 0 points 0 points 0 points 0 points  What time? 0 points 0 points 0 points 0 points  Count back from 20 0 points 0 points 0 points 0 points  Months in reverse 0 points 0 points 0 points 0 points  Repeat phrase 2 points 0 points 0 points 0 points  Total Score 2 0 0 0    Immunizations Immunization History  Administered Date(s) Administered   Influenza, High Dose Seasonal PF 10/08/2016, 10/01/2018, 10/08/2019   Influenza-Unspecified 11/19/2014, 11/16/2015, 10/08/2016, 09/18/2017   Moderna Sars-Covid-2 Vaccination 01/05/2020, 07/27/2020   PFIZER(Purple Top)SARS-COV-2 Vaccination 04/21/2019, 05/12/2019   Pneumococcal Conjugate-13 11/18/2016   Pneumococcal Polysaccharide-23 06/11/2012, 06/01/2020   Td 02/18/2009    TDAP status: Due, Education has been provided regarding the importance of this vaccine. Advised may receive this vaccine at local pharmacy or Health Dept. Aware to provide a copy of the vaccination record if obtained from local pharmacy or Health Dept. Verbalized acceptance and understanding.  Flu Vaccine status: Up to date  Pneumococcal vaccine status: Up to date  Covid-19 vaccine status: Completed vaccines  Qualifies for Shingles Vaccine? Yes   Zostavax completed No   Shingrix Completed?: No.    Education has been provided regarding the importance of this vaccine. Patient has  been advised to call insurance company to determine out of pocket expense if they have not yet received this vaccine. Advised may also receive vaccine at local pharmacy or Health Dept. Verbalized acceptance and understanding.  Screening Tests Health Maintenance  Topic Date Due   Zoster Vaccines- Shingrix (1 of 2) Never done   TETANUS/TDAP  06/30/2021 (Originally 02/19/2019)   INFLUENZA VACCINE  09/18/2020   COVID-19 Vaccine (5 - Booster) 11/26/2020   MAMMOGRAM  07/05/2021   COLONOSCOPY (Pts 45-57yrs Insurance coverage will need to be confirmed)  06/24/2022   DEXA SCAN  Completed   Hepatitis C Screening  Completed   PNA vac Low Risk Adult  Completed   HPV VACCINES  Aged Out    Health Maintenance  Health Maintenance Due  Topic Date Due   Zoster Vaccines- Shingrix (1 of 2) Never done    Colorectal cancer screening: Type of screening: Colonoscopy. Completed 06/23/12. Repeat every 10 years  Mammogram status: Completed 07/05/20. Repeat every year  Bone Density status: Completed 09/10/17. Results reflect: Bone density results: OSTEOPOROSIS. Repeat every 2 years. Pt declines repeat screening at this time.   Lung Cancer Screening: (Low Dose CT Chest recommended if Age 80-80 years, 30 pack-year currently smoking OR  have quit w/in 15years.) does qualify. Completed 09/16/19  Additional Screening:  Hepatitis C Screening: does qualify; Completed 11/18/16  Vision Screening: Recommended annual ophthalmology exams for early detection of glaucoma and other disorders of the eye. Is the patient up to date with their annual eye exam?  No  Who is the provider or what is the name of the office in which the patient attends annual eye exams? Not established If pt is not established with a provider, would they like to be referred to a provider to establish care? No .   Dental Screening: Recommended annual dental exams for proper oral hygiene  Community Resource Referral / Chronic Care Management: CRR  required this visit?  No   CCM required this visit?  No      Plan:     I have personally reviewed and noted the following in the patient's chart:   Medical and social history Use of alcohol, tobacco or illicit drugs  Current medications and supplements including opioid prescriptions.  Functional ability and status Nutritional status Physical activity Advanced directives List of other physicians Hospitalizations, surgeries, and ER visits in previous 12 months Vitals Screenings to include cognitive, depression, and falls Referrals and appointments  In addition, I have reviewed and discussed with patient certain preventive protocols, quality metrics, and best practice recommendations. A written personalized care plan for preventive services as well as general preventive health recommendations were provided to patient.     Clemetine Marker, LPN   [id number]   Nurse Notes: pt c/o bloating and soreness in abdomen; also constipation and mild nausea. Pt had ultrasound of abdomen yesterday that showed fatty liver. Pt scheduled with Dr. Allen Norris 08/15/20 for eval.

## 2020-08-08 NOTE — Patient Instructions (Signed)
[name] , Thank you for taking time to come for your Medicare Wellness Visit. I appreciate your ongoing commitment to your health goals. Please review the following plan we discussed and let me know if I can assist you in the future.   Screening recommendations/referrals: Colonoscopy: done 06/23/12. Repeat in 2024 or as directed.  Mammogram: done 07/05/20 Bone Density: done 09/10/17 Recommended yearly ophthalmology/optometry visit for glaucoma screening and checkup Recommended yearly dental visit for hygiene and checkup  Vaccinations: Influenza vaccine: done 10/08/19 Pneumococcal vaccine: done 06/01/20 Tdap vaccine: due Shingles vaccine: Shingrix discussed. Please contact your pharmacy for coverage information.  Covid-19: done 04/21/19, 05/12/19, 01/05/20 & 07/27/20  Conditions/risks identified: Recommend increasing physical activity as tolerated.   Next appointment: Follow up in one year for your annual wellness visit    Preventive Care 65 Years and Older, Female Preventive care refers to lifestyle choices and visits with your health care provider that can promote health and wellness. What does preventive care include? A yearly physical exam. This is also called an annual well check. Dental exams once or twice a year. Routine eye exams. Ask your health care provider how often you should have your eyes checked. Personal lifestyle choices, including: Daily care of your teeth and gums. Regular physical activity. Eating a healthy diet. Avoiding tobacco and drug use. Limiting alcohol use. Practicing safe sex. Taking low-dose aspirin every day. Taking vitamin and mineral supplements as recommended by your health care provider. What happens during an annual well check? The services and screenings done by your health care provider during your annual well check will depend on your age, overall health, lifestyle risk factors, and family history of disease. Counseling  Your health care provider  may ask you questions about your: Alcohol use. Tobacco use. Drug use. Emotional well-being. Home and relationship well-being. Sexual activity. Eating habits. History of falls. Memory and ability to understand (cognition). Work and work Statistician. Reproductive health. Screening  You may have the following tests or measurements: Height, weight, and BMI. Blood pressure. Lipid and cholesterol levels. These may be checked every 5 years, or more frequently if you are over 34 years old. Skin check. Lung cancer screening. You may have this screening every year starting at age 62 if you have a 30-pack-year history of smoking and currently smoke or have quit within the past 15 years. Fecal occult blood test (FOBT) of the stool. You may have this test every year starting at age 21. Flexible sigmoidoscopy or colonoscopy. You may have a sigmoidoscopy every 5 years or a colonoscopy every 10 years starting at age 49. Hepatitis C blood test. Hepatitis B blood test. Sexually transmitted disease (STD) testing. Diabetes screening. This is done by checking your blood sugar (glucose) after you have not eaten for a while (fasting). You may have this done every 1-3 years. Bone density scan. This is done to screen for osteoporosis. You may have this done starting at age 28. Mammogram. This may be done every 1-2 years. Talk to your health care provider about how often you should have regular mammograms. Talk with your health care provider about your test results, treatment options, and if necessary, the need for more tests. Vaccines  Your health care provider may recommend certain vaccines, such as: Influenza vaccine. This is recommended every year. Tetanus, diphtheria, and acellular pertussis (Tdap, Td) vaccine. You may need a Td booster every 10 years. Zoster vaccine. You may need this after age 25. Pneumococcal 13-valent conjugate (PCV13) vaccine. One dose is  recommended after age 66. Pneumococcal  polysaccharide (PPSV23) vaccine. One dose is recommended after age 43. Talk to your health care provider about which screenings and vaccines you need and how often you need them. This information is not intended to replace advice given to you by your health care provider. Make sure you discuss any questions you have with your health care provider. Document Released: 03/03/2015 Document Revised: 10/25/2015 Document Reviewed: 12/06/2014 Elsevier Interactive Patient Education  2017 [place] Prevention in the Home Falls can cause injuries. They can happen to people of all ages. There are many things you can do to make your home safe and to help prevent falls. What can I do on the outside of my home? Regularly fix the edges of walkways and driveways and fix any cracks. Remove anything that might make you trip as you walk through a door, such as a raised step or threshold. Trim any bushes or trees on the path to your home. Use bright outdoor lighting. Clear any walking paths of anything that might make someone trip, such as rocks or tools. Regularly check to see if handrails are loose or broken. Make sure that both sides of any steps have handrails. Any raised decks and porches should have guardrails on the edges. Have any leaves, snow, or ice cleared regularly. Use sand or salt on walking paths during winter. Clean up any spills in your garage right away. This includes oil or grease spills. What can I do in the bathroom? Use night lights. Install grab bars by the toilet and in the tub and shower. Do not use towel bars as grab bars. Use non-skid mats or decals in the tub or shower. If you need to sit down in the shower, use a plastic, non-slip stool. Keep the floor dry. Clean up any water that spills on the floor as soon as it happens. Remove soap buildup in the tub or shower regularly. Attach bath mats securely with double-sided non-slip rug tape. Do not have throw rugs and other  things on the floor that can make you trip. What can I do in the bedroom? Use night lights. Make sure that you have a light by your bed that is easy to reach. Do not use any sheets or blankets that are too big for your bed. They should not hang down onto the floor. Have a firm chair that has side arms. You can use this for support while you get dressed. Do not have throw rugs and other things on the floor that can make you trip. What can I do in the kitchen? Clean up any spills right away. Avoid walking on wet floors. Keep items that you use a lot in easy-to-reach places. If you need to reach something above you, use a strong step stool that has a grab bar. Keep electrical cords out of the way. Do not use floor polish or wax that makes floors slippery. If you must use wax, use non-skid floor wax. Do not have throw rugs and other things on the floor that can make you trip. What can I do with my stairs? Do not leave any items on the stairs. Make sure that there are handrails on both sides of the stairs and use them. Fix handrails that are broken or loose. Make sure that handrails are as long as the stairways. Check any carpeting to make sure that it is firmly attached to the stairs. Fix any carpet that is loose or worn. Avoid having  throw rugs at the top or bottom of the stairs. If you do have throw rugs, attach them to the floor with carpet tape. Make sure that you have a light switch at the top of the stairs and the bottom of the stairs. If you do not have them, ask someone to add them for you. What else can I do to help prevent falls? Wear shoes that: Do not have high heels. Have rubber bottoms. Are comfortable and fit you well. Are closed at the toe. Do not wear sandals. If you use a stepladder: Make sure that it is fully opened. Do not climb a closed stepladder. Make sure that both sides of the stepladder are locked into place. Ask someone to hold it for you, if possible. Clearly  mark and make sure that you can see: Any grab bars or handrails. First and last steps. Where the edge of each step is. Use tools that help you move around (mobility aids) if they are needed. These include: Canes. Walkers. Scooters. Crutches. Turn on the lights when you go into a dark area. Replace any light bulbs as soon as they burn out. Set up your furniture so you have a clear path. Avoid moving your furniture around. If any of your floors are uneven, fix them. If there are any pets around you, be aware of where they are. Review your medicines with your doctor. Some medicines can make you feel dizzy. This can increase your chance of falling. Ask your doctor what other things that you can do to help prevent falls. This information is not intended to replace advice given to you by your health care provider. Make sure you discuss any questions you have with your health care provider. Document Released: 12/01/2008 Document Revised: 07/13/2015 Document Reviewed: 03/11/2014 Elsevier Interactive Patient Education  2017 [employer].

## 2020-08-15 ENCOUNTER — Encounter: Payer: Self-pay | Admitting: Gastroenterology

## 2020-08-15 ENCOUNTER — Ambulatory Visit (INDEPENDENT_AMBULATORY_CARE_PROVIDER_SITE_OTHER): Payer: Medicare Other | Admitting: Gastroenterology

## 2020-08-15 ENCOUNTER — Other Ambulatory Visit: Payer: Self-pay

## 2020-08-15 VITALS — BP 104/62 | HR 76 | Ht 71.0 in | Wt 174.2 lb

## 2020-08-15 DIAGNOSIS — R140 Abdominal distension (gaseous): Secondary | ICD-10-CM

## 2020-08-15 MED ORDER — SUPREP BOWEL PREP KIT 17.5-3.13-1.6 GM/177ML PO SOLN: 1.0000 | ORAL | 0 refills | Status: DC

## 2020-08-15 NOTE — Progress Notes (Signed)
Gastroenterology Consultation  Referring Provider:     Steele Sizer, MD Primary Care Physician:  Steele Sizer, MD Primary Gastroenterologist:  Dr. Allen Norris     Reason for Consultation:     Abdominal bloating        HPI:   Kristie Walters is a 75 y.o. y/o female referred for consultation & management of abdominal bloating by Dr. Ancil Boozer, Drue Stager, MD. this patient comes in today with a history of abdominal bloating.  The patient reports that her abdomen is always distended and gets worse when she does move her bowels.  The patient has been more constipated recently.  She does drink some carbonated drinks but does not report any other activities that could be increasing her abdominal distention.  The patient had an abdominal ultrasound last week that showed:  IMPRESSION: 1. The echogenicity of the liver is increased. This is a nonspecific finding but is most commonly seen with fatty infiltration of the liver. There are no obvious focal liver lesions.   The patient also reports that she is constipated and she notices that her stomach does go down when she moves her bowels.  There is no report of any unexplained weight loss fevers chills nausea vomiting black stools or bloody stools.  Past Medical History:  Diagnosis Date   Allergy    Atrial fibrillation ([place])    Basal cell carcinoma of nose 08/12/2017   Carotid artery disease (HCC)    s/p L. CEA in July 2016   Centrilobular emphysema ([place]) 09/29/2017   Collagen vascular disease ([place])    Left carotid stenosis   COPD (chronic obstructive pulmonary disease) (HCC)    Coronary artery disease 09/29/2017   Noted on chest CT July 2019   Elevated blood pressure (not hypertension)    Fatty liver 09/29/2017   Chest CT July 2019   GERD (gastroesophageal reflux disease)    Hyperlipidemia    Personal history of tobacco use, presenting hazards to health 08/31/2015   PONV (postoperative nausea and vomiting)     Past Surgical History:  Procedure  Laterality Date   BREAST BIOPSY     ENDARTERECTOMY Left 08/25/2014   Procedure: ENDARTERECTOMY CAROTID;  Surgeon: Algernon Huxley, MD;  Location: [facility] ORS;  Service: Vascular;  Laterality: Left;   MOHS SURGERY     MOHS SURGERY  09/23/2017   nose   PERIPHERAL VASCULAR CATHETERIZATION N/A 07/20/2014   Procedure: Carotid Angiography;  Surgeon: Algernon Huxley, MD;  Location: [place] [facility];  Service: Cardiovascular;  Laterality: N/A;   TONSILLECTOMY     TUBAL LIGATION      Prior to Admission medications   Medication Sig Start Date End Date Taking? Authorizing Provider  albuterol (VENTOLIN HFA) 108 (90 Base) MCG/ACT inhaler Inhale 2 puffs into the lungs every 6 (six) hours as needed for wheezing or shortness of breath. 03/30/20  Yes Tyler Pita, MD  aspirin 81 MG tablet Take 81 mg by mouth daily.   Yes [provider]  atorvastatin (LIPITOR) 40 MG tablet Take 1 tablet (40 mg total) by mouth daily. 07/03/20  Yes Sowles, Drue Stager, MD  calcium carbonate (OS-CAL - DOSED IN MG OF ELEMENTAL CALCIUM) 1250 (500 Ca) MG tablet Take 1 tablet by mouth.   Yes [provider]  cholecalciferol (VITAMIN D) 1000 units tablet Take 1,000 Units by mouth daily.   Yes [provider]  Cyanocobalamin (VITAMIN B-12) 5000 MCG SUBL Place under the tongue.   Yes [provider]  ELIQUIS 5 MG TABS tablet TAKE 1 TABLET BY MOUTH TWICE A DAY 06/20/20  Yes Gollan, Kathlene November, MD  ezetimibe (ZETIA) 10 MG tablet Take 1 tablet (10 mg total) by mouth daily. 03/22/20  Yes Minna Merritts, MD  Fish Oil-Cholecalciferol (FISH OIL + D3 PO) Take 1,000 mg by mouth 1 day or 1 dose.   Yes [provider]  fluticasone (FLONASE) 50 MCG/ACT nasal spray Place 2 sprays into both nostrils daily. 07/19/20  Yes Sowles, Drue Stager, MD  metoprolol tartrate (LOPRESSOR) 25 MG tablet Take 0.5 tablets (12.5 mg total) by mouth 2 (two) times daily. 02/24/20  Yes Dunn, Areta Haber, PA-C  omeprazole (PRILOSEC) 40 MG capsule TAKE  1 CAPSULE (40 MG TOTAL) BY MOUTH AS NEEDED. 06/20/20  Yes Sowles, Drue Stager, MD  pregabalin (LYRICA) 50 MG capsule Take 1 capsule (50 mg total) by mouth 3 (three) times daily. 12/31/19  Yes Sowles, Drue Stager, MD  TRELEGY ELLIPTA 100-62.5-25 MCG/INH AEPB Take 1 puff by mouth daily. 03/13/20  Yes [provider]    Family History  Problem Relation Age of Onset   Heart attack Mother    Hypercholesterolemia Mother    Hypertension Mother    Peripheral vascular disease Mother    Dementia Mother    Hypothyroidism Mother    CVA Father    Liver cancer Father    Diabetes Brother    Kidney cancer Sister    Diabetes Brother    Alzheimer's disease Brother    Other Brother        alzheimers   Lymphoma Son    HIV Son    Breast cancer Neg Hx      Social History   Tobacco Use   Smoking status: Former    Packs/day: 1.00    Years: 55.00    Pack years: 55.00    Types: Cigarettes    Quit date: 11/26/2016    Years since quitting: 3.7   Smokeless tobacco: Former    Types: Snuff   Tobacco comments:    smoking cessation materials not required  Vaping Use   Vaping Use: Never used  Substance Use Topics   Alcohol use: No   Drug use: No    Allergies as of 08/15/2020 - Review Complete 08/15/2020  Allergen Reaction Noted   Morphine Nausea Only and Nausea And Vomiting 04/12/2015   Morphine and related Nausea And Vomiting 07/08/2014    Review of Systems:    All systems reviewed and negative except where noted in HPI.   Physical Exam:  BP 104/62 (BP Location: Left Arm, Patient Position: Sitting, Cuff Size: Large)   Pulse 76   Ht 5\' 11"  (1.803 m)   Wt 174 lb 3.2 oz (79 kg)   BMI 24.30 kg/m  No LMP recorded. Patient is postmenopausal. General:   Alert,  Well-developed, well-nourished, pleasant and cooperative in NAD Head:  Normocephalic and atraumatic. Eyes:  Sclera clear, no icterus.   Conjunctiva pink. Ears:  Normal auditory acuity. Neck:  Supple; no masses or thyromegaly. Lungs:   Respirations even and unlabored.  Clear throughout to auscultation.   No wheezes, crackles, or rhonchi. No acute distress. Heart:  Regular rate and rhythm; no murmurs, clicks, rubs, or gallops. Abdomen:  Normal bowel sounds.  No bruits.  Soft, non-tender and non-distended without masses, hepatosplenomegaly or hernias noted.  No guarding or rebound tenderness.  Negative Carnett sign.   Rectal:  Deferred.  Pulses:  Normal pulses noted. Extremities:  No clubbing or edema.  No  cyanosis. Neurologic:  Alert and oriented x3;  grossly normal neurologically. Skin:  Intact without significant lesions or rashes.  No jaundice. Lymph Nodes:  No significant cervical adenopathy. Psych:  Alert and cooperative. Normal mood and affect.  Imaging Studies: US Abdomen Complete  Result Date: 08/07/2020 CLINICAL DATA:  Abdominal distension, bloating, nausea and pain EXAM: ABDOMEN ULTRASOUND COMPLETE COMPARISON:  CT November 26, 2016 FINDINGS: Gallbladder: No gallstones or wall thickening visualized. No sonographic Murphy sign noted by sonographer. Common bile duct: Diameter: 3 mm Liver: No focal lesion identified. Diffusely increased parenchymal echogenicity with decreased acoustic penetration. Portal vein is patent on color Doppler imaging with normal direction of blood flow towards the liver. IVC: No abnormality visualized. Pancreas: Visualized portion unremarkable. Spleen: Size and appearance within normal limits. Right Kidney: Length: 10.1 cm. Echogenicity within normal limits. No mass or hydronephrosis visualized. Left Kidney: Length: 10.2 cm. Echogenicity within normal limits. No mass or hydronephrosis visualized. Abdominal aorta: No aneurysm visualized. Other findings: None. IMPRESSION: 1. The echogenicity of the liver is increased. This is a nonspecific finding but is most commonly seen with fatty infiltration of the liver. There are no obvious focal liver lesions. Electronically Signed   By: Dahlia Bailiff MD   On:  08/07/2020 15:51   VAS US CAROTID  Result Date: 07/21/2020 Carotid Arterial Duplex Study Patient Name:  Kristie Walters  Date of Exam:   07/21/2020 Medical Rec #: [id number]      Accession #:    [id number] Date of Birth: [birth year]      Patient Gender: F Patient Age:   71Y Exam Location:  [facility] Procedure:      VAS US CAROTID Referring Phys: [id number] [place] --------------------------------------------------------------------------------  Indications:   Left endarterectomy. Other Factors: History of left endarterectomy 08/25/2014. Performing Technologist: Concha Norway RVT  Examination Guidelines: A complete evaluation includes B-mode imaging, spectral Doppler, color Doppler, and power Doppler as needed of all accessible portions of each vessel. Bilateral testing is considered an integral part of a complete examination. Limited examinations for reoccurring indications may be performed as noted.  Right Carotid Findings: +----------+--------+--------+--------+------------------+--------+           PSV cm/sEDV cm/sStenosisPlaque DescriptionComments +----------+--------+--------+--------+------------------+--------+ CCA Prox  84      17                                         +----------+--------+--------+--------+------------------+--------+ CCA Mid   81      18                                         +----------+--------+--------+--------+------------------+--------+ CCA Distal72      19                                         +----------+--------+--------+--------+------------------+--------+ ICA Prox  37      12                                         +----------+--------+--------+--------+------------------+--------+ ICA Mid   56      16  1-39%   heterogenous               +----------+--------+--------+--------+------------------+--------+ ICA Distal51      14                                          +----------+--------+--------+--------+------------------+--------+ ECA       79      10                                         +----------+--------+--------+--------+------------------+--------+ +----------+--------+-------+----------------+-------------------+           PSV cm/sEDV cmsDescribe        Arm Pressure (mmHG) +----------+--------+-------+----------------+-------------------+ [id number]             Multiphasic, WNL                    +----------+--------+-------+----------------+-------------------+ +---------+--------+--+--------+---------+ VertebralPSV cm/s46EDV cm/sAntegrade +---------+--------+--+--------+---------+  Left Carotid Findings: +----------+--------+--------+--------+------------------+--------+           PSV cm/sEDV cm/sStenosisPlaque DescriptionComments +----------+--------+--------+--------+------------------+--------+ CCA Prox  71      16                                         +----------+--------+--------+--------+------------------+--------+ CCA Mid   98      23                                         +----------+--------+--------+--------+------------------+--------+ CCA Distal93      22                                         +----------+--------+--------+--------+------------------+--------+ ICA Prox  87      21      1-39%   heterogenous               +----------+--------+--------+--------+------------------+--------+ ICA Mid   83      29                                         +----------+--------+--------+--------+------------------+--------+ ICA Distal76      24                                         +----------+--------+--------+--------+------------------+--------+ ECA       80      7                                          +----------+--------+--------+--------+------------------+--------+ +----------+--------+--------+----------------+-------------------+           PSV cm/sEDV  cm/sDescribe        Arm Pressure (mmHG) +----------+--------+--------+----------------+-------------------+ [id number]             Multiphasic, WNL                    +----------+--------+--------+----------------+-------------------+ +---------+--------+--+--------+---------+  VertebralPSV cm/s54EDV cm/sAntegrade +---------+--------+--+--------+---------+   Summary: Right Carotid: Velocities in the right ICA are consistent with a 1-39% stenosis. Left Carotid: Velocities in the left ICA are consistent with a 1-39% stenosis. Vertebrals:  Bilateral vertebral arteries demonstrate antegrade flow. Subclavians: Normal flow hemodynamics were seen in bilateral subclavian              arteries. *See table(s) above for measurements and observations.  Electronically signed by Leotis Pain MD on 07/21/2020 at 10:55:11 AM.    Final     Assessment and Plan:   Kristie Walters is a 75 y.o. y/o female who comes in today with abdominal bloating and constipation.  The patient feels better after she moves her bowels.  The patient will be set up for colonoscopy to look for a cause for her change in bowel habits and abdominal bloating.  The patient has been told to avoid gassy foods and to avoid carbonated drinks.  She will also try and increase fiber so that she can help move stool more officially throughout her colon and pass the gas.  The patient has been in the plan and agrees with it.    Lucilla Lame, MD. Mar[facility] Regal    Note: This dictation was prepared with Dragon dictation along with smaller phrase technology. Any transcriptional errors that result from this process are unintentional.

## 2020-08-16 ENCOUNTER — Other Ambulatory Visit: Payer: Self-pay

## 2020-08-16 DIAGNOSIS — R140 Abdominal distension (gaseous): Secondary | ICD-10-CM

## 2020-08-16 DIAGNOSIS — R194 Change in bowel habit: Secondary | ICD-10-CM

## 2020-08-22 ENCOUNTER — Telehealth: Payer: Self-pay | Admitting: Pharmacist

## 2020-08-22 ENCOUNTER — Telehealth: Payer: Self-pay

## 2020-08-22 ENCOUNTER — Ambulatory Visit
Admission: RE | Admit: 2020-08-22 | Discharge: 2020-08-22 | Disposition: A | Discharge status: Home / Self Care | Payer: Medicare Other | Attending: Gastroenterology | Admitting: Gastroenterology

## 2020-08-22 ENCOUNTER — Encounter
Admission: RE | Disposition: A | Discharge status: Home / Self Care | Payer: Self-pay | Source: Home / Self Care | Attending: Gastroenterology

## 2020-08-22 ENCOUNTER — Ambulatory Visit: Payer: Medicare Other | Admitting: Anesthesiology

## 2020-08-22 ENCOUNTER — Other Ambulatory Visit: Payer: Self-pay

## 2020-08-22 ENCOUNTER — Encounter: Payer: Self-pay | Admitting: Gastroenterology

## 2020-08-22 DIAGNOSIS — R194 Change in bowel habit: Secondary | ICD-10-CM | POA: Diagnosis not present

## 2020-08-22 DIAGNOSIS — K635 Polyp of colon: Secondary | ICD-10-CM

## 2020-08-22 DIAGNOSIS — K5900 Constipation, unspecified: Secondary | ICD-10-CM | POA: Diagnosis not present

## 2020-08-22 DIAGNOSIS — K640 First degree hemorrhoids: Secondary | ICD-10-CM | POA: Diagnosis not present

## 2020-08-22 DIAGNOSIS — Z8051 Family history of malignant neoplasm of kidney: Secondary | ICD-10-CM | POA: Diagnosis not present

## 2020-08-22 DIAGNOSIS — D123 Benign neoplasm of transverse colon: Secondary | ICD-10-CM | POA: Insufficient documentation

## 2020-08-22 DIAGNOSIS — Z833 Family history of diabetes mellitus: Secondary | ICD-10-CM | POA: Insufficient documentation

## 2020-08-22 DIAGNOSIS — Z8249 Family history of ischemic heart disease and other diseases of the circulatory system: Secondary | ICD-10-CM | POA: Diagnosis not present

## 2020-08-22 DIAGNOSIS — R140 Abdominal distension (gaseous): Secondary | ICD-10-CM

## 2020-08-22 DIAGNOSIS — D125 Benign neoplasm of sigmoid colon: Secondary | ICD-10-CM | POA: Insufficient documentation

## 2020-08-22 DIAGNOSIS — K219 Gastro-esophageal reflux disease without esophagitis: Secondary | ICD-10-CM | POA: Diagnosis not present

## 2020-08-22 DIAGNOSIS — Z7982 Long term (current) use of aspirin: Secondary | ICD-10-CM | POA: Diagnosis not present

## 2020-08-22 DIAGNOSIS — Z807 Family history of other malignant neoplasms of lymphoid, hematopoietic and related tissues: Secondary | ICD-10-CM | POA: Insufficient documentation

## 2020-08-22 DIAGNOSIS — Z8349 Family history of other endocrine, nutritional and metabolic diseases: Secondary | ICD-10-CM | POA: Diagnosis not present

## 2020-08-22 DIAGNOSIS — K5730 Diverticulosis of large intestine without perforation or abscess without bleeding: Secondary | ICD-10-CM | POA: Insufficient documentation

## 2020-08-22 DIAGNOSIS — D124 Benign neoplasm of descending colon: Secondary | ICD-10-CM | POA: Diagnosis not present

## 2020-08-22 DIAGNOSIS — Z823 Family history of stroke: Secondary | ICD-10-CM | POA: Diagnosis not present

## 2020-08-22 DIAGNOSIS — Z79899 Other long term (current) drug therapy: Secondary | ICD-10-CM | POA: Diagnosis not present

## 2020-08-22 DIAGNOSIS — Z7951 Long term (current) use of inhaled steroids: Secondary | ICD-10-CM | POA: Insufficient documentation

## 2020-08-22 DIAGNOSIS — Z800 Family history of malignant neoplasm of digestive organs: Secondary | ICD-10-CM | POA: Insufficient documentation

## 2020-08-22 DIAGNOSIS — Z87891 Personal history of nicotine dependence: Secondary | ICD-10-CM | POA: Diagnosis not present

## 2020-08-22 DIAGNOSIS — J432 Centrilobular emphysema: Secondary | ICD-10-CM | POA: Insufficient documentation

## 2020-08-22 DIAGNOSIS — I4891 Unspecified atrial fibrillation: Secondary | ICD-10-CM | POA: Diagnosis not present

## 2020-08-22 DIAGNOSIS — Z885 Allergy status to narcotic agent status: Secondary | ICD-10-CM | POA: Insufficient documentation

## 2020-08-22 HISTORY — PX: COLONOSCOPY WITH PROPOFOL: SHX5780

## 2020-08-22 HISTORY — DX: Cardiac arrhythmia, unspecified: I49.9

## 2020-08-22 SURGERY — COLONOSCOPY WITH PROPOFOL
Anesthesia: General

## 2020-08-22 MED ORDER — PROPOFOL 10 MG/ML IV BOLUS
INTRAVENOUS | Status: DC | PRN
  Administered 2020-08-22: 70 mg via INTRAVENOUS

## 2020-08-22 MED ORDER — LIDOCAINE HCL (CARDIAC) PF 100 MG/5ML IV SOSY
PREFILLED_SYRINGE | INTRAVENOUS | Status: DC | PRN
  Administered 2020-08-22: 50 mg via INTRAVENOUS

## 2020-08-22 MED ORDER — SODIUM CHLORIDE 0.9 % IV SOLN: INTRAVENOUS | Status: DC

## 2020-08-22 MED ORDER — PROPOFOL 500 MG/50ML IV EMUL
INTRAVENOUS | Status: DC | PRN
  Administered 2020-08-22: 75 ug/kg/min via INTRAVENOUS

## 2020-08-22 MED ORDER — PHENYLEPHRINE HCL (PRESSORS) 10 MG/ML IV SOLN
INTRAVENOUS | Status: DC | PRN
  Administered 2020-08-22: 50 ug via INTRAVENOUS

## 2020-08-22 MED ORDER — PROPOFOL 10 MG/ML IV BOLUS
INTRAVENOUS | Status: AC
  Filled 2020-08-22: qty 20

## 2020-08-22 NOTE — Telephone Encounter (Signed)
-----   Message from Ramond Dial, [place] sent at 08/22/2020  7:19 AM EDT ----- Patients colonoscopy is scheduled for today, so it should be confirmed that she has help her Eliquis. She may hold 1-2 days prior to procedure. ----- Message ----- From: Minna Merritts, MD Sent: 08/21/2020   3:44 PM EDT To: Glennie Isle, CMA, [facility] Anticoag  We will CC anticoagulation clinic for assistance Thx Tgollan   ----- Message ----- From: Glennie Isle, CMA Sent: 08/15/2020   3:20 PM EDT To: Minna Merritts, MD

## 2020-08-22 NOTE — Telephone Encounter (Signed)
Patient is requesting hold for Eliquis prior to colonoscopy.  Pharmacy has reviewed her CHA2DS2-VASc score and provide recommendations.  However, we do not know the name of the surgeon, phone number, fax number, and other details surrounding procedure.  Please contact patient and ask if she can provide this information/details.

## 2020-08-22 NOTE — Anesthesia Preprocedure Evaluation (Addendum)
Anesthesia Evaluation  Patient identified by MRN, date of birth, ID band Patient awake    Reviewed: Allergy & Precautions, H&P , NPO status , Patient's Chart, lab work & pertinent test results  History of Anesthesia Complications (+) PONVNegative for: history of anesthetic complications  Airway Mallampati: III  TM Distance: >3 FB     Dental  (+) Upper Dentures   Pulmonary neg sleep apnea, COPD, former smoker,    Pulmonary exam normal        Cardiovascular (-) angina+ CAD  (-) Past MI and (-) Cardiac Stents Normal cardiovascular exam+ dysrhythmias Atrial Fibrillation      Neuro/Psych negative neurological ROS  negative psych ROS   GI/Hepatic Neg liver ROS, GERD  Controlled,  Endo/Other  negative endocrine ROS  Renal/GU negative Renal ROS  negative genitourinary   Musculoskeletal   Abdominal   Peds  Hematology negative hematology ROS (+)   Anesthesia Other Findings Past Medical History: No date: Allergy No date: Atrial fibrillation ([place]) 08/12/2017: Basal cell carcinoma of nose No date: Carotid artery disease ([place])     Comment:  s/p L. CEA in July 2016 09/29/2017: Centrilobular emphysema (HCC) No date: Collagen vascular disease ([place])     Comment:  Left carotid stenosis No date: COPD (chronic obstructive pulmonary disease) ([place]) 09/29/2017: Coronary artery disease     Comment:  Noted on chest CT July 2019 No date: Elevated blood pressure (not hypertension) 09/29/2017: Fatty liver     Comment:  Chest CT July 2019 No date: GERD (gastroesophageal reflux disease) No date: Hyperlipidemia 08/31/2015: Personal history of tobacco use, presenting hazards to  health No date: PONV (postoperative nausea and vomiting)  Past Surgical History: No date: BREAST BIOPSY 08/25/2014: ENDARTERECTOMY; Left     Comment:  Procedure: ENDARTERECTOMY CAROTID;  Surgeon: Algernon Huxley, MD;  Location: [facility] ORS;  Service:  Vascular;                Laterality: Left; No date: MOHS SURGERY 09/23/2017: MOHS SURGERY     Comment:  nose 07/20/2014: PERIPHERAL VASCULAR CATHETERIZATION; N/A     Comment:  Procedure: Carotid Angiography;  Surgeon: Algernon Huxley,               MD;  Location: [place] [facility];  Service:               Cardiovascular;  Laterality: N/A; No date: TONSILLECTOMY No date: TUBAL LIGATION     Reproductive/Obstetrics negative OB ROS                            Anesthesia Physical Anesthesia Plan  ASA: 3  Anesthesia Plan: General   Post-op Pain Management:    Induction:   PONV Risk Score and Plan: Propofol infusion and TIVA  Airway Management Planned:   Additional Equipment:   Intra-op Plan:   Post-operative Plan:   Informed Consent: I have reviewed the patients History and Physical, chart, labs and discussed the procedure including the risks, benefits and alternatives for the proposed anesthesia with the patient or authorized representative who has indicated his/her understanding and acceptance.     Dental Advisory Given  Plan Discussed with: Anesthesiologist, CRNA and Surgeon  Anesthesia Plan Comments:         Anesthesia Quick Evaluation

## 2020-08-22 NOTE — Telephone Encounter (Signed)
-----   Message from Minna Merritts, MD sent at 08/21/2020  3:43 PM EDT ----- We will CC anticoagulation clinic for assistance Thx Tgollan   ----- Message ----- From: Glennie Isle, [place] Sent: 08/15/2020   3:20 PM EDT To: Minna Merritts, MD

## 2020-08-22 NOTE — Telephone Encounter (Signed)
Patient with diagnosis of afib on Eliquis for anticoagulation.    Procedure: colonoscopy Date of procedure: 08/22/20  CHA2DS2-VASc Score = 4  This indicates a 4.8% annual risk of stroke. The patient's score is based upon: CHF History: No HTN History: No Diabetes History: No Stroke History: No Vascular Disease History: Yes Age Score: 2 Gender Score: 1     Per office protocol, patient can hold Eliquis for 1-2 days prior to procedure.    Procedure is today, therefore would confirm with patient that Eliquis has been held

## 2020-08-22 NOTE — Op Note (Signed)
[facility] Gastroenterology Patient Name: Kristie Walters Procedure Date: 08/22/2020 10:59 AM MRN: [id number] Account #: [id number] Date of Birth: [birth year] Admit Type: Outpatient Age: 75 Room: [facility] ENDO ROOM 4 Gender: Female Note Status: Finalized Procedure:             Colonoscopy Indications:           Constipation Providers:             Lucilla Lame MD, MD Referring MD:          Bethena Roys. Sowles, MD (Referring MD) Medicines:             Propofol per Anesthesia Complications:         No immediate complications. Procedure:             Pre-Anesthesia Assessment:                        - Prior to the procedure, a History and Physical was                         performed, and patient medications and allergies were                         reviewed. The patient's tolerance of previous                         anesthesia was also reviewed. The risks and benefits                         of the procedure and the sedation options and risks                         were discussed with the patient. All questions were                         answered, and informed consent was obtained. Prior                         Anticoagulants: The patient has taken no previous                         anticoagulant or antiplatelet agents. ASA Grade                         Assessment: II - A patient with mild systemic disease.                         After reviewing the risks and benefits, the patient                         was deemed in satisfactory condition to undergo the                         procedure.                        After obtaining informed consent, the colonoscope was  passed under direct vision. Throughout the procedure,                         the patient's blood pressure, pulse, and oxygen                         saturations were monitored continuously. The                         Colonoscope was introduced through the anus and                          advanced to the the cecum, identified by appendiceal                         orifice and ileocecal valve. The colonoscopy was                         performed without difficulty. The patient tolerated                         the procedure well. The quality of the bowel                         preparation was excellent. Findings:      The perianal and digital rectal examinations were normal.      Two sessile polyps were found in the transverse colon. The polyps were 3       to 4 mm in size. These polyps were removed with a cold biopsy forceps.       Resection and retrieval were complete.      A 5 mm polyp was found in the descending colon. The polyp was sessile.       The polyp was removed with a cold snare. Resection and retrieval were       complete.      Two sessile polyps were found in the sigmoid colon. The polyps were 2 to       5 mm in size. These polyps were removed with a cold snare. Resection and       retrieval were complete.      Multiple small-mouthed diverticula were found in the sigmoid colon.      Non-bleeding internal hemorrhoids were found during retroflexion. The       hemorrhoids were Grade I (internal hemorrhoids that do not prolapse). Impression:            - Two 3 to 4 mm polyps in the transverse colon,                         removed with a cold biopsy forceps. Resected and                         retrieved.                        - One 5 mm polyp in the descending colon, removed with                         a cold snare. Resected and retrieved.                        -  Two 2 to 5 mm polyps in the sigmoid colon, removed                         with a cold snare. Resected and retrieved.                        - Diverticulosis in the sigmoid colon.                        - Non-bleeding internal hemorrhoids. Recommendation:        - Discharge patient to home.                        - Resume previous diet.                        - Continue present medications.                         - Await pathology results.                        - Repeat colonoscopy is not recommended for                         surveillance. Procedure Code(s):     --- Professional ---                        [phone], Colonoscopy, flexible; with removal of                         tumor(s), polyp(s), or other lesion(s) by snare                         technique                        [id number], 59, Colonoscopy, flexible; with biopsy, single                         or multiple Diagnosis Code(s):     --- Professional ---                        K59.00, Constipation, unspecified                        K63.5, Polyp of colon CPT copyright 2019 American Medical Association. All rights reserved. The codes documented in this report are preliminary and upon coder review may  be revised to meet current compliance requirements. Lucilla Lame MD, MD 08/22/2020 11:18:41 AM This report has been signed electronically. Number of Addenda: 0 Note Initiated On: 08/22/2020 10:59 AM Scope Withdrawal Time: 0 hours 7 minutes 53 seconds  Total Procedure Duration: 0 hours 12 minutes 36 seconds  Estimated Blood Loss:  Estimated blood loss: none.      [facility]

## 2020-08-22 NOTE — Telephone Encounter (Signed)
Patient is currently admitted.  I will defer further recommendations to inpatient cardiology and pharmacy.  I will remove patient from CV DIV preop pool.

## 2020-08-22 NOTE — Telephone Encounter (Signed)
I was reviewing the chart to see if I may be able to find the name of the surgeon, the procedure being done; when I see that the pt is currently admitted to the hospital. I will update the pre op provider.

## 2020-08-22 NOTE — Telephone Encounter (Signed)
I d/w pre op provider Coletta Memos, FNP who states: Yeah, thanks. I not really sure why this one came to [place]. With her being admitted, everything is out of our hands. The hospital staff will handle this one.   I will remove this from the pre op call back pool.

## 2020-08-22 NOTE — H&P (Signed)
Lucilla Lame, MD [address] [place], [place] [address] [phone] Fax : [phone]  Primary Care Physician:  Steele Sizer, MD Primary Gastroenterologist:  Dr. Allen Norris  Pre-Procedure History & Physical: HPI:  Kristie Walters is a 75 y.o. female is here for an colonoscopy.   Past Medical History:  Diagnosis Date   Allergy    Atrial fibrillation ([place])    Basal cell carcinoma of nose 08/12/2017   Carotid artery disease (HCC)    s/p L. CEA in July 2016   Centrilobular emphysema ([place]) 09/29/2017   Collagen vascular disease ([place])    Left carotid stenosis   COPD (chronic obstructive pulmonary disease) (HCC)    Coronary artery disease 09/29/2017   Noted on chest CT July 2019   Dysrhythmia    Elevated blood pressure (not hypertension)    Fatty liver 09/29/2017   Chest CT July 2019   GERD (gastroesophageal reflux disease)    Hyperlipidemia    Personal history of tobacco use, presenting hazards to health 08/31/2015   PONV (postoperative nausea and vomiting)     Past Surgical History:  Procedure Laterality Date   BREAST BIOPSY     ENDARTERECTOMY Left 08/25/2014   Procedure: ENDARTERECTOMY CAROTID;  Surgeon: Algernon Huxley, MD;  Location: [facility] ORS;  Service: Vascular;  Laterality: Left;   MOHS SURGERY     MOHS SURGERY  09/23/2017   nose   PERIPHERAL VASCULAR CATHETERIZATION N/A 07/20/2014   Procedure: Carotid Angiography;  Surgeon: Algernon Huxley, MD;  Location: [facility];  Service: Cardiovascular;  Laterality: N/A;   TONSILLECTOMY     TUBAL LIGATION      Prior to Admission medications   Medication Sig Start Date End Date Taking? Authorizing Provider  aspirin 81 MG tablet Take 81 mg by mouth daily.   Yes [provider]  atorvastatin (LIPITOR) 40 MG tablet Take 1 tablet (40 mg total) by mouth daily. 07/03/20  Yes Sowles, Drue Stager, MD  ezetimibe (ZETIA) 10 MG tablet Take 1 tablet (10 mg total) by mouth daily. 03/22/20  Yes Minna Merritts, MD   metoprolol tartrate (LOPRESSOR) 25 MG tablet Take 0.5 tablets (12.5 mg total) by mouth 2 (two) times daily. 02/24/20  Yes Dunn, Areta Haber, PA-C  pregabalin (LYRICA) 50 MG capsule Take 1 capsule (50 mg total) by mouth 3 (three) times daily. 12/31/19  Yes Sowles, Drue Stager, MD  albuterol (VENTOLIN HFA) 108 (90 Base) MCG/ACT inhaler Inhale 2 puffs into the lungs every 6 (six) hours as needed for wheezing or shortness of breath. 03/30/20   Tyler Pita, MD  calcium carbonate (OS-CAL - DOSED IN MG OF ELEMENTAL CALCIUM) 1250 (500 Ca) MG tablet Take 1 tablet by mouth.    [provider]  cholecalciferol (VITAMIN D) 1000 units tablet Take 1,000 Units by mouth daily.    [provider]  Cyanocobalamin (VITAMIN B-12) 5000 MCG SUBL Place under the tongue.    [provider]  ELIQUIS 5 MG TABS tablet TAKE 1 TABLET BY MOUTH TWICE A DAY 06/20/20   Gollan, Kathlene November, MD  Fish Oil-Cholecalciferol (FISH OIL + D3 PO) Take 1,000 mg by mouth 1 day or 1 dose.    [provider]  fluticasone (FLONASE) 50 MCG/ACT nasal spray Place 2 sprays into both nostrils daily. 07/19/20   Steele Sizer, MD  Na Sulfate-K Sulfate-Mg Sulf (SUPREP BOWEL PREP KIT) 17.5-3.13-1.6 GM/177ML SOLN Take 1 kit by mouth as directed. 08/15/20   Lucilla Lame, MD  omeprazole (PRILOSEC) 40 MG capsule TAKE 1 CAPSULE (40 MG TOTAL) BY MOUTH AS NEEDED. 06/20/20   Ancil Boozer, Drue Stager, MD  TRELEGY ELLIPTA 100-62.5-25 MCG/INH AEPB Take 1 puff by mouth daily. 03/13/20   [provider]    Allergies as of 08/16/2020 - Review Complete 08/15/2020  Allergen Reaction Noted   Morphine Nausea Only and Nausea And Vomiting 04/12/2015   Morphine and related Nausea And Vomiting 07/08/2014    Family History  Problem Relation Age of Onset   Heart attack Mother    Hypercholesterolemia Mother    Hypertension Mother    Peripheral vascular disease Mother    Dementia Mother    Hypothyroidism Mother    CVA Father    Liver cancer  Father    Diabetes Brother    Kidney cancer Sister    Diabetes Brother    Alzheimer's disease Brother    Other Brother        Estate manager/land agent   Lymphoma Son    HIV Son    Breast cancer Neg Hx     Social History   Socioeconomic History   Marital status: Divorced    Spouse name: Not on file   Number of children: 2   Years of education: Not on file   Highest education level: 10th grade  Occupational History   Occupation: Retired  Tobacco Use   Smoking status: Former    Packs/day: 1.00    Years: 55.00    Pack years: 55.00    Types: Cigarettes    Quit date: 11/26/2016    Years since quitting: 3.7   Smokeless tobacco: Former    Types: Snuff   Tobacco comments:    smoking cessation materials not required  Vaping Use   Vaping Use: Never used  Substance and Sexual Activity   Alcohol use: No   Drug use: No   Sexual activity: Not Currently  Other Topics Concern   Not on file  Social History Narrative    Pt lives alone   Social Determinants of Health   Financial Resource Strain: Low Risk    Difficulty of Paying Living Expenses: Not hard at all  Food Insecurity: No Food Insecurity   Worried About Charity fundraiser in the Last Year: Never true   [place] in the Last Year: Never true  Transportation Needs: No Transportation Needs   Lack of Transportation (Medical): No   Lack of Transportation (Non-Medical): No  Physical Activity: Inactive   Days of Exercise per Week: 0 days   Minutes of Exercise per Session: 0 min  Stress: No Stress Concern Present   Feeling of Stress : Not at all  Social Connections: Moderately Isolated   Frequency of Communication with Friends and Family: More than three times a week   Frequency of Social Gatherings with Friends and Family: Three times a week   Attends Religious Services: More than 4 times per year   Active Member of Clubs or Organizations: No   Attends Archivist Meetings: Never   Marital Status: Divorced  Arboriculturist Violence: Not At Risk   Fear of Current or Ex-Partner: No   Emotionally Abused: No   Physically Abused: No   Sexually Abused: No    Review of Systems: See HPI, otherwise negative ROS  Physical Exam: BP 136/82   Pulse 70   Temp (!) 97.2 F (36.2 C) (Temporal)   Resp 18   Ht _0  (1.803 m)   Wt 79.4 kg  SpO2 98%   BMI 24.41 kg/m  General:   Alert,  pleasant and cooperative in NAD Head:  Normocephalic and atraumatic. Neck:  Supple; no masses or thyromegaly. Lungs:  Clear throughout to auscultation.    Heart:  Regular rate and rhythm. Abdomen:  Soft, nontender and nondistended. Normal bowel sounds, without guarding, and without rebound.   Neurologic:  Alert and  oriented x4;  grossly normal neurologically.  Impression/Plan: Deeann Saint is here for an colonoscopy to be performed for bloating and constipation   Risks, benefits, limitations, and alternatives regarding  colonoscopy have been reviewed with the patient.  Questions have been answered.  All parties agreeable.   Lucilla Lame, MD  08/22/2020, 10:53 AM

## 2020-08-22 NOTE — Transfer of Care (Signed)
Immediate Anesthesia Transfer of Care Note  Patient: Kristie Walters  Procedure(s) Performed: COLONOSCOPY WITH PROPOFOL  Patient Location: PACU  Anesthesia Type:General  Level of Consciousness: sedated  Airway & Oxygen Therapy: Patient Spontanous Breathing  Post-op Assessment: Report given to RN and Post -op Vital signs reviewed and stable  Post vital signs: Reviewed and stable  Last Vitals:  Vitals Value Taken Time  BP    Temp    Pulse    Resp    SpO2      Last Pain:  Vitals:   08/22/20 0947  TempSrc: Temporal  PainSc: 0-No pain         Complications: No notable events documented.

## 2020-08-23 ENCOUNTER — Encounter: Payer: Self-pay | Admitting: Gastroenterology

## 2020-08-24 LAB — SURGICAL PATHOLOGY

## 2020-08-26 ENCOUNTER — Encounter: Payer: Self-pay | Admitting: Gastroenterology

## 2020-09-29 ENCOUNTER — Ambulatory Visit (INDEPENDENT_AMBULATORY_CARE_PROVIDER_SITE_OTHER): Payer: Medicare Other | Admitting: Family Medicine

## 2020-09-29 ENCOUNTER — Other Ambulatory Visit: Payer: Self-pay

## 2020-09-29 DIAGNOSIS — H5712 Ocular pain, left eye: Secondary | ICD-10-CM | POA: Diagnosis not present

## 2020-09-29 DIAGNOSIS — H43813 Vitreous degeneration, bilateral: Secondary | ICD-10-CM | POA: Diagnosis not present

## 2020-09-29 DIAGNOSIS — R519 Headache, unspecified: Secondary | ICD-10-CM

## 2020-09-29 DIAGNOSIS — H2513 Age-related nuclear cataract, bilateral: Secondary | ICD-10-CM | POA: Diagnosis not present

## 2020-09-29 NOTE — Progress Notes (Signed)
Patient ID: Kristie Walters, female    DOB: 01/18/1946, 75 y.o.   MRN: [id number]  PCP: Steele Sizer, MD  Chief Complaint  Patient presents with   Eye Pain    Left eye pain   Facial Swelling    For 1 week    Subjective:   Kristie Walters is a 75 y.o. female, presents to clinic with CC of the following:  HPI  1-1.5 weeks of left eye pain and new HA's HA's pounding the top of her head Some associated nausea Left eye pain around it like someone is try ing to pull her eyeball out, pain worse with eye movement, she reports some swelling around her eye and cheek and eyelid and scant clear drainage from left eye No redness, purulent discharge, denies itching, allergies, nasal sx, earache.  No foreign body to eye, no trauma Reports it just all the sudden was there and severe one day, constant. 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   Fatty liver 09/29/2017   Coronary artery disease 09/29/2017   Centrilobular emphysema ([place]) 09/29/2017   Basal cell carcinoma of nose 08/12/2017   Estrogen deficiency 08/01/2017   Aortic atherosclerosis ([place]) 09/14/2015   Personal history of tobacco use, presenting hazards to health 08/31/2015   Paresthesia of foot, bilateral 08/15/2015   Breast mass, right 07/12/2015   Elevated alkaline phosphatase level 05/29/2015   COPD (chronic obstructive pulmonary disease) ([place]) 04/26/2015   GERD (gastroesophageal reflux disease) 04/26/2015   Hyperlipidemia 04/26/2015   Hyperglycemia 04/26/2015   Carotid stenosis 08/25/2014   H/O malignant neoplasm of skin 08/13/2012      Current Outpatient Medications:    albuterol (VENTOLIN HFA) 108 (90 Base) MCG/ACT inhaler, Inhale 2 puffs into the lungs every 6 (six) hours as needed for  wheezing or shortness of breath., Disp: 8 g, Rfl: 2   aspirin 81 MG tablet, Take 81 mg by mouth daily., Disp: , Rfl:    atorvastatin (LIPITOR) 40 MG tablet, Take 1 tablet (40 mg total) by mouth daily., Disp: 90 tablet, Rfl: 3   calcium carbonate (OS-CAL - DOSED IN MG OF ELEMENTAL CALCIUM) 1250 (500 Ca) MG tablet, Take 1 tablet by mouth., Disp: , Rfl:    cholecalciferol (VITAMIN D) 1000 units tablet, Take 1,000 Units by mouth daily., Disp: , Rfl:    Cyanocobalamin (VITAMIN B-12) 5000 MCG SUBL, Place under the tongue., Disp: , Rfl:    ELIQUIS 5 MG TABS tablet, TAKE 1 TABLET BY MOUTH TWICE A DAY, Disp: 180 tablet, Rfl: 1   ezetimibe (ZETIA) 10 MG tablet, Take 1 tablet (10 mg total) by mouth daily., Disp: 90 tablet, Rfl: 3   Fish Oil-Cholecalciferol (FISH OIL + D3 PO), Take 1,000 mg by mouth 1 day or 1 dose., Disp: , Rfl:    fluticasone (FLONASE) 50 MCG/ACT nasal spray, Place 2 sprays into both nostrils daily., Disp: 16 g, Rfl: 6   metoprolol tartrate (LOPRESSOR) 25 MG tablet, Take 0.5 tablets (12.5 mg total) by mouth 2 (two) times daily., Disp: 90 tablet, Rfl: 3   Na Sulfate-K Sulfate-Mg Sulf (SUPREP BOWEL PREP KIT) 17.5-3.13-1.6 GM/177ML SOLN, Take 1 kit by mouth as directed., Disp: 354 mL, Rfl: 0   omeprazole (PRILOSEC) 40 MG capsule, TAKE  1 CAPSULE (40 MG TOTAL) BY MOUTH AS NEEDED., Disp: 90 capsule, Rfl: 1   pregabalin (LYRICA) 50 MG capsule, Take 1 capsule (50 mg total) by mouth 3 (three) times daily., Disp: 270 capsule, Rfl: 1   TRELEGY ELLIPTA 100-62.5-25 MCG/INH AEPB, Take 1 puff by mouth daily., Disp: , Rfl:    Allergies  Allergen Reactions   Morphine Nausea Only and Nausea And Vomiting   Morphine And Related Nausea And Vomiting     Social History   Tobacco Use   Smoking status: Former    Packs/day: 1.00    Years: 55.00    Pack years: 55.00    Types: Cigarettes    Quit date: 11/26/2016    Years since quitting: 3.8   Smokeless tobacco: Former    Types: Snuff   Tobacco  comments:    smoking cessation materials not required  Vaping Use   Vaping Use: Never used  Substance Use Topics   Alcohol use: No   Drug use: No      Chart Review Today: I personally reviewed active problem list, medication list, allergies, family history, social history, health maintenance, notes from last encounter, lab results, imaging with the patient/caregiver today.   Review of Systems  Constitutional: Negative.  Negative for activity change, appetite change, chills, diaphoresis, fatigue and fever.  HENT: Negative.    Eyes:  Positive for pain and discharge. Negative for photophobia, redness, itching and visual disturbance.  Respiratory: Negative.    Cardiovascular: Negative.   Gastrointestinal: Negative.   Endocrine: Negative.   Genitourinary: Negative.   Musculoskeletal: Negative.   Skin: Negative.   Allergic/Immunologic: Negative.   Neurological:  Positive for headaches. Negative for dizziness, tremors, seizures, syncope, facial asymmetry, speech difficulty, weakness, light-headedness and numbness.  Hematological: Negative.   Psychiatric/Behavioral: Negative.    All other systems reviewed and are negative.     Objective:   Vitals:   09/29/20 1341  BP: 132/84  Pulse: 95  Resp: 16  Temp: 98.3 F (36.8 C)  TempSrc: Oral  SpO2: 96%  Weight: 170 lb (77.1 kg)  Height: '5\' 7"'  (1.702 m)    Body mass index is 26.63 kg/m.  Vision Screening   Right eye Left eye Both eyes  Without correction '20/50 20/50 20/50 '  With correction        Physical Exam Vitals and nursing note reviewed.  Constitutional:      General: She is not in acute distress.    Appearance: Normal appearance. She is not ill-appearing, toxic-appearing or diaphoretic.  HENT:     Head: Normocephalic and atraumatic. No right periorbital erythema or left periorbital erythema.     Jaw: There is normal jaw occlusion.     Comments: No ttp to temple of scalp    Right Ear: Tympanic membrane, ear canal  and external ear normal. There is no impacted cerumen.     Left Ear: Tympanic membrane, ear canal and external ear normal. There is no impacted cerumen.     Nose: Nose normal. No nasal deformity, nasal tenderness, mucosal edema, congestion or rhinorrhea.     Right Nostril: No septal hematoma or occlusion.     Left Nostril: No septal hematoma or occlusion.     Right Turbinates: Not enlarged, swollen or pale.     Left Turbinates: Not enlarged, swollen or pale.     Right Sinus: No maxillary sinus tenderness or frontal sinus tenderness.     Left Sinus: No maxillary sinus tenderness or frontal sinus tenderness.  Mouth/Throat:     Mouth: Mucous membranes are dry.     Pharynx: Oropharynx is clear. Uvula midline. No pharyngeal swelling, oropharyngeal exudate, posterior oropharyngeal erythema or uvula swelling.  Eyes:     General: No allergic shiner.       Right eye: No foreign body or discharge.        Left eye: No foreign body, discharge or hordeolum.     Extraocular Movements: Extraocular movements intact.     Right eye: Normal extraocular motion and no nystagmus.     Left eye: Normal extraocular motion and no nystagmus.     Conjunctiva/sclera:     Right eye: Right conjunctiva is not injected. No chemosis, exudate or hemorrhage.    Left eye: Left conjunctiva is injected (very mildly injected). No chemosis or hemorrhage.    Pupils: Pupils are equal, round, and reactive to light.     Comments: Very subtle left eyelid swelling w/o erythema Palpebral conjunctiva to left eye red, right eye pink Normal EOM's PERRLA  Cardiovascular:     Rate and Rhythm: Normal rate and regular rhythm.     Pulses: Normal pulses.     Heart sounds: Normal heart sounds.  Pulmonary:     Effort: Pulmonary effort is normal.     Breath sounds: Normal breath sounds.  Musculoskeletal:     Cervical back: Normal range of motion and neck supple. No rigidity.  Lymphadenopathy:     Cervical: No cervical adenopathy.   Skin:    Findings: No erythema, lesion or rash.  Neurological:     General: No focal deficit present.     Mental Status: She is alert.     Cranial Nerves: No cranial nerve deficit, dysarthria or facial asymmetry.     Sensory: Sensation is intact.     Motor: Motor function is intact. No tremor, abnormal muscle tone or pronator drift.     Gait: Gait abnormal (walks with cane).     Comments: LANG/SPEECH: Naming and repetition intact, fluent, no dysarthria, follows 3-step commands, answers questions appropriately   CRANIAL NERVES:   II: Pupils equal and reactive, no RAPD   III, IV, VI: EOM intact, no gaze preference or deviation, no nystagmus.   V: normal sensation in V1, V2, and V3 segments bilaterally   VII: no asymmetry, no nasolabial fold flattening   VIII: normal hearing to speech   IX, X: normal palatal elevation, no uvular deviation   XI: 5/5 head turn and 5/5 shoulder shrug bilaterally   XII: midline tongue protrusion  MOTOR:  5/5 bilateral grip strength  SENSORY:  Normal to light touch   COORD: Normal finger to nose and heel to shin, no tremor, no dysmetria  GAIT: walks with cane, baseline per pt    Psychiatric:        Mood and Affect: Mood normal.          Assessment & Plan:    1. Left eye pain Mild lid edema on exam, no signs of severe conjunctivitis or orbital cellulitis patient has good eye movement, visual screening was symmetrical between eyes she has a little bit of clear discharge and some erythema to the lower left palpebral conjunctive  but otherwise exam is fairly unremarkable She was referred over to [facility] for further evaluation Because there is pain with eye movement would want to rule out an abrasion and check intraocular pressure May also simply be allergies or sinusitis?   - Ambulatory referral to Ophthalmology  2. Intractable headache,  unspecified chronicity pattern, unspecified headache type She reports pounding headache across the  top of her head and behind her left eye which is new, VSS, pt with fairly unremarkable HENT exam and nonfocal neurological exam -may be related to etiology of eye pain, will need to follow for headaches if there are no significant findings or diagnoses for her eye pain after the eye specialist sees her today - Ambulatory referral to Ophthalmology     Delsa Grana, PA-C 09/29/20 1:56 PM

## 2020-11-02 NOTE — Anesthesia Postprocedure Evaluation (Signed)
Anesthesia Post Note  Patient: Kristie Walters  Procedure(s) Performed: COLONOSCOPY WITH PROPOFOL  Patient location during evaluation: PACU Anesthesia Type: General Level of consciousness: awake and alert Pain management: pain level controlled Vital Signs Assessment: post-procedure vital signs reviewed and stable Respiratory status: spontaneous breathing, nonlabored ventilation and respiratory function stable Cardiovascular status: blood pressure returned to baseline and stable Postop Assessment: no apparent nausea or vomiting Anesthetic complications: no   No notable events documented.   Last Vitals:  Vitals:   08/22/20 1119 08/22/20 1145  BP: 101/60 115/62  Pulse: 68   Resp: 14   Temp: (!) 36.1 C   SpO2: 98%     Last Pain:  Vitals:   08/23/20 0743  TempSrc:   PainSc: 0-No pain                 Tera Mater

## 2020-12-10 ENCOUNTER — Other Ambulatory Visit: Payer: Self-pay | Admitting: Cardiovascular Disease

## 2020-12-10 ENCOUNTER — Other Ambulatory Visit: Payer: Self-pay | Admitting: Family Medicine

## 2020-12-10 DIAGNOSIS — K219 Gastro-esophageal reflux disease without esophagitis: Secondary | ICD-10-CM

## 2020-12-11 NOTE — Telephone Encounter (Signed)
Appt made

## 2020-12-11 NOTE — Telephone Encounter (Signed)
Refill Request.  

## 2020-12-12 NOTE — Telephone Encounter (Signed)
Prescription refill request for Eliquis received. Last office visit:gollan 05/26/20 Scr:0.84 06/30/20 Age: 40f Weight:77.1kg

## 2020-12-19 ENCOUNTER — Other Ambulatory Visit: Payer: Self-pay | Admitting: Family Medicine

## 2020-12-19 DIAGNOSIS — G629 Polyneuropathy, unspecified: Secondary | ICD-10-CM

## 2020-12-19 NOTE — Telephone Encounter (Signed)
Requested medication (s) are due for refill today - yes  Requested medication (s) are on the active medication list -yes  Future visit scheduled -yes  Last refill: 12/31/19 #270 1RF  Notes to clinic: Request RF: non delegated Rx  Requested Prescriptions  Pending Prescriptions Disp Refills   pregabalin (LYRICA) 50 MG capsule [Pharmacy Med Name: PREGABALIN 50 MG CAPSULE] 270 capsule     Sig: TAKE 1 CAPSULE BY MOUTH 3 TIMES DAILY.     Not Delegated - Neurology:  Anticonvulsants - Controlled Failed - 12/19/2020  2:26 PM      Failed - This refill cannot be delegated      Passed - Valid encounter within last 12 months    Recent Outpatient Visits           2 months ago Left eye pain   [facility] Delsa Grana, PA-C   5 months ago Aortic atherosclerosis [facility])   [facility] Steele Sizer, MD   11 months ago Paroxysmal atrial fibrillation [facility])   [facility] Steele Sizer, MD   1 year ago Acute maxillary sinusitis, recurrence not specified   [place], MD   1 year ago Centrilobular emphysema [facility])   [facility] Steele Sizer, MD       Future Appointments             In 3 weeks Steele Sizer, MD [facility] [place]   In 7 months  [facility]               Requested Prescriptions  Pending Prescriptions Disp Refills   pregabalin (LYRICA) 50 MG capsule [Pharmacy Med Name: PREGABALIN 50 MG CAPSULE] 270 capsule     Sig: TAKE 1 CAPSULE BY MOUTH 3 TIMES DAILY.     Not Delegated - Neurology:  Anticonvulsants - Controlled Failed - 12/19/2020  2:26 PM      Failed - This refill cannot be delegated      Passed - Valid encounter within last 12 months    Recent Outpatient Visits           2 months ago Left eye pain   [facility] Delsa Grana, PA-C   5 months ago Aortic atherosclerosis  [facility])   [facility] Steele Sizer, MD   11 months ago Paroxysmal atrial fibrillation [facility])   [facility] Steele Sizer, MD   1 year ago Acute maxillary sinusitis, recurrence not specified   [facility] Towanda Malkin, MD   1 year ago Centrilobular emphysema [facility])   [facility] Steele Sizer, MD       Future Appointments             In 3 weeks Steele Sizer, MD [facility] [place]   In 7 months  [facility]

## 2021-01-02 ENCOUNTER — Encounter: Payer: Self-pay | Admitting: Adult Health

## 2021-01-02 ENCOUNTER — Ambulatory Visit: Payer: Medicare Other | Admitting: Adult Health

## 2021-01-02 ENCOUNTER — Ambulatory Visit (INDEPENDENT_AMBULATORY_CARE_PROVIDER_SITE_OTHER): Payer: Medicare Other | Admitting: Adult Health

## 2021-01-02 ENCOUNTER — Other Ambulatory Visit: Payer: Self-pay

## 2021-01-02 VITALS — BP 130/60 | HR 70 | Ht 71.0 in | Wt 172.2 lb

## 2021-01-02 DIAGNOSIS — J449 Chronic obstructive pulmonary disease, unspecified: Secondary | ICD-10-CM

## 2021-01-02 DIAGNOSIS — Z87891 Personal history of nicotine dependence: Secondary | ICD-10-CM

## 2021-01-02 NOTE — Progress Notes (Signed)
'@Patient'  ID: Kristie Walters, female    DOB: 12-23-1945, 75 y.o.   MRN: [id number]  Chief Complaint  Patient presents with   Follow-up    Referring provider: Steele Sizer, MD  HPI: 75 year old female former smoker (quit 2018-55-pack-year history) followed for COPD with emphysema  TEST/EVENTS :  03/26/2019 2D echo: G2 DD, mild elevation of pulmonary artery systolic pressure, LVEF 55 to 60% 09/16/2019 low-dose lung cancer screening CT: Multiple small inflammatory groundglass nodules, mild diffuse bronchial wall thickening with mild centrilobular and paraseptal emphysema, no evidence of fibrosis  PFTs March 28, 2020 FEV1 63%, ratio 82, FVC 58%, no significant bronchodilator response, DLCO 49%  01/02/2021 Follow up : COPD with Emphysema  Patient returns for a 40-monthfollow-up.  Patient has underlying moderate COPD with emphysema.  She is maintained on Trelegy inhaler.  Patient says overall breathing is doing okay. Has good and bad days. Gets winded with heavy activities.  Activity level is at baseline. Lives at home alone. Drives short distances. Has a life alert.  Patient has a heavy smoking history.  She participates in the low-dose CT screening program.  Last CT was September 16, 2019 with a lung RADS 2S benign appearance.  Recommendations for follow-up CT chest in 1 year. Says she has not been scheduled , we discussed getting back on schedule.  No flare in cough or wheezing. No recent antibiotics or steroids.  Remains on Trelegy  daily .  Under a lot of stress. Son has Lymphoma undergoing chemo. Going to have Stem cell transplant (from daughter).   Flu shot utd. Covid vaccine x 5 utd.  Pneumovax utd.   Allergies  Allergen Reactions   Morphine Nausea Only and Nausea And Vomiting   Morphine And Related Nausea And Vomiting    Immunization History  Administered Date(s) Administered   Influenza, High Dose Seasonal PF 10/08/2016, 10/01/2018, 10/08/2019, 11/01/2020    Influenza-Unspecified 11/19/2014, 11/16/2015, 10/08/2016, 09/18/2017   Moderna Covid-19 Vaccine Bivalent Booster 178yr& up 11/15/2020   Moderna Sars-Covid-2 Vaccination 01/05/2020, 07/27/2020   PFIZER(Purple Top)SARS-COV-2 Vaccination 04/21/2019, 05/12/2019   Pneumococcal Conjugate-13 11/18/2016   Pneumococcal Polysaccharide-23 06/11/2012, 06/01/2020   Td 02/18/2009    Past Medical History:  Diagnosis Date   Allergy    Atrial fibrillation ([facility]   Basal cell carcinoma of nose 08/12/2017   Carotid artery disease (HCC)    s/p L. CEA in July 2016   Centrilobular emphysema ([facility]08/01/2018   Collagen vascular disease ([facility]   Left carotid stenosis   COPD (chronic obstructive pulmonary disease) ([facility]   Coronary artery disease 09/29/2017   Noted on chest CT July 2019   Dysrhythmia    Elevated blood pressure (not hypertension)    Fatty liver 09/29/2017   Chest CT July 2019   GERD (gastroesophageal reflux disease)    Hyperlipidemia    Personal history of tobacco use, presenting hazards to health 08/31/2015   PONV (postoperative nausea and vomiting)     Tobacco History: Social History   Tobacco Use  Smoking Status Former   Packs/day: 1.00   Years: 55.00   Pack years: 55.00   Types: Cigarettes   Quit date: 11/26/2016   Years since quitting: 4.1  Smokeless Tobacco Former   Types: Snuff  Tobacco Comments   smoking cessation materials not required   Counseling given: Not Answered Tobacco comments: smoking cessation materials not required   Outpatient Medications Prior to Visit  Medication Sig Dispense Refill   albuterol (VENTOLIN HFA)  108 (90 Base) MCG/ACT inhaler Inhale 2 puffs into the lungs every 6 (six) hours as needed for wheezing or shortness of breath. 8 g 2   aspirin 81 MG tablet Take 81 mg by mouth daily.     atorvastatin (LIPITOR) 40 MG tablet Take 1 tablet (40 mg total) by mouth daily. 90 tablet 3   calcium carbonate (OS-CAL - DOSED IN MG OF ELEMENTAL CALCIUM)  1250 (500 Ca) MG tablet Take 1 tablet by mouth.     cholecalciferol (VITAMIN D) 1000 units tablet Take 1,000 Units by mouth daily.     Cyanocobalamin (VITAMIN B-12) 5000 MCG SUBL Place under the tongue.     ELIQUIS 5 MG TABS tablet TAKE 1 TABLET BY MOUTH TWICE A DAY 180 tablet 1   ezetimibe (ZETIA) 10 MG tablet Take 1 tablet (10 mg total) by mouth daily. 90 tablet 3   Fish Oil-Cholecalciferol (FISH OIL + D3 PO) Take 1,000 mg by mouth 1 day or 1 dose.     fluticasone (FLONASE) 50 MCG/ACT nasal spray Place 2 sprays into both nostrils daily. (Patient taking differently: Place 2 sprays into both nostrils daily. Patient states she takes as needed) 16 g 6   metoprolol tartrate (LOPRESSOR) 25 MG tablet Take 0.5 tablets (12.5 mg total) by mouth 2 (two) times daily. 90 tablet 3   omeprazole (PRILOSEC) 40 MG capsule TAKE 1 CAPSULE (40 MG TOTAL) BY MOUTH AS NEEDED. 90 capsule 0   TRELEGY ELLIPTA 100-62.5-25 MCG/INH AEPB Take 1 puff by mouth daily.     pregabalin (LYRICA) 50 MG capsule Take 1 capsule (50 mg total) by mouth 3 (three) times daily. (Patient not taking: Reported on 01/02/2021) 270 capsule 1   Na Sulfate-K Sulfate-Mg Sulf (SUPREP BOWEL PREP KIT) 17.5-3.13-1.6 GM/177ML SOLN Take 1 kit by mouth as directed. 354 mL 0   No facility-administered medications prior to visit.     Review of Systems:   Constitutional:   No  weight loss, night sweats,  Fevers, chills,  +fatigue, or  lassitude.  HEENT:   No headaches,  Difficulty swallowing,  Tooth/dental problems, or  Sore throat,                No sneezing, itching, ear ache, nasal congestion, post nasal drip,   CV:  No chest pain,  Orthopnea, PND, swelling in lower extremities, anasarca, dizziness, palpitations, syncope.   GI  No heartburn, indigestion, abdominal pain, nausea, vomiting, diarrhea, change in bowel habits, loss of appetite, bloody stools.   Resp:   No excess mucus, no productive cough,  No non-productive cough,  No coughing up of  blood.  No change in color of mucus.  No wheezing.  No chest wall deformity  Skin: no rash or lesions.  GU: no dysuria, change in color of urine, no urgency or frequency.  No flank pain, no hematuria   MS:  No joint pain or swelling.  No decreased range of motion.  No back pain.    Physical Exam  BP 130/60 (BP Location: Left Arm, Patient Position: Sitting, Cuff Size: Large)   Pulse 70   Ht '5\' 11"'  (1.803 m)   Wt 172 lb 3.2 oz (78.1 kg)   SpO2 96%   BMI 24.02 kg/m   GEN: A/Ox3; pleasant , NAD, well nourished    HEENT:  [place]/AT,   NOSE-clear, THROAT-clear, no lesions, no postnasal drip or exudate noted.   NECK:  Supple w/ fair ROM; no JVD; normal carotid impulses w/o bruits; no  thyromegaly or nodules palpated; no lymphadenopathy.    RESP  Clear  P & A; w/o, wheezes/ rales/ or rhonchi. no accessory muscle use, no dullness to percussion  CARD:  RRR, no m/r/g, no peripheral edema, pulses intact, no cyanosis or clubbing.  GI:   Soft & nt; nml bowel sounds; no organomegaly or masses detected.   Musco: Warm bil, no deformities or joint swelling noted.   Neuro: alert, no focal deficits noted.    Skin: Warm, no lesions or rashes    Lab Results:  CBC    Component Value Date/Time   WBC 8.2 06/30/2020 1043   RBC 4.73 06/30/2020 1043   HGB 13.6 06/30/2020 1043   HGB 13.4 02/24/2020 1113   HCT 41.3 06/30/2020 1043   HCT 39.0 02/24/2020 1113   PLT 239 06/30/2020 1043   PLT 236 02/24/2020 1113   MCV 87.3 06/30/2020 1043   MCV 86 02/24/2020 1113   MCV 89 02/20/2014 1016   MCH 28.8 06/30/2020 1043   MCHC 32.9 06/30/2020 1043   RDW 13.3 06/30/2020 1043   RDW 13.1 02/24/2020 1113   RDW 13.3 02/20/2014 1016   LYMPHSABS 2,157 06/30/2020 1043   LYMPHSABS 1.9 03/09/2019 1053   MONOABS 0.6 11/29/2016 0321   EOSABS 238 06/30/2020 1043   EOSABS 0.1 03/09/2019 1053   BASOSABS 90 06/30/2020 1043   BASOSABS 0.1 03/09/2019 1053    BMET    Component Value Date/Time   NA 142  06/30/2020 1043   NA 143 02/24/2020 1113   NA 141 02/20/2014 1016   K 5.1 06/30/2020 1043   K 4.0 02/20/2014 1016   CL 107 06/30/2020 1043   CL 106 02/20/2014 1016   CO2 26 06/30/2020 1043   CO2 31 02/20/2014 1016   GLUCOSE 97 06/30/2020 1043   GLUCOSE 92 02/20/2014 1016   BUN 12 06/30/2020 1043   BUN 14 02/24/2020 1113   BUN 11 02/20/2014 1016   CREATININE 0.84 06/30/2020 1043   CALCIUM 9.4 06/30/2020 1043   CALCIUM 9.2 02/20/2014 1016   GFRNONAA 68 06/30/2020 1043   GFRAA 79 06/30/2020 1043    BNP No results found for: BNP  ProBNP No results found for: PROBNP  Imaging: No results found.    PFT Results Latest Ref Rng & Units 03/28/2020  FVC-Pre L 2.06  FVC-Predicted Pre % 54  FVC-Post L 2.20  FVC-Predicted Post % 58  Pre FEV1/FVC % % 82  Post FEV1/FCV % % 82  FEV1-Pre L 1.68  FEV1-Predicted Pre % 59  FEV1-Post L 1.80  DLCO uncorrected ml/min/mmHg 11.94  DLCO UNC% % 49  DLVA Predicted % 92  TLC L 4.15  TLC % Predicted % 68  RV % Predicted % 70    No results found for: NITRICOXIDE      Assessment & Plan:   COPD (chronic obstructive pulmonary disease) (HCC) COPD with emphysema-currently compensated on present regimen.  No changes.  Continue with the low-dose CT screening program.  Plan  Patient Instructions  Continue on TRELEGY 1 puff daily , rinse after use.  Albuterol inhaler As needed   Activity as tolerated Referral to LDCT screening program .  Follow up with Dr. Patsey Berthold in 6 months and As needed         Rexene Edison, NP 01/02/2021

## 2021-01-02 NOTE — Assessment & Plan Note (Signed)
COPD with emphysema-currently compensated on present regimen.  No changes.  Continue with the low-dose CT screening program.  Plan  Patient Instructions  Continue on TRELEGY 1 puff daily , rinse after use.  Albuterol inhaler As needed   Activity as tolerated Referral to LDCT screening program .  Follow up with Dr. Patsey Berthold in 6 months and As needed

## 2021-01-02 NOTE — Addendum Note (Signed)
Addended by: Elby Beck R on: 01/02/2021 01:37 PM   Modules accepted: Orders

## 2021-01-02 NOTE — Patient Instructions (Addendum)
Continue on TRELEGY 1 puff daily , rinse after use.  Albuterol inhaler As needed   Activity as tolerated Referral to LDCT screening program .  Follow up with Dr. Patsey Berthold in 6 months and As needed

## 2021-01-03 NOTE — Progress Notes (Signed)
Agree with the details of the visit as noted by Tammy Parrett, NP.  C. Laura Jaylee Lantry, MD [facility] 

## 2021-01-10 NOTE — Progress Notes (Signed)
Name: Kristie Walters   MRN: [id number]    DOB: September 09, 1945   Date:01/15/2021       Progress Note  Subjective  Chief Complaint  Follow Up  HPI   Abdominal distention/ Bloating: Patient had colonoscopy in 08/22/2020 and states they found 5 polyps and was advised to follow high fiber diet to help with bloating. She states she is still bloated and expresses concerns of lower abdomen (bilateral lower quadrants). State fiber supplements are helping with bowel movements but bloating and distention are still present. States bloating is more uncomfortable than painful. States she is eating more "normal" again and reports "I'm a healthy eater".   Urinary discomfort: patient states she is having mild, intermittent stinging, itching, and incomplete voiding. She also voices concerns over increased vaginal discharge. This has been ongoing for several weeks.    COPD: she recently went to pulmonologist and is continuing with Trelegy. She states it makes her hoarse. She states she is wheezing at night when she lays down to go to bed. States pulm did not change her regimen with news of wheezing at night.   Paroxysmal Afib.: has follow up with cardiology at the beginning of new year. Still taking medications as prescribed. No side effects or episodes of a. Fib to report today.   Peripheral Neuropathy: states her feet and legs still bother her at night but not too bad. States she would like a refill for Lyrica today.   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   Fatty liver 09/29/2017   Coronary artery disease 09/29/2017   Centrilobular emphysema ([place]) 09/29/2017   Basal cell carcinoma of nose 08/12/2017   Estrogen  deficiency 08/01/2017   Aortic atherosclerosis ([place]) 09/14/2015   Personal history of tobacco use, presenting hazards to health 08/31/2015   Paresthesia of foot, bilateral 08/15/2015   Breast mass, right 07/12/2015   Elevated alkaline phosphatase level 05/29/2015   COPD (chronic obstructive pulmonary disease) ([place]) 04/26/2015   GERD (gastroesophageal reflux disease) 04/26/2015   Hyperlipidemia 04/26/2015   Hyperglycemia 04/26/2015   Carotid stenosis 08/25/2014   H/O malignant neoplasm of skin 08/13/2012    Past Surgical History:  Procedure Laterality Date   BREAST BIOPSY     COLONOSCOPY WITH PROPOFOL N/A 08/22/2020   Procedure: COLONOSCOPY WITH PROPOFOL;  Surgeon: Lucilla Lame, MD;  Location: [facility] ENDOSCOPY;  Service: Endoscopy;  Laterality: N/A;   ENDARTERECTOMY Left 08/25/2014   Procedure: ENDARTERECTOMY CAROTID;  Surgeon: Algernon Huxley, MD;  Location: [facility] ORS;  Service: Vascular;  Laterality: Left;   MOHS SURGERY     MOHS SURGERY  09/23/2017   nose   PERIPHERAL VASCULAR CATHETERIZATION N/A 07/20/2014   Procedure: Carotid Angiography;  Surgeon: Algernon Huxley, MD;  Location: [place] [facility];  Service: Cardiovascular;  Laterality: N/A;   TONSILLECTOMY     TUBAL LIGATION      Family History  Problem Relation Age of Onset   Heart attack Mother    Hypercholesterolemia Mother    Hypertension Mother    Peripheral vascular disease Mother    Dementia Mother    Hypothyroidism Mother    CVA Father    Liver cancer Father    Diabetes  Brother    Kidney cancer Sister    Diabetes Brother    Alzheimer's disease Brother    Other Brother        alzheimers   Lymphoma Son    HIV Son    Breast cancer Neg Hx     Social History   Tobacco Use   Smoking status: Former    Packs/day: 1.00    Years: 55.00    Pack years: 55.00    Types: Cigarettes    Quit date: 11/26/2016    Years since quitting: 4.1   Smokeless tobacco: Former    Types: Snuff   Tobacco comments:    smoking cessation  materials not required  Substance Use Topics   Alcohol use: No     Current Outpatient Medications:    albuterol (VENTOLIN HFA) 108 (90 Base) MCG/ACT inhaler, Inhale 2 puffs into the lungs every 6 (six) hours as needed for wheezing or shortness of breath., Disp: 8 g, Rfl: 2   aspirin 81 MG tablet, Take 81 mg by mouth daily., Disp: , Rfl:    atorvastatin (LIPITOR) 40 MG tablet, Take 1 tablet (40 mg total) by mouth daily., Disp: 90 tablet, Rfl: 3   calcium carbonate (OS-CAL - DOSED IN MG OF ELEMENTAL CALCIUM) 1250 (500 Ca) MG tablet, Take 1 tablet by mouth., Disp: , Rfl:    cholecalciferol (VITAMIN D) 1000 units tablet, Take 1,000 Units by mouth daily., Disp: , Rfl:    Cyanocobalamin (VITAMIN B-12) 5000 MCG SUBL, Place under the tongue., Disp: , Rfl:    ELIQUIS 5 MG TABS tablet, TAKE 1 TABLET BY MOUTH TWICE A DAY, Disp: 180 tablet, Rfl: 1   ezetimibe (ZETIA) 10 MG tablet, Take 1 tablet (10 mg total) by mouth daily., Disp: 90 tablet, Rfl: 3   Fish Oil-Cholecalciferol (FISH OIL + D3 PO), Take 1,000 mg by mouth 1 day or 1 dose., Disp: , Rfl:    fluticasone (FLONASE) 50 MCG/ACT nasal spray, Place 2 sprays into both nostrils daily. (Patient taking differently: Place 2 sprays into both nostrils daily. Patient states she takes as needed), Disp: 16 g, Rfl: 6   metoprolol tartrate (LOPRESSOR) 25 MG tablet, Take 0.5 tablets (12.5 mg total) by mouth 2 (two) times daily., Disp: 90 tablet, Rfl: 3   omeprazole (PRILOSEC) 40 MG capsule, TAKE 1 CAPSULE (40 MG TOTAL) BY MOUTH AS NEEDED., Disp: 90 capsule, Rfl: 0   TRELEGY ELLIPTA 100-62.5-25 MCG/INH AEPB, Take 1 puff by mouth daily., Disp: , Rfl:    pregabalin (LYRICA) 50 MG capsule, Take 1 capsule (50 mg total) by mouth 3 (three) times daily. (Patient not taking: Reported on 01/15/2021), Disp: 270 capsule, Rfl: 1  Allergies  Allergen Reactions   Morphine Nausea Only and Nausea And Vomiting   Morphine And Related Nausea And Vomiting    I personally reviewed  active problem list, medication list, allergies, family history, social history, health maintenance with the patient/caregiver today.   Review of Systems  Respiratory:  Positive for wheezing.   Cardiovascular:  Negative for chest pain and palpitations.  Gastrointestinal:        Bloating and abdominal distention  Genitourinary:  Positive for dysuria. Negative for hematuria.  Neurological:  Positive for tingling.  Psychiatric/Behavioral:  Negative for depression. The patient is not nervous/anxious.      Objective  Vitals:   01/15/21 1357  BP: 126/72  Pulse: 86  Resp: 16  Temp: 98.2 F (36.8 C)  SpO2: 95%  Weight: 170 lb (77.1  kg)  Height: 5\' 11"  (1.803 m)    Body mass index is 23.71 kg/m.  Physical Exam Cardiovascular:     Rate and Rhythm: Normal rate and regular rhythm.     Heart sounds: Normal heart sounds.  Pulmonary:     Effort: Pulmonary effort is normal.     Breath sounds: Normal breath sounds. Decreased air movement present.  Abdominal:     General: Bowel sounds are normal. There is distension.     Tenderness: There is abdominal tenderness in the right lower quadrant.     Recent Results (from the past 2160 hour(s))  POCT Urinalysis Dipstick     Status: None   Collection Time: 01/15/21  2:49 PM  Result Value Ref Range   Color, UA Yellow    Clarity, UA Clear    Glucose, UA Negative Negative   Bilirubin, UA Negative    Ketones, UA Negative    Spec Grav, UA 1.015 1.010 - 1.025   Blood, UA Negative    pH, UA 6.0 5.0 - 8.0   Protein, UA Negative Negative   Urobilinogen, UA 0.2 0.2 or 1.0 E.U./dL   Nitrite, UA Negative    Leukocytes, UA Negative Negative   Appearance     Odor        PHQ2/9: Depression screen [facility] 2/9 01/15/2021 09/29/2020 08/08/2020 06/30/2020 12/31/2019  Decreased Interest 0 0 0 0 1  Down, Depressed, Hopeless 0 0 0 0 0  PHQ - 2 Score 0 0 0 0 1  Altered sleeping 1 - - - 1  Tired, decreased energy 1 - - - 1  Change in appetite 0 - - -  0  Feeling bad or failure about yourself  0 - - - 0  Trouble concentrating 0 - - - 0  Moving slowly or fidgety/restless 0 - - - 0  Suicidal thoughts 0 - - - 0  PHQ-9 Score 2 - - - 3  Difficult doing work/chores - - - - Not difficult at all  Some recent data might be hidden    phq 9 is negative   Fall Risk: Fall Risk  01/15/2021 09/29/2020 08/08/2020 06/30/2020 12/31/2019  Falls in the past year? 0 0 0 0 0  Comment - - - - -  Number falls in past yr: 0 0 0 0 0  Injury with Fall? 0 0 0 0 0  Risk for fall due to : No Fall Risks - No Fall Risks - -  Risk for fall due to: Comment - - - - -  Follow up Falls prevention discussed Falls evaluation completed Falls prevention discussed - -      Functional Status Survey: Is the patient deaf or have difficulty hearing?: Yes Does the patient have difficulty seeing, even when wearing glasses/contacts?: No Does the patient have difficulty concentrating, remembering, or making decisions?: No Does the patient have difficulty walking or climbing stairs?: Yes Does the patient have difficulty dressing or bathing?: No Does the patient have difficulty doing errands alone such as visiting a doctor's office or shopping?: No    Assessment & Plan  Problem List Items Addressed This Visit       Respiratory   COPD (chronic obstructive pulmonary disease) ([place])    Reviewed Trelegy administration to assist with hoarseness Continue Trelegy inhaler as prescribed.  Follow up in 3-6 months.         Other   Abdominal distention    CT of abdomen ordered  Follow up after CT  scan is complete Continue high fiber diet as recommended by GI.       Right lower quadrant abdominal pain    CT scan of abdomen ordered today.  Will follow up after scan is completed.       Dysuria - Primary    Vaginal swab to check for Bacterial vaginosis and yeast infection collected today  Urine dipstick and urine culture ordered as well.  Results to dictate further course  of action.         Relevant Orders   Cervicovaginal ancillary only   CULTURE, URINE COMPREHENSIVE   POCT Urinalysis Dipstick (Completed)   Other Visit Diagnoses     Need for shingles vaccine          1. Dysuria  - Cervicovaginal ancillary only - CULTURE, URINE COMPREHENSIVE - POCT Urinalysis Dipstick  2. Chronic obstructive pulmonary disease, unspecified COPD type ([place])   3. Need for shingles vaccine  She will check with insurance  4. Abdominal distention  - CT Abdomen Pelvis Wo Contrast; Future  5. Right lower quadrant abdominal pain  - CT Abdomen Pelvis Wo Contrast; Future  6. Aortic atherosclerosis (HCC)   Continue statin therapy   7. Neuropathy  - pregabalin (LYRICA) 50 MG capsule; Take 1 capsule (50 mg total) by mouth 3 (three) times daily.  Dispense: 270 capsule; Refill: 1

## 2021-01-15 ENCOUNTER — Other Ambulatory Visit: Payer: Self-pay

## 2021-01-15 ENCOUNTER — Ambulatory Visit (INDEPENDENT_AMBULATORY_CARE_PROVIDER_SITE_OTHER): Payer: Medicare Other | Admitting: Family Medicine

## 2021-01-15 ENCOUNTER — Encounter: Payer: Self-pay | Admitting: Family Medicine

## 2021-01-15 ENCOUNTER — Other Ambulatory Visit (HOSPITAL_COMMUNITY)
Admission: RE | Admit: 2021-01-15 | Discharge: 2021-01-15 | Disposition: A | Discharge status: Home / Self Care | Payer: Medicare Other | Source: Ambulatory Visit | Attending: Family Medicine | Admitting: Family Medicine

## 2021-01-15 VITALS — BP 126/72 | HR 86 | Temp 98.2°F | Resp 16 | Ht 71.0 in | Wt 170.0 lb

## 2021-01-15 DIAGNOSIS — J449 Chronic obstructive pulmonary disease, unspecified: Secondary | ICD-10-CM | POA: Diagnosis not present

## 2021-01-15 DIAGNOSIS — G629 Polyneuropathy, unspecified: Secondary | ICD-10-CM

## 2021-01-15 DIAGNOSIS — I700 Atherosclerosis of aorta: Secondary | ICD-10-CM | POA: Diagnosis not present

## 2021-01-15 DIAGNOSIS — R140 Abdominal distension (gaseous): Secondary | ICD-10-CM | POA: Insufficient documentation

## 2021-01-15 DIAGNOSIS — R300 Dysuria: Secondary | ICD-10-CM | POA: Insufficient documentation

## 2021-01-15 DIAGNOSIS — R1031 Right lower quadrant pain: Secondary | ICD-10-CM | POA: Insufficient documentation

## 2021-01-15 DIAGNOSIS — Z23 Encounter for immunization: Secondary | ICD-10-CM

## 2021-01-15 LAB — POCT URINALYSIS DIPSTICK
Bilirubin, UA: NEGATIVE
Blood, UA: NEGATIVE
Glucose, UA: NEGATIVE
Ketones, UA: NEGATIVE
Leukocytes, UA: NEGATIVE
Nitrite, UA: NEGATIVE
Protein, UA: NEGATIVE
Spec Grav, UA: 1.015 (ref 1.010–1.025)
Urobilinogen, UA: 0.2 E.U./dL
pH, UA: 6 (ref 5.0–8.0)

## 2021-01-15 MED ORDER — PREGABALIN 50 MG PO CAPS: 50.0000 mg | ORAL_CAPSULE | Freq: Three times a day (TID) | ORAL | 1 refills | Status: DC

## 2021-01-15 NOTE — Assessment & Plan Note (Signed)
CT scan of abdomen ordered today.  Will follow up after scan is completed.

## 2021-01-15 NOTE — Assessment & Plan Note (Signed)
Vaginal swab to check for Bacterial vaginosis and yeast infection collected today  Urine dipstick and urine culture ordered as well.  Results to dictate further course of action.

## 2021-01-15 NOTE — Assessment & Plan Note (Signed)
Reviewed Trelegy administration to assist with hoarseness Continue Trelegy inhaler as prescribed.  Follow up in 3-6 months.

## 2021-01-15 NOTE — Assessment & Plan Note (Signed)
CT of abdomen ordered  Follow up after CT scan is complete Continue high fiber diet as recommended by GI.

## 2021-01-17 ENCOUNTER — Other Ambulatory Visit: Payer: Self-pay | Admitting: Family Medicine

## 2021-01-17 DIAGNOSIS — R300 Dysuria: Secondary | ICD-10-CM

## 2021-01-17 LAB — CERVICOVAGINAL ANCILLARY ONLY
Bacterial Vaginitis (gardnerella): NEGATIVE
Candida Glabrata: NEGATIVE
Candida Vaginitis: NEGATIVE
Comment: NEGATIVE
Comment: NEGATIVE
Comment: NEGATIVE

## 2021-01-17 MED ORDER — CIPROFLOXACIN HCL 250 MG PO TABS: 250.0000 mg | ORAL_TABLET | Freq: Two times a day (BID) | ORAL | 0 refills | Status: DC

## 2021-01-18 ENCOUNTER — Other Ambulatory Visit: Payer: Self-pay | Admitting: Pulmonary Disease

## 2021-01-18 ENCOUNTER — Other Ambulatory Visit: Payer: Self-pay | Admitting: Family Medicine

## 2021-01-18 LAB — CULTURE, URINE COMPREHENSIVE
MICRO NUMBER:: 12685251
SPECIMEN QUALITY:: ADEQUATE

## 2021-01-18 MED ORDER — NITROFURANTOIN MONOHYD MACRO 100 MG PO CAPS: 100.0000 mg | ORAL_CAPSULE | Freq: Two times a day (BID) | ORAL | 0 refills | Status: DC

## 2021-01-26 ENCOUNTER — Other Ambulatory Visit: Payer: Self-pay | Admitting: *Deleted

## 2021-01-26 DIAGNOSIS — Z87891 Personal history of nicotine dependence: Secondary | ICD-10-CM

## 2021-02-14 ENCOUNTER — Other Ambulatory Visit: Payer: Self-pay

## 2021-02-14 ENCOUNTER — Ambulatory Visit
Admission: RE | Admit: 2021-02-14 | Discharge: 2021-02-14 | Disposition: A | Discharge status: Home / Self Care | Payer: Medicare Other | Source: Ambulatory Visit | Attending: Family Medicine | Admitting: Family Medicine

## 2021-02-14 DIAGNOSIS — M810 Age-related osteoporosis without current pathological fracture: Secondary | ICD-10-CM | POA: Diagnosis not present

## 2021-02-14 DIAGNOSIS — K5730 Diverticulosis of large intestine without perforation or abscess without bleeding: Secondary | ICD-10-CM | POA: Diagnosis not present

## 2021-02-14 DIAGNOSIS — K314 Gastric diverticulum: Secondary | ICD-10-CM | POA: Diagnosis not present

## 2021-02-14 DIAGNOSIS — R140 Abdominal distension (gaseous): Secondary | ICD-10-CM | POA: Diagnosis not present

## 2021-02-14 DIAGNOSIS — Z87891 Personal history of nicotine dependence: Secondary | ICD-10-CM | POA: Diagnosis not present

## 2021-02-14 DIAGNOSIS — R1031 Right lower quadrant pain: Secondary | ICD-10-CM | POA: Insufficient documentation

## 2021-02-14 DIAGNOSIS — N200 Calculus of kidney: Secondary | ICD-10-CM | POA: Diagnosis not present

## 2021-02-16 ENCOUNTER — Other Ambulatory Visit: Payer: Self-pay | Admitting: Acute Care

## 2021-02-16 DIAGNOSIS — Z87891 Personal history of nicotine dependence: Secondary | ICD-10-CM

## 2021-02-26 ENCOUNTER — Other Ambulatory Visit: Payer: Self-pay | Admitting: Physician Assistant

## 2021-02-28 ENCOUNTER — Telehealth: Payer: Self-pay

## 2021-02-28 NOTE — Telephone Encounter (Signed)
Copied from [place] [id number]. Topic: General - Other >> Feb 28, 2021 12:08 PM Leward Quan A wrote: Reason for CRM: Patient called in to inquire of Dr Ancil Boozer if the results from her abdominal and pelvic scan are back yet say that she have gotten the result of her lung scan is waiting to hear from Dr Ancil Boozer. Asking for a call back at Ph#  [phone]

## 2021-02-28 NOTE — Telephone Encounter (Signed)
Per [facility] "Right kidney stone, but not causing obstruction. Fatty liver" Pt aware of results

## 2021-03-06 ENCOUNTER — Other Ambulatory Visit: Payer: Self-pay | Admitting: Family Medicine

## 2021-03-06 DIAGNOSIS — K219 Gastro-esophageal reflux disease without esophagitis: Secondary | ICD-10-CM

## 2021-03-12 ENCOUNTER — Other Ambulatory Visit: Payer: Self-pay | Admitting: Cardiovascular Disease

## 2021-03-30 ENCOUNTER — Other Ambulatory Visit: Payer: Self-pay

## 2021-03-30 DIAGNOSIS — Z122 Encounter for screening for malignant neoplasm of respiratory organs: Secondary | ICD-10-CM

## 2021-03-30 DIAGNOSIS — J449 Chronic obstructive pulmonary disease, unspecified: Secondary | ICD-10-CM

## 2021-04-05 ENCOUNTER — Other Ambulatory Visit: Payer: Self-pay

## 2021-04-05 DIAGNOSIS — J449 Chronic obstructive pulmonary disease, unspecified: Secondary | ICD-10-CM

## 2021-04-05 DIAGNOSIS — Z122 Encounter for screening for malignant neoplasm of respiratory organs: Secondary | ICD-10-CM

## 2021-04-17 ENCOUNTER — Other Ambulatory Visit: Payer: Self-pay | Admitting: Family Medicine

## 2021-04-17 DIAGNOSIS — Z122 Encounter for screening for malignant neoplasm of respiratory organs: Secondary | ICD-10-CM

## 2021-04-18 ENCOUNTER — Encounter: Payer: Self-pay | Admitting: Family Medicine

## 2021-04-18 ENCOUNTER — Ambulatory Visit (INDEPENDENT_AMBULATORY_CARE_PROVIDER_SITE_OTHER): Payer: Medicare Other | Admitting: Family Medicine

## 2021-04-18 VITALS — BP 110/68 | HR 76 | Temp 98.0°F | Resp 16 | Ht 71.0 in | Wt 169.5 lb

## 2021-04-18 DIAGNOSIS — G629 Polyneuropathy, unspecified: Secondary | ICD-10-CM | POA: Diagnosis not present

## 2021-04-18 DIAGNOSIS — D692 Other nonthrombocytopenic purpura: Secondary | ICD-10-CM

## 2021-04-18 DIAGNOSIS — I480 Paroxysmal atrial fibrillation: Secondary | ICD-10-CM

## 2021-04-18 DIAGNOSIS — Z79899 Other long term (current) drug therapy: Secondary | ICD-10-CM

## 2021-04-18 DIAGNOSIS — J449 Chronic obstructive pulmonary disease, unspecified: Secondary | ICD-10-CM | POA: Diagnosis not present

## 2021-04-18 DIAGNOSIS — I6523 Occlusion and stenosis of bilateral carotid arteries: Secondary | ICD-10-CM

## 2021-04-18 DIAGNOSIS — E782 Mixed hyperlipidemia: Secondary | ICD-10-CM

## 2021-04-18 DIAGNOSIS — I25119 Atherosclerotic heart disease of native coronary artery with unspecified angina pectoris: Secondary | ICD-10-CM

## 2021-04-18 DIAGNOSIS — R269 Unspecified abnormalities of gait and mobility: Secondary | ICD-10-CM

## 2021-04-18 DIAGNOSIS — R7989 Other specified abnormal findings of blood chemistry: Secondary | ICD-10-CM

## 2021-04-18 DIAGNOSIS — M810 Age-related osteoporosis without current pathological fracture: Secondary | ICD-10-CM

## 2021-04-18 DIAGNOSIS — E538 Deficiency of other specified B group vitamins: Secondary | ICD-10-CM

## 2021-04-18 DIAGNOSIS — I700 Atherosclerosis of aorta: Secondary | ICD-10-CM

## 2021-04-18 DIAGNOSIS — Z23 Encounter for immunization: Secondary | ICD-10-CM

## 2021-04-18 DIAGNOSIS — E559 Vitamin D deficiency, unspecified: Secondary | ICD-10-CM

## 2021-04-18 MED ORDER — SHINGRIX 50 MCG/0.5ML IM SUSR: 0.5000 mL | Freq: Once | INTRAMUSCULAR | 1 refills | Status: AC

## 2021-04-18 MED ORDER — TETANUS-DIPHTH-ACELL PERTUSSIS 5-2-15.5 LF-MCG/0.5 IM SUSP: 0.5000 mL | Freq: Once | INTRAMUSCULAR | 0 refills | Status: AC

## 2021-04-18 NOTE — Progress Notes (Deleted)
Name: Kristie Walters   MRN: [id number]    DOB: [birth year]   Date:04/18/2021       Progress Note  Subjective  Chief Complaint  Follow up   HPI  Abdominal distention/ Bloating: Patient had colonoscopy in 08/22/2020 and states they found 5 polyps and was advised to follow high fiber diet to help with bloating. She states she was still feeling bloated and concerned about the lower abdomen (bilateral lower quadrants) so we got a CT abdomen and pelvis that was unremarkable.. State fiber supplements are helping with bowel movements but bloating and distention are still present intermittently       02/14/2021 CT results  1. No acute abdominopelvic findings. 2. Hepatomegaly with diffuse hepatic steatosis. 3. Nonobstructive 7 mm right lower pole renal stone. 4. Colonic diverticulosis without findings of acute diverticulitis. 5. Aortic Atherosclerosis (ICD10-I70.0).  Urinary discomfort: patient states she is having mild, intermittent stinging, itching, and incomplete voiding,  not very bothersome lately   COPD: she recently went to pulmonologist and is continuing with Trelegy. She states it makes her hoarse but only occasionally, wheezing not as prominent lately. No cough but has intermittently SOB when walking outdoors for a prolonged period of time   Paroxysmal Afib.:  Still taking medications as prescribed. She has palpitation associated with sob a couple of times a week for only a few seconds. .   Echo done 03/09/2020   1. Left ventricular ejection fraction, by estimation, is 55 to 60%. The  left ventricle has normal function. The left ventricle has no regional  wall motion abnormalities. Left ventricular diastolic parameters are  consistent with Grade I diastolic  dysfunction (impaired relaxation).   2. Right ventricular systolic function is normal. The right ventricular  size is normal. There is normal pulmonary artery systolic pressure. The  estimated right ventricular systolic pressure is  82.9 mmHg.   3. The aortic valve was not well visualized.   Peripheral Neuropathy: states her feet and legs still bother her at night but not too bad. Taking Lyrica, she has low B12 level and has been taking supplementation and we will recheck level  Aorta Atherosclerosis: she is on statin therapy, reviewed CT done in 2018, she is also taking aspirin  Senile purpura: on right wrist , reassurance given, she sates sleeps with a medical alert bracelet on her wrist. She takes Eliquis   Abnormal TSH: we will recheck labs today, seems to be sub-clinical hypothyroidism, since normal T3, normal thyroid globulin and no symptoms  GERD: she is taking Omeprazole 40 mg , states symptoms are better controlled , taking medication prn now   Orthostatic hypotension: she has noticed some dizziness when she leans forward and gets up, she states at home bp can go as high as 150's not sure of DBP, today bp is low in our office, discussed hydration and get up slowly to avoid falls. 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   Fatty liver 09/29/2017   Coronary artery disease 09/29/2017   Centrilobular emphysema ([place]) 09/29/2017   Basal cell carcinoma of nose 08/12/2017   Estrogen deficiency 08/01/2017   Aortic atherosclerosis ([place]) 09/14/2015   Personal history of tobacco use, presenting hazards to health  08/31/2015   Paresthesia of foot, bilateral 08/15/2015   Breast mass, right 07/12/2015   Elevated alkaline phosphatase level 05/29/2015   COPD (chronic obstructive pulmonary disease) ([place]) 04/26/2015   GERD (gastroesophageal reflux disease) 04/26/2015   Hyperlipidemia 04/26/2015   Hyperglycemia 04/26/2015   Carotid stenosis 08/25/2014    H/O malignant neoplasm of skin 08/13/2012    Past Surgical History:  Procedure Laterality Date   BREAST BIOPSY     COLONOSCOPY WITH PROPOFOL N/A 08/22/2020   Procedure: COLONOSCOPY WITH PROPOFOL;  Surgeon: Lucilla Lame, MD;  Location: [facility] ENDOSCOPY;  Service: Endoscopy;  Laterality: N/A;   ENDARTERECTOMY Left 08/25/2014   Procedure: ENDARTERECTOMY CAROTID;  Surgeon: Algernon Huxley, MD;  Location: [facility] ORS;  Service: Vascular;  Laterality: Left;   MOHS SURGERY     MOHS SURGERY  09/23/2017   nose   PERIPHERAL VASCULAR CATHETERIZATION N/A 07/20/2014   Procedure: Carotid Angiography;  Surgeon: Algernon Huxley, MD;  Location: [facility];  Service: Cardiovascular;  Laterality: N/A;   TONSILLECTOMY     TUBAL LIGATION      Family History  Problem Relation Age of Onset   Heart attack Mother    Hypercholesterolemia Mother    Hypertension Mother    Peripheral vascular disease Mother    Dementia Mother    Hypothyroidism Mother    CVA Father    Liver cancer Father    Diabetes Brother    Kidney cancer Sister    Diabetes Brother    Alzheimer's disease Brother    Other Brother        alzheimers   Lymphoma Son    HIV Son    Breast cancer Neg Hx     Social History   Tobacco Use   Smoking status: Former    Packs/day: 1.00    Years: 55.00    Pack years: 55.00    Types: Cigarettes    Quit date: 11/26/2016    Years since quitting: 4.3   Smokeless tobacco: Former    Types: Snuff   Tobacco comments:    smoking cessation materials not required  Substance Use Topics   Alcohol use: No     Current Outpatient Medications:    albuterol (VENTOLIN HFA) 108 (90 Base) MCG/ACT inhaler, Inhale 2 puffs into the lungs every 6 (six) hours as needed for wheezing or shortness of breath., Disp: 8 g, Rfl: 2   aspirin 81 MG tablet, Take 81 mg by mouth daily., Disp: , Rfl:    atorvastatin (LIPITOR) 40 MG tablet, Take 1 tablet (40 mg total) by mouth daily., Disp: 90 tablet, Rfl: 3   calcium  carbonate (OS-CAL - DOSED IN MG OF ELEMENTAL CALCIUM) 1250 (500 Ca) MG tablet, Take 1 tablet by mouth., Disp: , Rfl:    cholecalciferol (VITAMIN D) 1000 units tablet, Take 1,000 Units by mouth daily., Disp: , Rfl:    Cyanocobalamin (VITAMIN B-12) 5000 MCG SUBL, Place under the tongue., Disp: , Rfl:    ELIQUIS 5 MG TABS tablet, TAKE 1 TABLET BY MOUTH TWICE A DAY, Disp: 180 tablet, Rfl: 1   ezetimibe (ZETIA) 10 MG tablet, TAKE 1 TABLET BY MOUTH EVERY DAY, Disp: 90 tablet, Rfl: 3   Fish Oil-Cholecalciferol (FISH OIL + D3 PO), Take 1,000 mg by mouth 1 day or 1 dose., Disp: , Rfl:    metoprolol tartrate (LOPRESSOR) 25 MG tablet, TAKE 1/2 OF A TABLET BY MOUTH 2 TIMES DAILY., Disp: 90 tablet, Rfl: 0   omeprazole (PRILOSEC) 40 MG  capsule, TAKE 1 CAPSULE (40 MG TOTAL) BY MOUTH AS NEEDED., Disp: 90 capsule, Rfl: 0   pregabalin (LYRICA) 50 MG capsule, Take 1 capsule (50 mg total) by mouth 3 (three) times daily., Disp: 270 capsule, Rfl: 1   Tdap (ADACEL) 06-19-13.5 LF-MCG/0.5 injection, Inject 0.5 mLs into the muscle once for 1 dose., Disp: 0.5 mL, Rfl: 0   TRELEGY ELLIPTA 100-62.5-25 MCG/ACT AEPB, TAKE 1 PUFF BY MOUTH EVERY DAY, Disp: 60 each, Rfl: 11   Zoster Vaccine Adjuvanted (SHINGRIX) injection, Inject 0.5 mLs into the muscle once for 1 dose., Disp: 0.5 mL, Rfl: 1   fluticasone (FLONASE) 50 MCG/ACT nasal spray, Place 2 sprays into both nostrils daily. (Patient not taking: Reported on 04/18/2021), Disp: 16 g, Rfl: 6  Allergies  Allergen Reactions   Morphine Nausea Only and Nausea And Vomiting   Morphine And Related Nausea And Vomiting    I personally reviewed active problem list, medication list, allergies, family history, social history with the patient/caregiver today.   ROS  Constitutional: Negative for fever or weight change.  Respiratory: Negative for cough and shortness of breath.   Cardiovascular: Negative for chest pain or palpitations.  Gastrointestinal: Negative for abdominal pain, no  bowel changes.  Musculoskeletal: Negative for gait problem or joint swelling.  Skin: Negative for rash.  Neurological: Negative for dizziness or headache.  No other specific complaints in a complete review of systems (except as listed in HPI above).   Objective  Vitals:   04/18/21 1330  BP: 110/68  Pulse: 76  Resp: 16  Temp: 98 F (36.7 C)  TempSrc: Oral  SpO2: 94%  Weight: 169 lb 8 oz (76.9 kg)  Height: 5\' 11"  (1.803 m)    Body mass index is 23.64 kg/m.  Physical Exam  Constitutional: Patient appears well-developed and well-nourished.  No distress.  HEENT: head atraumatic, normocephalic, pupils equal and reactive to light,  neck supple Cardiovascular: Normal rate, regular rhythm and normal heart sounds.  No murmur heard. No BLE edema. Pulmonary/Chest: Effort normal and breath sounds normal. No respiratory distress. Abdominal: Soft.  There is no tenderness. Psychiatric: Patient has a normal mood and affect. behavior is normal. Judgment and thought content normal.  Skin: senile purpura   PHQ2/9: Depression screen [facility] 2/9 04/18/2021 01/15/2021 09/29/2020 08/08/2020 06/30/2020  Decreased Interest 0 0 0 0 0  Down, Depressed, Hopeless 0 0 0 0 0  PHQ - 2 Score 0 0 0 0 0  Altered sleeping 0 1 - - -  Tired, decreased energy 0 1 - - -  Change in appetite 0 0 - - -  Feeling bad or failure about yourself  0 0 - - -  Trouble concentrating 0 0 - - -  Moving slowly or fidgety/restless 0 0 - - -  Suicidal thoughts 0 0 - - -  PHQ-9 Score 0 2 - - -  Difficult doing work/chores Not difficult at all - - - -  Some recent data might be hidden    phq 9 is negative  Fall Risk: Fall Risk  04/18/2021 01/15/2021 09/29/2020 08/08/2020 06/30/2020  Falls in the past year? 0 0 0 0 0  Comment - - - - -  Number falls in past yr: 0 0 0 0 0  Injury with Fall? 0 0 0 0 0  Risk for fall due to : No Fall Risks No Fall Risks - No Fall Risks -  Risk for fall due to: Comment - - - - -  Follow  up Education  provided Falls prevention discussed Falls evaluation completed Falls prevention discussed -     Functional Status Survey: Is the patient deaf or have difficulty hearing?: Yes Does the patient have difficulty seeing, even when wearing glasses/contacts?: Yes Does the patient have difficulty concentrating, remembering, or making decisions?: No Does the patient have difficulty walking or climbing stairs?: Yes Does the patient have difficulty dressing or bathing?: No Does the patient have difficulty doing errands alone such as visiting a doctor's office or shopping?: No    Assessment & Plan  1. COPD with chronic bronchitis and emphysema ([place])  Doing better   2. Aortic atherosclerosis ([place])   3. Senile purpura ([place])  Reassurance given   4. Neuropathy   5. Paroxysmal atrial fibrillation (HCC)   6. Coronary artery disease involving native coronary artery of native heart with angina pectoris ([place])   7. Abnormal TSH  - TSH  8. Need for tetanus, diphtheria, and acellular pertussis (Tdap) vaccine  - Tdap (ADACEL) 06-19-13.5 LF-MCG/0.5 injection; Inject 0.5 mLs into the muscle once for 1 dose.  Dispense: 0.5 mL; Refill: 0  9. Need for shingles vaccine  - Zoster Vaccine Adjuvanted [facility]) injection; Inject 0.5 mLs into the muscle once for 1 dose.  Dispense: 0.5 mL; Refill: 1  10. Vitamin D deficiency   11. B12 deficiency  - Vitamin B12  12. Mixed hyperlipidemia  - Lipid panel  13. Age-related osteoporosis without current pathological fracture   14. Gait difficulty  Using a cane  15. Bilateral carotid artery stenosis   16. Long-term use of high-risk medication  - COMPLETE METABOLIC PANEL WITH GFR - CBC with Differential/Platelet

## 2021-04-18 NOTE — Progress Notes (Signed)
Name: Kristie Walters   MRN: [id number]    DOB: [birth year]   Date:04/18/2021       Progress Note  Subjective  Chief Complaint  Follow up   HPI  Abdominal distention/ Bloating: Patient had colonoscopy in 08/22/2020 and states they found 5 polyps and was advised to follow high fiber diet to help with bloating. She states she is still bloated and expresses concerns of lower abdomen (bilateral lower quadrants). State fiber supplements are helping with bowel movements but bloating and distention are still present. States bloating is more uncomfortable than painful. States she is eating more "normal" again and reports "I'm a healthy eater".   Urinary discomfort: patient states she is having mild, intermittent stinging, itching, and incomplete voiding. She also voices concerns over increased vaginal discharge. This has been ongoing for several weeks.   COPD: she recently went to pulmonologist and is continuing with Trelegy. She states it makes her hoarse. She states she is wheezing at night when she lays down to go to bed. States pulm did not change her regimen with news of wheezing at night.   Paroxysmal Afib.: has follow up with cardiology at the beginning of new year. Still taking medications as prescribed. No side effects or episodes of a. Fib to report today.   Peripheral Neuropathy: states her feet and legs still bother her at night but not too bad. States she would like a refill for Lyrica today.   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   Fatty liver 09/29/2017   Coronary artery disease 09/29/2017   Centrilobular emphysema ([place]) 09/29/2017   Basal cell carcinoma of nose 08/12/2017   Estrogen deficiency  08/01/2017   Aortic atherosclerosis ([place]) 09/14/2015   Personal history of tobacco use, presenting hazards to health 08/31/2015   Paresthesia of foot, bilateral 08/15/2015   Breast mass, right 07/12/2015   Elevated alkaline phosphatase level 05/29/2015   COPD (chronic obstructive pulmonary disease) ([place]) 04/26/2015   GERD (gastroesophageal reflux disease) 04/26/2015   Hyperlipidemia 04/26/2015   Hyperglycemia 04/26/2015   Carotid stenosis 08/25/2014   H/O malignant neoplasm of skin 08/13/2012    Past Surgical History:  Procedure Laterality Date   BREAST BIOPSY     COLONOSCOPY WITH PROPOFOL N/A 08/22/2020   Procedure: COLONOSCOPY WITH PROPOFOL;  Surgeon: Lucilla Lame, MD;  Location: [facility] ENDOSCOPY;  Service: Endoscopy;  Laterality: N/A;   ENDARTERECTOMY Left 08/25/2014   Procedure: ENDARTERECTOMY CAROTID;  Surgeon: Algernon Huxley, MD;  Location: [facility] ORS;  Service: Vascular;  Laterality: Left;   MOHS SURGERY     MOHS SURGERY  09/23/2017   nose   PERIPHERAL VASCULAR CATHETERIZATION N/A 07/20/2014   Procedure: Carotid Angiography;  Surgeon: Algernon Huxley, MD;  Location: [place] [facility] LAB;  Service: Cardiovascular;  Laterality: N/A;   TONSILLECTOMY     TUBAL LIGATION      Family History  Problem Relation Age of Onset   Heart attack Mother    Hypercholesterolemia Mother    Hypertension Mother    Peripheral vascular disease Mother    Dementia Mother    Hypothyroidism Mother    CVA Father    Liver cancer Father    Diabetes Brother  Kidney cancer Sister    Diabetes Brother    Alzheimer's disease Brother    Other Brother        alzheimers   Lymphoma Son    HIV Son    Breast cancer Neg Hx     Social History   Tobacco Use   Smoking status: Former    Packs/day: 1.00    Years: 55.00    Pack years: 55.00    Types: Cigarettes    Quit date: 11/26/2016    Years since quitting: 4.3   Smokeless tobacco: Former    Types: Snuff   Tobacco comments:    smoking cessation materials  not required  Substance Use Topics   Alcohol use: No     Current Outpatient Medications:    albuterol (VENTOLIN HFA) 108 (90 Base) MCG/ACT inhaler, Inhale 2 puffs into the lungs every 6 (six) hours as needed for wheezing or shortness of breath., Disp: 8 g, Rfl: 2   aspirin 81 MG tablet, Take 81 mg by mouth daily., Disp: , Rfl:    atorvastatin (LIPITOR) 40 MG tablet, Take 1 tablet (40 mg total) by mouth daily., Disp: 90 tablet, Rfl: 3   calcium carbonate (OS-CAL - DOSED IN MG OF ELEMENTAL CALCIUM) 1250 (500 Ca) MG tablet, Take 1 tablet by mouth., Disp: , Rfl:    cholecalciferol (VITAMIN D) 1000 units tablet, Take 1,000 Units by mouth daily., Disp: , Rfl:    Cyanocobalamin (VITAMIN B-12) 5000 MCG SUBL, Place under the tongue., Disp: , Rfl:    ELIQUIS 5 MG TABS tablet, TAKE 1 TABLET BY MOUTH TWICE A DAY, Disp: 180 tablet, Rfl: 1   ezetimibe (ZETIA) 10 MG tablet, TAKE 1 TABLET BY MOUTH EVERY DAY, Disp: 90 tablet, Rfl: 3   Fish Oil-Cholecalciferol (FISH OIL + D3 PO), Take 1,000 mg by mouth 1 day or 1 dose., Disp: , Rfl:    FLUZONE HIGH-DOSE QUADRIVALENT 0.7 ML SUSY, , Disp: , Rfl:    metoprolol tartrate (LOPRESSOR) 25 MG tablet, TAKE 1/2 OF A TABLET BY MOUTH 2 TIMES DAILY., Disp: 90 tablet, Rfl: 0   MODERNA COVID-19 BIVAL BOOSTER 50 MCG/0.5ML injection, , Disp: , Rfl:    omeprazole (PRILOSEC) 40 MG capsule, TAKE 1 CAPSULE (40 MG TOTAL) BY MOUTH AS NEEDED., Disp: 90 capsule, Rfl: 0   pregabalin (LYRICA) 50 MG capsule, Take 1 capsule (50 mg total) by mouth 3 (three) times daily., Disp: 270 capsule, Rfl: 1   TRELEGY ELLIPTA 100-62.5-25 MCG/ACT AEPB, TAKE 1 PUFF BY MOUTH EVERY DAY, Disp: 60 each, Rfl: 11   fluticasone (FLONASE) 50 MCG/ACT nasal spray, Place 2 sprays into both nostrils daily. (Patient not taking: Reported on 04/18/2021), Disp: 16 g, Rfl: 6   nitrofurantoin, macrocrystal-monohydrate, (MACROBID) 100 MG capsule, Take 1 capsule (100 mg total) by mouth 2 (two) times daily. (Patient not taking:  Reported on 04/18/2021), Disp: 10 capsule, Rfl: 0  Allergies  Allergen Reactions   Morphine Nausea Only and Nausea And Vomiting   Morphine And Related Nausea And Vomiting    I personally reviewed active problem list, medication list, allergies, family history, social history with the patient/caregiver today.   ROS  Constitutional: Negative for fever or weight change.  Respiratory: Negative for cough and shortness of breath.   Cardiovascular: Negative for chest pain or palpitations.  Gastrointestinal: Negative for abdominal pain, no bowel changes.  Musculoskeletal: Negative for gait problem or joint swelling.  Skin: Negative for rash.  Neurological: Negative for dizziness or headache.  No  other specific complaints in a complete review of systems (except as listed in HPI above).   Objective  Vitals:   04/18/21 1330  BP: 110/68  Pulse: 76  Resp: 16  Temp: 98 F (36.7 C)  TempSrc: Oral  SpO2: 94%  Weight: 169 lb 8 oz (76.9 kg)  Height: 5\' 11"  (1.803 m)    Body mass index is 23.64 kg/m.  Physical Exam  Constitutional: Patient appears well-developed and well-nourished.  No distress.  HEENT: head atraumatic, normocephalic, pupils equal and reactive to light, neck supple Cardiovascular: Normal rate, regular rhythm and normal heart sounds.  No murmur heard. No BLE edema. Pulmonary/Chest: Effort normal and breath sounds normal. No respiratory distress. Abdominal: Soft.  There is no tenderness. Psychiatric: Patient has a normal mood and affect. behavior is normal. Judgment and thought content normal.  Skin:senile purpura arm  PHQ2/9: Depression screen [facility] 2/9 04/18/2021 01/15/2021 09/29/2020 08/08/2020 06/30/2020  Decreased Interest 0 0 0 0 0  Down, Depressed, Hopeless 0 0 0 0 0  PHQ - 2 Score 0 0 0 0 0  Altered sleeping 0 1 - - -  Tired, decreased energy 0 1 - - -  Change in appetite 0 0 - - -  Feeling bad or failure about yourself  0 0 - - -  Trouble concentrating 0 0 - - -   Moving slowly or fidgety/restless 0 0 - - -  Suicidal thoughts 0 0 - - -  PHQ-9 Score 0 2 - - -  Difficult doing work/chores Not difficult at all - - - -  Some recent data might be hidden    phq 9 is negative  Fall Risk: Fall Risk  04/18/2021 01/15/2021 09/29/2020 08/08/2020 06/30/2020  Falls in the past year? 0 0 0 0 0  Comment - - - - -  Number falls in past yr: 0 0 0 0 0  Injury with Fall? 0 0 0 0 0  Risk for fall due to : No Fall Risks No Fall Risks - No Fall Risks -  Risk for fall due to: Comment - - - - -  Follow up Education provided Falls prevention discussed Falls evaluation completed Falls prevention discussed -     Functional Status Survey: Is the patient deaf or have difficulty hearing?: Yes Does the patient have difficulty seeing, even when wearing glasses/contacts?: Yes Does the patient have difficulty concentrating, remembering, or making decisions?: No Does the patient have difficulty walking or climbing stairs?: Yes Does the patient have difficulty dressing or bathing?: No Does the patient have difficulty doing errands alone such as visiting a doctor's office or shopping?: No    Assessment & Plan  1. COPD with chronic bronchitis and emphysema ([place])   2. Aortic atherosclerosis ([place])   3. Senile purpura ([place])  Reassurance given   4. Neuropathy  It may be secondary to B12 deficiency   5. Paroxysmal atrial fibrillation (HCC)  Rate controlled in sinus rhythm today   6. Coronary artery disease involving native coronary artery of native heart with angina pectoris ([place])   7. Abnormal TSH  - TSH  8. Need for tetanus, diphtheria, and acellular pertussis (Tdap) vaccine  - Tdap vaccine greater than or equal to 7yo IM  9. Need for shingles vaccine  - Zoster Vaccine Adjuvanted [facility]) injection; Inject 0.5 mLs into the muscle once for 1 dose.  Dispense: 0.5 mL; Refill: 1  10. Vitamin D deficiency   11. B12 deficiency  - Vitamin B12  12. Mixed  hyperlipidemia  - Lipid panel  13. Age-related osteoporosis without current pathological fracture   14. Gait difficulty  Using a cane    15. Long-term use of high-risk medication  - COMPLETE METABOLIC PANEL WITH GFR - CBC with Differential/Platelet

## 2021-04-19 LAB — LIPID PANEL
Cholesterol: 142 mg/dL (ref <200)
HDL: 34 mg/dL — ABNORMAL LOW (ref >=50)
LDL Cholesterol (Calc): 79 mg/dL (calc)
Non-HDL Cholesterol (Calc): 108 mg/dL (calc) (ref <130)
Total CHOL/HDL Ratio: 4.2 (calc) (ref <5.0)
Triglycerides: 194 mg/dL — ABNORMAL HIGH (ref <150)

## 2021-04-19 LAB — COMPLETE METABOLIC PANEL WITH GFR
AG Ratio: 1.8 (calc) (ref 1.0–2.5)
ALT: 31 U/L — ABNORMAL HIGH (ref 6–29)
AST: 33 U/L (ref 10–35)
Albumin: 4.4 g/dL (ref 3.6–5.1)
Alkaline phosphatase (APISO): 116 U/L (ref 37–153)
BUN: 16 mg/dL (ref 7–25)
CO2: 28 mmol/L (ref 20–32)
Calcium: 9.7 mg/dL (ref 8.6–10.4)
Chloride: 106 mmol/L (ref 98–110)
Creat: 0.9 mg/dL (ref 0.60–1.00)
Globulin: 2.5 g/dL (calc) (ref 1.9–3.7)
Glucose, Bld: 80 mg/dL (ref 65–99)
Potassium: 5 mmol/L (ref 3.5–5.3)
Sodium: 141 mmol/L (ref 135–146)
Total Bilirubin: 0.8 mg/dL (ref 0.2–1.2)
Total Protein: 6.9 g/dL (ref 6.1–8.1)
eGFR: 67 mL/min/{1.73_m2} (ref >=60)

## 2021-04-19 LAB — CBC WITH DIFFERENTIAL/PLATELET
Absolute Monocytes: 751 cells/uL (ref 200–950)
Basophils Absolute: 67 cells/uL (ref 0–200)
Basophils Relative: 0.7 %
Eosinophils Absolute: 209 cells/uL (ref 15–500)
Eosinophils Relative: 2.2 %
HCT: 42.6 % (ref 35.0–45.0)
Hemoglobin: 14 g/dL (ref 11.7–15.5)
Lymphs Abs: 2090 cells/uL (ref 850–3900)
MCH: 29.1 pg (ref 27.0–33.0)
MCHC: 32.9 g/dL (ref 32.0–36.0)
MCV: 88.6 fL (ref 80.0–100.0)
MPV: 11.3 fL (ref 7.5–12.5)
Monocytes Relative: 7.9 %
Neutro Abs: 6384 cells/uL (ref 1500–7800)
Neutrophils Relative %: 67.2 %
Platelets: 257 10*3/uL (ref 140–400)
RBC: 4.81 10*6/uL (ref 3.80–5.10)
RDW: 12.9 % (ref 11.0–15.0)
Total Lymphocyte: 22 %
WBC: 9.5 10*3/uL (ref 3.8–10.8)

## 2021-04-19 LAB — VITAMIN B12: Vitamin B-12: 682 pg/mL (ref 200–1100)

## 2021-04-19 LAB — TSH: TSH: 3.92 mIU/L (ref 0.40–4.50)

## 2021-05-29 ENCOUNTER — Other Ambulatory Visit: Payer: Self-pay | Admitting: Cardiovascular Disease

## 2021-05-29 NOTE — Telephone Encounter (Signed)
Please schedule 12 month F/U appointment. Thank you! 

## 2021-05-29 NOTE — Telephone Encounter (Signed)
Attempted to schedule.  LMOV to call office.  ° °

## 2021-06-04 ENCOUNTER — Other Ambulatory Visit: Payer: Self-pay | Admitting: Family Medicine

## 2021-06-04 ENCOUNTER — Other Ambulatory Visit: Payer: Self-pay | Admitting: Cardiovascular Disease

## 2021-06-04 DIAGNOSIS — K219 Gastro-esophageal reflux disease without esophagitis: Secondary | ICD-10-CM

## 2021-06-04 NOTE — Telephone Encounter (Signed)
Prescription refill request for Eliquis received. ?Indication: Atrial Fib ?Last office visit: 05/26/20  Kristie Bridge MD ?Scr: 0.90 on 04/18/21 ?Age: 76 ?Weight: 78.1kg ? ?Based on above findings Eliquis '5mg'$  twice daily is the appropriate dose.  Refill approved. ? ?

## 2021-06-12 NOTE — Telephone Encounter (Signed)
Attempted to schedule.  

## 2021-06-18 NOTE — Progress Notes (Signed)
Cardiology Office Note    Date:  06/21/2021   ID:  REN…Kristie Walters, DOB [birth year], MRN [id number]  PCP:  Kristie Cory, MD  Cardiologist:  Kristie Nordmann, MD  Electrophysiologist:  None   Chief Complaint: Follow up  History of Present Illness:   Kristie Walters is a 76 y.o. 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   She was previously followed by Dr. Juliann Walters though subsequently transitioned to Dr. Mariah Walters in 09/2017.  Prior echo from 09/2015, done by outside cardiology group, showed an EF greater than 55%, mild LVH, normal RV systolic function, mild MR/TR.  Nuclear stress test at that time showed an EF of 77% with no evidence of significant ischemia or scar.  She was referred to [facility] in 09/2017 for evaluation of incidentally noted aortic atherosclerosis and coronary artery calcium along the LAD on noninvasive imaging.  She was noted to not be very active at baseline secondary to back pain.  She did note some rare palpitations associated with stress that improved with rest.  Aggressive primary prevention was recommended.  She declined stress testing at that time.   She was seen in the ED in 02/2019 with palpitations and noted to be in new onset A. fib with RVR with LBBB (EMS EKG).  She had spontaneously converted to sinus rhythm in the field following IV metoprolol.  She was noted to have a new left bundle which persisted in sinus rhythm.  High-sensitivity troponin negative  x2.  TSH mildly elevated at 5.041 with a potassium of 3.5.  She was placed on metoprolol and Eliquis.  Subsequent echo in 03/2019 showed an EF of 55 to 60%, no regional wall motion abnormalities, grade 2 diastolic dysfunction, normal RV systolic function and ventricular cavity size, normal size left atrium, and mildly elevated PASP.     She was seen on 01/05/2020 noting a 2-week history of increased palpitations as well as some associated dizziness, shortness of breath, and chest discomfort.  Lexiscan MPI on 01/11/2020 showed no evidence of significant ischemia or scar with a hyperdynamic LVEF greater than 65%.  With administration of regadenoson she developed a LBBB with known intermittent LBBB.  Attenuation corrected CT images noted aortic atherosclerosis with no significant coronary artery calcification.  Overall, this was a low risk study.  Zio patch showed a predominant rhythm of sinus with an average heart rate of 65 bpm.  First-degree AV block was noted.  18 episodes of SVT were noted with the longest interval lasting 10 beats with an average rate of 109 bpm and the fastest interval lasting 4 beats with a maximum rate of 158 bpm.  Isolated PACs, atrial couplets, atrial triplets, and PVCs were noted.  There were 6 patient triggered events and these were not associated with significant arrhythmia.  Echo in 02/2020 showed an EF of 55-60%, no RWMA, Gr1DD, normal RV systolic function and ventricular cavity size, and a PASP of 35.7 mmHg.  She was last seen in the office in 05/2020 and was without symptoms of angina  or decompensation.   She comes in today noting onset of left-sided palpitations and pinpoint chest discomfort that began after taking metoprolol tartrate from a different manufacturer.  Symptoms will typically last for 1 to 2 minutes and spontaneously resolved.  With this, she self discontinued Lopressor and has noted an improvement, though not resolution of symptoms.  No associated dizziness,  presyncope, or syncope.  Also in the setting of the above, she has been under increased stress with the health of her son, as he will be receiving a bone marrow transplant next month.  The patient was informed recently that the donor is actually her daughter.  With this, she is experiencing increased stress.  No lower extremity swelling or orthopnea.  No falls, hematochezia, or melena.   Labs independently reviewed: 04/2021 - TC 142, TG 194, HDL 34, LDL 79, HGB 14.0, PLT 257, BUN 16, SCr 0.90, potassium 5.0, albumin 4.4, AST normal, ALT 31, TSH normal  Past Medical History:  Diagnosis Date   Allergy    Atrial fibrillation (HCC)    Basal cell carcinoma of nose 08/12/2017   Carotid artery disease (HCC)    s/p L. CEA in July 2016   Centrilobular emphysema (HCC) 09/29/2017   Collagen vascular disease (HCC)    Left carotid stenosis   COPD (chronic obstructive pulmonary disease) (HCC)    Coronary artery disease 09/29/2017   Noted on chest CT July 2019   Dysrhythmia    Elevated blood pressure (not hypertension)    Fatty liver 09/29/2017   Chest CT July 2019   GERD (gastroesophageal reflux disease)    Hyperlipidemia    Personal history of tobacco use, presenting hazards to health 08/31/2015   PONV (postoperative nausea and vomiting)     Past Surgical History:  Procedure Laterality Date   BREAST BIOPSY     COLONOSCOPY WITH PROPOFOL N/A 08/22/2020   Procedure: COLONOSCOPY WITH PROPOFOL;  Surgeon: Midge Minium, MD;  Location: [facility] ENDOSCOPY;  Service: Endoscopy;  Laterality: N/A;   ENDARTERECTOMY Left 08/25/2014   Procedure: ENDARTERECTOMY CAROTID;  Surgeon: Annice Needy, MD;  Location: [facility] ORS;  Service: Vascular;  Laterality: Left;   MOHS SURGERY     MOHS SURGERY  09/23/2017   nose   PERIPHERAL VASCULAR CATHETERIZATION N/A 07/20/2014   Procedure: Carotid Angiography;  Surgeon: Annice Needy, MD;  Location: [facility] INVASIVE CV LAB;  Service: Cardiovascular;  Laterality: N/A;   TONSILLECTOMY      TUBAL LIGATION      Current Medications: Current Meds  Medication Sig   albuterol (VENTOLIN HFA) 108 (90 Base) MCG/ACT inhaler Inhale 2 puffs into the lungs every 6 (six) hours as needed for wheezing or shortness of breath.   aspirin 81 MG tablet Take 81 mg by mouth daily.   atorvastatin (LIPITOR) 40 MG tablet TAKE 1 TABLET BY MOUTH EVERY DAY   calcium carbonate (OS-CAL - DOSED IN MG OF ELEMENTAL CALCIUM) 1250 (500 Ca) MG tablet Take 1 tablet by mouth.   cholecalciferol (VITAMIN D) 1000 units tablet Take 1,000 Units by mouth daily.   Cyanocobalamin (VITAMIN B-12) 5000 MCG SUBL Place under the tongue.   ELIQUIS 5 MG TABS tablet TAKE 1 TABLET BY MOUTH TWICE A DAY   ezetimibe (ZETIA) 10 MG tablet TAKE 1 TABLET BY MOUTH EVERY DAY   Fish Oil-Cholecalciferol (FISH OIL + D3 PO) Take 1,000 mg by mouth 1 day or 1 dose.   fluticasone (FLONASE) 50 MCG/ACT nasal spray Place 2 sprays into both nostrils daily.  metoprolol succinate (TOPROL XL) 25 MG 24 hr tablet Take 1 tablet (25 mg total) by mouth daily.   omeprazole (PRILOSEC) 40 MG capsule TAKE 1 CAPSULE (40 MG TOTAL) BY MOUTH AS NEEDED   pregabalin (LYRICA) 50 MG capsule Take 1 capsule (50 mg total) by mouth 3 (three) times daily.   TRELEGY ELLIPTA 100-62.5-25 MCG/ACT AEPB TAKE 1 PUFF BY MOUTH EVERY DAY    Allergies:   Morphine and Morphine and related   Social History   Socioeconomic History   Marital status: Divorced    Spouse name: Not on file   Number of children: 2   Years of education: Not on file   Highest education level: 10th grade  Occupational History   Occupation: Retired  Tobacco Use   Smoking status: Former    Packs/day: 1.00    Years: 55.00    Pack years: 55.00    Types: Cigarettes    Quit date: 11/26/2016    Years since quitting: 4.5   Smokeless tobacco: Former    Types: Snuff   Tobacco comments:    smoking cessation materials not required  Vaping Use   Vaping Use: Never used  Substance and Sexual Activity    Alcohol use: No   Drug use: No   Sexual activity: Not Currently  Other Topics Concern   Not on file  Social History Narrative    Pt lives alone   Social Determinants of Health   Financial Resource Strain: Low Risk    Difficulty of Paying Living Expenses: Not hard at all  Food Insecurity: No Food Insecurity   Worried About Programme researcher, broadcasting/film/video in the Last Year: Never true   Ran Out of Food in the Last Year: Never true  Transportation Needs: No Transportation Needs   Lack of Transportation (Medical): No   Lack of Transportation (Non-Medical): No  Physical Activity: Inactive   Days of Exercise per Week: 0 days   Minutes of Exercise per Session: 0 min  Stress: No Stress Concern Present   Feeling of Stress : Not at all  Social Connections: Moderately Isolated   Frequency of Communication with Friends and Family: More than three times a week   Frequency of Social Gatherings with Friends and Family: Three times a week   Attends Religious Services: More than 4 times per year   Active Member of Clubs or Organizations: No   Attends Banker Meetings: Never   Marital Status: Divorced     Family History:  The patient's family history includes Alzheimer's disease in her brother; CVA in her father; Dementia in her mother; Diabetes in her brother and brother; HIV in her son; Heart attack in her mother; Hypercholesterolemia in her mother; Hypertension in her mother; Hypothyroidism in her mother; Kidney cancer in her sister; Liver cancer in her father; Lymphoma in her son; Other in her brother; Peripheral vascular disease in her mother. There is no history of Breast cancer.  ROS:   12 point review of systems is negative unless otherwise noted in the HPI.   EKGs/Labs/Other Studies Reviewed:    Studies reviewed were summarized above. The additional studies were reviewed today:  2D echo 03/09/2020: 1. Left ventricular ejection fraction, by estimation, is 55 to 60%. The  left  ventricle has normal function. The left ventricle has no regional  wall motion abnormalities. Left ventricular diastolic parameters are  consistent with Grade I diastolic  dysfunction (impaired relaxation).   2. Right ventricular systolic function is normal. The  right ventricular  size is normal. There is normal pulmonary artery systolic pressure. The  estimated right ventricular systolic pressure is 35.7 mmHg.   3. The aortic valve was not well visualized. __________  Eugenie Birks MPI 01/11/2020: Normal pharmacologic myocardial perfusion stress test without evidence of significant ischemia or scar. The left ventricular ejection fraction is hyperdynamic (>65%). Left bundle branch block developed after administration of regadenoson. Patient known to have intermittent LBBB. Attenuation correction CT is notable for aortic atherosclerosis. There is no significant coronary artery calcification. This is a low risk study. __________   Luci Bank patch 12/2019: Normal sinus rhythm avg HR of 65 bpm.    Patient triggered events (6) were not associated with significant arrhythmia.   First Degree AV Block was present.  18 Supraventricular Tachycardia runs occurred, the run with the fastest interval lasting 4 beats with a max rate of 158 bpm, the longest lasting 10 beats with an avg rate of 109 bpm.   Isolated SVEs were rare (<1.0%), SVE Couplets were rare (<1.0%), and SVE Triplets were rare (<1.0%). Isolated VEs were rare (<1.0%), and no VE Couplets or VE Triplets were present. __________   2D echo 03/2019: 1. Left ventricular ejection fraction, by visual estimation, is 55 to  60%. The left ventricle has normal function. There is no left ventricular  hypertrophy.   2. Left ventricular diastolic parameters are consistent with Grade II  diastolic dysfunction (pseudonormalization).   3. The left ventricle has no regional wall motion abnormalities.   4. Global right ventricle has normal systolic  function.The right  ventricular size is normal. No increase in right ventricular wall  thickness.   5. Left atrial size was normal.   6. Mildly elevated pulmonary artery systolic pressure.   EKG:  EKG is ordered today.  The EKG ordered today demonstrates NSR, 92 bpm, LBBB (known)  Recent Labs: 04/18/2021: ALT 31; BUN 16; Creat 0.90; Hemoglobin 14.0; Platelets 257; Potassium 5.0; Sodium 141; TSH 3.92  Recent Lipid Panel    Component Value Date/Time   CHOL 142 04/18/2021 1423   CHOL 155 05/08/2015 0927   TRIG 194 (H) 04/18/2021 1423   HDL 34 (L) 04/18/2021 1423   HDL 30 (L) 05/08/2015 0927   CHOLHDL 4.2 04/18/2021 1423   LDLCALC 79 04/18/2021 1423    PHYSICAL EXAM:    VS:  BP (!) 152/70 (BP Location: Left Arm, Patient Position: Sitting, Cuff Size: Normal)   Pulse 92   Ht 5\' 11"  (1.803 m)   Wt 172 lb (78 kg)   SpO2 97%   BMI 23.99 kg/m   BMI: Body mass index is 23.99 kg/m.  Physical Exam Vitals reviewed.  Constitutional:      Appearance: She is well-developed.  HENT:     Head: Normocephalic and atraumatic.  Eyes:     General:        Right eye: No discharge.        Left eye: No discharge.  Neck:     Vascular: No JVD.  Cardiovascular:     Rate and Rhythm: Normal rate and regular rhythm.     Pulses:          Posterior tibial pulses are 2+ on the right side and 2+ on the left side.     Heart sounds: Normal heart sounds, S1 normal and S2 normal. Heart sounds not distant. No midsystolic click and no opening snap. No murmur heard.   No friction rub.  Pulmonary:     Effort: Pulmonary effort  is normal. No respiratory distress.     Breath sounds: Normal breath sounds. No decreased breath sounds, wheezing or rales.  Chest:     Chest wall: No tenderness.  Abdominal:     General: There is no distension.     Palpations: Abdomen is soft.     Tenderness: There is no abdominal tenderness.  Musculoskeletal:     Cervical back: Normal range of motion.     Right lower leg: No  edema.     Left lower leg: No edema.  Skin:    General: Skin is warm and dry.     Nails: There is no clubbing.  Neurological:     Mental Status: She is alert and oriented to person, place, and time.  Psychiatric:        Speech: Speech normal.        Behavior: Behavior normal.        Thought Content: Thought content normal.        Judgment: Judgment normal.    Wt Readings from Last 3 Encounters:  06/21/21 172 lb (78 kg)  04/18/21 169 lb 8 oz (76.9 kg)  02/14/21 170 lb (77.1 kg)     ASSESSMENT & PLAN:   CAD involving the native coronary arteries without angina: She is doing well without symptoms concerning for angina.  She does note some pinpoint left-sided chest discomfort that is randomly occurring and spontaneously resolves after 1 or 2 minutes that appears atypical and noncardiac in etiology.  Recent Lexiscan MPI reassuring.  Continue aggressive risk factor modification and current medical therapy.  PAF/pSVT: She did note an increase in tachypalpitations following refill of Lopressor that was of a different manufacturer.  With discontinuation of this pill she has noted an improvement, though not resolution of palpitations.  We will undergo a transition to Toprol-XL 25 mg daily.  If palpitation symptoms persist we will consider repeating a Zio patch in follow-up.  Given underlying A-fib, she remains on apixaban with a CHA2DS2-VASc of at least 3.  Diastolic dysfunction with elevated blood pressure: She appears euvolemic and well compensated.  Not requiring a standing diuretic.  Her blood pressure is mildly elevated in the office today, though she is under increased stress surrounding her son's upcoming stem cell transplant.  We did add back metoprolol as outlined above and we will continue to monitor her blood pressure.  LBBB: Recent echo demonstrated preserved LV systolic function with Lexiscan MPI in 2021 showing no evidence of ischemia.  No syncope.  Stable.  Continue to  monitor.  HLD: LDL 79.  She remains on atorvastatin 40 mg.  Carotid artery disease: Status post left-sided CEA in 08/2014.  She remains on aspirin and atorvastatin.  Follow-up with vascular surgery as directed.  COPD: Stable without acute exacerbation.   Disposition: F/u with Dr. Mariah Walters or an APP in 1 month.   Medication Adjustments/Labs and Tests Ordered: Current medicines are reviewed at length with the patient today.  Concerns regarding medicines are outlined above. Medication changes, Labs and Tests ordered today are summarized above and listed in the Patient Instructions accessible in Encounters.   Signed, Eula Listen, PA-C 06/21/2021 4:08 PM     [facility] [address] [place], [place] [address] [phone]

## 2021-06-21 ENCOUNTER — Other Ambulatory Visit: Payer: Self-pay | Admitting: Family Medicine

## 2021-06-21 ENCOUNTER — Ambulatory Visit (INDEPENDENT_AMBULATORY_CARE_PROVIDER_SITE_OTHER): Payer: Medicare Other | Admitting: Physician Assistant

## 2021-06-21 ENCOUNTER — Encounter: Payer: Self-pay | Admitting: Physician Assistant

## 2021-06-21 VITALS — BP 152/70 | HR 92 | Ht 71.0 in | Wt 172.0 lb

## 2021-06-21 DIAGNOSIS — I480 Paroxysmal atrial fibrillation: Secondary | ICD-10-CM | POA: Diagnosis not present

## 2021-06-21 DIAGNOSIS — E785 Hyperlipidemia, unspecified: Secondary | ICD-10-CM

## 2021-06-21 DIAGNOSIS — I5189 Other ill-defined heart diseases: Secondary | ICD-10-CM | POA: Diagnosis not present

## 2021-06-21 DIAGNOSIS — I6523 Occlusion and stenosis of bilateral carotid arteries: Secondary | ICD-10-CM

## 2021-06-21 DIAGNOSIS — I2510 Atherosclerotic heart disease of native coronary artery without angina pectoris: Secondary | ICD-10-CM | POA: Diagnosis not present

## 2021-06-21 DIAGNOSIS — I471 Supraventricular tachycardia: Secondary | ICD-10-CM | POA: Diagnosis not present

## 2021-06-21 MED ORDER — METOPROLOL SUCCINATE ER 25 MG PO TB24: 25.0000 mg | ORAL_TABLET | Freq: Every day | ORAL | 3 refills | Status: DC

## 2021-06-21 NOTE — Patient Instructions (Signed)
Medication Instructions:  ?Your physician has recommended you make the following change in your medication:  ? ?STOP Metoprolol tartrate ?START Toprol XL 25 mg once a day ? ?*If you need a refill on your cardiac medications before your next appointment, please call your pharmacy* ? ? ?Lab Work: ?None ? ?If you have labs (blood work) drawn today and your tests are completely normal, you will receive your results only by: ?MyChart Message (if you have MyChart) OR ?A paper copy in the mail ?If you have any lab test that is abnormal or we need to change your treatment, we will call you to review the results. ? ? ?Testing/Procedures: ?None ? ? ?Follow-Up: ?At [facility], you and your health needs are our priority.  As part of our continuing mission to provide you with exceptional heart care, we have created designated Provider Care Teams.  These Care Teams include your primary Cardiologist (physician) and Advanced Practice Providers (APPs -  Physician Assistants and Nurse Practitioners) who all work together to provide you with the care you need, when you need it. ? ?We recommend signing up for the patient portal called "MyChart".  Sign up information is provided on this After Visit Summary.  MyChart is used to connect with patients for Virtual Visits (Telemedicine).  Patients are able to view lab/test results, encounter notes, upcoming appointments, etc.  Non-urgent messages can be sent to your provider as well.   ?To learn more about what you can do with MyChart, go to [email or web address].   ? ?Your next appointment:   ?1 month(s) ? ?The format for your next appointment:   ?In Person ? ?Provider:   ?Ida Rogue, MD or Christell Faith, PA-C  ? ? ? ? ? ? ?Important Information About Sugar ? ? ? ? ?  ?

## 2021-06-25 ENCOUNTER — Other Ambulatory Visit: Payer: Self-pay | Admitting: Cardiovascular Disease

## 2021-07-20 ENCOUNTER — Ambulatory Visit (INDEPENDENT_AMBULATORY_CARE_PROVIDER_SITE_OTHER): Payer: Medicare Other

## 2021-07-20 ENCOUNTER — Ambulatory Visit (INDEPENDENT_AMBULATORY_CARE_PROVIDER_SITE_OTHER): Payer: Medicare Other | Admitting: Vascular Surgery

## 2021-07-20 DIAGNOSIS — I6523 Occlusion and stenosis of bilateral carotid arteries: Secondary | ICD-10-CM

## 2021-08-01 ENCOUNTER — Encounter: Payer: Self-pay | Admitting: Pulmonary Disease

## 2021-08-01 ENCOUNTER — Ambulatory Visit (INDEPENDENT_AMBULATORY_CARE_PROVIDER_SITE_OTHER): Payer: Medicare Other | Admitting: Pulmonary Disease

## 2021-08-01 VITALS — BP 110/68 | HR 75 | Temp 97.8°F | Ht 71.0 in | Wt 172.8 lb

## 2021-08-01 DIAGNOSIS — J449 Chronic obstructive pulmonary disease, unspecified: Secondary | ICD-10-CM

## 2021-08-01 DIAGNOSIS — Z87891 Personal history of nicotine dependence: Secondary | ICD-10-CM | POA: Diagnosis not present

## 2021-08-01 DIAGNOSIS — J4489 Other specified chronic obstructive pulmonary disease: Secondary | ICD-10-CM

## 2021-08-01 NOTE — Patient Instructions (Addendum)
Your lungs sound good today.  Continue using Trelegy.  We will see you in follow-up in 6 months time call sooner should any new problems arise.

## 2021-08-02 ENCOUNTER — Encounter (INDEPENDENT_AMBULATORY_CARE_PROVIDER_SITE_OTHER): Payer: Self-pay | Admitting: *Deleted

## 2021-08-02 NOTE — Progress Notes (Signed)
Cardiology Office Note    Date:  08/03/2021   ID:  Kristie Walters, DOB 01/05/1946, MRN [id number]  PCP:  Steele Sizer, MD  Cardiologist:  Ida Rogue, MD  Electrophysiologist:  None   Chief Complaint: Follow-up  History of Present Illness:   Kristie Walters is a 76 y.o. 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   She was previously followed by Dr. Clayborn Walters though subsequently transitioned to Dr. Rockey Walters in 09/2017.  Prior echo from 09/2015, done by outside cardiology group, showed an EF greater than 55%, mild LVH, normal RV systolic function, mild MR/TR.  Nuclear stress test at that time showed an EF of 77% with no evidence of significant ischemia or scar.  She was referred to [facility] in 09/2017 for evaluation of incidentally noted aortic atherosclerosis and coronary artery calcium along the LAD on noninvasive imaging.  She was noted to not be very active at baseline secondary to back pain.  She did note some rare palpitations associated with stress that improved with rest.  Aggressive primary prevention was recommended.  She declined stress testing at that time.   She was seen in the ED in 02/2019 with palpitations and noted to be in new onset A. fib with RVR with LBBB (EMS EKG).  She had spontaneously converted to sinus rhythm in the field following IV metoprolol.  She was noted to have a new left bundle which persisted in sinus rhythm.  High-sensitivity troponin negative  x2.  TSH mildly elevated at 5.041 with a potassium of 3.5.  She was placed on metoprolol and Eliquis.  Subsequent echo in 03/2019 showed an EF of 55 to 60%, no regional wall motion abnormalities, grade 2 diastolic dysfunction, normal RV systolic function and ventricular cavity size, normal size left atrium, and mildly elevated PASP.     She was seen on 01/05/2020 noting a 2-week history of increased palpitations as well as some associated dizziness, shortness of breath, and chest discomfort.  Lexiscan MPI on 01/11/2020 showed no evidence of significant ischemia or scar with a hyperdynamic LVEF greater than 65%.  With administration of regadenoson she developed a LBBB with known intermittent LBBB.  Attenuation corrected CT images noted aortic atherosclerosis with no significant coronary artery calcification.  Overall, this was a low risk study.  Zio patch showed a predominant rhythm of sinus with an average heart rate of 65 bpm.  First-degree AV block was noted.  18 episodes of SVT were noted with the longest interval lasting 10 beats with an average rate of 109 bpm and the fastest interval lasting 4 beats with a maximum rate of 158 bpm.  Isolated PACs, atrial couplets, atrial triplets, and PVCs were noted.  There were 6 patient triggered events and these were not associated with significant arrhythmia.  Echo in 02/2020 showed an EF of 55-60%, no RWMA, Gr1DD, normal RV systolic function and ventricular cavity size, and a PASP of 35.7 mmHg.   She was last seen in the office in 5//2023 and was without symptoms of angina or  decompensation.  She did report onset of left-sided palpitations and pinpoint chest discomfort that began after taking metoprolol titrate from a different manufacturer with symptoms typically lasting 1 to 2 minutes and spontaneously resolving.  With this, she self discontinued Lopressor and noted improvement, though not resolution of symptoms.  She was also under increased stress with the health of  her son.  We underwent a trial of Toprol-XL 25 mg daily.  She comes in doing reasonably well from a cardiac perspective and is without symptoms of angina or decompensation.  She notes chronic stable exertional dyspnea.  Her main complaint at this time is fatigue that has been present for about the past week.  No significant lower extremity swelling or orthopnea.  She does report a long history of abdominal bloating.  Weight stable.  No falls or symptoms concerning for bleeding.  Since starting metoprolol succinate, she has noted resolution of palpitations.  No dizziness, presyncope, or syncope.  She does not believe she snores and does not recall waking up gasping for air.  No prior sleep study.  Her son and daughter are doing well following stem cell donor/transplant.     Sleep Apnea Evaluation    STOP-BANG RISK ASSESSMENT       08/03/2021    2:34 PM  STOP-BANG  Do you snore loudly? No  Do you often feel tired, fatigued, or sleepy during the daytime? Yes  Has anyone observed you stop breathing during sleep? No  Do you have (or are you being treated for) high blood pressure? No  Recent BMI (Calculated) 23.86  Is BMI greater than 35 kg/m2? 0=No  Age older than 76 years old? 1=Yes  Has large neck size > 40 cm (15.7 in, large female shirt size, large female collar size > 16) No  Gender - Female 0=No  STOP-Bang Total Score 2      If STOP-BANG Score ?3 OR two clinical symptoms - patient qualifies for WatchPAT     Labs independently reviewed: 04/2021 - TC 142, TG 194, HDL 34, LDL 79, HGB 14.0, PLT 257, BUN 16, SCr 0.90, potassium 5.0, albumin 4.4, AST normal, ALT 31, TSH normal  Past Medical History:  Diagnosis Date   Allergy    Atrial fibrillation ([place])    Basal cell carcinoma of nose 08/12/2017   Carotid artery disease (HCC)    s/p L. CEA in July 2016   Centrilobular emphysema ([place]) 09/29/2017   Collagen vascular disease ([place])    Left carotid stenosis   COPD (chronic obstructive  pulmonary disease) (HCC)    Coronary artery disease 09/29/2017   Noted on chest CT July 2019   Dysrhythmia    Elevated blood pressure (not hypertension)    Fatty liver 09/29/2017   Chest CT July 2019   GERD (gastroesophageal reflux disease)    Hyperlipidemia    Personal history of tobacco use, presenting hazards to health 08/31/2015   PONV (postoperative nausea and vomiting)     Past Surgical History:  Procedure Laterality Date   BREAST BIOPSY     COLONOSCOPY WITH PROPOFOL N/A 08/22/2020   Procedure: COLONOSCOPY WITH PROPOFOL;  Surgeon: Lucilla Lame, MD;  Location: [facility] ENDOSCOPY;  Service: Endoscopy;  Laterality: N/A;   ENDARTERECTOMY Left 08/25/2014   Procedure: ENDARTERECTOMY CAROTID;  Surgeon: Algernon Huxley, MD;  Location: [facility] ORS;  Service: Vascular;  Laterality: Left;   MOHS SURGERY     MOHS SURGERY  09/23/2017   nose   PERIPHERAL VASCULAR CATHETERIZATION N/A 07/20/2014  Procedure: Carotid Angiography;  Surgeon: Algernon Huxley, MD;  Location: [facility];  Service: Cardiovascular;  Laterality: N/A;   TONSILLECTOMY     TUBAL LIGATION      Current Medications: Current Meds  Medication Sig   albuterol (VENTOLIN HFA) 108 (90 Base) MCG/ACT inhaler Inhale 2 puffs into the lungs every 6 (six) hours as needed for wheezing or shortness of breath.   aspirin 81 MG tablet Take 81 mg by mouth daily.   atorvastatin (LIPITOR) 40 MG tablet TAKE 1 TABLET BY MOUTH EVERY DAY   calcium carbonate (OS-CAL - DOSED IN MG OF ELEMENTAL CALCIUM) 1250 (500 Ca) MG tablet Take 1 tablet by mouth.   cholecalciferol (VITAMIN D) 1000 units tablet Take 1,000 Units by mouth daily.   Cyanocobalamin (VITAMIN B-12) 5000 MCG SUBL Place under the tongue.   ELIQUIS 5 MG TABS tablet TAKE 1 TABLET BY MOUTH TWICE A DAY   ezetimibe (ZETIA) 10 MG tablet TAKE 1 TABLET BY MOUTH EVERY DAY   Fish Oil-Cholecalciferol (FISH OIL + D3 PO) Take 1,000 mg by mouth 1 day or 1 dose.   fluticasone (FLONASE) 50 MCG/ACT nasal spray  Place 2 sprays into both nostrils daily.   metoprolol succinate (TOPROL XL) 25 MG 24 hr tablet Take 1 tablet (25 mg total) by mouth daily.   omeprazole (PRILOSEC) 40 MG capsule TAKE 1 CAPSULE (40 MG TOTAL) BY MOUTH AS NEEDED   pregabalin (LYRICA) 50 MG capsule Take 1 capsule (50 mg total) by mouth 3 (three) times daily.   TRELEGY ELLIPTA 100-62.5-25 MCG/ACT AEPB TAKE 1 PUFF BY MOUTH EVERY DAY    Allergies:   Morphine and Morphine and related   Social History   Socioeconomic History   Marital status: Divorced    Spouse name: Not on file   Number of children: 2   Years of education: Not on file   Highest education level: 10th grade  Occupational History   Occupation: Retired  Tobacco Use   Smoking status: Former    Packs/day: 1.00    Years: 55.00    Total pack years: 55.00    Types: Cigarettes    Quit date: 11/26/2016    Years since quitting: 4.6   Smokeless tobacco: Former    Types: Snuff   Tobacco comments:    smoking cessation materials not required  Vaping Use   Vaping Use: Never used  Substance and Sexual Activity   Alcohol use: No   Drug use: No   Sexual activity: Not Currently  Other Topics Concern   Not on file  Social History Narrative    Pt lives alone   Social Determinants of Health   Financial Resource Strain: Low Risk  (08/08/2020)   Overall Financial Resource Strain (CARDIA)    Difficulty of Paying Living Expenses: Not hard at all  Food Insecurity: No [place] (08/08/2020)   Hunger Vital Sign    Worried About Running Out of Food in the Last Year: Never true    [place] in the Last Year: Never true  Transportation Needs: No Transportation Needs (08/08/2020)   PRAPARE - Hydrologist (Medical): No    Lack of Transportation (Non-Medical): No  Physical Activity: Inactive (08/08/2020)   Exercise Vital Sign    Days of Exercise per Week: 0 days    Minutes of Exercise per Session: 0 min  Stress: No Stress Concern  Present (08/08/2020)   [place] -  Occupational Stress Questionnaire    Feeling of Stress : Not at all  Social Connections: Moderately Isolated (08/08/2020)   Social Connection and Isolation Panel [NHANES]    Frequency of Communication with Friends and Family: More than three times a week    Frequency of Social Gatherings with Friends and Family: Three times a week    Attends Religious Services: More than 4 times per year    Active Member of Clubs or Organizations: No    Attends Archivist Meetings: Never    Marital Status: Divorced     Family History:  The patient's family history includes Alzheimer's disease in her brother; CVA in her father; Dementia in her mother; Diabetes in her brother and brother; HIV in her son; Heart attack in her mother; Hypercholesterolemia in her mother; Hypertension in her mother; Hypothyroidism in her mother; Kidney cancer in her sister; Liver cancer in her father; Lymphoma in her son; Other in her brother; Peripheral vascular disease in her mother. There is no history of Breast cancer.  ROS:   12-point review of systems is negative unless otherwise noted in the HPI.   EKGs/Labs/Other Studies Reviewed:    Studies reviewed were summarized above. The additional studies were reviewed today:    2D echo 03/09/2020: 1. Left ventricular ejection fraction, by estimation, is 55 to 60%. The  left ventricle has normal function. The left ventricle has no regional  wall motion abnormalities. Left ventricular diastolic parameters are  consistent with Grade I diastolic  dysfunction (impaired relaxation).   2. Right ventricular systolic function is normal. The right ventricular  size is normal. There is normal pulmonary artery systolic pressure. The  estimated right ventricular systolic pressure is 12.2 mmHg.   3. The aortic valve was not well visualized. __________   Carlton Adam MPI 01/11/2020: Normal pharmacologic myocardial  perfusion stress test without evidence of significant ischemia or scar. The left ventricular ejection fraction is hyperdynamic (>65%). Left bundle branch block developed after administration of regadenoson. Patient known to have intermittent LBBB. Attenuation correction CT is notable for aortic atherosclerosis. There is no significant coronary artery calcification. This is a low risk study. __________   Elwyn Reach patch 12/2019: Normal sinus rhythm avg HR of 65 bpm.    Patient triggered events (6) were not associated with significant arrhythmia.   First Degree AV Block was present.  18 Supraventricular Tachycardia runs occurred, the run with the fastest interval lasting 4 beats with a max rate of 158 bpm, the longest lasting 10 beats with an avg rate of 109 bpm.   Isolated SVEs were rare (<1.0%), SVE Couplets were rare (<1.0%), and SVE Triplets were rare (<1.0%). Isolated VEs were rare (<1.0%), and no VE Couplets or VE Triplets were present. __________   2D echo 03/2019: 1. Left ventricular ejection fraction, by visual estimation, is 55 to  60%. The left ventricle has normal function. There is no left ventricular  hypertrophy.   2. Left ventricular diastolic parameters are consistent with Grade II  diastolic dysfunction (pseudonormalization).   3. The left ventricle has no regional wall motion abnormalities.   4. Global right ventricle has normal systolic function.The right  ventricular size is normal. No increase in right ventricular wall  thickness.   5. Left atrial size was normal.   6. Mildly elevated pulmonary artery systolic pressure.   EKG:  EKG is ordered today.  The EKG ordered today demonstrates sinus bradycardia, 59 bpm, LBBB (known)  Recent Labs: 04/18/2021: ALT 31; BUN 16; Creat  0.90; Potassium 5.0; Sodium 141; TSH 3.92 08/03/2021: Hemoglobin 13.4; Platelets 254  Recent Lipid Panel    Component Value Date/Time   CHOL 142 04/18/2021 1423   CHOL 155 05/08/2015 0927   TRIG  194 (H) 04/18/2021 1423   HDL 34 (L) 04/18/2021 1423   HDL 30 (L) 05/08/2015 0927   CHOLHDL 4.2 04/18/2021 1423   LDLCALC 79 04/18/2021 1423    PHYSICAL EXAM:    VS:  BP 124/62   Pulse (!) 59   Ht '5\' 11"'$  (1.803 m)   Wt 171 lb (77.6 kg)   SpO2 95%   BMI 23.85 kg/m   BMI: Body mass index is 23.85 kg/m.  Physical Exam Vitals reviewed.  Constitutional:      Appearance: She is well-developed.  HENT:     Head: Normocephalic and atraumatic.  Eyes:     General:        Right eye: No discharge.        Left eye: No discharge.  Neck:     Vascular: No JVD.  Cardiovascular:     Rate and Rhythm: Normal rate and regular rhythm.     Pulses:          Posterior tibial pulses are 2+ on the right side and 2+ on the left side.     Heart sounds: Normal heart sounds, S1 normal and S2 normal. Heart sounds not distant. No midsystolic click and no opening snap. No murmur heard.    No friction rub.  Pulmonary:     Effort: Pulmonary effort is normal. No respiratory distress.     Breath sounds: Normal breath sounds. No decreased breath sounds, wheezing or rales.  Chest:     Chest wall: No tenderness.  Abdominal:     General: There is no distension.     Palpations: Abdomen is soft.     Tenderness: There is no abdominal tenderness.  Musculoskeletal:     Cervical back: Normal range of motion.     Right lower leg: No edema.     Left lower leg: No edema.  Skin:    General: Skin is warm and dry.     Nails: There is no clubbing.  Neurological:     Mental Status: She is alert and oriented to person, place, and time.  Psychiatric:        Speech: Speech normal.        Behavior: Behavior normal.        Thought Content: Thought content normal.        Judgment: Judgment normal.     Wt Readings from Last 3 Encounters:  08/03/21 171 lb (77.6 kg)  08/01/21 172 lb 12.8 oz (78.4 kg)  06/21/21 172 lb (78 kg)     ASSESSMENT & PLAN:   CAD involving the native coronary arteries without angina: She  is doing well without symptoms concerning for angina.  Recent Lexiscan MPI showed no evidence of ischemia.  Continue aggressive risk factor modification and primary prevention including aspirin, atorvastatin, ezetimibe, and metoprolol succinate.  PAF/PSVT: Symptoms of palpitations resolved following initiation of Toprol-XL, which will be continued.  Given CHA2DS2-VASc of at least 3 she remains on apixaban without symptoms concerning for bleeding.  Check BMP and CBC.  Fatigue: This appears to be her largest complaint at this time.  She notes this has been present over the past she has been under increased stress as her son recently underwent a stem cell transplant with a donor being her daughter.  I wonder if this is contributing to her fatigue.  Nonetheless, we will check an echo to evaluate for any new structural abnormalities given underlying dyspnea, that is largely stable, along with a CBC, BMP, and TSH.  STOP-BANG score of 2, she does not qualify for Watch-PAT sleep study at this time.   Diastolic dysfunction: She appears euvolemic and well compensated.  Blood pressure is under better control in the office today.  Not requiring a standing diuretic.  Continue optimal blood pressure control.  LBBB: Echo demonstrated preserved LV systolic function with Lexiscan MPI in 2021 showing no evidence of ischemia.  No syncope.  Stable.  Continue to monitor.  HLD: LDL 79.  She remains on atorvastatin 40 mg and ezetimibe.  Carotid artery disease: Status post left-sided CEA in 08/2014.  Most recent carotid artery ultrasound from earlier this month demonstrated bilateral 1 to 39% stenoses.  She remains on aspirin and atorvastatin.  Follow-up with vascular surgery as directed.  COPD: Stable at her last visit with pulmonology earlier this week.  Follow up as directed.    Disposition: F/u with Dr. Rockey Walters or an APP in 2 months.   Medication Adjustments/Labs and Tests Ordered: Current medicines are reviewed at  length with the patient today.  Concerns regarding medicines are outlined above. Medication changes, Labs and Tests ordered today are summarized above and listed in the Patient Instructions accessible in Encounters.   Signed, Christell Faith, PA-C 08/03/2021 2:36 PM     [facility] [address] [place], [place] [address] [phone]

## 2021-08-03 ENCOUNTER — Encounter: Payer: Self-pay | Admitting: Physician Assistant

## 2021-08-03 ENCOUNTER — Other Ambulatory Visit
Admission: RE | Admit: 2021-08-03 | Discharge: 2021-08-03 | Disposition: A | Discharge status: Home / Self Care | Payer: Medicare Other | Source: Ambulatory Visit | Attending: Physician Assistant | Admitting: Physician Assistant

## 2021-08-03 ENCOUNTER — Ambulatory Visit (INDEPENDENT_AMBULATORY_CARE_PROVIDER_SITE_OTHER): Payer: Medicare Other | Admitting: Physician Assistant

## 2021-08-03 ENCOUNTER — Ambulatory Visit: Payer: Medicare Other | Admitting: Family Medicine

## 2021-08-03 VITALS — BP 124/62 | HR 59 | Ht 71.0 in | Wt 171.0 lb

## 2021-08-03 DIAGNOSIS — I447 Left bundle-branch block, unspecified: Secondary | ICD-10-CM | POA: Insufficient documentation

## 2021-08-03 DIAGNOSIS — R5383 Other fatigue: Secondary | ICD-10-CM | POA: Insufficient documentation

## 2021-08-03 DIAGNOSIS — I6523 Occlusion and stenosis of bilateral carotid arteries: Secondary | ICD-10-CM | POA: Diagnosis present

## 2021-08-03 DIAGNOSIS — I5189 Other ill-defined heart diseases: Secondary | ICD-10-CM | POA: Insufficient documentation

## 2021-08-03 DIAGNOSIS — R0602 Shortness of breath: Secondary | ICD-10-CM

## 2021-08-03 DIAGNOSIS — I471 Supraventricular tachycardia, unspecified: Secondary | ICD-10-CM

## 2021-08-03 DIAGNOSIS — I2510 Atherosclerotic heart disease of native coronary artery without angina pectoris: Secondary | ICD-10-CM

## 2021-08-03 DIAGNOSIS — I480 Paroxysmal atrial fibrillation: Secondary | ICD-10-CM | POA: Insufficient documentation

## 2021-08-03 DIAGNOSIS — E785 Hyperlipidemia, unspecified: Secondary | ICD-10-CM | POA: Insufficient documentation

## 2021-08-03 LAB — BASIC METABOLIC PANEL
Anion gap: 7 (ref 5–15)
BUN: 17 mg/dL (ref 8–23)
CO2: 24 mmol/L (ref 22–32)
Calcium: 9.3 mg/dL (ref 8.9–10.3)
Chloride: 109 mmol/L (ref 98–111)
Creatinine, Ser: 0.92 mg/dL (ref 0.44–1.00)
GFR, Estimated: >60 mL/min (ref >60)
Glucose, Bld: 95 mg/dL (ref 70–99)
Potassium: 4 mmol/L (ref 3.5–5.1)
Sodium: 140 mmol/L (ref 135–145)

## 2021-08-03 LAB — CBC
HCT: 40.6 % (ref 36.0–46.0)
Hemoglobin: 13.4 g/dL (ref 12.0–15.0)
MCH: 28.5 pg (ref 26.0–34.0)
MCHC: 33 g/dL (ref 30.0–36.0)
MCV: 86.4 fL (ref 80.0–100.0)
Platelets: 254 10*3/uL (ref 150–400)
RBC: 4.7 MIL/uL (ref 3.87–5.11)
RDW: 14.3 % (ref 11.5–15.5)
WBC: 8.8 10*3/uL (ref 4.0–10.5)
nRBC: 0 % (ref 0.0–0.2)

## 2021-08-03 LAB — TSH: TSH: 3.494 u[IU]/mL (ref 0.350–4.500)

## 2021-08-03 NOTE — Patient Instructions (Signed)
Medication Instructions:  No changes at this time.   *If you need a refill on your cardiac medications before your next appointment, please call your pharmacy*   Lab Work: CBC, BMET & TSH today over at the [place] at [facility] then go to 1st desk on the right to check in (REGISTRATION)  Lab hours: Monday- Friday (7:30 am- 5:30 pm)  If you have labs (blood work) drawn today and your tests are completely normal, you will receive your results only by: [place] (if you have MyChart) OR A paper copy in the mail If you have any lab test that is abnormal or we need to change your treatment, we will call you to review the results.   Testing/Procedures: Your physician has requested that you have an echocardiogram. Echocardiography is a painless test that uses sound waves to create images of your heart. It provides your doctor with information about the size and shape of your heart and how well your heart's chambers and valves are working. This procedure takes approximately one hour. There are no restrictions for this procedure.    Follow-Up: At [facility], you and your health needs are our priority.  As part of our continuing mission to provide you with exceptional heart care, we have created designated Provider Care Teams.  These Care Teams include your primary Cardiologist (physician) and Advanced Practice Providers (APPs -  Physician Assistants and Nurse Practitioners) who all work together to provide you with the care you need, when you need it.  We recommend signing up for the patient portal called "MyChart".  Sign up information is provided on this After Visit Summary.  MyChart is used to connect with patients for Virtual Visits (Telemedicine).  Patients are able to view lab/test results, encounter notes, upcoming appointments, etc.  Non-urgent messages can be sent to your provider as well.   To learn more about what you can do with MyChart, go to [email or web address].     Your next appointment:   2 month(s)  The format for your next appointment:   In Person  Provider:   Ida Rogue, MD or Christell Faith, PA-C      Important Information About Sugar

## 2021-08-09 ENCOUNTER — Encounter: Payer: Self-pay | Admitting: Physician Assistant

## 2021-08-09 ENCOUNTER — Ambulatory Visit (INDEPENDENT_AMBULATORY_CARE_PROVIDER_SITE_OTHER): Payer: Medicare Other | Admitting: Physician Assistant

## 2021-08-09 ENCOUNTER — Other Ambulatory Visit: Payer: Self-pay

## 2021-08-09 VITALS — BP 124/82 | HR 82 | Temp 97.7°F | Resp 16 | Ht 71.0 in | Wt 172.6 lb

## 2021-08-09 DIAGNOSIS — M810 Age-related osteoporosis without current pathological fracture: Secondary | ICD-10-CM

## 2021-08-09 DIAGNOSIS — Z1283 Encounter for screening for malignant neoplasm of skin: Secondary | ICD-10-CM | POA: Diagnosis not present

## 2021-08-09 DIAGNOSIS — Z1231 Encounter for screening mammogram for malignant neoplasm of breast: Secondary | ICD-10-CM | POA: Diagnosis not present

## 2021-08-09 DIAGNOSIS — Z0000 Encounter for general adult medical examination without abnormal findings: Secondary | ICD-10-CM

## 2021-08-09 NOTE — Patient Instructions (Addendum)
Thank you for taking time to come for your Medicare Wellness Visit. I appreciate your ongoing commitment to your health goals. Please review the following plan we discussed and let me know if I can assist you in the future.   Screening recommendations/referrals: Colonoscopy: No longer indicated  Mammogram: Due May 2023.  Bone Density: Due every 2 years for monitoring. Order placed today as the last one was in 2019 Recommended yearly ophthalmology/optometry visit for glaucoma screening and checkup Recommended yearly dental visit for hygiene and checkup  Vaccinations: Influenza vaccine: Due in the fall  Pneumococcal vaccine: Completed Tdap vaccine: up to date Shingles vaccine: Had reaction to first shot. Not indicated due to allergy.    Covid-19:Up to date  I have placed a referral to Dermatology for you to resume your annual skin cancer screenings. The referral team should call you soon to help set this up.   Next appointment: Follow up in one year for your annual wellness visit    Preventive Care 76 Years and Older, Female Preventive care refers to lifestyle choices and visits with your health care provider that can promote health and wellness. What does preventive care include? A yearly physical exam. This is also called an annual well check. Dental exams once or twice a year. Routine eye exams. Ask your health care provider how often you should have your eyes checked. Personal lifestyle choices, including: Daily care of your teeth and gums. Regular physical activity. Eating a healthy diet. Avoiding tobacco and drug use. Limiting alcohol use. Practicing safe sex. Taking low-dose aspirin every day. Taking vitamin and mineral supplements as recommended by your health care provider. What happens during an annual well check? The services and screenings done by your health care provider during your annual well check will depend on your age, overall health, lifestyle risk factors,  and family history of disease. Counseling  Your health care provider may ask you questions about your: Alcohol use. Tobacco use. Drug use. Emotional well-being. Home and relationship well-being. Sexual activity. Eating habits. History of falls. Memory and ability to understand (cognition). Work and work Statistician. Reproductive health. Screening  You may have the following tests or measurements: Height, weight, and BMI. Blood pressure. Lipid and cholesterol levels. These may be checked every 5 years, or more frequently if you are over 45 years old. Skin check. Lung cancer screening. You may have this screening every year starting at age 62 if you have a 30-pack-year history of smoking and currently smoke or have quit within the past 15 years. Fecal occult blood test (FOBT) of the stool. You may have this test every year starting at age 69. Flexible sigmoidoscopy or colonoscopy. You may have a sigmoidoscopy every 5 years or a colonoscopy every 10 years starting at age 85. Hepatitis C blood test. Hepatitis B blood test. Sexually transmitted disease (STD) testing. Diabetes screening. This is done by checking your blood sugar (glucose) after you have not eaten for a while (fasting). You may have this done every 1-3 years. Bone density scan. This is done to screen for osteoporosis. You may have this done starting at age 50. Mammogram. This may be done every 1-2 years. Talk to your health care provider about how often you should have regular mammograms. Talk with your health care provider about your test results, treatment options, and if necessary, the need for more tests. Vaccines  Your health care provider may recommend certain vaccines, such as: Influenza vaccine. This is recommended every year. Tetanus, diphtheria, and  acellular pertussis (Tdap, Td) vaccine. You may need a Td booster every 10 years. Zoster vaccine. You may need this after age 79. Pneumococcal 13-valent conjugate  (PCV13) vaccine. One dose is recommended after age 37. Pneumococcal polysaccharide (PPSV23) vaccine. One dose is recommended after age 58. Talk to your health care provider about which screenings and vaccines you need and how often you need them. This information is not intended to replace advice given to you by your health care provider. Make sure you discuss any questions you have with your health care provider. Document Released: 03/03/2015 Document Revised: 10/25/2015 Document Reviewed: 12/06/2014 Elsevier Interactive Patient Education  2017 [place] Prevention in the Home Falls can cause injuries. They can happen to people of all ages. There are many things you can do to make your home safe and to help prevent falls. What can I do on the outside of my home? Regularly fix the edges of walkways and driveways and fix any cracks. Remove anything that might make you trip as you walk through a door, such as a raised step or threshold. Trim any bushes or trees on the path to your home. Use bright outdoor lighting. Clear any walking paths of anything that might make someone trip, such as rocks or tools. Regularly check to see if handrails are loose or broken. Make sure that both sides of any steps have handrails. Any raised decks and porches should have guardrails on the edges. Have any leaves, snow, or ice cleared regularly. Use sand or salt on walking paths during winter. Clean up any spills in your garage right away. This includes oil or grease spills. What can I do in the bathroom? Use night lights. Install grab bars by the toilet and in the tub and shower. Do not use towel bars as grab bars. Use non-skid mats or decals in the tub or shower. If you need to sit down in the shower, use a plastic, non-slip stool. Keep the floor dry. Clean up any water that spills on the floor as soon as it happens. Remove soap buildup in the tub or shower regularly. Attach bath mats securely with  double-sided non-slip rug tape. Do not have throw rugs and other things on the floor that can make you trip. What can I do in the bedroom? Use night lights. Make sure that you have a light by your bed that is easy to reach. Do not use any sheets or blankets that are too big for your bed. They should not hang down onto the floor. Have a firm chair that has side arms. You can use this for support while you get dressed. Do not have throw rugs and other things on the floor that can make you trip. What can I do in the kitchen? Clean up any spills right away. Avoid walking on wet floors. Keep items that you use a lot in easy-to-reach places. If you need to reach something above you, use a strong step stool that has a grab bar. Keep electrical cords out of the way. Do not use floor polish or wax that makes floors slippery. If you must use wax, use non-skid floor wax. Do not have throw rugs and other things on the floor that can make you trip. What can I do with my stairs? Do not leave any items on the stairs. Make sure that there are handrails on both sides of the stairs and use them. Fix handrails that are broken or loose. Make sure that  handrails are as long as the stairways. Check any carpeting to make sure that it is firmly attached to the stairs. Fix any carpet that is loose or worn. Avoid having throw rugs at the top or bottom of the stairs. If you do have throw rugs, attach them to the floor with carpet tape. Make sure that you have a light switch at the top of the stairs and the bottom of the stairs. If you do not have them, ask someone to add them for you. What else can I do to help prevent falls? Wear shoes that: Do not have high heels. Have rubber bottoms. Are comfortable and fit you well. Are closed at the toe. Do not wear sandals. If you use a stepladder: Make sure that it is fully opened. Do not climb a closed stepladder. Make sure that both sides of the stepladder are locked  into place. Ask someone to hold it for you, if possible. Clearly mark and make sure that you can see: Any grab bars or handrails. First and last steps. Where the edge of each step is. Use tools that help you move around (mobility aids) if they are needed. These include: Canes. Walkers. Scooters. Crutches. Turn on the lights when you go into a dark area. Replace any light bulbs as soon as they burn out. Set up your furniture so you have a clear path. Avoid moving your furniture around. If any of your floors are uneven, fix them. If there are any pets around you, be aware of where they are. Review your medicines with your doctor. Some medicines can make you feel dizzy. This can increase your chance of falling. Ask your doctor what other things that you can do to help prevent falls. This information is not intended to replace advice given to you by your health care provider. Make sure you discuss any questions you have with your health care provider. Document Released: 12/01/2008 Document Revised: 07/13/2015 Document Reviewed: 03/11/2014 Elsevier Interactive Patient Education  2017 [employer].

## 2021-08-09 NOTE — Progress Notes (Signed)
Patient: Kristie Walters, Female    DOB: 07/08/1945, 76 y.o.   MRN: [id number]  Visit Date: 08/09/2021  Today's Provider: Oswaldo Conroy Lisvet Rasheed, PA-C  Introduced myself to the patient as a PA-C and provided education on APPs in clinical practice.    Chief Complaint  Patient presents with   Medicare Wellness    Subjective:    HPI Kristie Walters is a 76 y.o. female who presents today for her Subsequent Annual Wellness Visit.  Reports dysuria, hesitancy and urgency- she has an apt with PCP coming up  Review of Systems  Constitutional:  Positive for fatigue. Negative for chills, diaphoresis and fever.  HENT:  Negative for sore throat and voice change.   Eyes:  Negative for visual disturbance.  Respiratory:  Positive for shortness of breath. Negative for cough and wheezing.   Cardiovascular:  Positive for leg swelling. Negative for chest pain and palpitations.  Gastrointestinal:  Negative for blood in stool, diarrhea, nausea and vomiting.  Endocrine: Negative for polydipsia, polyphagia and polyuria.  Genitourinary:  Positive for difficulty urinating, dysuria and urgency.  Neurological:  Negative for dizziness, tremors, facial asymmetry, weakness, light-headedness, numbness and headaches.  Psychiatric/Behavioral:  Negative for confusion, dysphoric mood, sleep disturbance and suicidal ideas. The patient is not nervous/anxious.       Past Medical History:  Diagnosis Date   Allergy    Atrial fibrillation (HCC)    Basal cell carcinoma of nose 08/12/2017   Carotid artery disease (HCC)    s/p L. CEA in July 2016   Centrilobular emphysema (HCC) 09/29/2017   Collagen vascular disease (HCC)    Left carotid stenosis   COPD (chronic obstructive pulmonary disease) (HCC)    Coronary artery disease 09/29/2017   Noted on chest CT July 2019   Dysrhythmia    Elevated blood pressure (not hypertension)    Fatty liver 09/29/2017   Chest CT July 2019   GERD (gastroesophageal reflux disease)     Hyperlipidemia    Personal history of tobacco use, presenting hazards to health 08/31/2015   PONV (postoperative nausea and vomiting)     Past Surgical History:  Procedure Laterality Date   BREAST BIOPSY     COLONOSCOPY WITH PROPOFOL N/A 08/22/2020   Procedure: COLONOSCOPY WITH PROPOFOL;  Surgeon: Midge Minium, MD;  Location: [facility] ENDOSCOPY;  Service: Endoscopy;  Laterality: N/A;   ENDARTERECTOMY Left 08/25/2014   Procedure: ENDARTERECTOMY CAROTID;  Surgeon: Annice Needy, MD;  Location: [facility] ORS;  Service: Vascular;  Laterality: Left;   MOHS SURGERY     MOHS SURGERY  09/23/2017   nose   PERIPHERAL VASCULAR CATHETERIZATION N/A 07/20/2014   Procedure: Carotid Angiography;  Surgeon: Annice Needy, MD;  Location: [facility] INVASIVE CV LAB;  Service: Cardiovascular;  Laterality: N/A;   TONSILLECTOMY     TUBAL LIGATION      Family History  Problem Relation Age of Onset   Heart attack Mother    Hypercholesterolemia Mother    Hypertension Mother    Peripheral vascular disease Mother    Dementia Mother    Hypothyroidism Mother    CVA Father    Liver cancer Father    Diabetes Brother    Kidney cancer Sister    Diabetes Brother    Alzheimer's disease Brother    Other Brother        alzheimers   Lymphoma Son    HIV Son    Breast cancer Neg Hx     Social History   Socioeconomic  History   Marital status: Divorced    Spouse name: Not on file   Number of children: 2   Years of education: Not on file   Highest education level: 10th grade  Occupational History   Occupation: Retired  Tobacco Use   Smoking status: Former    Packs/day: 1.00    Years: 55.00    Total pack years: 55.00    Types: Cigarettes    Quit date: 11/26/2016    Years since quitting: 4.7   Smokeless tobacco: Former    Types: Snuff   Tobacco comments:    smoking cessation materials not required  Vaping Use   Vaping Use: Never used  Substance and Sexual Activity   Alcohol use: No   Drug use: No   Sexual activity: Not  Currently  Other Topics Concern   Not on file  Social History Narrative    Pt lives alone   Social Determinants of Health   Financial Resource Strain: Medium Risk (08/09/2021)   Overall Financial Resource Strain (CARDIA)    Difficulty of Paying Living Expenses: Somewhat hard  Food Insecurity: No Food Insecurity (08/09/2021)   Hunger Vital Sign    Worried About Running Out of Food in the Last Year: Never true    Ran Out of Food in the Last Year: Never true  Transportation Needs: No Transportation Needs (08/09/2021)   PRAPARE - Administrator, Civil Service (Medical): No    Lack of Transportation (Non-Medical): No  Physical Activity: Insufficiently Active (08/09/2021)   Exercise Vital Sign    Days of Exercise per Week: 1 day    Minutes of Exercise per Session: 10 min  Stress: No Stress Concern Present (08/09/2021)   [employer] of Occupational Health - Occupational Stress Questionnaire    Feeling of Stress : Only a little  Social Connections: Moderately Isolated (08/08/2020)   Social Connection and Isolation Panel [NHANES]    Frequency of Communication with Friends and Family: More than three times a week    Frequency of Social Gatherings with Friends and Family: Three times a week    Attends Religious Services: More than 4 times per year    Active Member of Clubs or Organizations: No    Attends Banker Meetings: Never    Marital Status: Divorced  Catering manager Violence: Not At Risk (08/09/2021)   Humiliation, Afraid, Rape, and Kick questionnaire    Fear of Current or Ex-Partner: No    Emotionally Abused: No    Physically Abused: No    Sexually Abused: No    Outpatient Encounter Medications as of 08/09/2021  Medication Sig   albuterol (VENTOLIN HFA) 108 (90 Base) MCG/ACT inhaler Inhale 2 puffs into the lungs every 6 (six) hours as needed for wheezing or shortness of breath.   aspirin 81 MG tablet Take 81 mg by mouth daily.   atorvastatin (LIPITOR)  40 MG tablet TAKE 1 TABLET BY MOUTH EVERY DAY   calcium carbonate (OS-CAL - DOSED IN MG OF ELEMENTAL CALCIUM) 1250 (500 Ca) MG tablet Take 1 tablet by mouth.   cholecalciferol (VITAMIN D) 1000 units tablet Take 1,000 Units by mouth daily.   Cyanocobalamin (VITAMIN B-12) 5000 MCG SUBL Place under the tongue.   ELIQUIS 5 MG TABS tablet TAKE 1 TABLET BY MOUTH TWICE A DAY   ezetimibe (ZETIA) 10 MG tablet TAKE 1 TABLET BY MOUTH EVERY DAY   Fish Oil-Cholecalciferol (FISH OIL + D3 PO) Take 1,000 mg by mouth 1 day  or 1 dose.   fluticasone (FLONASE) 50 MCG/ACT nasal spray Place 2 sprays into both nostrils daily.   metoprolol succinate (TOPROL XL) 25 MG 24 hr tablet Take 1 tablet (25 mg total) by mouth daily.   omeprazole (PRILOSEC) 40 MG capsule TAKE 1 CAPSULE (40 MG TOTAL) BY MOUTH AS NEEDED   pregabalin (LYRICA) 50 MG capsule Take 1 capsule (50 mg total) by mouth 3 (three) times daily.   TRELEGY ELLIPTA 100-62.5-25 MCG/ACT AEPB TAKE 1 PUFF BY MOUTH EVERY DAY   No facility-administered encounter medications on file as of 08/09/2021.    Allergies  Allergen Reactions   Morphine Nausea Only and Nausea And Vomiting   Morphine And Related Nausea And Vomiting   Zoster Vaccine Live Itching and Rash    Care Team Updated in EHR: Yes  Last Vision Exam: About 2 years ago Wears corrective lenses: Yes Last Dental Exam: Has not been regularly due to dentures.  Last Hearing Exam: Unsure  Wears Hearing Aids: No  Functional Ability / Safety Screening 1.  Was the timed Get Up and Go test shorter than 30 seconds?  yes 2.  Does the patient need help with the phone, transportation, shopping,      preparing meals, housework, laundry, medications, or managing money?  no 3.  Is the patient's home free of loose throw rugs in walkways, pet beds, electrical cords, etc?   no      Grab bars in the bathroom? yes      Handrails on the stairs?   Yes      Adequate lighting?   yes 4.  Has the patient noticed any hearing  difficulties?   yes  Diet Recall and Exercise Regimen:  Diet: normal diet Exercise: does not participate in regular physical activity .   Advanced Care Planning: A voluntary discussion about advance care planning including the explanation and discussion of advance directives.  Discussed health care proxy and Living will, and the patient was able to identify a health care proxy as her daughter.  Patient does not have a living will at present time. If patient does have living will, I have requested they bring this to the clinic to be scanned in to their chart. Does patient have a HCPOA?    yes If yes, name and contact information: Murrell Converse, her daughter Does patient have a living will or MOST form?  no  Cancer Screenings: Skin: She was seeing Dermatology for regular visits. States its been about 5 years since she was last seen.  Lung: Performed in Feb 2023. Low Dose CT Chest recommended if Age 85-80 years, 30 pack-year currently smoking OR have quit w/in 15years. Patient does qualify. Breast:  Up to date on Mammogram? No  Order placed today.  Up to date of Bone Density/Dexa? No last performed in 2019- order placed today.  Colon: No longer indicated for screening   Additional Screenings:  Hepatitis B/HIV/Syphillis: Hepatitis C Screening: Completed Intimate Partner Violence:   Objective:   Vitals: BP 124/82   Pulse 82   Temp 97.7 F (36.5 C) (Oral)   Resp 16   Ht 5\' 11"  (1.803 m)   Wt 172 lb 9.6 oz (78.3 kg)   SpO2 95%   BMI 24.07 kg/m  Body mass index is 24.07 kg/m.  No results found.  Physical Exam Vitals reviewed.  Constitutional:      General: She is awake.     Appearance: Normal appearance. She is well-developed, well-groomed and normal weight.  HENT:     Head: Normocephalic and atraumatic.  Pulmonary:     Effort: Pulmonary effort is normal.  Musculoskeletal:     Cervical back: Normal range of motion.  Neurological:     General: No focal deficit present.      Mental Status: She is alert and oriented to person, place, and time.     GCS: GCS eye subscore is 4. GCS verbal subscore is 5. GCS motor subscore is 6.     Cranial Nerves: No dysarthria or facial asymmetry.     Motor: No tremor.     Comments: Patient walks with cane   Psychiatric:        Attention and Perception: Attention and perception normal.        Mood and Affect: Mood and affect normal.        Speech: Speech normal.        Behavior: Behavior normal. Behavior is cooperative.      Cognitive Testing - 6-CIT  Correct? Score   What year is it? yes 0 Yes = 0    No = 4  What month is it? yes 0 Yes = 0    No = 3  Remember:     Floy[facility] Parkins, [address], [place]     What time is it? yes 0 Yes = 0    No = 3  Count backwards from 20 to 1 yes 0 Correct = 0    1 error = 2   More than 1 error = 4  Say the months of the year in reverse. yes 0 Correct = 0    1 error = 2   More than 1 error = 4  What address did I ask you to remember? yes 0 Correct = 0  1 error = 2    2 error = 4    3 error = 6    4 error = 8    All wrong = 10       TOTAL SCORE  0/28   Interpretation:  Normal  Normal (0-7) Abnormal (8-28)   Fall Risk:    08/09/2021    9:23 AM 04/18/2021    1:29 PM 01/15/2021    1:56 PM 09/29/2020    1:44 PM 08/08/2020    9:37 AM  Fall Risk   Falls in the past year? 0 0 0 0 0  Number falls in past yr: 0 0 0 0 0  Injury with Fall? 0 0 0 0 0  Risk for fall due to :  No Fall Risks No Fall Risks  No Fall Risks  Follow up Falls evaluation completed Education provided Falls prevention discussed Falls evaluation completed Falls prevention discussed    Depression Screen    08/09/2021    9:23 AM 04/18/2021    1:29 PM 01/15/2021    1:57 PM 09/29/2020    1:43 PM 08/08/2020    9:35 AM  Depression screen PHQ 2/9  Decreased Interest 0 0 0 0 0  Down, Depressed, Hopeless 0 0 0 0 0  PHQ - 2 Score 0 0 0 0 0  Altered sleeping  0 1    Tired, decreased energy  0 1    Change in appetite  0 0     Feeling bad or failure about yourself   0 0    Trouble concentrating  0 0    Moving slowly or fidgety/restless  0 0    Suicidal thoughts  0 0    PHQ-9 Score  0 2    Difficult doing work/chores  Not difficult at all       Recent Results (from the past 2160 hour(s))  CBC     Status: None   Collection Time: 08/03/21  2:21 PM  Result Value Ref Range   WBC 8.8 4.0 - 10.5 K/uL   RBC 4.70 3.87 - 5.11 MIL/uL   Hemoglobin 13.4 12.0 - 15.0 g/dL   HCT 40.9 81.1 - 91.4 %   MCV 86.4 80.0 - 100.0 fL   MCH 28.5 26.0 - 34.0 pg   MCHC 33.0 30.0 - 36.0 g/dL   RDW 78.2 95.6 - 21.3 %   Platelets 254 150 - 400 K/uL   nRBC 0.0 0.0 - 0.2 %    Comment: Performed at [facility], [address] [place], [place] [address]  Basic metabolic panel     Status: None   Collection Time: 08/03/21  2:21 PM  Result Value Ref Range   Sodium 140 135 - 145 mmol/L   Potassium 4.0 3.5 - 5.1 mmol/L   Chloride 109 98 - 111 mmol/L   CO2 24 22 - 32 mmol/L   Glucose, Bld 95 70 - 99 mg/dL    Comment: Glucose reference range applies only to samples taken after fasting for at least 8 hours.   BUN 17 8 - 23 mg/dL   Creatinine, Ser 8.46 0.44 - 1.00 mg/dL   Calcium 9.3 8.9 - 96.2 mg/dL   GFR, Estimated >95 >28 mL/min    Comment: (NOTE) Calculated using the CKD-EPI Creatinine Equation (2021)    Anion gap 7 5 - 15    Comment: Performed at [facility], [address] [place], [place] [address]  TSH     Status: None   Collection Time: 08/03/21  2:21 PM  Result Value Ref Range   TSH 3.494 0.350 - 4.500 uIU/mL    Comment: Performed by a 3rd Generation assay with a functional sensitivity of <=0.01 uIU/mL. Performed at [facility], [address], [place], [place] [address]     Assessment & Plan:    Encounter for Medicare annual wellness exam  Breast cancer screening by mammogram  Postmenopausal osteoporosis  Skin cancer screening   Exercise Activities and Dietary  recommendations  Goals       DIET - INCREASE WATER INTAKE      Recommend to drink at least 6-8 8oz glasses of water per day.      Exercise 3x per week (30 min per time)      Recommend walking 3 times per week for at least 30 minutes      Patient Stated (pt-stated)      She would like to see her son better from his cancer diagnosis.        - Discussed health benefits of physical activity, and encouraged her to engage in regular exercise appropriate for her age and condition.   Immunization History  Administered Date(s) Administered   Influenza, High Dose Seasonal PF 10/08/2016, 10/01/2018, 10/08/2019, 11/01/2020   Influenza-Unspecified 11/19/2014, 11/16/2015, 10/08/2016, 09/18/2017   Moderna Covid-19 Vaccine Bivalent Booster 75yrs & up 11/15/2020   Moderna Sars-Covid-2 Vaccination 01/05/2020, 07/27/2020   PFIZER(Purple Top)SARS-COV-2 Vaccination 04/21/2019, 05/12/2019   Pneumococcal Conjugate-13 11/18/2016   Pneumococcal Polysaccharide-23 06/11/2012, 06/01/2020   Td 02/18/2009   Tdap 04/25/2021   Zoster Recombinat (Shingrix) 04/25/2021    Health Maintenance  Topic Date Due   MAMMOGRAM  07/05/2021  Zoster Vaccines- Shingrix (2 of 2) 11/09/2021 (Originally 06/20/2021)   INFLUENZA VACCINE  09/18/2021   TETANUS/TDAP  04/26/2031   Pneumonia Vaccine 58+ Years old  Completed   DEXA SCAN  Completed   COVID-19 Vaccine  Completed   Hepatitis C Screening  Completed   HPV VACCINES  Aged Out   COLONOSCOPY (Pts 45-70yrs Insurance coverage will need to be confirmed)  Discontinued    No orders of the defined types were placed in this encounter.   Current Outpatient Medications:    albuterol (VENTOLIN HFA) 108 (90 Base) MCG/ACT inhaler, Inhale 2 puffs into the lungs every 6 (six) hours as needed for wheezing or shortness of breath., Disp: 8 g, Rfl: 2   aspirin 81 MG tablet, Take 81 mg by mouth daily., Disp: , Rfl:    atorvastatin (LIPITOR) 40 MG tablet, TAKE 1 TABLET BY MOUTH EVERY  DAY, Disp: 90 tablet, Rfl: 3   calcium carbonate (OS-CAL - DOSED IN MG OF ELEMENTAL CALCIUM) 1250 (500 Ca) MG tablet, Take 1 tablet by mouth., Disp: , Rfl:    cholecalciferol (VITAMIN D) 1000 units tablet, Take 1,000 Units by mouth daily., Disp: , Rfl:    Cyanocobalamin (VITAMIN B-12) 5000 MCG SUBL, Place under the tongue., Disp: , Rfl:    ELIQUIS 5 MG TABS tablet, TAKE 1 TABLET BY MOUTH TWICE A DAY, Disp: 180 tablet, Rfl: 1   ezetimibe (ZETIA) 10 MG tablet, TAKE 1 TABLET BY MOUTH EVERY DAY, Disp: 90 tablet, Rfl: 3   Fish Oil-Cholecalciferol (FISH OIL + D3 PO), Take 1,000 mg by mouth 1 day or 1 dose., Disp: , Rfl:    fluticasone (FLONASE) 50 MCG/ACT nasal spray, Place 2 sprays into both nostrils daily., Disp: 16 g, Rfl: 6   metoprolol succinate (TOPROL XL) 25 MG 24 hr tablet, Take 1 tablet (25 mg total) by mouth daily., Disp: 90 tablet, Rfl: 3   omeprazole (PRILOSEC) 40 MG capsule, TAKE 1 CAPSULE (40 MG TOTAL) BY MOUTH AS NEEDED, Disp: 90 capsule, Rfl: 0   pregabalin (LYRICA) 50 MG capsule, Take 1 capsule (50 mg total) by mouth 3 (three) times daily., Disp: 270 capsule, Rfl: 1   TRELEGY ELLIPTA 100-62.5-25 MCG/ACT AEPB, TAKE 1 PUFF BY MOUTH EVERY DAY, Disp: 60 each, Rfl: 11 There are no discontinued medications.  I have personally reviewed and addressed the Medicare Annual Wellness health risk assessment questionnaire and have noted the following in the patient's chart:  A.         Medical and social history & family history B.         Use of alcohol, tobacco, and illicit drugs  C.         Current medications and supplements D.         Functional and Cognitive ability and status E.         Nutritional status F.         Physical activity G.        Advance directives H.         List of other physicians I.          Hospitalizations, surgeries, and ER visits in previous 12 months J.         Vitals K.         Screenings such as hearing, vision, cognitive function, and depression L.          Referrals and appointments:   In addition, I have reviewed and discussed with patient certain preventive protocols,  quality metrics, and best practice recommendations. A written personalized care plan for preventive services as well as general preventive health recommendations were provided to patient.   See attached scanned questionnaire for additional information.   Return in about 1 year (around 08/10/2022) for AWV.   Jacquelin Hawking, MHS, PA-C [facility]

## 2021-08-14 NOTE — Progress Notes (Signed)
Name: Kristie Walters   MRN: [id number]    DOB: [birth year]   Date:08/15/2021       Progress Note  Subjective  Chief Complaint  Follow Up  HPI  Abdominal distention/ Bloating: Patient had colonoscopy in 08/22/2020 and states they found 5 polyps and was advised to follow high fiber diet to help with bloating. She had CT abdomen and seen by GI. Symptoms are stable.   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 She states symptoms getting worse over the past couple of years, we will refer her back to Urologist   COPD/emphysema: she recently went to pulmonologist and is continuing with Trelegy.She states hoarseness resolved, occasionally has nocturnal wheezing but feeling better.   Vitamin D and B12 deficiency: taking supplementations and last levels at goal   Senile purpura: legs and intermittent on arms, but stable and reassurance given   Paroxysmal Afib.:Still taking medications as prescribed. 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 She states feels sleepy during the day, had labs done recently by cardiologist and will have an Echo done, we will try to skip Lyrica in am to see if fatigue improves. Explained it may also be due to bp being towards low end normal. We may need to consider discussing with cardiologist about cutting metoprolol dose in half She uses a cane   Aorta Atherosclerosis: she is on statin therapy, reviewed CT done in 2018. 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   Fatty liver 09/29/2017   Coronary artery disease 09/29/2017   Centrilobular emphysema ([place]) 09/29/2017   Basal cell carcinoma of nose 08/12/2017   Estrogen deficiency 08/01/2017   Aortic atherosclerosis ([place]) 09/14/2015   Personal history of tobacco use, presenting hazards to health 08/31/2015   Paresthesia of foot, bilateral 08/15/2015   Breast mass, right 07/12/2015   Elevated alkaline phosphatase level 05/29/2015   COPD (chronic obstructive pulmonary disease) ([place]) 04/26/2015   GERD (gastroesophageal reflux disease) 04/26/2015   Hyperlipidemia 04/26/2015   Hyperglycemia 04/26/2015   Carotid stenosis 08/25/2014   H/O malignant neoplasm of skin 08/13/2012    Past Surgical History:  Procedure Laterality Date   BREAST BIOPSY     COLONOSCOPY WITH PROPOFOL N/A 08/22/2020   Procedure: COLONOSCOPY WITH PROPOFOL;  Surgeon: Lucilla Lame, MD;  Location: [facility] ENDOSCOPY;  Service: Endoscopy;  Laterality: N/A;   ENDARTERECTOMY Left 08/25/2014   Procedure: ENDARTERECTOMY CAROTID;  Surgeon: Algernon Huxley, MD;  Location: [facility] ORS;  Service: Vascular;  Laterality: Left;   MOHS SURGERY     MOHS SURGERY  09/23/2017   nose   PERIPHERAL VASCULAR CATHETERIZATION N/A 07/20/2014   Procedure: Carotid Angiography;  Surgeon: Algernon Huxley, MD;  Location: [facility];  Service: Cardiovascular;  Laterality: N/A;   TONSILLECTOMY     TUBAL LIGATION  Family History  Problem Relation Age of Onset   Heart attack Mother    Hypercholesterolemia Mother    Hypertension Mother    Peripheral vascular disease Mother    Dementia Mother    Hypothyroidism Mother    CVA Father    Liver cancer Father    Diabetes Brother    Kidney cancer Sister    Diabetes Brother    Alzheimer's disease Brother    Other Brother        alzheimers   Lymphoma  Son    HIV Son    Breast cancer Neg Hx     Social History   Tobacco Use   Smoking status: Former    Packs/day: 1.00    Years: 55.00    Total pack years: 55.00    Types: Cigarettes    Quit date: 11/26/2016    Years since quitting: 4.7   Smokeless tobacco: Former    Types: Snuff   Tobacco comments:    smoking cessation materials not required  Substance Use Topics   Alcohol use: No     Current Outpatient Medications:    albuterol (VENTOLIN HFA) 108 (90 Base) MCG/ACT inhaler, Inhale 2 puffs into the lungs every 6 (six) hours as needed for wheezing or shortness of breath., Disp: 8 g, Rfl: 2   aspirin 81 MG tablet, Take 81 mg by mouth daily., Disp: , Rfl:    atorvastatin (LIPITOR) 40 MG tablet, TAKE 1 TABLET BY MOUTH EVERY DAY, Disp: 90 tablet, Rfl: 3   calcium carbonate (OS-CAL - DOSED IN MG OF ELEMENTAL CALCIUM) 1250 (500 Ca) MG tablet, Take 1 tablet by mouth., Disp: , Rfl:    cholecalciferol (VITAMIN D) 1000 units tablet, Take 1,000 Units by mouth daily., Disp: , Rfl:    Cyanocobalamin (VITAMIN B-12) 5000 MCG SUBL, Place under the tongue., Disp: , Rfl:    ELIQUIS 5 MG TABS tablet, TAKE 1 TABLET BY MOUTH TWICE A DAY, Disp: 180 tablet, Rfl: 1   ezetimibe (ZETIA) 10 MG tablet, TAKE 1 TABLET BY MOUTH EVERY DAY, Disp: 90 tablet, Rfl: 3   Fish Oil-Cholecalciferol (FISH OIL + D3 PO), Take 1,000 mg by mouth 1 day or 1 dose., Disp: , Rfl:    fluticasone (FLONASE) 50 MCG/ACT nasal spray, Place 2 sprays into both nostrils daily., Disp: 16 g, Rfl: 6   metoprolol succinate (TOPROL XL) 25 MG 24 hr tablet, Take 1 tablet (25 mg total) by mouth daily., Disp: 90 tablet, Rfl: 3   omeprazole (PRILOSEC) 40 MG capsule, TAKE 1 CAPSULE (40 MG TOTAL) BY MOUTH AS NEEDED, Disp: 90 capsule, Rfl: 0   pregabalin (LYRICA) 50 MG capsule, Take 1 capsule (50 mg total) by mouth 3 (three) times daily., Disp: 270 capsule, Rfl: 1   TRELEGY ELLIPTA 100-62.5-25 MCG/ACT AEPB, TAKE 1 PUFF BY MOUTH EVERY DAY, Disp: 60 each,  Rfl: 11  Allergies  Allergen Reactions   Morphine Nausea Only and Nausea And Vomiting   Morphine And Related Nausea And Vomiting   Zoster Vaccine Live Itching and Rash    I personally reviewed active problem list, medication list, allergies, family history, social history, health maintenance with the patient/caregiver today.   ROS  Constitutional: Negative for fever or weight change.  Respiratory: Negative for cough and stable shortness of breath.   Cardiovascular: Negative for chest pain or palpitations.  Gastrointestinal: Negative for abdominal pain, no bowel changes.  Musculoskeletal: Positive for gait problem but no joint swelling.  Skin: Negative for rash.  Neurological: Negative for dizziness or headache.  No other specific complaints in a complete review of systems (except as listed in HPI above).   Objective  Vitals:   08/15/21 1008  BP: 126/70  Pulse: 82  Resp: 16  SpO2: 98%  Weight: 173 lb (78.5 kg)  Height: '5\' 9"'$  (1.753 m)    Body mass index is 25.55 kg/m.  Physical Exam  Constitutional: Patient appears well-developed and well-nourished.  No distress.  HEENT: head atraumatic, normocephalic, pupils equal and reactive to light, neck supple, Cardiovascular: Normal rate, regular rhythm and normal heart sounds.  No murmur heard.Trace  BLE edema. Pulmonary/Chest: Effort normal and breath sounds normal. No respiratory distress. Abdominal: Soft.  There is no tenderness. Psychiatric: Patient has a normal mood and affect. behavior is normal. Judgment and thought content normal.   Recent Results (from the past 2160 hour(s))  CBC     Status: None   Collection Time: 08/03/21  2:21 PM  Result Value Ref Range   WBC 8.8 4.0 - 10.5 K/uL   RBC 4.70 3.87 - 5.11 MIL/uL   Hemoglobin 13.4 12.0 - 15.0 g/dL   HCT 40.6 36.0 - 46.0 %   MCV 86.4 80.0 - 100.0 fL   MCH 28.5 26.0 - 34.0 pg   MCHC 33.0 30.0 - 36.0 g/dL   RDW 14.3 11.5 - 15.5 %   Platelets 254 150 - 400 K/uL    nRBC 0.0 0.0 - 0.2 %    Comment: Performed at [facility], [address], [place], [place] [address]  Basic metabolic panel     Status: None   Collection Time: 08/03/21  2:21 PM  Result Value Ref Range   Sodium 140 135 - 145 mmol/L   Potassium 4.0 3.5 - 5.1 mmol/L   Chloride 109 98 - 111 mmol/L   CO2 24 22 - 32 mmol/L   Glucose, Bld 95 70 - 99 mg/dL    Comment: Glucose reference range applies only to samples taken after fasting for at least 8 hours.   BUN 17 8 - 23 mg/dL   Creatinine, Ser 0.92 0.44 - 1.00 mg/dL   Calcium 9.3 8.9 - 10.3 mg/dL   GFR, Estimated >60 >60 mL/min    Comment: (NOTE) Calculated using the CKD-EPI Creatinine Equation (2021)    Anion gap 7 5 - 15    Comment: Performed at [facility], [address] [place], [place] [address]  TSH     Status: None   Collection Time: 08/03/21  2:21 PM  Result Value Ref Range   TSH 3.494 0.350 - 4.500 uIU/mL    Comment: Performed by a 3rd Generation assay with a functional sensitivity of <=0.01 uIU/mL. Performed at [facility], [address] [place], [place] [address]     PHQ2/9:    08/15/2021   10:08 AM 08/09/2021    9:23 AM 04/18/2021    1:29 PM 01/15/2021    1:57 PM 09/29/2020    1:43 PM  Depression screen PHQ 2/9  Decreased Interest 0 0 0 0 0  Down, Depressed, Hopeless 0 0 0 0 0  PHQ - 2 Score 0 0 0 0 0  Altered sleeping   0 1   Tired, decreased energy   0 1   Change in appetite   0 0   Feeling bad or failure about yourself    0 0   Trouble concentrating   0 0   Moving slowly or fidgety/restless  0 0   Suicidal thoughts   0 0   PHQ-9 Score   0 2   Difficult doing work/chores   Not difficult at all      phq 9 is negative   Fall Risk:    08/15/2021   10:07 AM 08/09/2021    9:23 AM 04/18/2021    1:29 PM 01/15/2021    1:56 PM 09/29/2020    1:44 PM  [place] in the past year? 0 0 0 0 0  Number falls in past yr: 0 0 0 0 0  Injury with Fall? 0 0 0 0 0  Risk for fall  due to : Impaired balance/gait  No Fall Risks No Fall Risks   Follow up Falls prevention discussed Falls evaluation completed Education provided Falls prevention discussed Falls evaluation completed      Functional Status Survey: Is the patient deaf or have difficulty hearing?: Yes Does the patient have difficulty seeing, even when wearing glasses/contacts?: Yes Does the patient have difficulty concentrating, remembering, or making decisions?: No Does the patient have difficulty walking or climbing stairs?: Yes Does the patient have difficulty dressing or bathing?: No Does the patient have difficulty doing errands alone such as visiting a doctor's office or shopping?: No    Assessment & Plan  1. Bladder problem  - Ambulatory referral to Urology  2. Recurrent UTI  - Ambulatory referral to Urology  3. Gastroesophageal reflux disease without esophagitis  Doing well on omeprazole   4. Neuropathy  - pregabalin (LYRICA) 50 MG capsule; Take 1 capsule (50 mg total) by mouth 3 (three) times daily.  Dispense: 270 capsule; Refill: 1  5. Senile purpura (H[facility])  Stable   6. B12 deficiency  Continue supplementation   7. Vitamin D deficiency  Continue otc supplements  8. Coronary artery disease involving native coronary artery of native heart with angina pectoris ([place])  Under the care of cardiologist   9. Aortic atherosclerosis ([place])  On statin therapy   10. Paroxysmal atrial fibrillation (H[facility])  Rate controlled   11. COPD with chronic bronchitis and emphysema ([place])   On Trelegy and under the care of pulmonologist

## 2021-08-15 ENCOUNTER — Ambulatory Visit (INDEPENDENT_AMBULATORY_CARE_PROVIDER_SITE_OTHER): Payer: Medicare Other | Admitting: Family Medicine

## 2021-08-15 ENCOUNTER — Encounter: Payer: Self-pay | Admitting: Family Medicine

## 2021-08-15 VITALS — BP 126/70 | HR 82 | Resp 16 | Ht 69.0 in | Wt 173.0 lb

## 2021-08-15 DIAGNOSIS — K219 Gastro-esophageal reflux disease without esophagitis: Secondary | ICD-10-CM

## 2021-08-15 DIAGNOSIS — I25119 Atherosclerotic heart disease of native coronary artery with unspecified angina pectoris: Secondary | ICD-10-CM

## 2021-08-15 DIAGNOSIS — I700 Atherosclerosis of aorta: Secondary | ICD-10-CM

## 2021-08-15 DIAGNOSIS — N329 Bladder disorder, unspecified: Secondary | ICD-10-CM | POA: Insufficient documentation

## 2021-08-15 DIAGNOSIS — G629 Polyneuropathy, unspecified: Secondary | ICD-10-CM | POA: Insufficient documentation

## 2021-08-15 DIAGNOSIS — E559 Vitamin D deficiency, unspecified: Secondary | ICD-10-CM

## 2021-08-15 DIAGNOSIS — N390 Urinary tract infection, site not specified: Secondary | ICD-10-CM

## 2021-08-15 DIAGNOSIS — I480 Paroxysmal atrial fibrillation: Secondary | ICD-10-CM

## 2021-08-15 DIAGNOSIS — E538 Deficiency of other specified B group vitamins: Secondary | ICD-10-CM

## 2021-08-15 DIAGNOSIS — J449 Chronic obstructive pulmonary disease, unspecified: Secondary | ICD-10-CM

## 2021-08-15 DIAGNOSIS — D692 Other nonthrombocytopenic purpura: Secondary | ICD-10-CM | POA: Insufficient documentation

## 2021-08-15 MED ORDER — PREGABALIN 50 MG PO CAPS: 50.0000 mg | ORAL_CAPSULE | Freq: Three times a day (TID) | ORAL | 1 refills | Status: DC

## 2021-08-30 ENCOUNTER — Other Ambulatory Visit: Payer: Self-pay | Admitting: Family Medicine

## 2021-08-30 DIAGNOSIS — K219 Gastro-esophageal reflux disease without esophagitis: Secondary | ICD-10-CM

## 2021-09-04 ENCOUNTER — Ambulatory Visit (INDEPENDENT_AMBULATORY_CARE_PROVIDER_SITE_OTHER): Payer: Medicare Other

## 2021-09-04 DIAGNOSIS — R0602 Shortness of breath: Secondary | ICD-10-CM | POA: Diagnosis not present

## 2021-09-04 LAB — ECHOCARDIOGRAM COMPLETE
AR max vel: 2.7 cm2
AV Area VTI: 2.72 cm2
AV Area mean vel: 2.64 cm2
AV Mean grad: 4 mmHg
AV Peak grad: 6 mmHg
Ao pk vel: 1.22 m/s
Area-P 1/2: 2.97 cm2
Calc EF: 68.7 %
S' Lateral: 2.9 cm
Single Plane A2C EF: 65.4 %
Single Plane A4C EF: 72 %

## 2021-09-25 ENCOUNTER — Ambulatory Visit
Admission: RE | Admit: 2021-09-25 | Discharge: 2021-09-25 | Disposition: A | Discharge status: Home / Self Care | Payer: Medicare Other | Source: Ambulatory Visit | Attending: Physician Assistant | Admitting: Physician Assistant

## 2021-09-25 DIAGNOSIS — Z1231 Encounter for screening mammogram for malignant neoplasm of breast: Secondary | ICD-10-CM

## 2021-09-25 NOTE — Progress Notes (Signed)
Cardiology Office Note    Date:  09/28/2021   ID:  Kristie Walters, DOB 1946/01/30, MRN [id number]  PCP:  Steele Sizer, MD  Cardiologist:  Ida Rogue, MD  Electrophysiologist:  None   Chief Complaint: Follow up  History of Present Illness:   Kristie Walters is a 76 y.o. 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   She was previously followed by Dr. Clayborn Bigness though subsequently transitioned to Dr. Rockey Situ in 09/2017.  Prior echo from 09/2015, done by outside cardiology group, showed an EF greater than 55%, mild LVH, normal RV systolic function, mild MR/TR.  Nuclear stress test at that time showed an EF of 77% with no evidence of significant ischemia or scar.  She was referred to [facility] in 09/2017 for evaluation of incidentally noted aortic atherosclerosis and coronary artery calcium along the LAD on noninvasive imaging.  She was noted to not be very active at baseline secondary to back pain.  She did note some rare palpitations associated with stress that improved with rest.  Aggressive primary prevention was recommended.  She declined stress testing at that time.   She was seen in the ED in 02/2019 with palpitations and noted to be in new onset A. fib with RVR with LBBB (EMS EKG).  She had spontaneously converted to sinus rhythm in the field following IV metoprolol.  She was noted to have a new left bundle which persisted in sinus rhythm.  High-sensitivity troponin negative  x2.  TSH mildly elevated at 5.041 with a potassium of 3.5.  She was placed on metoprolol and Eliquis.  Subsequent echo in 03/2019 showed an EF of 55 to 60%, no regional wall motion abnormalities, grade 2 diastolic dysfunction, normal RV systolic function and ventricular cavity size, normal size left atrium, and mildly elevated PASP.     She was seen on 01/05/2020 noting a 2-week history of increased palpitations as well as some associated dizziness, shortness of breath, and chest discomfort.  Lexiscan MPI on 01/11/2020 showed no evidence of significant ischemia or scar with a hyperdynamic LVEF greater than 65%.  With administration of regadenoson she developed a LBBB with known intermittent LBBB.  Attenuation corrected CT images noted aortic atherosclerosis with no significant coronary artery calcification.  Overall, this was a low risk study.  Zio patch showed a predominant rhythm of sinus with an average heart rate of 65 bpm.  First-degree AV block was noted.  18 episodes of SVT were noted with the longest interval lasting 10 beats with an average rate of 109 bpm and the fastest interval lasting 4 beats with a maximum rate of 158 bpm.  Isolated PACs, atrial couplets, atrial triplets, and PVCs were noted.  There were 6 patient triggered events and these were not associated with significant arrhythmia.  Echo in 02/2020 showed an EF of 55-60%, no RWMA, Gr1DD, normal RV systolic function and ventricular cavity size, and a PASP of 35.7 mmHg.   She was seen in the office in 5//2023 and was without symptoms of angina or  decompensation.  She did report onset of left-sided palpitations and pinpoint chest discomfort that began after taking metoprolol titrate from a different manufacturer with symptoms typically lasting 1 to 2 minutes and spontaneously resolving.  With this, she self discontinued Lopressor and noted improvement, though not resolution of symptoms.  She was also under increased stress with the health of her  son.  We underwent a trial of Toprol-XL 25 mg daily.  She was last seen in the office in 07/2021 and was without symptoms of angina or decompensation.  She noted chronic stable exertional dyspnea.  Her main complaint at that time was fatigue that has been present for approximately 1 week.  Upon initiating metoprolol succinate, she noted resolution of palpitations.  Her son and daughter were doing well following stem cell donor/transplant.  She did not qualify for sleep study based on her STOP-BANG score.  She comes in doing well from a cardiac perspective and is without symptoms of angina or decompensation.  Her chronic exertional dyspnea is somewhat improved.  She does not have any further fatigue.  Her stress level is less as her son is out of the hospital following his stem cell transplant.  No significant lower extremity swelling or orthopnea.  Her weight remains stable.  No further palpitations following initiation of metoprolol succinate.  No dizziness, presyncope, or syncope.  Overall, she does not have any active cardiac issues or concerns and feels like she is doing quite well.   Labs independently reviewed: 07/2021 - TSH normal, potassium 4.0, BUN 17, serum creatinine 0.92, Hgb 13.4, PLT 254 04/2021 - TC 142, TG 194, HDL 34, LDL 79, albumin 4.4, AST normal, ALT 31, TSH normal  Past Medical History:  Diagnosis Date   Allergy    Atrial fibrillation ([place])    Basal cell carcinoma of nose 08/12/2017   Carotid artery disease (HCC)    s/p L. CEA in July 2016   Centrilobular emphysema ([place]) 09/29/2017   Collagen vascular disease ([place])    Left carotid stenosis   COPD (chronic obstructive pulmonary disease) (HCC)    Coronary artery disease 09/29/2017   Noted on chest CT July 2019   Dysrhythmia    Elevated blood pressure (not hypertension)    Fatty liver 09/29/2017   Chest CT July 2019   GERD (gastroesophageal reflux disease)    Hyperlipidemia    Personal history of tobacco use, presenting  hazards to health 08/31/2015   PONV (postoperative nausea and vomiting)     Past Surgical History:  Procedure Laterality Date   BREAST BIOPSY     COLONOSCOPY WITH PROPOFOL N/A 08/22/2020   Procedure: COLONOSCOPY WITH PROPOFOL;  Surgeon: Lucilla Lame, MD;  Location: [facility] ENDOSCOPY;  Service: Endoscopy;  Laterality: N/A;   ENDARTERECTOMY Left 08/25/2014   Procedure: ENDARTERECTOMY CAROTID;  Surgeon: Algernon Huxley, MD;  Location: [facility] ORS;  Service: Vascular;  Laterality: Left;   MOHS SURGERY     MOHS SURGERY  09/23/2017   nose   PERIPHERAL VASCULAR CATHETERIZATION N/A 07/20/2014   Procedure: Carotid Angiography;  Surgeon: Algernon Huxley, MD;  Location: [place] [facility];  Service: Cardiovascular;  Laterality: N/A;   TONSILLECTOMY     TUBAL LIGATION      Current Medications: Current Meds  Medication Sig   albuterol (VENTOLIN HFA) 108 (90 Base) MCG/ACT inhaler Inhale 2 puffs into the lungs every 6 (six) hours as needed for wheezing or shortness of breath.   aspirin 81 MG tablet Take 81 mg by mouth  daily.   atorvastatin (LIPITOR) 40 MG tablet TAKE 1 TABLET BY MOUTH EVERY DAY   calcium carbonate (OS-CAL - DOSED IN MG OF ELEMENTAL CALCIUM) 1250 (500 Ca) MG tablet Take 1 tablet by mouth.   cholecalciferol (VITAMIN D) 1000 units tablet Take 1,000 Units by mouth daily.   Cyanocobalamin (VITAMIN B-12) 5000 MCG SUBL Place under the tongue.   ELIQUIS 5 MG TABS tablet TAKE 1 TABLET BY MOUTH TWICE A DAY   ezetimibe (ZETIA) 10 MG tablet TAKE 1 TABLET BY MOUTH EVERY DAY   Fish Oil-Cholecalciferol (FISH OIL + D3 PO) Take 1,000 mg by mouth 1 day or 1 dose.   fluticasone (FLONASE) 50 MCG/ACT nasal spray Place 2 sprays into both nostrils daily.   metoprolol succinate (TOPROL XL) 25 MG 24 hr tablet Take 1 tablet (25 mg total) by mouth daily.   omeprazole (PRILOSEC) 40 MG capsule TAKE 1 CAPSULE (40 MG TOTAL) BY MOUTH AS NEEDED   pregabalin (LYRICA) 50 MG capsule Take 1 capsule (50 mg total) by mouth 3 (three)  times daily.   TRELEGY ELLIPTA 100-62.5-25 MCG/ACT AEPB TAKE 1 PUFF BY MOUTH EVERY DAY    Allergies:   Morphine, Morphine and related, and Zoster vaccine live   Social History   Socioeconomic History   Marital status: Divorced    Spouse name: Not on file   Number of children: 2   Years of education: Not on file   Highest education level: 10th grade  Occupational History   Occupation: Retired  Tobacco Use   Smoking status: Former    Packs/day: 1.00    Years: 55.00    Total pack years: 55.00    Types: Cigarettes    Quit date: 11/26/2016    Years since quitting: 4.8   Smokeless tobacco: Former    Types: Snuff   Tobacco comments:    smoking cessation materials not required  Vaping Use   Vaping Use: Never used  Substance and Sexual Activity   Alcohol use: No   Drug use: No   Sexual activity: Not Currently  Other Topics Concern   Not on file  Social History Narrative    Pt lives alone   Social Determinants of Health   Financial Resource Strain: Medium Risk (08/09/2021)   Overall Financial Resource Strain (CARDIA)    Difficulty of Paying Living Expenses: Somewhat hard  Food Insecurity: No Food Insecurity (08/09/2021)   Hunger Vital Sign    Worried About Running Out of Food in the Last Year: Never true    Ran Out of Food in the Last Year: Never true  Transportation Needs: No Transportation Needs (08/09/2021)   PRAPARE - Hydrologist (Medical): No    Lack of Transportation (Non-Medical): No  Physical Activity: Insufficiently Active (08/09/2021)   Exercise Vital Sign    Days of Exercise per Week: 1 day    Minutes of Exercise per Session: 10 min  Stress: No Stress Concern Present (08/09/2021)   [place]    Feeling of Stress : Only a little  Social Connections: Moderately Isolated (08/08/2020)   Social Connection and Isolation Panel [NHANES]    Frequency of Communication with  Friends and Family: More than three times a week    Frequency of Social Gatherings with Friends and Family: Three times a week    Attends Religious Services: More than 4 times per year    Active Member of Clubs or Organizations: No  Attends Archivist Meetings: Never    Marital Status: Divorced     Family History:  The patient's family history includes Alzheimer's disease in her brother; CVA in her father; Dementia in her mother; Diabetes in her brother and brother; HIV in her son; Heart attack in her mother; Hypercholesterolemia in her mother; Hypertension in her mother; Hypothyroidism in her mother; Kidney cancer in her sister; Liver cancer in her father; Lymphoma in her son; Other in her brother; Peripheral vascular disease in her mother. There is no history of Breast cancer.  ROS:   12-point review of systems is negative unless otherwise noted in the HPI.   EKGs/Labs/Other Studies Reviewed:    Studies reviewed were summarized above. The additional studies were reviewed today:  2D echo 09/04/2021: 1. Left ventricular ejection fraction, by estimation, is 55 to 60%. The  left ventricle has normal function. The left ventricle has no regional  wall motion abnormalities. Left ventricular diastolic parameters are  consistent with Grade I diastolic  dysfunction (impaired relaxation).   2. Right ventricular systolic function is normal. The right ventricular  size is normal. Tricuspid regurgitation signal is inadequate for assessing  PA pressure.   3. The mitral valve is normal in structure. No evidence of mitral valve  regurgitation. No evidence of mitral stenosis.   4. The aortic valve was not well visualized. Aortic valve regurgitation  is not visualized. No aortic stenosis is present.   5. The inferior vena cava is normal in size with greater than 50%  respiratory variability, suggesting right atrial pressure of 3 mmHg.   Comparison(s): 02/2020-EF  55-60%. __________  Carotid artery ultrasound 07/20/2021: Summary:  Right Carotid: Velocities in the right ICA are consistent with a 1-39% stenosis.   Left Carotid: Velocities in the left ICA are consistent with a 1-39%  stenosis.   Vertebrals:  Bilateral vertebral arteries demonstrate antegrade flow.  Subclavians: Normal flow hemodynamics were seen in bilateral subclavian arteries. ___________  2D echo 03/09/2020: 1. Left ventricular ejection fraction, by estimation, is 55 to 60%. The  left ventricle has normal function. The left ventricle has no regional  wall motion abnormalities. Left ventricular diastolic parameters are  consistent with Grade I diastolic  dysfunction (impaired relaxation).   2. Right ventricular systolic function is normal. The right ventricular  size is normal. There is normal pulmonary artery systolic pressure. The  estimated right ventricular systolic pressure is 16.0 mmHg.   3. The aortic valve was not well visualized. __________   Carlton Adam MPI 01/11/2020: Normal pharmacologic myocardial perfusion stress test without evidence of significant ischemia or scar. The left ventricular ejection fraction is hyperdynamic (>65%). Left bundle branch block developed after administration of regadenoson. Patient known to have intermittent LBBB. Attenuation correction CT is notable for aortic atherosclerosis. There is no significant coronary artery calcification. This is a low risk study. __________   Elwyn Reach patch 12/2019: Normal sinus rhythm avg HR of 65 bpm.    Patient triggered events (6) were not associated with significant arrhythmia.   First Degree AV Block was present.  18 Supraventricular Tachycardia runs occurred, the run with the fastest interval lasting 4 beats with a max rate of 158 bpm, the longest lasting 10 beats with an avg rate of 109 bpm.   Isolated SVEs were rare (<1.0%), SVE Couplets were rare (<1.0%), and SVE Triplets were rare (<1.0%).  Isolated VEs were rare (<1.0%), and no VE Couplets or VE Triplets were present. __________   2D echo 03/2019: 1. Left  ventricular ejection fraction, by visual estimation, is 55 to  60%. The left ventricle has normal function. There is no left ventricular  hypertrophy.   2. Left ventricular diastolic parameters are consistent with Grade II  diastolic dysfunction (pseudonormalization).   3. The left ventricle has no regional wall motion abnormalities.   4. Global right ventricle has normal systolic function.The right  ventricular size is normal. No increase in right ventricular wall  thickness.   5. Left atrial size was normal.   6. Mildly elevated pulmonary artery systolic pressure.   EKG:  EKG is ordered today.  The EKG ordered today demonstrates NSR, 67 bpm, LBBB (known)  Recent Labs: 04/18/2021: ALT 31 08/03/2021: BUN 17; Creatinine, Ser 0.92; Hemoglobin 13.4; Platelets 254; Potassium 4.0; Sodium 140; TSH 3.494  Recent Lipid Panel    Component Value Date/Time   CHOL 142 04/18/2021 1423   CHOL 155 05/08/2015 0927   TRIG 194 (H) 04/18/2021 1423   HDL 34 (L) 04/18/2021 1423   HDL 30 (L) 05/08/2015 0927   CHOLHDL 4.2 04/18/2021 1423   LDLCALC 79 04/18/2021 1423    PHYSICAL EXAM:    VS:  BP 120/70 (BP Location: Left Arm, Patient Position: Sitting, Cuff Size: Normal)   Pulse 67   Ht '5\' 10"'$  (1.778 m)   Wt 169 lb 6.4 oz (76.8 kg)   SpO2 95%   BMI 24.31 kg/m   BMI: Body mass index is 24.31 kg/m.  Physical Exam Vitals reviewed.  Constitutional:      Appearance: She is well-developed.  HENT:     Head: Normocephalic and atraumatic.  Eyes:     General:        Right eye: No discharge.        Left eye: No discharge.  Neck:     Vascular: No JVD.  Cardiovascular:     Rate and Rhythm: Normal rate and regular rhythm.     Pulses:          Posterior tibial pulses are 2+ on the right side and 2+ on the left side.     Heart sounds: Normal heart sounds, S1 normal and S2 normal.  Heart sounds not distant. No midsystolic click and no opening snap. No murmur heard.    No friction rub.  Pulmonary:     Effort: Pulmonary effort is normal. No respiratory distress.     Breath sounds: Normal breath sounds. No decreased breath sounds, wheezing or rales.  Chest:     Chest wall: No tenderness.  Abdominal:     General: There is no distension.  Musculoskeletal:     Cervical back: Normal range of motion.     Right lower leg: No edema.     Left lower leg: No edema.  Skin:    General: Skin is warm and dry.     Nails: There is no clubbing.  Neurological:     Mental Status: She is alert and oriented to person, place, and time.  Psychiatric:        Speech: Speech normal.        Behavior: Behavior normal.        Thought Content: Thought content normal.        Judgment: Judgment normal.     Wt Readings from Last 3 Encounters:  09/28/21 169 lb 6.4 oz (76.8 kg)  08/15/21 173 lb (78.5 kg)  08/09/21 172 lb 9.6 oz (78.3 kg)     ASSESSMENT & PLAN:   CAD involving the native coronaries  without angina: She continues to do very well without symptoms concerning for angina or decompensation.  Continue aggressive resector modification and primary prevention including aspirin, atorvastatin, ezetimibe, and metoprolol succinate.  No indication for further ischemic testing at this time.  PAF/PSVT: Quiescent on metoprolol succinate.  CHA2DS2-VASc of at least 3.  She remains on apixaban 5 mg twice daily without symptoms concerning for bleeding.  She does not meet reduced dosing criteria.  Recent CBC and BMP stable.  Diastolic dysfunction: She is euvolemic and well compensated.  Blood pressure is well controlled.  Not requiring a standing diuretic.  Continue optimal blood pressure control.  LBBB: Chronic and stable.  Echo demonstrated preserved LV systolic function with Lexiscan MPI in 2021 showing no evidence of ischemia.  No syncope.  Continue to monitor.  HLD: LDL 79.  He remains on  atorvastatin and ezetimibe.  Carotid artery disease: Status post left-sided CEA in 08/2014.  Carotid artery ultrasound from 07/2021 demonstrated bilateral 1 to 39% stenoses.  She remains on aspirin and atorvastatin.  Follow-up with vascular surgery as directed.  COPD: Stable.  Follow-up with pulmonology as directed.    Disposition: F/u with Dr. Rockey Situ or an APP in 6 months.   Medication Adjustments/Labs and Tests Ordered: Current medicines are reviewed at length with the patient today.  Concerns regarding medicines are outlined above. Medication changes, Labs and Tests ordered today are summarized above and listed in the Patient Instructions accessible in Encounters.   Signed, Christell Faith, PA-C 09/28/2021 4:58 PM     [address] [place], [place] [address] [phone]

## 2021-09-26 ENCOUNTER — Other Ambulatory Visit: Payer: Self-pay

## 2021-09-26 DIAGNOSIS — Z1231 Encounter for screening mammogram for malignant neoplasm of breast: Secondary | ICD-10-CM

## 2021-09-26 NOTE — Progress Notes (Signed)
Order placed for 1 year

## 2021-09-28 ENCOUNTER — Encounter: Payer: Self-pay | Admitting: Physician Assistant

## 2021-09-28 ENCOUNTER — Ambulatory Visit (INDEPENDENT_AMBULATORY_CARE_PROVIDER_SITE_OTHER): Payer: Medicare Other | Admitting: Physician Assistant

## 2021-09-28 VITALS — BP 120/70 | HR 67 | Ht 70.0 in | Wt 169.4 lb

## 2021-09-28 DIAGNOSIS — R5383 Other fatigue: Secondary | ICD-10-CM

## 2021-09-28 DIAGNOSIS — I5189 Other ill-defined heart diseases: Secondary | ICD-10-CM

## 2021-09-28 DIAGNOSIS — I2510 Atherosclerotic heart disease of native coronary artery without angina pectoris: Secondary | ICD-10-CM | POA: Diagnosis not present

## 2021-09-28 DIAGNOSIS — I471 Supraventricular tachycardia: Secondary | ICD-10-CM | POA: Diagnosis not present

## 2021-09-28 DIAGNOSIS — I6523 Occlusion and stenosis of bilateral carotid arteries: Secondary | ICD-10-CM

## 2021-09-28 DIAGNOSIS — I480 Paroxysmal atrial fibrillation: Secondary | ICD-10-CM | POA: Diagnosis not present

## 2021-09-28 DIAGNOSIS — E785 Hyperlipidemia, unspecified: Secondary | ICD-10-CM

## 2021-09-28 DIAGNOSIS — I447 Left bundle-branch block, unspecified: Secondary | ICD-10-CM

## 2021-09-28 NOTE — Patient Instructions (Signed)
Medication Instructions:  Your physician recommends that you continue on your current medications as directed. Please refer to the Current Medication list given to you today.  *If you need a refill on your cardiac medications before your next appointment, please call your pharmacy*   Lab Work: None  If you have labs (blood work) drawn today and your tests are completely normal, you will receive your results only by: [place] (if you have MyChart) OR A paper copy in the mail If you have any lab test that is abnormal or we need to change your treatment, we will call you to review the results.   Testing/Procedures: None   Follow-Up: At [facility], you and your health needs are our priority.  As part of our continuing mission to provide you with exceptional heart care, we have created designated Provider Care Teams.  These Care Teams include your primary Cardiologist (physician) and Advanced Practice Providers (APPs -  Physician Assistants and Nurse Practitioners) who all work together to provide you with the care you need, when you need it.  We recommend signing up for the patient portal called "MyChart".  Sign up information is provided on this After Visit Summary.  MyChart is used to connect with patients for Virtual Visits (Telemedicine).  Patients are able to view lab/test results, encounter notes, upcoming appointments, etc.  Non-urgent messages can be sent to your provider as well.   To learn more about what you can do with MyChart, go to [email or web address].    Your next appointment:   6 month(s)  The format for your next appointment:   In Person  Provider:   Ida Rogue, MD or Christell Faith, PA-C      Important Information About Sugar

## 2021-10-24 ENCOUNTER — Emergency Department: Payer: Medicare Other

## 2021-10-24 ENCOUNTER — Other Ambulatory Visit: Payer: Self-pay

## 2021-10-24 ENCOUNTER — Emergency Department
Admission: EM | Admit: 2021-10-24 | Discharge: 2021-10-24 | Disposition: A | Discharge status: Home / Self Care | Payer: Medicare Other | Attending: Emergency Medicine | Admitting: Emergency Medicine

## 2021-10-24 DIAGNOSIS — J449 Chronic obstructive pulmonary disease, unspecified: Secondary | ICD-10-CM | POA: Insufficient documentation

## 2021-10-24 DIAGNOSIS — Z20822 Contact with and (suspected) exposure to covid-19: Secondary | ICD-10-CM | POA: Diagnosis not present

## 2021-10-24 DIAGNOSIS — I480 Paroxysmal atrial fibrillation: Secondary | ICD-10-CM | POA: Insufficient documentation

## 2021-10-24 DIAGNOSIS — R0602 Shortness of breath: Secondary | ICD-10-CM | POA: Diagnosis present

## 2021-10-24 LAB — CBC
HCT: 40.3 % (ref 36.0–46.0)
Hemoglobin: 13 g/dL (ref 12.0–15.0)
MCH: 28.3 pg (ref 26.0–34.0)
MCHC: 32.3 g/dL (ref 30.0–36.0)
MCV: 87.6 fL (ref 80.0–100.0)
Platelets: 165 10*3/uL (ref 150–400)
RBC: 4.6 MIL/uL (ref 3.87–5.11)
RDW: 14 % (ref 11.5–15.5)
WBC: 6.7 10*3/uL (ref 4.0–10.5)
nRBC: 0 % (ref 0.0–0.2)

## 2021-10-24 LAB — BASIC METABOLIC PANEL
Anion gap: 6 (ref 5–15)
BUN: 17 mg/dL (ref 8–23)
CO2: 23 mmol/L (ref 22–32)
Calcium: 8.4 mg/dL — ABNORMAL LOW (ref 8.9–10.3)
Chloride: 111 mmol/L (ref 98–111)
Creatinine, Ser: 0.97 mg/dL (ref 0.44–1.00)
GFR, Estimated: >60 mL/min (ref >60)
Glucose, Bld: 203 mg/dL — ABNORMAL HIGH (ref 70–99)
Potassium: 3.8 mmol/L (ref 3.5–5.1)
Sodium: 140 mmol/L (ref 135–145)

## 2021-10-24 LAB — SARS CORONAVIRUS 2 BY RT PCR: SARS Coronavirus 2 by RT PCR: NEGATIVE

## 2021-10-24 LAB — TROPONIN I (HIGH SENSITIVITY)
Troponin I (High Sensitivity): 13 ng/L (ref <18)
Troponin I (High Sensitivity): 20 ng/L — ABNORMAL HIGH (ref <18)

## 2021-10-24 LAB — LIPASE, BLOOD: Lipase: 33 U/L (ref 11–51)

## 2021-10-24 MED ORDER — SODIUM CHLORIDE 0.9 % IV BOLUS
1000.0000 mL | Freq: Once | INTRAVENOUS | Status: AC
  Administered 2021-10-24: 1000 mL via INTRAVENOUS

## 2021-10-24 NOTE — ED Notes (Signed)
Pt provided phone for MRI.

## 2021-10-24 NOTE — Discharge Instructions (Signed)
Your lab tests were all normal today.  Continue taking all of your medications.  Please follow-up with your cardiologist for reassessment of your atrial fibrillation and medication regimen.

## 2021-10-24 NOTE — ED Notes (Signed)
Daughter at bedside. Water provided to pt.  Monitor shows NSR. Did repeat EKG.

## 2021-10-24 NOTE — ED Provider Notes (Signed)
[facility] Provider Note    Event Date/Time   First MD Initiated Contact with Patient 10/24/21 1026     (approximate)   History   Chief Complaint: a fib RVR   HPI  Kristie Walters is a 76 y.o. female with a history of COPD GERD atrial fibrillation who comes the ED complaining of shortness of breath, generalized weakness, fatigue, chills for the past 2 days.  She has been compliant with all of her medications, last took them all this morning.  EMS noticed patient was in A-fib with heart rate ranging from 90-170.  They gave 10 mg of IV Cardizem along with 500 mL IV fluid bolus.  Patient normally takes metoprolol at home.  She denies chest pain or exertional symptoms but does have persistent shortness of breath.     Physical Exam   Triage Vital Signs: ED Triage Vitals  Enc Vitals Group     BP 10/24/21 1028 (!) 155/76     Pulse Rate 10/24/21 1028 93     Resp 10/24/21 1028 16     Temp 10/24/21 1028 97.6 F (36.4 C)     Temp Source 10/24/21 1028 Oral     SpO2 10/24/21 1028 97 %     Weight 10/24/21 1024 170 lb (77.1 kg)     Height 10/24/21 1024 '5\' 11"'$  (1.803 m)     Head Circumference --      Peak Flow --      Pain Score 10/24/21 1022 0     Pain Loc --      Pain Edu? --      Excl. in [place]? --     Most recent vital signs: Vitals:   10/24/21 1130 10/24/21 1200  BP: 118/60 (!) 112/59  Pulse: 64 66  Resp: 15 13  Temp:    SpO2: 97% 96%    General: Awake, no distress.  CV:  Good peripheral perfusion.  Irregularly irregular rhythm, heart rate of about 110 Resp:  Normal effort.  Clear to auscultation bilaterally Abd:  No distention.  Soft and nontender Other:  No lower extremity edema, no rash.  Dry mucous membranes.   ED Results / Procedures / Treatments   Labs (all labs ordered are listed, but only abnormal results are displayed) Labs Reviewed  BASIC METABOLIC PANEL - Abnormal; Notable for the following components:      Result Value   Glucose,  Bld 203 (*)    Calcium 8.4 (*)    All other components within normal limits  TROPONIN I (HIGH SENSITIVITY) - Abnormal; Notable for the following components:   Troponin I (High Sensitivity) 20 (*)    All other components within normal limits  SARS CORONAVIRUS 2 BY RT PCR  CBC  LIPASE, BLOOD  TROPONIN I (HIGH SENSITIVITY)     EKG Interpreted by me Atrial fibrillation, rate 106.  Normal axis, left bundle branch block.  No acute ischemic changes.   RADIOLOGY Chest x-ray interpreted by me, appears normal.  Radiology report reviewed   PROCEDURES:  Procedures   MEDICATIONS ORDERED IN ED: Medications  sodium chloride 0.9 % bolus 1,000 mL (0 mLs Intravenous Stopped 10/24/21 1317)     IMPRESSION / MDM / ASSESSMENT AND PLAN / ED COURSE  I reviewed the triage vital signs and the nursing notes.  Differential diagnosis includes, but is not limited to, pneumonia, pleural effusion, pulmonary edema, viral illness, dehydration, AKI, electrolyte abnormality, anemia, uncontrolled atrial fibrillation  Patient's presentation is most consistent with acute presentation with potential threat to life or bodily function.  Patient presents with fatigue and shortness of breath in the setting of atrial fibrillation with RVR.  On arrival to the ED, heart rate is reasonably rate controlled between 90 and 110.  Other vitals are normal.  Will check labs and chest x-ray, give IV fluids.   Clinical Course as of 10/24/21 1336  Wed Oct 24, 2021  1102 Pt spontaneously converted to sinus rhythm, rate of 74. No ischemic changes. [PS]  1335 Repeat troponin minimally changed, attributable to the tachycardia.  Doubt ACS PE dissection or carditis.  Stable for discharge. [PS]    Clinical Course User Index [PS] Carrie Mew, MD     FINAL CLINICAL IMPRESSION(S) / ED DIAGNOSES   Final diagnoses:  Paroxysmal atrial fibrillation ([place])     Rx / DC Orders   ED Discharge  Orders     None        Note:  This document was prepared using Dragon voice recognition software and may include unintentional dictation errors.   Carrie Mew, MD 10/24/21 1336

## 2021-10-24 NOTE — ED Triage Notes (Signed)
Pt to ED via [facility] from home Woke up, dizzy and jittery a fib RVR 90-170s, hx same Takes metoprolol, took this AM '10mg'$  cardizem given by EMS, HR 70s-140 now   136/72, 96% RA, got 500 IV fluid Peaked t waveson EMS ekg  18g R AC by EMS  Pt denies CP, is alert and oriented on RA. Pt takes Eliquis  EDP at bedside

## 2021-10-29 ENCOUNTER — Encounter: Payer: Self-pay | Admitting: Urology

## 2021-10-29 ENCOUNTER — Ambulatory Visit (INDEPENDENT_AMBULATORY_CARE_PROVIDER_SITE_OTHER): Payer: Medicare Other | Admitting: Urology

## 2021-10-29 VITALS — BP 131/78 | HR 77 | Ht 71.0 in | Wt 170.0 lb

## 2021-10-29 DIAGNOSIS — R300 Dysuria: Secondary | ICD-10-CM

## 2021-10-29 DIAGNOSIS — N3020 Other chronic cystitis without hematuria: Secondary | ICD-10-CM

## 2021-10-29 LAB — URINALYSIS, COMPLETE
Bilirubin, UA: NEGATIVE
Glucose, UA: NEGATIVE
Ketones, UA: NEGATIVE
Leukocytes,UA: NEGATIVE
Nitrite, UA: NEGATIVE
Protein,UA: NEGATIVE
Specific Gravity, UA: <1.005 — ABNORMAL LOW (ref 1.005–1.030)
Urobilinogen, Ur: 0.2 mg/dL (ref 0.2–1.0)
pH, UA: 5.5 (ref 5.0–7.5)

## 2021-10-29 LAB — MICROSCOPIC EXAMINATION: Bacteria, UA: NONE SEEN

## 2021-10-29 NOTE — Progress Notes (Signed)
10/29/2021 11:18 AM   Kristie Walters Dec 03, 1945 [id number]  Referring provider: Steele Sizer, MD [address] [place],  [place] [address]  Chief Complaint  Patient presents with   New Patient (Initial Visit)   Urinary Tract Infection    HPI: I was consulted to assist the patient for urinary tract infections.  She is not a good historian.  I think she gets about 2 infections a year with frequency and feels irritated or burning.  She will void small amounts.  She may or may not improve on antibiotics.  She has had positive cultures in medical record.  She had a 7 mm stone nonobstructing right kidney 1 year ago on CT scan.  She is continent.  She voids every 2 hours both day and night.  No hysterectomy  No neurologic issues.  No bladder surgery   PMH: Past Medical History:  Diagnosis Date   Allergy    Atrial fibrillation ([place])    Basal cell carcinoma of nose 08/12/2017   Carotid artery disease (HCC)    s/p L. 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/2017   Collagen vascular disease ([place])    Left carotid stenosis   COPD (chronic obstructive pulmonary disease) (H[facility])    Coronary artery disease 09/29/2017   Noted on chest CT July 2019   Dysrhythmia    Elevated blood pressure (not hypertension)    Fatty liver 09/29/2017   Chest CT July 2019   GERD (gastroesophageal reflux disease)    Hyperlipidemia    Personal history of tobacco use, presenting hazards to health 08/31/2015   PONV (postoperative nausea and vomiting)     Surgical History: Past Surgical History:  Procedure Laterality Date   BREAST BIOPSY     COLONOSCOPY WITH PROPOFOL N/A 08/22/2020   Procedure: COLONOSCOPY WITH PROPOFOL;  Surgeon: Lucilla Lame, MD;  Location: [facility] ENDOSCOPY;  Service: Endoscopy;  Laterality: N/A;   ENDARTERECTOMY Left 08/25/2014   Procedure: ENDARTERECTOMY CAROTID;  Surgeon: Algernon Huxley, MD;  Location: [facility] ORS;  Service: Vascular;  Laterality: Left;   MOHS  SURGERY     MOHS SURGERY  09/23/2017   nose   PERIPHERAL VASCULAR CATHETERIZATION N/A 07/20/2014   Procedure: Carotid Angiography;  Surgeon: Algernon Huxley, MD;  Location: [facility];  Service: Cardiovascular;  Laterality: N/A;   TONSILLECTOMY     TUBAL LIGATION      Home Medications:  Allergies as of 10/29/2021       Reactions   Morphine Nausea Only, Nausea And Vomiting   Morphine And Related Nausea And Vomiting   Zoster Vaccine Live Itching, Rash        Medication List        Accurate as of October 29, 2021 11:18 AM. If you have any questions, ask your nurse or doctor.          albuterol 108 (90 Base) MCG/ACT inhaler Commonly known as: VENTOLIN HFA Inhale 2 puffs into the lungs every 6 (six) hours as needed for wheezing or shortness of breath.   aspirin 81 MG tablet Take 81 mg by mouth daily.   atorvastatin 40 MG tablet Commonly known as: LIPITOR TAKE 1 TABLET BY MOUTH EVERY DAY   calcium carbonate 1250 (500 Ca) MG tablet Commonly known as: OS-CAL - dosed in mg of elemental calcium Take 1 tablet by mouth.   cholecalciferol 1000 units tablet Commonly known as: VITAMIN D Take 1,000 Units by mouth daily.   Eliquis  5 MG Tabs tablet Generic drug: apixaban TAKE 1 TABLET BY MOUTH TWICE A DAY   ezetimibe 10 MG tablet Commonly known as: ZETIA TAKE 1 TABLET BY MOUTH EVERY DAY   FISH OIL + D3 PO Take 1,000 mg by mouth 1 day or 1 dose.   fluticasone 50 MCG/ACT nasal spray Commonly known as: FLONASE Place 2 sprays into both nostrils daily.   metoprolol succinate 25 MG 24 hr tablet Commonly known as: Toprol XL Take 1 tablet (25 mg total) by mouth daily.   omeprazole 40 MG capsule Commonly known as: PRILOSEC TAKE 1 CAPSULE (40 MG TOTAL) BY MOUTH AS NEEDED   pregabalin 50 MG capsule Commonly known as: Lyrica Take 1 capsule (50 mg total) by mouth 3 (three) times daily.   Trelegy Ellipta 100-62.5-25 MCG/ACT Aepb Generic drug:  Fluticasone-Umeclidin-Vilant TAKE 1 PUFF BY MOUTH EVERY DAY   Vitamin B-12 5000 MCG Subl Place under the tongue.        Allergies:  Allergies  Allergen Reactions   Morphine Nausea Only and Nausea And Vomiting   Morphine And Related Nausea And Vomiting   Zoster Vaccine Live Itching and Rash    Family History: Family History  Problem Relation Age of Onset   Heart attack Mother    Hypercholesterolemia Mother    Hypertension Mother    Peripheral vascular disease Mother    Dementia Mother    Hypothyroidism Mother    CVA Father    Liver cancer Father    Diabetes Brother    Kidney cancer Sister    Diabetes Brother    Alzheimer's disease Brother    Other Brother        alzheimers   Lymphoma Son    HIV Son    Breast cancer Neg Hx     Social History:  reports that she quit smoking about 4 years ago. Her smoking use included cigarettes. She has a 55.00 pack-year smoking history. She has been exposed to tobacco smoke. She has quit using smokeless tobacco.  Her smokeless tobacco use included snuff. She reports that she does not drink alcohol and does not use drugs.  ROS:                                        Physical Exam: BP 131/78   Pulse 77   Ht '5\' 11"'$  (1.803 m)   Wt 77.1 kg   BMI 23.71 kg/m   Constitutional:  Alert and oriented, No acute distress. HEENT: [place] AT, moist mucus membranes.  Trachea midline, no masses.   Laboratory Data: Lab Results  Component Value Date   WBC 6.7 10/24/2021   HGB 13.0 10/24/2021   HCT 40.3 10/24/2021   MCV 87.6 10/24/2021   PLT 165 10/24/2021    Lab Results  Component Value Date   CREATININE 0.97 10/24/2021    No results found for: "PSA"  No results found for: "TESTOSTERONE"  Lab Results  Component Value Date   HGBA1C 5.6 08/01/2017    Urinalysis    Component Value Date/Time   COLORURINE YELLOW 02/02/2018 1122   APPEARANCEUR CLEAR 02/02/2018 1122   APPEARANCEUR Clear 02/20/2014 1027    LABSPEC 1.007 02/02/2018 1122   LABSPEC 1.004 02/20/2014 1027   PHURINE 5.5 02/02/2018 [place] 02/02/2018 1122   GLUCOSEU Negative 02/20/2014 [place] 02/02/2018 1122   BILIRUBINUR Negative 01/15/2021 1449  BILIRUBINUR Negative 02/20/2014 [place] 02/02/2018 1122   PROTEINUR Negative 01/15/2021 [place] 02/02/2018 1122   UROBILINOGEN 0.2 01/15/2021 1449   NITRITE Negative 01/15/2021 1449   NITRITE NEGATIVE 02/02/2018 1122   LEUKOCYTESUR Negative 01/15/2021 1449   LEUKOCYTESUR Negative 02/20/2014 1027    Pertinent Imaging: Urine normal.  Urine sent for culture.  Chart reviewed  Assessment & Plan: Reassurance given to treat infections as needed.  I do not think she needs any further work-up.  No blood in urine.  I do not think she needs prophylaxis unless the frequency of her urinary tract infections increases  1. Dysuria  - Urinalysis, Complete - CULTURE, URINE COMPREHENSIVE   No follow-ups on file.  Reece Packer, MD  [facility] [address], [place], [place] [address] [phone]

## 2021-10-30 NOTE — Progress Notes (Signed)
Cardiology Office Note    Date:  11/01/2021   ID:  [name], DOB 05-17-1945, MRN [id number]  PCP:  Steele Sizer, MD  Cardiologist:  Ida Rogue, MD  Electrophysiologist:  None   Chief Complaint: ED follow-up  History of Present Illness:   Kristie Walters is a 76 y.o. 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   She was previously followed by Dr. Clayborn Bigness though subsequently transitioned to Dr. Rockey Situ in 09/2017.  Prior echo from 09/2015, done by outside cardiology group, showed an EF greater than 55%, mild LVH, normal RV systolic function, mild MR/TR.  Nuclear stress test at that time showed an EF of 77% with no evidence of significant ischemia or scar.  She was referred to [facility] in 09/2017 for evaluation of incidentally noted aortic atherosclerosis and coronary artery calcium along the LAD on noninvasive imaging.  She was noted to not be very active at baseline secondary to back pain.  She did note some rare palpitations associated with stress that improved with rest.  Aggressive primary prevention was recommended.  She declined stress testing at that time.   She was seen in the ED in 02/2019 with palpitations and noted to be in new onset A. fib with RVR with LBBB (EMS EKG).  She had spontaneously converted to sinus rhythm in the field following IV metoprolol.  She was noted to have a new left bundle which persisted in sinus rhythm.  High-sensitivity troponin negative x2.   TSH mildly elevated at 5.041 with a potassium of 3.5.  She was placed on metoprolol and Eliquis.  Subsequent echo in 03/2019 showed an EF of 55 to 60%, no regional wall motion abnormalities, grade 2 diastolic dysfunction, normal RV systolic function and ventricular cavity size, normal size left atrium, and mildly elevated PASP.     She was seen on 01/05/2020 noting a 2-week history of increased palpitations as well as some associated dizziness, shortness of breath, and chest discomfort.  Lexiscan MPI on 01/11/2020 showed no evidence of significant ischemia or scar with a hyperdynamic LVEF greater than 65%.  With administration of regadenoson she developed a LBBB with known intermittent LBBB.  Attenuation corrected CT images noted aortic atherosclerosis with no significant coronary artery calcification.  Overall, this was a low risk study.  Zio patch showed a predominant rhythm of sinus with an average heart rate of 65 bpm.  First-degree AV block was noted.  18 episodes of SVT were noted with the longest interval lasting 10 beats with an average rate of 109 bpm and the fastest interval lasting 4 beats with a maximum rate of 158 bpm.  Isolated PACs, atrial couplets, atrial triplets, and PVCs were noted.  There were 6 patient triggered events and these were not associated with significant arrhythmia.  Echo in 02/2020 showed an EF of 55-60%, no RWMA, Gr1DD, normal RV systolic function and ventricular cavity size, and a PASP of 35.7 mmHg.   She was seen in the office in 5//2023 and was without symptoms of angina or decompensation.  She did report onset of left-sided palpitations and pinpoint chest discomfort that began after taking metoprolol titrate from a different manufacturer with symptoms typically lasting 1 to 2 minutes and spontaneously resolving.  With this, she self discontinued Lopressor and noted improvement, though not resolution of symptoms.  She was also under increased stress with the health of her son.   We underwent a trial of Toprol-XL 25 mg daily.   She was seen in the office in 07/2021 and was without symptoms of angina or decompensation.  She noted chronic stable exertional dyspnea.  Her main complaint at that time was fatigue that has been present for approximately 1 week.  Upon initiating metoprolol succinate, she noted resolution of palpitations.  Her son and daughter were doing well following stem cell donor/transplant.  She did not qualify for sleep study based on her STOP-BANG score.  She was last seen in the office in 09/2021 and was doing well from a cardiac perspective, without symptoms of angina or decompensation.  Her chronic exertional dyspnea was somewhat improved.  Fatigue was resolved.  Stress level was improved.  She was seen in the ED on 10/24/2021 with A-fib with RVR with rates ranging from 90 to 170 bpm with associated shortness of breath generalized weakness, fatigue, and chills.  EKG showed A-fib with RVR, 106 bpm, and known LBBB.  High-sensitivity troponin of 13 with a delta troponin of 20.  Chest x-ray showed emphysema with no acute cardiopulmonary process.  She spontaneously converted to sinus rhythm in the ED with recommendation to follow-up as an outpatient.  She comes in doing reasonably well from a cardiac perspective and is currently without symptoms of angina or decompensation.  A couple of days prior to her ED visit as outlined above, she developed some substernal chest discomfort that persisted throughout the evening.  She attributed this to indigestion following a dinner of enchiladas.  The following morning she was without symptoms of discomfort.  She was subsequently seen in the ED the following day for A-fib with RVR as outlined above.  Since her ED discharge, she has had a couple of paroxysms of palpitations, though no sustained episodes.  No further episodes of chest pain.  She has been fatigued since her episode of A-fib, though this is improving.  No dizziness,  presyncope, or syncope.  No lower extremity swelling.  She has not missed any doses of her anticoagulation.  No falls, hematochezia, or melena.   Labs independently reviewed: 10/2021 - Hgb 13.0, PLT 165, potassium 3.8, BUN 17, serum creatinine 0.97 07/2021 - TSH normal 04/2021 - TC 142, TG 194, HDL 34, LDL 79, albumin 4.4, AST normal, ALT 31  Past Medical History:  Diagnosis Date   Allergy    Atrial fibrillation ([place])    Basal cell carcinoma of nose 08/12/2017   Carotid artery disease (HCC)    s/p L. CEA in July 2016   Centrilobular emphysema ([place]) 09/29/2017   Collagen vascular disease ([place])    Left carotid stenosis   COPD (chronic obstructive pulmonary disease) (HCC)    Coronary artery disease 09/29/2017   Noted on chest CT July 2019   Dysrhythmia    Elevated blood pressure (not hypertension)    Fatty liver 09/29/2017   Chest CT July 2019   GERD (gastroesophageal reflux disease)    Hyperlipidemia    Personal history of tobacco use, presenting hazards to health 08/31/2015   PONV (postoperative nausea and vomiting)     Past Surgical History:  Procedure  Laterality Date   BREAST BIOPSY     COLONOSCOPY WITH PROPOFOL N/A 08/22/2020   Procedure: COLONOSCOPY WITH PROPOFOL;  Surgeon: Lucilla Lame, MD;  Location[facility]COPY;  Service: Endoscopy;  Laterality: N/A;   ENDARTERECTOMY Left 08/25/2014   Procedure: ENDARTERECTOMY CAROTID;  Surgeon: Algernon Huxley, MD;  Location: [facility] ORS;  Service: Vascular;  Laterality: Left;   MOHS SURGERY     MOHS SURGERY  09/23/2017   nose   PERIPHERAL VASCULAR CATHETERIZATION N/A 07/20/2014   Procedure: Carotid Angiography;  Surgeon: Algernon Huxley, MD;  Location: [facility] LAB;  Service: Cardiovascular;  Laterality: N/A;   TONSILLECTOMY     TUBAL LIGATION      Current Medications: Current Meds  Medication Sig   albuterol (VENTOLIN HFA) 108 (90 Base) MCG/ACT inhaler Inhale 2 puffs into the lungs every 6 (six) hours as needed for wheezing or  shortness of breath.   aspirin 81 MG tablet Take 81 mg by mouth daily.   atorvastatin (LIPITOR) 40 MG tablet TAKE 1 TABLET BY MOUTH EVERY DAY   calcium carbonate (OS-CAL - DOSED IN MG OF ELEMENTAL CALCIUM) 1250 (500 Ca) MG tablet Take 1 tablet by mouth.   cholecalciferol (VITAMIN D) 1000 units tablet Take 1,000 Units by mouth daily.   Cyanocobalamin (VITAMIN B-12) 5000 MCG SUBL Place under the tongue.   ELIQUIS 5 MG TABS tablet TAKE 1 TABLET BY MOUTH TWICE A DAY   ezetimibe (ZETIA) 10 MG tablet TAKE 1 TABLET BY MOUTH EVERY DAY   Fish Oil-Cholecalciferol (FISH OIL + D3 PO) Take 1,000 mg by mouth 1 day or 1 dose.   fluticasone (FLONASE) 50 MCG/ACT nasal spray Place 2 sprays into both nostrils daily.   metoprolol succinate (TOPROL XL) 25 MG 24 hr tablet Take 1 tablet (25 mg total) by mouth daily.   metoprolol tartrate (LOPRESSOR) 25 MG tablet Take 1 tablet (25 mg total) by mouth 2 (two) times daily as needed (As needed for palpitations).   metoprolol tartrate (LOPRESSOR) 50 MG tablet Take 1 tablet (50 mg total) by mouth once for 1 dose. Take 2 hours before testing   omeprazole (PRILOSEC) 40 MG capsule TAKE 1 CAPSULE (40 MG TOTAL) BY MOUTH AS NEEDED   pregabalin (LYRICA) 50 MG capsule Take 1 capsule (50 mg total) by mouth 3 (three) times daily.   TRELEGY ELLIPTA 100-62.5-25 MCG/ACT AEPB TAKE 1 PUFF BY MOUTH EVERY DAY    Allergies:   Morphine, Morphine and related, and Zoster vaccine live   Social History   Socioeconomic History   Marital status: Divorced    Spouse name: Not on file   Number of children: 2   Years of education: Not on file   Highest education level: 10th grade  Occupational History   Occupation: Retired  Tobacco Use   Smoking status: Former    Packs/day: 1.00    Years: 55.00    Total pack years: 55.00    Types: Cigarettes    Quit date: 11/26/2016    Years since quitting: 4.9    Passive exposure: Past   Smokeless tobacco: Former    Types: Snuff   Tobacco comments:     smoking cessation materials not required  Vaping Use   Vaping Use: Never used  Substance and Sexual Activity   Alcohol use: No   Drug use: No   Sexual activity: Not Currently  Other Topics Concern   Not on file  Social History Narrative    Pt lives alone  Social Determinants of Health   Financial Resource Strain: Medium Risk (08/09/2021)   Overall Financial Resource Strain (CARDIA)    Difficulty of Paying Living Expenses: Somewhat hard  Food Insecurity: No Food Insecurity (08/09/2021)   Hunger Vital Sign    Worried About Running Out of Food in the Last Year: Never true    Ran Out of Food in the Last Year: Never true  Transportation Needs: No Transportation Needs (08/09/2021)   PRAPARE - Hydrologist (Medical): No    Lack of Transportation (Non-Medical): No  Physical Activity: Insufficiently Active (08/09/2021)   Exercise Vital Sign    Days of Exercise per Week: 1 day    Minutes of Exercise per Session: 10 min  Stress: No Stress Concern Present (08/09/2021)   [place]    Feeling of Stress : Only a little  Social Connections: Moderately Isolated (08/08/2020)   Social Connection and Isolation Panel [NHANES]    Frequency of Communication with Friends and Family: More than three times a week    Frequency of Social Gatherings with Friends and Family: Three times a week    Attends Religious Services: More than 4 times per year    Active Member of Clubs or Organizations: No    Attends Archivist Meetings: Never    Marital Status: Divorced     Family History:  The patient's family history includes Alzheimer's disease in her brother; CVA in her father; Dementia in her mother; Diabetes in her brother and brother; HIV in her son; Heart attack in her mother; Hypercholesterolemia in her mother; Hypertension in her mother; Hypothyroidism in her mother; Kidney cancer in her sister;  Liver cancer in her father; Lymphoma in her son; Other in her brother; Peripheral vascular disease in her mother. There is no history of Breast cancer.  ROS:   12-point review of systems is negative unless otherwise noted in the HPI.   EKGs/Labs/Other Studies Reviewed:    Studies reviewed were summarized above. The additional studies were reviewed today:  2D echo 09/04/2021: 1. Left ventricular ejection fraction, by estimation, is 55 to 60%. The  left ventricle has normal function. The left ventricle has no regional  wall motion abnormalities. Left ventricular diastolic parameters are  consistent with Grade I diastolic  dysfunction (impaired relaxation).   2. Right ventricular systolic function is normal. The right ventricular  size is normal. Tricuspid regurgitation signal is inadequate for assessing  PA pressure.   3. The mitral valve is normal in structure. No evidence of mitral valve  regurgitation. No evidence of mitral stenosis.   4. The aortic valve was not well visualized. Aortic valve regurgitation  is not visualized. No aortic stenosis is present.   5. The inferior vena cava is normal in size with greater than 50%  respiratory variability, suggesting right atrial pressure of 3 mmHg.   Comparison(s): 02/2020-EF 55-60%. __________   Carotid artery ultrasound 07/20/2021: Summary:  Right Carotid: Velocities in the right ICA are consistent with a 1-39% stenosis.   Left Carotid: Velocities in the left ICA are consistent with a 1-39%  stenosis.   Vertebrals:  Bilateral vertebral arteries demonstrate antegrade flow.  Subclavians: Normal flow hemodynamics were seen in bilateral subclavian arteries. ___________   2D echo 03/09/2020: 1. Left ventricular ejection fraction, by estimation, is 55 to 60%. The  left ventricle has normal function. The left ventricle has no regional  wall motion abnormalities. Left ventricular diastolic  parameters are  consistent with Grade I diastolic   dysfunction (impaired relaxation).   2. Right ventricular systolic function is normal. The right ventricular  size is normal. There is normal pulmonary artery systolic pressure. The  estimated right ventricular systolic pressure is 16.1 mmHg.   3. The aortic valve was not well visualized. __________   Carlton Adam MPI 01/11/2020: Normal pharmacologic myocardial perfusion stress test without evidence of significant ischemia or scar. The left ventricular ejection fraction is hyperdynamic (>65%). Left bundle branch block developed after administration of regadenoson. Patient known to have intermittent LBBB. Attenuation correction CT is notable for aortic atherosclerosis. There is no significant coronary artery calcification. This is a low risk study. __________   Elwyn Reach patch 12/2019: Normal sinus rhythm avg HR of 65 bpm.    Patient triggered events (6) were not associated with significant arrhythmia.   First Degree AV Block was present.  18 Supraventricular Tachycardia runs occurred, the run with the fastest interval lasting 4 beats with a max rate of 158 bpm, the longest lasting 10 beats with an avg rate of 109 bpm.   Isolated SVEs were rare (<1.0%), SVE Couplets were rare (<1.0%), and SVE Triplets were rare (<1.0%). Isolated VEs were rare (<1.0%), and no VE Couplets or VE Triplets were present. __________   2D echo 03/2019: 1. Left ventricular ejection fraction, by visual estimation, is 55 to  60%. The left ventricle has normal function. There is no left ventricular  hypertrophy.   2. Left ventricular diastolic parameters are consistent with Grade II  diastolic dysfunction (pseudonormalization).   3. The left ventricle has no regional wall motion abnormalities.   4. Global right ventricle has normal systolic function.The right  ventricular size is normal. No increase in right ventricular wall  thickness.   5. Left atrial size was normal.   6. Mildly elevated pulmonary artery  systolic pressure.   EKG:  EKG is ordered today.  The EKG ordered today demonstrates NSR, 65 bpm, LBBB (known)  Recent Labs: 04/18/2021: ALT 31 08/03/2021: TSH 3.494 10/24/2021: BUN 17; Creatinine, Ser 0.97; Hemoglobin 13.0; Platelets 165; Potassium 3.8; Sodium 140  Recent Lipid Panel    Component Value Date/Time   CHOL 142 04/18/2021 1423   CHOL 155 05/08/2015 0927   TRIG 194 (H) 04/18/2021 1423   HDL 34 (L) 04/18/2021 1423   HDL 30 (L) 05/08/2015 0927   CHOLHDL 4.2 04/18/2021 1423   LDLCALC 79 04/18/2021 1423    PHYSICAL EXAM:    VS:  BP 118/62 (BP Location: Left Arm, Patient Position: Sitting, Cuff Size: Normal)   Pulse 65   Ht '5\' 11"'$  (1.803 m)   Wt 166 lb 9.6 oz (75.6 kg)   SpO2 95%   BMI 23.24 kg/m   BMI: Body mass index is 23.24 kg/m.  Physical Exam Vitals reviewed.  Constitutional:      Appearance: She is well-developed.  HENT:     Head: Normocephalic and atraumatic.  Eyes:     General:        Right eye: No discharge.        Left eye: No discharge.  Neck:     Vascular: No JVD.  Cardiovascular:     Rate and Rhythm: Normal rate and regular rhythm.     Pulses:          Posterior tibial pulses are 2+ on the right side and 2+ on the left side.     Heart sounds: Normal heart sounds, S1 normal and S2 normal.  Heart sounds not distant. No midsystolic click and no opening snap. No murmur heard.    No friction rub.  Pulmonary:     Effort: Pulmonary effort is normal. No respiratory distress.     Breath sounds: Normal breath sounds. No decreased breath sounds, wheezing or rales.  Chest:     Chest wall: No tenderness.  Abdominal:     General: There is no distension.  Musculoskeletal:     Cervical back: Normal range of motion.     Right lower leg: No edema.     Left lower leg: No edema.  Skin:    General: Skin is warm and dry.     Nails: There is no clubbing.  Neurological:     Mental Status: She is alert and oriented to person, place, and time.  Psychiatric:         Speech: Speech normal.        Behavior: Behavior normal.        Thought Content: Thought content normal.        Judgment: Judgment normal.     Wt Readings from Last 3 Encounters:  11/01/21 166 lb 9.6 oz (75.6 kg)  10/29/21 170 lb (77.1 kg)  10/24/21 170 lb (77.1 kg)     ASSESSMENT & PLAN:   PAF/PSVT: Maintaining sinus rhythm in the office today.  This is now her second episode of A-fib.  We will add Lopressor 25 mg twice daily as needed sustained tachypalpitations.  She will continue Toprol-XL 25 mg daily.  If she has further paroxysms of A-fib, could consider addition of amiodarone for rhythm control strategy.  CHA2DS2-VASc at least 3.  She remains on apixaban 5 mg twice daily and does not meet reduced dosing criteria.  Recent CBC and BMP stable.  CAD involving the native coronary arteries precordial pain and elevated high-sensitivity troponin: Currently chest pain-free.  She did have an episode of chest discomfort that preceded her episode of A-fib.  Minimal elevation in high-sensitivity troponin, likely in the context of A-fib with RVR.  However, given episode of chest discomfort that preceded her episode, we will pursue coronary CTA.  Recent echo in 08/2021 showed preserved LV systolic function with normal wall motion.  Continue current medical therapy including aspirin, atorvastatin, ezetimibe, and metoprolol succinate.  Diastolic dysfunction: She appears euvolemic and well compensated.  Her weight is down 3 pounds when compared to her last clinic visit.  Continue optimal blood pressure control.  Not requiring a standing diuretic.  LBBB: Chronic and stable.  No symptoms of presyncope or syncope.  Prior echo demonstrated preserved LV systolic function with Lexiscan MPI in 2021 showing no evidence of ischemia.  Continue to monitor.  HLD: LDL 79.  She remains on atorvastatin and ezetimibe.  Carotid artery disease: Status post left-sided CEA in 08/2014.  Carotid artery ultrasound from  07/2021 demonstrated bilateral 1 to 39% stenoses.  She remains on aspirin and atorvastatin.  Follow-up with vascular surgery as directed.  COPD: Stable.  Follow-up with pulmonology as directed.    Disposition: F/u with Dr. Rockey Situ or an APP in 1 month.   Medication Adjustments/Labs and Tests Ordered: Current medicines are reviewed at length with the patient today.  Concerns regarding medicines are outlined above. Medication changes, Labs and Tests ordered today are summarized above and listed in the Patient Instructions accessible in Encounters.   Signed, Christell Faith, PA-C 11/01/2021 11:55 AM     [place], [place]  [phone] 

## 2021-11-01 ENCOUNTER — Ambulatory Visit: Payer: Medicare Other | Attending: Physician Assistant | Admitting: Physician Assistant

## 2021-11-01 ENCOUNTER — Encounter: Payer: Self-pay | Admitting: Physician Assistant

## 2021-11-01 VITALS — BP 118/62 | HR 65 | Ht 71.0 in | Wt 166.6 lb

## 2021-11-01 DIAGNOSIS — I471 Supraventricular tachycardia: Secondary | ICD-10-CM | POA: Diagnosis not present

## 2021-11-01 DIAGNOSIS — I25118 Atherosclerotic heart disease of native coronary artery with other forms of angina pectoris: Secondary | ICD-10-CM

## 2021-11-01 DIAGNOSIS — I6523 Occlusion and stenosis of bilateral carotid arteries: Secondary | ICD-10-CM

## 2021-11-01 DIAGNOSIS — I480 Paroxysmal atrial fibrillation: Secondary | ICD-10-CM | POA: Diagnosis not present

## 2021-11-01 DIAGNOSIS — R072 Precordial pain: Secondary | ICD-10-CM | POA: Diagnosis not present

## 2021-11-01 DIAGNOSIS — I447 Left bundle-branch block, unspecified: Secondary | ICD-10-CM

## 2021-11-01 DIAGNOSIS — E785 Hyperlipidemia, unspecified: Secondary | ICD-10-CM

## 2021-11-01 DIAGNOSIS — I5189 Other ill-defined heart diseases: Secondary | ICD-10-CM

## 2021-11-01 LAB — CULTURE, URINE COMPREHENSIVE

## 2021-11-01 MED ORDER — METOPROLOL TARTRATE 25 MG PO TABS: 25.0000 mg | ORAL_TABLET | Freq: Two times a day (BID) | ORAL | 3 refills | Status: DC | PRN

## 2021-11-01 MED ORDER — METOPROLOL TARTRATE 50 MG PO TABS: 50.0000 mg | ORAL_TABLET | Freq: Once | ORAL | 0 refills | Status: DC

## 2021-11-01 NOTE — Patient Instructions (Addendum)
Medication Instructions:  Your physician has recommended you make the following change in your medication:   AS NEEDED Metoprolol 25 mg twice daily as needed for palpitations.   *If you need a refill on your cardiac medications before your next appointment, please call your pharmacy*   Lab Work: None  If you have labs (blood work) drawn today and your tests are completely normal, you will receive your results only by: [place] (if you have MyChart) OR A paper copy in the mail If you have any lab test that is abnormal or we need to change your treatment, we will call you to review the results.   Testing/Procedures:   Your cardiac CT are scheduled at the below location:   [facility] [place], [place] [address] [phone]  Scheduled for 11/12/2021 at 1:45 pm  Please arrive 15 mins early for check-in and test prep.   On the Night Before the Test: Be sure to Drink plenty of water. Do not consume any caffeinated/decaffeinated beverages or chocolate 12 hours prior to your test. Do not take any antihistamines 12 hours prior to your test.   On the Day of the Test: Drink plenty of water until 1 hour prior to the test. You may take your regular medications prior to the test.  Take metoprolol (Lopressor) 50 mg two hours prior to test. HOLD Furosemide/Hydrochlorothiazide morning of the test. FEMALES- please wear underwire-free bra if available, avoid dresses & tight clothing        After the Test: Drink plenty of water. After receiving IV contrast, you may experience a mild flushed feeling. This is normal. On occasion, you may experience a mild rash up to 24 hours after the test. This is not dangerous. If this occurs, you can take Benadryl 25 mg and increase your fluid intake. If you experience trouble breathing, this can be serious. If it is severe call 911 IMMEDIATELY. If it is mild, please call our  office. If you take any of these medications: Glipizide/Metformin, Avandament, Glucavance, please do not take 48 hours after completing test unless otherwise instructed.   For non-scheduling related questions, please contact the cardiac imaging nurse navigator should you have any questions/concerns: Marchia Bond, Cardiac Imaging Nurse Navigator Gordy Clement, Cardiac Imaging Nurse Navigator [facility] Direct Office Dial: [phone]   For scheduling needs, including cancellations and rescheduling, please call [place], [phone].    Follow-Up: At [facility], you and your health needs are our priority.  As part of our continuing mission to provide you with exceptional heart care, we have created designated Provider Care Teams.  These Care Teams include your primary Cardiologist (physician) and Advanced Practice Providers (APPs -  Physician Assistants and Nurse Practitioners) who all work together to provide you with the care you need, when you need it.  We recommend signing up for the patient portal called "MyChart".  Sign up information is provided on this After Visit Summary.  MyChart is used to connect with patients for Virtual Visits (Telemedicine).  Patients are able to view lab/test results, encounter notes, upcoming appointments, etc.  Non-urgent messages can be sent to your provider as well.   To learn more about what you can do with MyChart, go to [email or web address].    Your next appointment:   1 month(s)  The format for your next appointment:   In Person  Provider:   Ida Rogue, MD or Christell Faith, PA-C  Important Information About Sugar

## 2021-11-09 ENCOUNTER — Telehealth (HOSPITAL_COMMUNITY): Payer: Self-pay | Admitting: *Deleted

## 2021-11-09 NOTE — Telephone Encounter (Signed)
Reaching out to patient to offer assistance regarding upcoming cardiac imaging study; pt verbalizes understanding of appt date/time, parking situation and where to check in, medications ordered, and verified current allergies; name and call back number provided for further questions should they arise  Gordy Clement RN Navigator Cardiac Imaging [facility] [phone] office [phone] cell  Patient to take '50mg'$  metoprolol tartrate two hours prior to her cardiac CT scan.

## 2021-11-12 ENCOUNTER — Ambulatory Visit
Admission: RE | Admit: 2021-11-12 | Discharge: 2021-11-12 | Disposition: A | Discharge status: Home / Self Care | Payer: Medicare Other | Source: Ambulatory Visit | Attending: Physician Assistant | Admitting: Physician Assistant

## 2021-11-12 DIAGNOSIS — R072 Precordial pain: Secondary | ICD-10-CM

## 2021-11-12 MED ORDER — NITROGLYCERIN 0.4 MG SL SUBL: 0.8000 mg | SUBLINGUAL_TABLET | Freq: Once | SUBLINGUAL | Status: DC

## 2021-11-12 MED ORDER — IOHEXOL 350 MG/ML SOLN: 100.0000 mL | Freq: Once | INTRAVENOUS | Status: DC | PRN

## 2021-11-12 NOTE — Progress Notes (Signed)
Peripheral IV insertion not successful by 2 RN's and RT. Pt informed staff that peripheral IV's are hard to find on herself. Pt states she has to leave to see about her son due to him having medical issues. Pt told that we would have to reschedule her exam, pt agreeable. Pt wheeled out to lobby to meet family to be transported home.

## 2021-11-13 ENCOUNTER — Other Ambulatory Visit (HOSPITAL_COMMUNITY): Payer: Self-pay | Admitting: *Deleted

## 2021-11-13 MED ORDER — METOPROLOL TARTRATE 50 MG PO TABS: 50.0000 mg | ORAL_TABLET | Freq: Once | ORAL | 0 refills | Status: DC

## 2021-11-14 ENCOUNTER — Telehealth (HOSPITAL_COMMUNITY): Payer: Self-pay | Admitting: *Deleted

## 2021-11-14 NOTE — Telephone Encounter (Signed)
Reaching out to patient to offer assistance regarding upcoming cardiac imaging study; pt's daughter verbalizes understanding of appt date/time, parking situation and where to check in, medications ordered, and verified current allergies; name and call back number provided for further questions should they arise  Gordy Clement RN Navigator Cardiac Imaging [facility] [phone] office [phone] cell  Patient to take '50mg'$  metoprolol tartrate two hours prior to her cardiac CT scan.

## 2021-11-15 ENCOUNTER — Ambulatory Visit
Admission: RE | Admit: 2021-11-15 | Discharge: 2021-11-15 | Disposition: A | Discharge status: Home / Self Care | Payer: Medicare Other | Source: Ambulatory Visit | Attending: Physician Assistant | Admitting: Physician Assistant

## 2021-11-15 DIAGNOSIS — R072 Precordial pain: Secondary | ICD-10-CM | POA: Insufficient documentation

## 2021-11-15 MED ORDER — METOPROLOL TARTRATE 5 MG/5ML IV SOLN
10.0000 mg | Freq: Once | INTRAVENOUS | Status: AC
  Administered 2021-11-15: 10 mg via INTRAVENOUS

## 2021-11-15 MED ORDER — IOHEXOL 350 MG/ML SOLN
75.0000 mL | Freq: Once | INTRAVENOUS | Status: AC | PRN
  Administered 2021-11-15: 75 mL via INTRAVENOUS

## 2021-11-15 MED ORDER — NITROGLYCERIN 0.4 MG SL SUBL
0.8000 mg | SUBLINGUAL_TABLET | Freq: Once | SUBLINGUAL | Status: AC
  Administered 2021-11-15: 0.8 mg via SUBLINGUAL

## 2021-11-15 NOTE — Progress Notes (Signed)
Patient tolerated CT well. Drank water after. Vital signs stable encourage to drink water throughout day.Reasons explained and verbalized understanding. Ambulated steady with single pole cane.

## 2021-11-22 ENCOUNTER — Ambulatory Visit: Admission: RE | Admit: 2021-11-22 | Payer: Medicare Other | Source: Ambulatory Visit

## 2021-11-30 ENCOUNTER — Other Ambulatory Visit: Payer: Self-pay | Admitting: Cardiovascular Disease

## 2021-11-30 ENCOUNTER — Other Ambulatory Visit: Payer: Self-pay | Admitting: Family Medicine

## 2021-11-30 DIAGNOSIS — K219 Gastro-esophageal reflux disease without esophagitis: Secondary | ICD-10-CM

## 2021-11-30 NOTE — Telephone Encounter (Signed)
Prescription refill request for Eliquis received. Indication:Afib Last office visit:9/23 Scr:0.9 Age: 76 Weight:75.6 kg  Prescription refilled

## 2021-11-30 NOTE — Telephone Encounter (Signed)
Refill request

## 2021-12-11 NOTE — Progress Notes (Unsigned)
Name: Kristie Walters   MRN: [id number]    DOB: 01/19/1946   Date:12/12/2021       Progress Note  Subjective  Chief Complaint  Follow Up  HPI  Abdominal distention/ Bloating: Patient had colonoscopy in 08/22/2020 and states they found 5 polyps and was advised to follow high fiber diet to help with bloating. She had CT abdomen that showed fatty liver and hepatomegaly, she has been losing weight and advised her to go to back to GI for further evaluation   GERD: taking omeprazole and symptoms have improved.   Neuropathy : it  causes some balance problems, uses a cane when not at home . She states it bothers her at night. 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 She states symptoms  was getting worse , she was seen by Dr. Matilde Sprang had normal urinalysis , she was not happy with the visit and does not want to go back at this time   COPD/emphysema: she  has a pulmonologist and is continuing with Trelegy.She states hoarseness resolved, she states wheezing has been under control She has SOB with activity   Vitamin D and B12 deficiency: taking supplementations and last levels at goal . Unchanged   Senile purpura: legs and intermittent on arms, but stable and reassurance given   Paroxysmal Afib.:Still taking medications as prescribed. She has mild intermittent palpitation,  Dr. Rockey Situ gave her Toprol XL to take daily and metoprolol tartrate 25 mg to take prn palpitation. 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 She states feels sleepy during the day, had labs done recently by cardiologist and will have an Echo done, we will  try to skip Lyrica in am to see if fatigue improves. Explained it may also be due to bp being towards low end normal. We may need to consider discussing with cardiologist about cutting metoprolol dose in half She uses a cane   Aorta Atherosclerosis: she is on statin therapy, found on CT of lung  Osteoporosis : found on CT done 08/2017 , discussed therapy with patient today , she had Reclast infusion back in 2020 but due to pain for 3 days after the infusion she decided not to go back.  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   Fatty liver 09/29/2017   Coronary artery disease 09/29/2017   Centrilobular emphysema ([place]) 09/29/2017   Basal cell carcinoma of nose 08/12/2017   Estrogen deficiency 08/01/2017   Aortic atherosclerosis ([place]) 09/14/2015   Personal history of tobacco use, presenting hazards to health 08/31/2015   Paresthesia of foot, bilateral 08/15/2015  Breast mass, right 07/12/2015   Elevated alkaline phosphatase level 05/29/2015   COPD with chronic bronchitis and emphysema ([place]) 04/26/2015   GERD (gastroesophageal reflux disease) 04/26/2015   Hyperlipidemia 04/26/2015   Hyperglycemia 04/26/2015   Carotid stenosis 08/25/2014   H/O malignant neoplasm of skin 08/13/2012    Past Surgical History:  Procedure Laterality Date   BREAST BIOPSY     COLONOSCOPY WITH PROPOFOL N/A 08/22/2020   Procedure: COLONOSCOPY WITH PROPOFOL;  Surgeon: Lucilla Lame, MD;  Location: [facility] ENDOSCOPY;   Service: Endoscopy;  Laterality: N/A;   ENDARTERECTOMY Left 08/25/2014   Procedure: ENDARTERECTOMY CAROTID;  Surgeon: Algernon Huxley, MD;  Location: [facility] ORS;  Service: Vascular;  Laterality: Left;   MOHS SURGERY     MOHS SURGERY  09/23/2017   nose   PERIPHERAL VASCULAR CATHETERIZATION N/A 07/20/2014   Procedure: Carotid Angiography;  Surgeon: Algernon Huxley, MD;  Location: [facility] LAB;  Service: Cardiovascular;  Laterality: N/A;   TONSILLECTOMY     TUBAL LIGATION      Family History  Problem Relation Age of Onset   Heart attack Mother    Hypercholesterolemia Mother    Hypertension Mother    Peripheral vascular disease Mother    Dementia Mother    Hypothyroidism Mother    CVA Father    Liver cancer Father    Diabetes Brother    Kidney cancer Sister    Diabetes Brother    Alzheimer's disease Brother    Other Brother        alzheimers   Lymphoma Son    HIV Son    Breast cancer Neg Hx     Social History   Tobacco Use   Smoking status: Former    Packs/day: 1.00    Years: 55.00    Total pack years: 55.00    Types: Cigarettes    Quit date: 11/26/2016    Years since quitting: 5.0    Passive exposure: Past   Smokeless tobacco: Former    Types: Snuff   Tobacco comments:    smoking cessation materials not required  Substance Use Topics   Alcohol use: No     Current Outpatient Medications:    albuterol (VENTOLIN HFA) 108 (90 Base) MCG/ACT inhaler, Inhale 2 puffs into the lungs every 6 (six) hours as needed for wheezing or shortness of breath., Disp: 8 g, Rfl: 2   aspirin 81 MG tablet, Take 81 mg by mouth daily., Disp: , Rfl:    atorvastatin (LIPITOR) 40 MG tablet, TAKE 1 TABLET BY MOUTH EVERY DAY, Disp: 90 tablet, Rfl: 3   calcium carbonate (OS-CAL - DOSED IN MG OF ELEMENTAL CALCIUM) 1250 (500 Ca) MG tablet, Take 1 tablet by mouth., Disp: , Rfl:    cholecalciferol (VITAMIN D) 1000 units tablet, Take 1,000 Units by mouth daily., Disp: , Rfl:    Cyanocobalamin (VITAMIN  B-12) 5000 MCG SUBL, Place under the tongue., Disp: , Rfl:    ELIQUIS 5 MG TABS tablet, TAKE 1 TABLET BY MOUTH TWICE A DAY, Disp: 180 tablet, Rfl: 1   ezetimibe (ZETIA) 10 MG tablet, TAKE 1 TABLET BY MOUTH EVERY DAY, Disp: 90 tablet, Rfl: 3   Fish Oil-Cholecalciferol (FISH OIL + D3 PO), Take 1,000 mg by mouth 1 day or 1 dose., Disp: , Rfl:    fluticasone (FLONASE) 50 MCG/ACT nasal spray, Place 2 sprays into both nostrils daily., Disp: 16 g, Rfl: 6   metoprolol succinate (TOPROL XL) 25 MG 24 hr tablet, Take 1  tablet (25 mg total) by mouth daily., Disp: 90 tablet, Rfl: 3   metoprolol tartrate (LOPRESSOR) 25 MG tablet, Take 1 tablet (25 mg total) by mouth 2 (two) times daily as needed (As needed for palpitations)., Disp: 180 tablet, Rfl: 3   omeprazole (PRILOSEC) 40 MG capsule, TAKE 1 CAPSULE (40 MG TOTAL) BY MOUTH AS NEEDED, Disp: 90 capsule, Rfl: 0   pregabalin (LYRICA) 50 MG capsule, Take 1 capsule (50 mg total) by mouth 3 (three) times daily., Disp: 270 capsule, Rfl: 1   TRELEGY ELLIPTA 100-62.5-25 MCG/ACT AEPB, TAKE 1 PUFF BY MOUTH EVERY DAY, Disp: 60 each, Rfl: 11   metoprolol tartrate (LOPRESSOR) 50 MG tablet, Take 1 tablet (50 mg total) by mouth once for 1 dose. Take 2 hours before testing, Disp: 1 tablet, Rfl: 0  Allergies  Allergen Reactions   Morphine Nausea Only and Nausea And Vomiting   Morphine And Related Nausea And Vomiting   Zoster Vaccine Live Itching and Rash    I personally reviewed active problem list, medication list, allergies, family history, social history, health maintenance with the patient/caregiver today.   ROS  Constitutional: Negative for fever, positive for weight change.  Respiratory: Negative for cough , positive for shortness of breath.   Cardiovascular: Negative for chest pain or palpitations.  Gastrointestinal: Negative for abdominal pain, no bowel changes.  Musculoskeletal:  positive for gait problem , no  joint swelling.  Skin: Negative for rash.   Neurological: Negative for dizziness or headache.  No other specific complaints in a complete review of systems (except as listed in HPI above).   Objective  Vitals:   12/12/21 1022  BP: 112/66  Pulse: 85  Resp: 16  Temp: 98.1 F (36.7 C)  TempSrc: Oral  SpO2: 92%  Weight: 166 lb 9.6 oz (75.6 kg)  Height: '5\' 11"'$  (1.803 m)    Body mass index is 23.24 kg/m.  Physical Exam  Constitutional: Patient appears well-developed and well-nourished. No distress.  HEENT: head atraumatic, normocephalic, pupils equal and reactive to light, neck supple, throat within normal limits Cardiovascular: Normal rate, regular rhythm and normal heart sounds.  No murmur heard. No BLE edema. Pulmonary/Chest: Effort normal and breath sounds normal. No respiratory distress. Abdominal: Soft.  There is no tenderness. Psychiatric: Patient has a normal mood and affect. behavior is normal. Judgment and thought content normal.   Recent Results (from the past 2160 hour(s))  Basic metabolic panel     Status: Abnormal   Collection Time: 10/24/21 10:30 AM  Result Value Ref Range   Sodium 140 135 - 145 mmol/L   Potassium 3.8 3.5 - 5.1 mmol/L   Chloride 111 98 - 111 mmol/L   CO2 23 22 - 32 mmol/L   Glucose, Bld 203 (H) 70 - 99 mg/dL    Comment: Glucose reference range applies only to samples taken after fasting for at least 8 hours.   BUN 17 8 - 23 mg/dL   Creatinine, Ser 0.97 0.44 - 1.00 mg/dL   Calcium 8.4 (L) 8.9 - 10.3 mg/dL   GFR, Estimated >60 >60 mL/min    Comment: (NOTE) Calculated using the CKD-EPI Creatinine Equation (2021)    Anion gap 6 5 - 15    Comment: Performed at [facility], [address] [place], [place] [address]  CBC     Status: None   Collection Time: 10/24/21 10:30 AM  Result Value Ref Range   WBC 6.7 4.0 - 10.5 K/uL   RBC 4.60 3.87 -  5.11 MIL/uL   Hemoglobin 13.0 12.0 - 15.0 g/dL   HCT 40.3 36.0 - 46.0 %   MCV 87.6 80.0 - 100.0 fL   MCH 28.3 26.0 - 34.0 pg   MCHC  32.3 30.0 - 36.0 g/dL   RDW 14.0 11.5 - 15.5 %   Platelets 165 150 - 400 K/uL   nRBC 0.0 0.0 - 0.2 %    Comment: Performed at [facility], [address], [place], [place] [address]  Troponin I (High Sensitivity)     Status: None   Collection Time: 10/24/21 10:30 AM  Result Value Ref Range   Troponin I (High Sensitivity) 13 <18 ng/L    Comment: (NOTE) Elevated high sensitivity troponin I (hsTnI) values and significant  changes across serial measurements may suggest ACS but many other  chronic and acute conditions are known to elevate hsTnI results.  Refer to the "Links" section for chest pain algorithms and additional  guidance. Performed at [facility], [address] [place], [place] [address]   Lipase, blood     Status: None   Collection Time: 10/24/21 10:30 AM  Result Value Ref Range   Lipase 33 11 - 51 U/L    Comment: Performed at [facility], [address] [place], [place] [address]  SARS Coronavirus 2 by RT PCR (hospital order, performed in [facility] hospital lab) *cepheid single result test* Anterior Nasal Swab     Status: None   Collection Time: 10/24/21 10:30 AM   Specimen: Anterior Nasal Swab  Result Value Ref Range   SARS Coronavirus 2 by RT PCR NEGATIVE NEGATIVE    Comment: (NOTE) SARS-CoV-2 target nucleic acids are NOT DETECTED.  The SARS-CoV-2 RNA is generally detectable in upper and lower respiratory specimens during the acute phase of infection. The lowest concentration of SARS-CoV-2 viral copies this assay can detect is 250 copies / mL. A negative result does not preclude SARS-CoV-2 infection and should not be used as the sole basis for treatment or other patient management decisions.  A negative result may occur with improper specimen collection / handling, submission of specimen other than nasopharyngeal swab, presence of viral mutation(s) within the areas targeted by this assay, and inadequate number of viral  copies (<250 copies / mL). A negative result must be combined with clinical observations, patient history, and epidemiological information.  Fact Sheet for Patients:   https://www.patel.info/  Fact Sheet for Healthcare Providers: https://hall.com/  This test is not yet approved or  cleared by the [place] FDA and has been authorized for detection and/or diagnosis of SARS-CoV-2 by FDA under an Emergency Use Authorization (EUA).  This EUA will remain in effect (meaning this test can be used) for the duration of the COVID-19 declaration under Section 564(b)(1) of the Act, 21 U.S.C. section 360bbb-3(b)(1), unless the authorization is terminated or revoked sooner.  Performed at [facility], [place], [place] [address]   Troponin I (High Sensitivity)     Status: Abnormal   Collection Time: 10/24/21  1:00 PM  Result Value Ref Range   Troponin I (High Sensitivity) 20 (H) <18 ng/L    Comment: (NOTE) Elevated high sensitivity troponin I (hsTnI) values and significant  changes across serial measurements may suggest ACS but many other  chronic and acute conditions are known to elevate hsTnI results.  Refer to the "Links" section for chest pain algorithms and additional  guidance. Performed at [facility], [place], [place]  [address]   Urinalysis, Complete     Status: Abnormal   Collection Time: 10/29/21 10:57 AM  Result Value Ref Range   Specific Gravity, UA <1.005 (L) 1.005 - 1.030   pH, UA 5.5 5.0 - 7.5   Color, UA Yellow Yellow   Appearance Ur Clear Clear   Leukocytes,UA Negative Negative   Protein,UA Negative Negative/Trace   Glucose, UA Negative Negative   Ketones, UA Negative Negative   RBC, UA Trace (A) Negative   Bilirubin, UA Negative Negative   Urobilinogen, Ur 0.2 0.2 - 1.0 mg/dL   Nitrite, UA Negative Negative   Microscopic Examination See below:   CULTURE, URINE  COMPREHENSIVE     Status: None   Collection Time: 10/29/21 10:57 AM   Specimen: Urine   UR  Result Value Ref Range   Urine Culture, Comprehensive Final report    Organism ID, Bacteria Comment     Comment: Mixed urogenital flora 5,000  Colonies/mL   Microscopic Examination     Status: None   Collection Time: 10/29/21 10:57 AM   Urine  Result Value Ref Range   WBC, UA 0-5 0 - 5 /hpf   RBC, Urine 0-2 0 - 2 /hpf   Epithelial Cells (non renal) 0-10 0 - 10 /hpf   Bacteria, UA None seen None seen/Few    PHQ2/9:    12/12/2021   10:26 AM 08/15/2021   10:08 AM 08/09/2021    9:23 AM 04/18/2021    1:29 PM 01/15/2021    1:57 PM  Depression screen PHQ 2/9  Decreased Interest 0 0 0 0 0  Down, Depressed, Hopeless 1 0 0 0 0  PHQ - 2 Score 1 0 0 0 0  Altered sleeping 1   0 1  Tired, decreased energy 0   0 1  Change in appetite 0   0 0  Feeling bad or failure about yourself  0   0 0  Trouble concentrating 0   0 0  Moving slowly or fidgety/restless 0   0 0  Suicidal thoughts 0   0 0  PHQ-9 Score 2   0 2  Difficult doing work/chores Not difficult at all   Not difficult at all     phq 9 is negative   Fall Risk:    12/12/2021   10:24 AM 08/15/2021   10:07 AM 08/09/2021    9:23 AM 04/18/2021    1:29 PM 01/15/2021    1:56 PM  Fall Risk   Falls in the past year? 0 0 0 0 0  Number falls in past yr:  0 0 0 0  Injury with Fall?  0 0 0 0  Risk for fall due to : Impaired balance/gait Impaired balance/gait  No Fall Risks No Fall Risks  Follow up Falls prevention discussed;Education provided;Falls evaluation completed Falls prevention discussed Falls evaluation completed Education provided Falls prevention discussed      Functional Status Survey: Is the patient deaf or have difficulty hearing?: Yes Does the patient have difficulty seeing, even when wearing glasses/contacts?: No Does the patient have difficulty concentrating, remembering, or making decisions?: No Does the patient have  difficulty walking or climbing stairs?: Yes Does the patient have difficulty dressing or bathing?: No Does the patient have difficulty doing errands alone such as visiting a doctor's office or shopping?: No   Assessment & Plan  1. Hepatomegaly  Referral to GI   2. Fatty liver  Referral to GI  3. Senile purpura ([place])  Reassurance given   4. Coronary artery disease involving native coronary artery of native heart with angina pectoris [facility])  Seeing cardiologist , on eliquis, metoprolol and statin, pain sometimes on jaw or shoulder but brief and resolves on its own, under the care of cardiologist   5. COPD with chronic bronchitis and emphysema ([place])  On Trelegy and doing well at this time   6. Paroxysmal atrial fibrillation ([facility])  Rate controlled at this time  7. Aortic atherosclerosis ([place])  On statin therapy   8. Bloating  Referral to GI  9. Gastroesophageal reflux disease without esophagitis  On PPI  10. Vitamin D deficiency  Take supplementation  11. B12 deficiency  Continue supplementation   12. Gait difficulty  Using a cane  13. Neuropathy   14. Age-related osteoporosis without current pathological fracture   Needs to consider going back to Endo

## 2021-12-12 ENCOUNTER — Ambulatory Visit (INDEPENDENT_AMBULATORY_CARE_PROVIDER_SITE_OTHER): Payer: Medicare Other | Admitting: Family Medicine

## 2021-12-12 ENCOUNTER — Encounter: Payer: Self-pay | Admitting: Family Medicine

## 2021-12-12 VITALS — BP 112/66 | HR 85 | Temp 98.1°F | Resp 16 | Ht 71.0 in | Wt 166.6 lb

## 2021-12-12 DIAGNOSIS — I700 Atherosclerosis of aorta: Secondary | ICD-10-CM

## 2021-12-12 DIAGNOSIS — K760 Fatty (change of) liver, not elsewhere classified: Secondary | ICD-10-CM

## 2021-12-12 DIAGNOSIS — I480 Paroxysmal atrial fibrillation: Secondary | ICD-10-CM

## 2021-12-12 DIAGNOSIS — E559 Vitamin D deficiency, unspecified: Secondary | ICD-10-CM

## 2021-12-12 DIAGNOSIS — D692 Other nonthrombocytopenic purpura: Secondary | ICD-10-CM | POA: Diagnosis not present

## 2021-12-12 DIAGNOSIS — J439 Emphysema, unspecified: Secondary | ICD-10-CM

## 2021-12-12 DIAGNOSIS — R140 Abdominal distension (gaseous): Secondary | ICD-10-CM

## 2021-12-12 DIAGNOSIS — I25119 Atherosclerotic heart disease of native coronary artery with unspecified angina pectoris: Secondary | ICD-10-CM | POA: Diagnosis not present

## 2021-12-12 DIAGNOSIS — M810 Age-related osteoporosis without current pathological fracture: Secondary | ICD-10-CM

## 2021-12-12 DIAGNOSIS — K219 Gastro-esophageal reflux disease without esophagitis: Secondary | ICD-10-CM

## 2021-12-12 DIAGNOSIS — G629 Polyneuropathy, unspecified: Secondary | ICD-10-CM

## 2021-12-12 DIAGNOSIS — R269 Unspecified abnormalities of gait and mobility: Secondary | ICD-10-CM

## 2021-12-12 DIAGNOSIS — J4489 Other specified chronic obstructive pulmonary disease: Secondary | ICD-10-CM

## 2021-12-12 DIAGNOSIS — E538 Deficiency of other specified B group vitamins: Secondary | ICD-10-CM

## 2021-12-12 DIAGNOSIS — R160 Hepatomegaly, not elsewhere classified: Secondary | ICD-10-CM

## 2021-12-18 ENCOUNTER — Ambulatory Visit: Payer: Medicare Other | Admitting: Physician Assistant

## 2021-12-27 NOTE — Progress Notes (Signed)
Cardiology Office Note    Date:  12/31/2021   ID:  Kristie Walters, DOB October 06, 1945, MRN [id number]  PCP:  Steele Sizer, MD  Cardiologist:  Ida Rogue, MD  Electrophysiologist:  None   Chief Complaint: Follow-up  History of Present Illness:   Kristie Walters is a 76 y.o. 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   She was previously followed by Dr. Clayborn Bigness though subsequently transitioned to Dr. Rockey Situ in 09/2017.  Prior echo from 09/2015, done by outside cardiology group, showed an EF greater than 55%, mild LVH, normal RV systolic function, mild MR/TR.  Nuclear stress test at that time showed an EF of 77% with no evidence of significant ischemia or scar.  She was referred to [facility] in 09/2017 for evaluation of incidentally noted aortic atherosclerosis and coronary artery calcium along the LAD on noninvasive imaging.  She was noted to not be very active at baseline secondary to back pain.  She did note some rare palpitations associated with stress that improved with rest.  Aggressive primary prevention was recommended.  She declined stress testing at that time.   She was seen in the ED in 02/2019 with palpitations and noted to be in new onset A. fib with RVR with LBBB (EMS EKG).  She had spontaneously converted to sinus rhythm in the field following IV metoprolol.  She was noted to have a new left bundle which persisted in sinus rhythm.  High-sensitivity troponin negative x2.   TSH mildly elevated at 5.041 with a potassium of 3.5.  She was placed on metoprolol and Eliquis.  Subsequent echo in 03/2019 showed an EF of 55 to 60%, no regional wall motion abnormalities, grade 2 diastolic dysfunction, normal RV systolic function and ventricular cavity size, normal size left atrium, and mildly elevated PASP.     She was seen on 01/05/2020 noting a 2-week history of increased palpitations as well as some associated dizziness, shortness of breath, and chest discomfort.  Lexiscan MPI on 01/11/2020 showed no evidence of significant ischemia or scar with a hyperdynamic LVEF greater than 65%.  With administration of regadenoson she developed a LBBB with known intermittent LBBB.  Attenuation corrected CT images noted aortic atherosclerosis with no significant coronary artery calcification.  Overall, this was a low risk study.  Zio patch showed a predominant rhythm of sinus with an average heart rate of 65 bpm.  First-degree AV block was noted.  18 episodes of SVT were noted with the longest interval lasting 10 beats with an average rate of 109 bpm and the fastest interval lasting 4 beats with a maximum rate of 158 bpm.  Isolated PACs, atrial couplets, atrial triplets, and PVCs were noted.  There were 6 patient triggered events and these were not associated with significant arrhythmia.  Echo in 02/2020 showed an EF of 55-60%, no RWMA, Gr1DD, normal RV systolic function and ventricular cavity size, and a PASP of 35.7 mmHg.   She was seen in the office in 5//2023 and was without symptoms of angina or decompensation.  She did report onset of left-sided palpitations and pinpoint chest discomfort that began after taking metoprolol titrate from a different manufacturer with symptoms typically lasting 1 to 2 minutes and spontaneously resolving.  With this, she self discontinued Lopressor and noted improvement, though not resolution of symptoms.  She was also under increased stress with the health of her son.   We underwent a trial of Toprol-XL 25 mg daily.   She was seen in the office in 07/2021 and was without symptoms of angina or decompensation.  She noted chronic stable exertional dyspnea.  Her main complaint at that time was fatigue that has been present for approximately 1 week.  Upon initiating metoprolol succinate, she noted resolution of palpitations.  Her son and daughter were doing well following stem cell donor/transplant.  She did not qualify for sleep study based on her STOP-BANG score.   She was last seen in the office in 09/2021 and was doing well from a cardiac perspective, without symptoms of angina or decompensation.  Her chronic exertional dyspnea was somewhat improved.  Fatigue was resolved.  Stress level was improved.   She was seen in the ED on 10/24/2021 with A-fib with RVR with rates ranging from 90 to 170 bpm with associated shortness of breath generalized weakness, fatigue, and chills.  EKG showed A-fib with RVR, 106 bpm, and known LBBB.  High-sensitivity troponin of 13 with a delta troponin of 20.  Chest x-ray showed emphysema with no acute cardiopulmonary process.  She spontaneously converted to sinus rhythm in the ED.  She was seen in ED follow-up on 11/01/2021 and was without further chest discomfort or symptoms of decompensation.  She did note some brief paroxysms of palpitations.  She was maintaining sinus rhythm this.  Coronary CTA on 11/15/2021 demonstrated a calcium score of 170 which was the 67th percentile with 25 to 49% proximal LAD stenosis noted.  She comes in and is without symptoms of angina or decompensation.  Over the past several days she has noted elevations in her BP ranging from the 202R to 427C systolic.  One elevated BP reading was in the setting of forgetting to take her medications.  However, her BP has remained elevated there after despite medication adherence.  Historically, she eats an egg with bacon or sausage each morning, though despite improvement in diet  with toast she has not noted a change in her blood pressure readings.  With elevated BP, she does feel warm/flushed and develops a headache.  No palpitations, dizziness, presyncope, or syncope.  No significant lower extremity swelling.  No falls or symptoms concerning for bleeding.     Labs independently reviewed: 10/2021 - Hgb 13.0, PLT 165, potassium 3.8, BUN 17, serum creatinine 0.97 07/2021 - TSH normal 04/2021 - TC 142, TG 194, HDL 34, LDL 79, albumin 4.4, AST normal, ALT 31    Past Medical History:  Diagnosis Date   Allergy    Atrial fibrillation ([place])    Basal cell carcinoma of nose 08/12/2017   Carotid artery disease (HCC)    s/p L. CEA in July 2016   Centrilobular emphysema ([place]) 09/29/2017   Collagen vascular disease ([place])    Left carotid stenosis   COPD (chronic obstructive pulmonary disease) (HCC)    Coronary artery disease 09/29/2017   Noted on chest CT July 2019   Dysrhythmia    Elevated blood pressure (not hypertension)    Fatty liver 09/29/2017   Chest CT July 2019   GERD (gastroesophageal reflux disease)  Hyperlipidemia    Personal history of tobacco use, presenting hazards to health 08/31/2015   PONV (postoperative nausea and vomiting)     Past Surgical History:  Procedure Laterality Date   BREAST BIOPSY     COLONOSCOPY WITH PROPOFOL N/A 08/22/2020   Procedure: COLONOSCOPY WITH PROPOFOL;  Surgeon: Lucilla Lame, MD;  Location: [facility] ENDOSCOPY;  Service: Endoscopy;  Laterality: N/A;   ENDARTERECTOMY Left 08/25/2014   Procedure: ENDARTERECTOMY CAROTID;  Surgeon: Algernon Huxley, MD;  Location: [facility] ORS;  Service: Vascular;  Laterality: Left;   MOHS SURGERY     MOHS SURGERY  09/23/2017   nose   PERIPHERAL VASCULAR CATHETERIZATION N/A 07/20/2014   Procedure: Carotid Angiography;  Surgeon: Algernon Huxley, MD;  Location: [facility] LAB;  Service: Cardiovascular;  Laterality: N/A;   TONSILLECTOMY     TUBAL LIGATION      Current Medications: Current Meds  Medication  Sig   albuterol (VENTOLIN HFA) 108 (90 Base) MCG/ACT inhaler Inhale 2 puffs into the lungs every 6 (six) hours as needed for wheezing or shortness of breath.   amLODipine (NORVASC) 5 MG tablet Take 1 tablet (5 mg total) by mouth daily.   aspirin 81 MG tablet Take 81 mg by mouth daily.   atorvastatin (LIPITOR) 40 MG tablet TAKE 1 TABLET BY MOUTH EVERY DAY   calcium carbonate (OS-CAL - DOSED IN MG OF ELEMENTAL CALCIUM) 1250 (500 Ca) MG tablet Take 1 tablet by mouth.   cholecalciferol (VITAMIN D) 1000 units tablet Take 1,000 Units by mouth daily.   Cyanocobalamin (VITAMIN B-12) 5000 MCG SUBL Place under the tongue.   ELIQUIS 5 MG TABS tablet TAKE 1 TABLET BY MOUTH TWICE A DAY   ezetimibe (ZETIA) 10 MG tablet TAKE 1 TABLET BY MOUTH EVERY DAY   Fish Oil-Cholecalciferol (FISH OIL + D3 PO) Take 1,000 mg by mouth 1 day or 1 dose.   fluticasone (FLONASE) 50 MCG/ACT nasal spray Place 2 sprays into both nostrils daily.   metoprolol succinate (TOPROL XL) 25 MG 24 hr tablet Take 1 tablet (25 mg total) by mouth daily.   metoprolol tartrate (LOPRESSOR) 25 MG tablet Take 1 tablet (25 mg total) by mouth 2 (two) times daily as needed (As needed for palpitations).   omeprazole (PRILOSEC) 40 MG capsule TAKE 1 CAPSULE (40 MG TOTAL) BY MOUTH AS NEEDED   pregabalin (LYRICA) 50 MG capsule Take 1 capsule (50 mg total) by mouth 3 (three) times daily.   TRELEGY ELLIPTA 100-62.5-25 MCG/ACT AEPB TAKE 1 PUFF BY MOUTH EVERY DAY    Allergies:   Morphine, Morphine and related, and Zoster vaccine live   Social History   Socioeconomic History   Marital status: Divorced    Spouse name: Not on file   Number of children: 2   Years of education: Not on file   Highest education level: 10th grade  Occupational History   Occupation: Retired  Tobacco Use   Smoking status: Former    Packs/day: 1.00    Years: 55.00    Total pack years: 55.00    Types: Cigarettes    Quit date: 11/26/2016    Years since quitting: 5.0     Passive exposure: Past   Smokeless tobacco: Former    Types: Snuff   Tobacco comments:    smoking cessation materials not required  Vaping Use   Vaping Use: Never used  Substance and Sexual Activity   Alcohol use: No   Drug use: No   Sexual activity: Not  Currently  Other Topics Concern   Not on file  Social History Narrative    Pt lives alone   Social Determinants of Health   Financial Resource Strain: Medium Risk (08/09/2021)   Overall Financial Resource Strain (CARDIA)    Difficulty of Paying Living Expenses: Somewhat hard  Food Insecurity: No Food Insecurity (08/09/2021)   Hunger Vital Sign    Worried About Running Out of Food in the Last Year: Never true    Ran Out of Food in the Last Year: Never true  Transportation Needs: No Transportation Needs (08/09/2021)   PRAPARE - Hydrologist (Medical): No    Lack of Transportation (Non-Medical): No  Physical Activity: Insufficiently Active (08/09/2021)   Exercise Vital Sign    Days of Exercise per Week: 1 day    Minutes of Exercise per Session: 10 min  Stress: No Stress Concern Present (08/09/2021)   [place]    Feeling of Stress : Only a little  Social Connections: Moderately Isolated (08/08/2020)   Social Connection and Isolation Panel [NHANES]    Frequency of Communication with Friends and Family: More than three times a week    Frequency of Social Gatherings with Friends and Family: Three times a week    Attends Religious Services: More than 4 times per year    Active Member of Clubs or Organizations: No    Attends Archivist Meetings: Never    Marital Status: Divorced     Family History:  The patient's family history includes Alzheimer's disease in her brother; CVA in her father; Dementia in her mother; Diabetes in her brother and brother; HIV in her son; Heart attack in her mother; Hypercholesterolemia in her mother;  Hypertension in her mother; Hypothyroidism in her mother; Kidney cancer in her sister; Liver cancer in her father; Lymphoma in her son; Other in her brother; Peripheral vascular disease in her mother. There is no history of Breast cancer.  ROS:   12-point review of systems is negative unless otherwise noted in the HPI.   EKGs/Labs/Other Studies Reviewed:    Studies reviewed were summarized above. The additional studies were reviewed today:  Coronary CTA 11/15/2021: FINDINGS: Aorta: Normal size. Aortic root and descending aorta calcifications. No dissection.   Aortic Valve:  Trileaflet.  No calcifications.   Coronary Arteries:  Normal coronary origin.  Right dominance.   RCA is a dominant artery that gives rise to PDA and PLA. There is no plaque.   Left main gives rise to LAD and LCX arteries. There is no LM disease.   LAD has calcified plaque in the proximal segment causing mild stenosis (25-49%).   LCX is a non-dominant artery that gives rise to three obtuse marginal branches. There is no plaque.   Other findings:   Normal pulmonary vein drainage into the left atrium.   Normal left atrial appendage without a thrombus.   Normal size of the pulmonary artery.   IMPRESSION: 1. Coronary calcium score of 170. This was 67th percentile for age and sex matched control. 2. Normal coronary origin with right dominance. 3. Calcified plaque in the proximal LAD causing mild stenosis (25-49%) 4. CAD-RADS 2. Mild non-obstructive CAD (25-49%). Consider non-atherosclerotic causes of chest pain. Consider preventive therapy and risk factor modification. __________  2D echo 09/04/2021: 1. Left ventricular ejection fraction, by estimation, is 55 to 60%. The  left ventricle has normal function. The left ventricle has no regional  wall motion abnormalities. Left ventricular diastolic parameters are  consistent with Grade I diastolic  dysfunction (impaired relaxation).   2. Right  ventricular systolic function is normal. The right ventricular  size is normal. Tricuspid regurgitation signal is inadequate for assessing  PA pressure.   3. The mitral valve is normal in structure. No evidence of mitral valve  regurgitation. No evidence of mitral stenosis.   4. The aortic valve was not well visualized. Aortic valve regurgitation  is not visualized. No aortic stenosis is present.   5. The inferior vena cava is normal in size with greater than 50%  respiratory variability, suggesting right atrial pressure of 3 mmHg.   Comparison(s): 02/2020-EF 55-60%. __________   Carotid artery ultrasound 07/20/2021: Summary:  Right Carotid: Velocities in the right ICA are consistent with a 1-39% stenosis.   Left Carotid: Velocities in the left ICA are consistent with a 1-39%  stenosis.   Vertebrals:  Bilateral vertebral arteries demonstrate antegrade flow.  Subclavians: Normal flow hemodynamics were seen in bilateral subclavian arteries. ___________   2D echo 03/09/2020: 1. Left ventricular ejection fraction, by estimation, is 55 to 60%. The  left ventricle has normal function. The left ventricle has no regional  wall motion abnormalities. Left ventricular diastolic parameters are  consistent with Grade I diastolic  dysfunction (impaired relaxation).   2. Right ventricular systolic function is normal. The right ventricular  size is normal. There is normal pulmonary artery systolic pressure. The  estimated right ventricular systolic pressure is 60.1 mmHg.   3. The aortic valve was not well visualized. __________   Carlton Adam MPI 01/11/2020: Normal pharmacologic myocardial perfusion stress test without evidence of significant ischemia or scar. The left ventricular ejection fraction is hyperdynamic (>65%). Left bundle branch block developed after administration of regadenoson. Patient known to have intermittent LBBB. Attenuation correction CT is notable for aortic atherosclerosis.  There is no significant coronary artery calcification. This is a low risk study. __________   Elwyn Reach patch 12/2019: Normal sinus rhythm avg HR of 65 bpm.    Patient triggered events (6) were not associated with significant arrhythmia.   First Degree AV Block was present.  18 Supraventricular Tachycardia runs occurred, the run with the fastest interval lasting 4 beats with a max rate of 158 bpm, the longest lasting 10 beats with an avg rate of 109 bpm.   Isolated SVEs were rare (<1.0%), SVE Couplets were rare (<1.0%), and SVE Triplets were rare (<1.0%). Isolated VEs were rare (<1.0%), and no VE Couplets or VE Triplets were present. __________   2D echo 03/2019: 1. Left ventricular ejection fraction, by visual estimation, is 55 to  60%. The left ventricle has normal function. There is no left ventricular  hypertrophy.   2. Left ventricular diastolic parameters are consistent with Grade II  diastolic dysfunction (pseudonormalization).   3. The left ventricle has no regional wall motion abnormalities.   4. Global right ventricle has normal systolic function.The right  ventricular size is normal. No increase in right ventricular wall  thickness.   5. Left atrial size was normal.   6. Mildly elevated pulmonary artery systolic pressure.   EKG:  EKG is ordered today.  The EKG ordered today demonstrates NSR, 73 bpm, first-degree AV block, LBBB (known)  Recent Labs: 04/18/2021: ALT 31 08/03/2021: TSH 3.494 10/24/2021: BUN 17; Creatinine, Ser 0.97; Hemoglobin 13.0; Platelets 165; Potassium 3.8; Sodium 140  Recent Lipid Panel    Component Value Date/Time   CHOL 142 04/18/2021 1423   CHOL  155 05/08/2015 0927   TRIG 194 (H) 04/18/2021 1423   HDL 34 (L) 04/18/2021 1423   HDL 30 (L) 05/08/2015 0927   CHOLHDL 4.2 04/18/2021 1423   LDLCALC 79 04/18/2021 1423    PHYSICAL EXAM:    VS:  BP (!) 142/80   Pulse 73   Ht '5\' 11"'$  (1.803 m)   Wt 166 lb 3.2 oz (75.4 kg)   SpO2 96%   BMI 23.18 kg/m    BMI: Body mass index is 23.18 kg/m.  Physical Exam Vitals reviewed.  Constitutional:      Appearance: She is well-developed.  HENT:     Head: Normocephalic and atraumatic.  Eyes:     General:        Right eye: No discharge.        Left eye: No discharge.  Neck:     Vascular: No JVD.  Cardiovascular:     Rate and Rhythm: Normal rate and regular rhythm.     Pulses:          Posterior tibial pulses are 2+ on the right side and 2+ on the left side.     Heart sounds: Normal heart sounds, S1 normal and S2 normal. Heart sounds not distant. No midsystolic click and no opening snap. No murmur heard.    No friction rub.  Pulmonary:     Effort: Pulmonary effort is normal. No respiratory distress.     Breath sounds: Normal breath sounds. No decreased breath sounds, wheezing or rales.  Chest:     Chest wall: No tenderness.  Abdominal:     General: There is no distension.  Musculoskeletal:     Cervical back: Normal range of motion.     Right lower leg: No edema.     Left lower leg: No edema.  Skin:    General: Skin is warm and dry.     Nails: There is no clubbing.  Neurological:     Mental Status: She is alert and oriented to person, place, and time.  Psychiatric:        Speech: Speech normal.        Behavior: Behavior normal.        Thought Content: Thought content normal.        Judgment: Judgment normal.     Wt Readings from Last 3 Encounters:  12/31/21 166 lb 3.2 oz (75.4 kg)  12/12/21 166 lb 9.6 oz (75.6 kg)  11/01/21 166 lb 9.6 oz (75.6 kg)     ASSESSMENT & PLAN:   CAD involving the native coronary arteries without angina: She is doing well and without symptoms concerning for angina.  Recent coronary CTA showed nonobstructive CAD as outlined above.  Continue aggressive risk factor modification and current medical therapy including aspirin, atorvastatin, ezetimibe, and Toprol-XL.  No indication for further ischemic testing at this time.  PAF/PSVT: Maintaining sinus  rhythm.  Continue Toprol-XL and as needed Lopressor.  CHA2DS2-VASc at least 3.  She remains on apixaban 5 mg twice daily and does not meet reduced dosing criteria.  Recent renal function, electrolytes, and Hgb stable.  Diastolic dysfunction: She appears euvolemic and well compensated.  Continue optimal blood pressure control.  Not requiring a standing loop diuretic.  LBBB: Stable.  No symptoms of syncope.  Prior echo demonstrated preserved LV systolic function with Lexiscan MPI in 2021 showing no evidence of ischemia and coronary CTA in 2023 showing nonobstructive disease.  Continue to monitor.  HTN: Blood pressure is mildly elevated in the  office today and has been running on the higher side at home.  Add amlodipine 5 mg daily with continuation of Toprol-XL 25 mg daily.  Low-sodium diet is encouraged.  HLD: LDL 79.  She remains on atorvastatin and ezetimibe.  Carotid artery disease: Status post left-sided CEA in 08/2014.  Carotid artery ultrasound from 07/2021 demonstrated bilateral 1 to 39% stenoses.  She remains on aspirin, atorvastatin, and ezetimibe.  Follow-up with vascular surgery as directed.  COPD: Stable.  Follow-up with pulmonology as directed.   Disposition: F/u with Dr. Rockey Situ or an APP in 2 months.   Medication Adjustments/Labs and Tests Ordered: Current medicines are reviewed at length with the patient today.  Concerns regarding medicines are outlined above. Medication changes, Labs and Tests ordered today are summarized above and listed in the Patient Instructions accessible in Encounters.   Signed, Christell Faith, PA-C 12/31/2021 3:51 PM     [facility] [address] [place], [place] [address] [phone]

## 2021-12-31 ENCOUNTER — Ambulatory Visit: Payer: Medicare Other | Attending: Physician Assistant | Admitting: Physician Assistant

## 2021-12-31 ENCOUNTER — Encounter: Payer: Self-pay | Admitting: Physician Assistant

## 2021-12-31 VITALS — BP 142/80 | HR 73 | Ht 71.0 in | Wt 166.2 lb

## 2021-12-31 DIAGNOSIS — I5189 Other ill-defined heart diseases: Secondary | ICD-10-CM | POA: Diagnosis not present

## 2021-12-31 DIAGNOSIS — I10 Essential (primary) hypertension: Secondary | ICD-10-CM

## 2021-12-31 DIAGNOSIS — I447 Left bundle-branch block, unspecified: Secondary | ICD-10-CM

## 2021-12-31 DIAGNOSIS — I2510 Atherosclerotic heart disease of native coronary artery without angina pectoris: Secondary | ICD-10-CM

## 2021-12-31 DIAGNOSIS — I480 Paroxysmal atrial fibrillation: Secondary | ICD-10-CM

## 2021-12-31 DIAGNOSIS — I6523 Occlusion and stenosis of bilateral carotid arteries: Secondary | ICD-10-CM

## 2021-12-31 DIAGNOSIS — E785 Hyperlipidemia, unspecified: Secondary | ICD-10-CM

## 2021-12-31 DIAGNOSIS — I471 Supraventricular tachycardia, unspecified: Secondary | ICD-10-CM

## 2021-12-31 MED ORDER — AMLODIPINE BESYLATE 5 MG PO TABS: 5.0000 mg | ORAL_TABLET | Freq: Every day | ORAL | 3 refills | Status: DC

## 2021-12-31 NOTE — Patient Instructions (Signed)
Medication Instructions:  START Amlodipine 5 mg once daily  *If you need a refill on your cardiac medications before your next appointment, please call your pharmacy*   Lab Work: None ordered If you have labs (blood work) drawn today and your tests are completely normal, you will receive your results only by: [place] (if you have MyChart) OR A paper copy in the mail If you have any lab test that is abnormal or we need to change your treatment, we will call you to review the results.   Testing/Procedures: None ordered   Follow-Up: At [facility], you and your health needs are our priority.  As part of our continuing mission to provide you with exceptional heart care, we have created designated Provider Care Teams.  These Care Teams include your primary Cardiologist (physician) and Advanced Practice Providers (APPs -  Physician Assistants and Nurse Practitioners) who all work together to provide you with the care you need, when you need it.  We recommend signing up for the patient portal called "MyChart".  Sign up information is provided on this After Visit Summary.  MyChart is used to connect with patients for Virtual Visits (Telemedicine).  Patients are able to view lab/test results, encounter notes, upcoming appointments, etc.  Non-urgent messages can be sent to your provider as well.   To learn more about what you can do with MyChart, go to [email or web address].    Your next appointment:   2 month(s)  The format for your next appointment:   In Person  Provider:   You may see Ida Rogue, MD or one of the following Advanced Practice Providers on your designated Care Team:   Murray Hodgkins, NP Christell Faith, PA-C Cadence Kathlen Mody, PA-C Gerrie Nordmann, NP    Important Information About Sugar

## 2022-01-31 ENCOUNTER — Ambulatory Visit: Payer: Medicare Other | Admitting: Dermatology

## 2022-02-14 ENCOUNTER — Ambulatory Visit
Admission: RE | Admit: 2022-02-14 | Discharge: 2022-02-14 | Disposition: A | Discharge status: Home / Self Care | Payer: Medicare Other | Source: Ambulatory Visit | Attending: Acute Care | Admitting: Acute Care

## 2022-02-14 DIAGNOSIS — Z87891 Personal history of nicotine dependence: Secondary | ICD-10-CM

## 2022-02-27 ENCOUNTER — Ambulatory Visit
Admission: RE | Admit: 2022-02-27 | Discharge: 2022-02-27 | Disposition: A | Discharge status: Home / Self Care | Payer: 59 | Source: Ambulatory Visit | Attending: Physician Assistant | Admitting: Physician Assistant

## 2022-02-27 DIAGNOSIS — M810 Age-related osteoporosis without current pathological fracture: Secondary | ICD-10-CM | POA: Insufficient documentation

## 2022-02-28 ENCOUNTER — Telehealth: Payer: Self-pay | Admitting: Acute Care

## 2022-02-28 DIAGNOSIS — Z87891 Personal history of nicotine dependence: Secondary | ICD-10-CM

## 2022-02-28 DIAGNOSIS — R911 Solitary pulmonary nodule: Secondary | ICD-10-CM

## 2022-02-28 NOTE — Telephone Encounter (Signed)
The 10.2 non-solid area that was new in 01/2021 has grown to 10.9 mm in 12 months. I would like to do a 6 month follow up on this . Please call patient and let her know there is an area that has had some growth over the last year. Let her know we will do a 6 month LDCT follow up to make sure it does not continue to grow.  Please fax results to PCP and let them know plan for for follow up.  Thanks so much

## 2022-03-01 NOTE — Telephone Encounter (Signed)
Spoke with pt and advised to CT results per Eric Form, NP. PT verbalized understanding and is aware we will call her closer to 6 mth to schedule f/u CT. Results faxed to PCP and new CT order placed.

## 2022-03-03 NOTE — Progress Notes (Unsigned)
Cardiology Office Note    Date:  03/04/2022   ID:  Kristie Walters, DOB 08-30-1945, MRN [id number]  PCP:  Steele Sizer, MD  Cardiologist:  Ida Rogue, MD  Electrophysiologist:  None   Chief Complaint: Follow up  History of Present Illness:   Kristie Walters is a 77 y.o. 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   She was previously followed by Dr. Clayborn Bigness though subsequently transitioned to Dr. Rockey Situ in 09/2017.  Prior echo from 09/2015, done by outside cardiology group, showed an EF greater than 55%, mild LVH, normal RV systolic function, mild MR/TR.  Nuclear stress test at that time showed an EF of 77% with no evidence of significant ischemia or scar.  She was referred to [facility] in 09/2017 for evaluation of incidentally noted aortic atherosclerosis and coronary artery calcium along the LAD on noninvasive imaging.  She was noted to not be very active at baseline secondary to back pain.  She did note some rare palpitations associated with stress that improved with rest.  Aggressive primary prevention was recommended.  She declined stress testing at that time.   She was seen in the ED in 02/2019 with palpitations and noted to be in new onset A. fib with RVR with LBBB (EMS EKG).  She had spontaneously converted to sinus rhythm in the field following IV metoprolol.  She was noted to have a new left bundle which persisted in sinus rhythm.  High-sensitivity troponin negative x2.  TSH mildly  elevated at 5.041 with a potassium of 3.5.  She was placed on metoprolol and Eliquis.  Subsequent echo in 03/2019 showed an EF of 55 to 60%, no regional wall motion abnormalities, grade 2 diastolic dysfunction, normal RV systolic function and ventricular cavity size, normal size left atrium, and mildly elevated PASP.     She was seen on 01/05/2020 noting a 2-week history of increased palpitations as well as some associated dizziness, shortness of breath, and chest discomfort.  Lexiscan MPI on 01/11/2020 showed no evidence of significant ischemia or scar with a hyperdynamic LVEF greater than 65%.  With administration of regadenoson she developed a LBBB with known intermittent LBBB.  Attenuation corrected CT images noted aortic atherosclerosis with no significant coronary artery calcification.  Overall, this was a low risk study.  Zio patch showed a predominant rhythm of sinus with an average heart rate of 65 bpm.  First-degree AV block was noted.  18 episodes of SVT were noted with the longest interval lasting 10 beats with an average rate of 109 bpm and the fastest interval lasting 4 beats with a maximum rate of 158 bpm.  Isolated PACs, atrial couplets, atrial triplets, and PVCs were noted.  There were 6 patient triggered events and these were not associated with significant arrhythmia.  Echo in 02/2020 showed an EF of 55-60%, no RWMA, Gr1DD, normal RV systolic function and ventricular cavity size, and a PASP of 35.7 mmHg.   She was seen in the office in 5//2023 and was without symptoms of angina or decompensation.  She did report onset of left-sided palpitations and pinpoint chest discomfort that began after taking metoprolol titrate from a different manufacturer with symptoms typically lasting 1 to 2 minutes and spontaneously resolving.  With this, she self discontinued Lopressor and noted improvement, though not resolution of symptoms.  She was also under increased stress with the health of her son.  We  underwent a trial of Toprol-XL 25 mg daily.   She was seen in the office in 07/2021 and was without symptoms of angina or decompensation.  She noted chronic stable exertional dyspnea.  Her main complaint at that time was fatigue that has been present for approximately 1 week.  Upon initiating metoprolol succinate, she noted resolution of palpitations.  Her son and daughter were doing well following stem cell donor/transplant.  She did not qualify for sleep study based on her STOP-BANG score.   She was seen in the office in 09/2021 and was doing well from a cardiac perspective, without symptoms of angina or decompensation.  Her chronic exertional dyspnea was somewhat improved.  Fatigue was resolved.  Stress level was improved.   She was seen in the ED on 10/24/2021 with A-fib with RVR with rates ranging from 90 to 170 bpm with associated shortness of breath generalized weakness, fatigue, and chills.  EKG showed A-fib with RVR, 106 bpm, and known LBBB.  High-sensitivity troponin of 13 with a delta troponin of 20.  Chest x-ray showed emphysema with no acute cardiopulmonary process.  She spontaneously converted to sinus rhythm in the ED.   She was seen in ED follow-up on 11/01/2021 and was without further chest discomfort or symptoms of decompensation.  She did note some brief paroxysms of palpitations.  She was maintaining sinus rhythm this.  Coronary CTA on 11/15/2021 demonstrated a calcium score of 170 which was the 67th percentile with 25 to 49% proximal LAD stenosis noted.  She was last seen in the office on 12/31/2021, and was without symptoms of angina or decompensation.  She did note some elevations in her home BP readings ranging from the 841L to 244W systolic, with a diet high in sodium intake. Amlodipine 5 mg was added.   She comes in doing well from a cardiac perspective and is without symptoms of angina or decompensation.  No dyspnea, palpitations, dizziness, presyncope, or syncope.  She reports a  history of labile hypertension and at times has not needed her amlodipine.  BP will range from the low 102V to 253G systolic.  She is having to take amlodipine at different times on a daily basis.  No significant lower extremity swelling or progressive orthopnea.  No falls or symptoms concerning for bleeding.   Labs independently reviewed: 10/2021 - Hgb 13.0, PLT 165, potassium 3.8, BUN 17, serum creatinine 0.97 07/2021 - TSH normal 04/2021 - TC 142, TG 194, HDL 34, LDL 79, albumin 4.4, AST normal, ALT 31  Past Medical History:  Diagnosis Date   Allergy    Atrial fibrillation ([place])    Basal cell carcinoma of nose 08/12/2017   Carotid artery disease (HCC)    s/p L. CEA in July 2016   Centrilobular emphysema ([place]) 09/29/2017   Collagen vascular disease ([place])    Left carotid stenosis   COPD (chronic obstructive pulmonary disease) ([place])    Coronary artery disease 09/29/2017   Noted on chest CT July 2019   Dysrhythmia    Elevated blood pressure (not hypertension)    Fatty liver 09/29/2017   Chest CT July 2019  GERD (gastroesophageal reflux disease)    Hyperlipidemia    Personal history of tobacco use, presenting hazards to health 08/31/2015   PONV (postoperative nausea and vomiting)     Past Surgical History:  Procedure Laterality Date   BREAST BIOPSY     COLONOSCOPY WITH PROPOFOL N/A 08/22/2020   Procedure: COLONOSCOPY WITH PROPOFOL;  Surgeon: Lucilla Lame, MD;  Location: [facility] ENDOSCOPY;  Service: Endoscopy;  Laterality: N/A;   ENDARTERECTOMY Left 08/25/2014   Procedure: ENDARTERECTOMY CAROTID;  Surgeon: Algernon Huxley, MD;  Location: [facility] ORS;  Service: Vascular;  Laterality: Left;   MOHS SURGERY     MOHS SURGERY  09/23/2017   nose   PERIPHERAL VASCULAR CATHETERIZATION N/A 07/20/2014   Procedure: Carotid Angiography;  Surgeon: Algernon Huxley, MD;  Location: [place] CV LAB;  Service: Cardiovascular;  Laterality: N/A;   TONSILLECTOMY     TUBAL LIGATION      Current  Medications: Current Meds  Medication Sig   albuterol (VENTOLIN HFA) 108 (90 Base) MCG/ACT inhaler Inhale 2 puffs into the lungs every 6 (six) hours as needed for wheezing or shortness of breath.   aspirin 81 MG tablet Take 81 mg by mouth daily.   atorvastatin (LIPITOR) 40 MG tablet TAKE 1 TABLET BY MOUTH EVERY DAY   calcium carbonate (OS-CAL - DOSED IN MG OF ELEMENTAL CALCIUM) 1250 (500 Ca) MG tablet Take 1 tablet by mouth.   cholecalciferol (VITAMIN D) 1000 units tablet Take 1,000 Units by mouth daily.   Cyanocobalamin (VITAMIN B-12) 5000 MCG SUBL Place under the tongue.   ELIQUIS 5 MG TABS tablet TAKE 1 TABLET BY MOUTH TWICE A DAY   ezetimibe (ZETIA) 10 MG tablet TAKE 1 TABLET BY MOUTH EVERY DAY   Fish Oil-Cholecalciferol (FISH OIL + D3 PO) Take 1,000 mg by mouth 1 day or 1 dose.   metoprolol succinate (TOPROL XL) 25 MG 24 hr tablet Take 1 tablet (25 mg total) by mouth daily.   metoprolol tartrate (LOPRESSOR) 25 MG tablet Take 1 tablet (25 mg total) by mouth 2 (two) times daily as needed (As needed for palpitations).   omeprazole (PRILOSEC) 40 MG capsule TAKE 1 CAPSULE (40 MG TOTAL) BY MOUTH AS NEEDED   pregabalin (LYRICA) 50 MG capsule Take 1 capsule (50 mg total) by mouth 3 (three) times daily.   TRELEGY ELLIPTA 100-62.5-25 MCG/ACT AEPB TAKE 1 PUFF BY MOUTH EVERY DAY   [DISCONTINUED] amLODipine (NORVASC) 5 MG tablet Take 1 tablet (5 mg total) by mouth daily.    Allergies:   Morphine, Morphine and related, and Zoster vaccine live   Social History   Socioeconomic History   Marital status: Divorced    Spouse name: Not on file   Number of children: 2   Years of education: Not on file   Highest education level: 10th grade  Occupational History   Occupation: Retired  Tobacco Use   Smoking status: Former    Packs/day: 1.00    Years: 55.00    Total pack years: 55.00    Types: Cigarettes    Quit date: 11/26/2016    Years since quitting: 5.2    Passive exposure: Past   Smokeless  tobacco: Former    Types: Snuff   Tobacco comments:    smoking cessation materials not required  Vaping Use   Vaping Use: Never used  Substance and Sexual Activity   Alcohol use: No   Drug use: No   Sexual activity: Not Currently  Other Topics Concern  Not on file  Social History Narrative    Pt lives alone   Social Determinants of Health   Financial Resource Strain: Medium Risk (08/09/2021)   Overall Financial Resource Strain (CARDIA)    Difficulty of Paying Living Expenses: Somewhat hard  Food Insecurity: No Food Insecurity (08/09/2021)   Hunger Vital Sign    Worried About Running Out of Food in the Last Year: Never true    Ran Out of Food in the Last Year: Never true  Transportation Needs: No Transportation Needs (08/09/2021)   PRAPARE - Hydrologist (Medical): No    Lack of Transportation (Non-Medical): No  Physical Activity: Insufficiently Active (08/09/2021)   Exercise Vital Sign    Days of Exercise per Week: 1 day    Minutes of Exercise per Session: 10 min  Stress: No Stress Concern Present (08/09/2021)   [place]    Feeling of Stress : Only a little  Social Connections: Moderately Isolated (08/08/2020)   Social Connection and Isolation Panel [NHANES]    Frequency of Communication with Friends and Family: More than three times a week    Frequency of Social Gatherings with Friends and Family: Three times a week    Attends Religious Services: More than 4 times per year    Active Member of Clubs or Organizations: No    Attends Archivist Meetings: Never    Marital Status: Divorced     Family History:  The patient's family history includes Alzheimer's disease in her brother; CVA in her father; Dementia in her mother; Diabetes in her brother and brother; HIV in her son; Heart attack in her mother; Hypercholesterolemia in her mother; Hypertension in her mother;  Hypothyroidism in her mother; Kidney cancer in her sister; Liver cancer in her father; Lymphoma in her son; Other in her brother; Peripheral vascular disease in her mother. There is no history of Breast cancer.  ROS:   12-point review of systems is negative unless otherwise noted in the HPI   EKGs/Labs/Other Studies Reviewed:    Studies reviewed were summarized above. The additional studies were reviewed today:  Coronary CTA 11/15/2021: FINDINGS: Aorta: Normal size. Aortic root and descending aorta calcifications. No dissection.   Aortic Valve:  Trileaflet.  No calcifications.   Coronary Arteries:  Normal coronary origin.  Right dominance.   RCA is a dominant artery that gives rise to PDA and PLA. There is no plaque.   Left main gives rise to LAD and LCX arteries. There is no LM disease.   LAD has calcified plaque in the proximal segment causing mild stenosis (25-49%).   LCX is a non-dominant artery that gives rise to three obtuse marginal branches. There is no plaque.   Other findings:   Normal pulmonary vein drainage into the left atrium.   Normal left atrial appendage without a thrombus.   Normal size of the pulmonary artery.   IMPRESSION: 1. Coronary calcium score of 170. This was 67th percentile for age and sex matched control. 2. Normal coronary origin with right dominance. 3. Calcified plaque in the proximal LAD causing mild stenosis (25-49%) 4. CAD-RADS 2. Mild non-obstructive CAD (25-49%). Consider non-atherosclerotic causes of chest pain. Consider preventive therapy and risk factor modification. __________   2D echo 09/04/2021: 1. Left ventricular ejection fraction, by estimation, is 55 to 60%. The  left ventricle has normal function. The left ventricle has no regional  wall motion abnormalities. Left ventricular diastolic  parameters are  consistent with Grade I diastolic  dysfunction (impaired relaxation).   2. Right ventricular systolic function is  normal. The right ventricular  size is normal. Tricuspid regurgitation signal is inadequate for assessing  PA pressure.   3. The mitral valve is normal in structure. No evidence of mitral valve  regurgitation. No evidence of mitral stenosis.   4. The aortic valve was not well visualized. Aortic valve regurgitation  is not visualized. No aortic stenosis is present.   5. The inferior vena cava is normal in size with greater than 50%  respiratory variability, suggesting right atrial pressure of 3 mmHg.   Comparison(s): 02/2020-EF 55-60%. __________   Carotid artery ultrasound 07/20/2021: Summary:  Right Carotid: Velocities in the right ICA are consistent with a 1-39% stenosis.   Left Carotid: Velocities in the left ICA are consistent with a 1-39%  stenosis.   Vertebrals:  Bilateral vertebral arteries demonstrate antegrade flow.  Subclavians: Normal flow hemodynamics were seen in bilateral subclavian arteries. ___________   2D echo 03/09/2020: 1. Left ventricular ejection fraction, by estimation, is 55 to 60%. The  left ventricle has normal function. The left ventricle has no regional  wall motion abnormalities. Left ventricular diastolic parameters are  consistent with Grade I diastolic  dysfunction (impaired relaxation).   2. Right ventricular systolic function is normal. The right ventricular  size is normal. There is normal pulmonary artery systolic pressure. The  estimated right ventricular systolic pressure is 80.9 mmHg.   3. The aortic valve was not well visualized. __________   Carlton Adam MPI 01/11/2020: Normal pharmacologic myocardial perfusion stress test without evidence of significant ischemia or scar. The left ventricular ejection fraction is hyperdynamic (>65%). Left bundle branch block developed after administration of regadenoson. Patient known to have intermittent LBBB. Attenuation correction CT is notable for aortic atherosclerosis. There is no significant coronary  artery calcification. This is a low risk study. __________   Elwyn Reach patch 12/2019: Normal sinus rhythm avg HR of 65 bpm.    Patient triggered events (6) were not associated with significant arrhythmia.   First Degree AV Block was present.  18 Supraventricular Tachycardia runs occurred, the run with the fastest interval lasting 4 beats with a max rate of 158 bpm, the longest lasting 10 beats with an avg rate of 109 bpm.   Isolated SVEs were rare (<1.0%), SVE Couplets were rare (<1.0%), and SVE Triplets were rare (<1.0%). Isolated VEs were rare (<1.0%), and no VE Couplets or VE Triplets were present. __________   2D echo 03/2019: 1. Left ventricular ejection fraction, by visual estimation, is 55 to  60%. The left ventricle has normal function. There is no left ventricular  hypertrophy.   2. Left ventricular diastolic parameters are consistent with Grade II  diastolic dysfunction (pseudonormalization).   3. The left ventricle has no regional wall motion abnormalities.   4. Global right ventricle has normal systolic function.The right  ventricular size is normal. No increase in right ventricular wall  thickness.   5. Left atrial size was normal.   6. Mildly elevated pulmonary artery systolic pressure.   EKG:  EKG is ordered today.  The EKG ordered today demonstrates NSR, 78 bpm, LBBB (known)  Recent Labs: 04/18/2021: ALT 31 08/03/2021: TSH 3.494 10/24/2021: BUN 17; Creatinine, Ser 0.97; Hemoglobin 13.0; Platelets 165; Potassium 3.8; Sodium 140  Recent Lipid Panel    Component Value Date/Time   CHOL 142 04/18/2021 1423   CHOL 155 05/08/2015 0927   TRIG 194 (H) 04/18/2021  1423   HDL 34 (L) 04/18/2021 1423   HDL 30 (L) 05/08/2015 0927   CHOLHDL 4.2 04/18/2021 1423   LDLCALC 79 04/18/2021 1423    PHYSICAL EXAM:    VS:  BP 134/74 (BP Location: Left Arm, Patient Position: Sitting, Cuff Size: Normal)   Pulse 78   Ht '5\' 8"'$  (1.727 m)   Wt 169 lb (76.7 kg)   SpO2 98%   BMI 25.70  kg/m   BMI: Body mass index is 25.7 kg/m.  Physical Exam Vitals reviewed.  Constitutional:      Appearance: She is well-developed.  HENT:     Head: Normocephalic and atraumatic.  Eyes:     General:        Right eye: No discharge.        Left eye: No discharge.  Neck:     Vascular: No JVD.  Cardiovascular:     Rate and Rhythm: Normal rate and regular rhythm.     Pulses:          Posterior tibial pulses are 2+ on the right side and 2+ on the left side.     Heart sounds: Normal heart sounds, S1 normal and S2 normal. Heart sounds not distant. No midsystolic click and no opening snap. No murmur heard.    No friction rub.  Pulmonary:     Effort: Pulmonary effort is normal. No respiratory distress.     Breath sounds: Normal breath sounds. No decreased breath sounds, wheezing or rales.  Chest:     Chest wall: No tenderness.  Abdominal:     General: There is no distension.  Musculoskeletal:     Cervical back: Normal range of motion.     Right lower leg: No edema.     Left lower leg: No edema.  Skin:    General: Skin is warm and dry.     Nails: There is no clubbing.  Neurological:     Mental Status: She is alert and oriented to person, place, and time.  Psychiatric:        Speech: Speech normal.        Behavior: Behavior normal.        Thought Content: Thought content normal.        Judgment: Judgment normal.     Wt Readings from Last 3 Encounters:  03/04/22 169 lb (76.7 kg)  12/31/21 166 lb 3.2 oz (75.4 kg)  12/12/21 166 lb 9.6 oz (75.6 kg)     ASSESSMENT & PLAN:   CAD involving the native coronary arteries without angina: She is doing well and is without symptoms concerning for angina or decompensation.  Recent coronary CTA showed nonobstructive CAD as outlined above.  Continue aggressive risk factor modification and current medical therapy including aspirin, atorvastatin, ezetimibe, and Toprol-XL.  No indication for further ischemic testing at this time.  PAF/PSVT:  Maintaining sinus rhythm with Toprol-XL.  She has not needed any as needed Lopressor.  CHA2DS2-VASc at least 5 (HTN, age x 2, vascular disease, sex category).  She remains on apixaban 5 mg twice daily and does not meet reduced dosing criteria.  Recent labs stable.  No symptoms concerning for bleeding.  Diastolic dysfunction: She appears euvolemic and well compensated.  Continue optimal blood pressure control.  Not requiring a standing loop diuretic.  LBBB: Stable.  No symptoms of syncope.  Prior echo demonstrated preserved LV systolic function with Lexiscan MPI in 2021 showing no evidence of ischemia and coronary CTA in 2023 showing nonobstructive CAD.  Continue to monitor.  HTN: Blood pressure is reasonably controlled in the office today.  She reports a history of labile hypertension.  We will transition her amlodipine to as needed for systolic blood pressure greater than 140 mmHg with continuation of Toprol-XL.  Low-sodium diet is encouraged.  HLD: LDL 79.  She remains on atorvastatin and ezetimibe.  Carotid artery disease: Status post left-sided CEA in 08/2014. Carotid artery ultrasound from 07/2021 demonstrated bilateral 1 to 39% stenoses.  He remains on aspirin, atorvastatin, and ezetimibe.  Follow-up with vascular surgery as directed.  COPD: Stable.  Follow-up with pulmonology as directed.   Disposition: F/u with Dr. Rockey Situ or an APP in 6 months.   Medication Adjustments/Labs and Tests Ordered: Current medicines are reviewed at length with the patient today.  Concerns regarding medicines are outlined above. Medication changes, Labs and Tests ordered today are summarized above and listed in the Patient Instructions accessible in Encounters.   Signed, Christell Faith, PA-C 03/04/2022 2:58 PM     [address], [place] [address] [phone]

## 2022-03-04 ENCOUNTER — Ambulatory Visit: Payer: 59 | Attending: Physician Assistant | Admitting: Physician Assistant

## 2022-03-04 ENCOUNTER — Encounter: Payer: Self-pay | Admitting: Physician Assistant

## 2022-03-04 VITALS — BP 134/74 | HR 78 | Ht 68.0 in | Wt 169.0 lb

## 2022-03-04 DIAGNOSIS — I471 Supraventricular tachycardia, unspecified: Secondary | ICD-10-CM

## 2022-03-04 DIAGNOSIS — E785 Hyperlipidemia, unspecified: Secondary | ICD-10-CM

## 2022-03-04 DIAGNOSIS — I10 Essential (primary) hypertension: Secondary | ICD-10-CM

## 2022-03-04 DIAGNOSIS — I6523 Occlusion and stenosis of bilateral carotid arteries: Secondary | ICD-10-CM

## 2022-03-04 DIAGNOSIS — I480 Paroxysmal atrial fibrillation: Secondary | ICD-10-CM

## 2022-03-04 DIAGNOSIS — I2510 Atherosclerotic heart disease of native coronary artery without angina pectoris: Secondary | ICD-10-CM

## 2022-03-04 DIAGNOSIS — I5189 Other ill-defined heart diseases: Secondary | ICD-10-CM | POA: Diagnosis not present

## 2022-03-04 DIAGNOSIS — I447 Left bundle-branch block, unspecified: Secondary | ICD-10-CM | POA: Diagnosis not present

## 2022-03-04 MED ORDER — AMLODIPINE BESYLATE 5 MG PO TABS: 5.0000 mg | ORAL_TABLET | ORAL | 3 refills | Status: DC | PRN

## 2022-03-04 NOTE — Patient Instructions (Signed)
Medication Instructions:  Your physician has recommended you make the following change in your medication:   DECREASE Amlodipine and take only AS NEEDED for SBP (top blood pressure number) greater than 140  *If you need a refill on your cardiac medications before your next appointment, please call your pharmacy*   Lab Work: None  If you have labs (blood work) drawn today and your tests are completely normal, you will receive your results only by: [place] (if you have MyChart) OR A paper copy in the mail If you have any lab test that is abnormal or we need to change your treatment, we will call you to review the results.   Testing/Procedures: None   Follow-Up: At [facility], you and your health needs are our priority.  As part of our continuing mission to provide you with exceptional heart care, we have created designated Provider Care Teams.  These Care Teams include your primary Cardiologist (physician) and Advanced Practice Providers (APPs -  Physician Assistants and Nurse Practitioners) who all work together to provide you with the care you need, when you need it.  We recommend signing up for the patient portal called "MyChart".  Sign up information is provided on this After Visit Summary.  MyChart is used to connect with patients for Virtual Visits (Telemedicine).  Patients are able to view lab/test results, encounter notes, upcoming appointments, etc.  Non-urgent messages can be sent to your provider as well.   To learn more about what you can do with MyChart, go to [email or web address].    Your next appointment:   6 month(s)  Provider:   Ida Rogue, MD or Christell Faith, PA-C

## 2022-03-31 ENCOUNTER — Other Ambulatory Visit: Payer: Self-pay | Admitting: Cardiovascular Disease

## 2022-04-01 ENCOUNTER — Ambulatory Visit: Payer: 59 | Admitting: Gastroenterology

## 2022-05-20 ENCOUNTER — Ambulatory Visit: Payer: 59 | Admitting: Gastroenterology

## 2022-05-31 ENCOUNTER — Other Ambulatory Visit: Payer: Self-pay | Admitting: Pulmonary Disease

## 2022-06-10 ENCOUNTER — Other Ambulatory Visit: Payer: Self-pay | Admitting: Physician Assistant

## 2022-06-10 ENCOUNTER — Other Ambulatory Visit: Payer: Self-pay | Admitting: Family Medicine

## 2022-06-12 ENCOUNTER — Other Ambulatory Visit: Payer: Self-pay | Admitting: Cardiovascular Disease

## 2022-06-12 DIAGNOSIS — I480 Paroxysmal atrial fibrillation: Secondary | ICD-10-CM

## 2022-06-12 NOTE — Telephone Encounter (Signed)
Refill request

## 2022-06-12 NOTE — Telephone Encounter (Signed)
Prescription refill request for Eliquis received. Indication: a fib  Last office visit: 03/04/22 Scr: 0.97 10/23/21 epic Age: 77 Weight: 76kg

## 2022-06-18 ENCOUNTER — Encounter: Payer: Self-pay | Admitting: Gastroenterology

## 2022-06-18 ENCOUNTER — Ambulatory Visit (INDEPENDENT_AMBULATORY_CARE_PROVIDER_SITE_OTHER): Payer: 59 | Admitting: Gastroenterology

## 2022-06-18 VITALS — BP 131/75 | HR 63 | Temp 98.2°F | Ht 71.0 in | Wt 170.0 lb

## 2022-06-18 DIAGNOSIS — R160 Hepatomegaly, not elsewhere classified: Secondary | ICD-10-CM

## 2022-06-18 DIAGNOSIS — R109 Unspecified abdominal pain: Secondary | ICD-10-CM | POA: Diagnosis not present

## 2022-06-18 DIAGNOSIS — R140 Abdominal distension (gaseous): Secondary | ICD-10-CM | POA: Diagnosis not present

## 2022-06-18 NOTE — Patient Instructions (Addendum)
Your CT scan has been scheduled on Jun 26, 2022, arrive at 10:45AM to register. This is located at [facility], [address] entrance  You can not have anything to eat or drink after 6:45AM

## 2022-06-18 NOTE — Progress Notes (Signed)
Primary Care Physician: Alba Cory, MD  Primary Gastroenterologist:  Dr. Midge Minium  Chief Complaint  Patient presents with   Gastroesophageal Reflux   Bloated    HPI: Kristie Walters is a 77 y.o. female here with a continued report of abdominal bloating.  The patient states that she feels like her abdomen is bloated all the time.  The patient had a CT scan of the abdomen 2 years ago for this abdominal bloating without any cause of the bloating seen.  The patient also reports that she is moving her bowels once a day.  She was found to have hepatomegaly on that imaging.  The patient denies any unexplained weight loss fevers chills nausea vomiting black stools or bloody stools.  She also reports that she partakes in many dairy products including ice cream a few times a week.  Past Medical History:  Diagnosis Date   Allergy    Atrial fibrillation (HCC)    Basal cell carcinoma of nose 08/12/2017   Carotid artery disease (HCC)    s/p L. CEA in July 2016   Centrilobular emphysema (HCC) 09/29/2017   Collagen vascular disease (HCC)    Left carotid stenosis   COPD (chronic obstructive pulmonary disease) (HCC)    Coronary artery disease 09/29/2017   Noted on chest CT July 2019   Dysrhythmia    Elevated blood pressure (not hypertension)    Fatty liver 09/29/2017   Chest CT July 2019   GERD (gastroesophageal reflux disease)    Hyperlipidemia    Personal history of tobacco use, presenting hazards to health 08/31/2015   PONV (postoperative nausea and vomiting)     Current Outpatient Medications  Medication Sig Dispense Refill   albuterol (VENTOLIN HFA) 108 (90 Base) MCG/ACT inhaler Inhale 2 puffs into the lungs every 6 (six) hours as needed for wheezing or shortness of breath. 8 g 2   amLODipine (NORVASC) 5 MG tablet Take 1 tablet (5 mg total) by mouth as needed (AS NEEDED for SBP (top blood pressure number) greater than 140). 90 tablet 3   aspirin 81 MG tablet Take 81 mg by mouth  daily.     atorvastatin (LIPITOR) 40 MG tablet TAKE 1 TABLET BY MOUTH EVERY DAY 90 tablet 3   calcium carbonate (OS-CAL - DOSED IN MG OF ELEMENTAL CALCIUM) 1250 (500 Ca) MG tablet Take 1 tablet by mouth.     cholecalciferol (VITAMIN D) 1000 units tablet Take 1,000 Units by mouth daily.     Cyanocobalamin (VITAMIN B-12) 5000 MCG SUBL Place under the tongue.     ELIQUIS 5 MG TABS tablet TAKE 1 TABLET BY MOUTH TWICE A DAY 180 tablet 1   ezetimibe (ZETIA) 10 MG tablet TAKE 1 TABLET BY MOUTH EVERY DAY 90 tablet 2   Fish Oil-Cholecalciferol (FISH OIL + D3 PO) Take 1,000 mg by mouth 1 day or 1 dose.     fluticasone (FLONASE) 50 MCG/ACT nasal spray Place 2 sprays into both nostrils daily. 16 g 6   Fluticasone-Umeclidin-Vilant (TRELEGY ELLIPTA) 100-62.5-25 MCG/ACT AEPB INHALE 1 PUFF BY MOUTH EVERY DAY 60 each 6   metoprolol succinate (TOPROL-XL) 25 MG 24 hr tablet TAKE 1 TABLET (25 MG TOTAL) BY MOUTH DAILY. 90 tablet 0   metoprolol tartrate (LOPRESSOR) 25 MG tablet Take 1 tablet (25 mg total) by mouth 2 (two) times daily as needed (As needed for palpitations). 180 tablet 3   omeprazole (PRILOSEC) 40 MG capsule TAKE 1 CAPSULE (40 MG TOTAL) BY MOUTH  AS NEEDED 90 capsule 0   pregabalin (LYRICA) 50 MG capsule Take 1 capsule (50 mg total) by mouth 3 (three) times daily. 270 capsule 1   No current facility-administered medications for this visit.    Allergies as of 06/18/2022 - Review Complete 06/18/2022  Allergen Reaction Noted   Morphine Nausea Only and Nausea And Vomiting 04/12/2015   Morphine and related Nausea And Vomiting 07/08/2014   Zoster vaccine live Itching and Rash 08/09/2021    ROS:  General: Negative for anorexia, weight loss, fever, chills, fatigue, weakness. ENT: Negative for hoarseness, difficulty swallowing , nasal congestion. CV: Negative for chest pain, angina, palpitations, dyspnea on exertion, peripheral edema.  Respiratory: Negative for dyspnea at rest, dyspnea on exertion,  cough, sputum, wheezing.  GI: See history of present illness. GU:  Negative for dysuria, hematuria, urinary incontinence, urinary frequency, nocturnal urination.  Endo: Negative for unusual weight change.    Physical Examination:   BP 131/75 (BP Location: Left Arm, Patient Position: Sitting, Cuff Size: Normal)   Pulse 63   Temp 98.2 F (36.8 C) (Oral)   Ht 5\' 11"  (1.803 m)   Wt 170 lb (77.1 kg)   BMI 23.71 kg/m   General: Well-nourished, well-developed in no acute distress.  Eyes: No icterus. Conjunctivae pink. Lungs: Clear to auscultation bilaterally. Non-labored. Heart: Regular rate and rhythm, no murmurs rubs or gallops.  Abdomen: Bowel sounds are normal, nontender, nondistended, no hepatosplenomegaly or masses, no abdominal bruits or hernia , no rebound or guarding.   Extremities: No lower extremity edema. No clubbing or deformities. Neuro: Alert and oriented x 3.  Grossly intact. Skin: Warm and dry, no jaundice.   Psych: Alert and cooperative, normal mood and affect.  Labs:    Imaging Studies: No results found.  Assessment and Plan:   Kristie Walters is a 77 y.o. y/o female who comes in with worsening abdominal distention and bloating with abdominal discomfort.  The patient has been told to avoid dairy products for 1 week to see if that helps with her bloating.  The patient also will have her labs checked for LFTs since she has been found to have hepatomegaly.  The patient will get a repeat CT scan to evaluate if her hepatomegaly has gotten worse and for her continued abdominal distention with worsening of the distention.  The patient has been explained the plan agrees with it.     Midge Minium, MD. Clementeen Graham    Note: This dictation was prepared with Dragon dictation along with smaller phrase technology. Any transcriptional errors that result from this process are unintentional.

## 2022-06-19 LAB — HEPATIC FUNCTION PANEL
ALT: 28 IU/L (ref 0–32)
AST: 32 IU/L (ref 0–40)
Albumin: 4.2 g/dL (ref 3.8–4.8)
Alkaline Phosphatase: 120 IU/L (ref 44–121)
Bilirubin Total: 0.5 mg/dL (ref 0.0–1.2)
Bilirubin, Direct: 0.15 mg/dL (ref 0.00–0.40)
Total Protein: 6.6 g/dL (ref 6.0–8.5)

## 2022-06-26 ENCOUNTER — Ambulatory Visit
Admission: RE | Admit: 2022-06-26 | Discharge: 2022-06-26 | Disposition: A | Discharge status: Home / Self Care | Payer: 59 | Source: Ambulatory Visit | Attending: Gastroenterology | Admitting: Gastroenterology

## 2022-06-26 DIAGNOSIS — R109 Unspecified abdominal pain: Secondary | ICD-10-CM | POA: Diagnosis not present

## 2022-06-26 DIAGNOSIS — R140 Abdominal distension (gaseous): Secondary | ICD-10-CM | POA: Diagnosis not present

## 2022-06-26 LAB — POCT I-STAT CREATININE: Creatinine, Ser: 1.1 mg/dL — ABNORMAL HIGH (ref 0.44–1.00)

## 2022-06-26 MED ORDER — IOHEXOL 300 MG/ML  SOLN
100.0000 mL | Freq: Once | INTRAMUSCULAR | Status: AC | PRN
  Administered 2022-06-26: 100 mL via INTRAVENOUS

## 2022-07-05 ENCOUNTER — Other Ambulatory Visit: Payer: Self-pay | Admitting: Family Medicine

## 2022-07-19 ENCOUNTER — Encounter (INDEPENDENT_AMBULATORY_CARE_PROVIDER_SITE_OTHER): Payer: Medicare Other

## 2022-07-19 ENCOUNTER — Ambulatory Visit (INDEPENDENT_AMBULATORY_CARE_PROVIDER_SITE_OTHER): Payer: Medicare Other | Admitting: Nurse Practitioner

## 2022-07-24 ENCOUNTER — Other Ambulatory Visit (INDEPENDENT_AMBULATORY_CARE_PROVIDER_SITE_OTHER): Payer: Self-pay | Admitting: Vascular Surgery

## 2022-07-24 DIAGNOSIS — I6523 Occlusion and stenosis of bilateral carotid arteries: Secondary | ICD-10-CM

## 2022-07-26 ENCOUNTER — Encounter: Payer: Self-pay | Admitting: Pulmonary Disease

## 2022-07-26 NOTE — Progress Notes (Signed)
Subjective:    Patient ID: Kristie Walters, female    DOB: May 30, 1945, 77 y.o.   MRN: [id number] Patient Care Team: Alba Cory, MD as PCP - General (Family Medicine) Antonieta Iba, MD as PCP - Cardiology (Cardiology) Wyn Quaker Marlow Baars, MD as Consulting Physician (Vascular Surgery) Abbie Sons, MD as Referring Physician (Dermatology) Antonieta Iba, MD as Consulting Physician (Cardiology)  Chief Complaint  Patient presents with   Follow-up    SOB with exertion.    Requesting MD/Service: Alba Cory, MD Date of initial consultation: 02 December 2019 Reason for consultation: Emphysema/shortness of breath   PT PROFILE: 77 year old former smoker (quit 2018) with a 55-pack-year history of smoking presents for evaluation of shortness of breath and emphysema.   DATA: 03/26/2019 2D echo: G2 DD, mild elevation of pulmonary artery systolic pressure, LVEF 55 to 47% 09/16/2019 low-dose lung cancer screening CT: Multiple small inflammatory groundglass nodules, mild diffuse bronchial wall thickening with mild centrilobular and paraseptal emphysema, no evidence of fibrosis 03/28/2020 PFTs: FEV1 1.68 L or 59% predicted, FVC 2.06 L or 54% predicted, FEV1/FVC 82%, no bronchodilator response.  Lung volumes moderately reduced with ERV at 14% consistent with obesity.  Diffusion capacity moderately reduced and corrects for alveolar volume. 02/14/2021 LDCT: Lung RADS 2S, small solid and subsolid pulmonary nodules scattered throughout the lungs similar in size, number and distribution to prior study.  Largest is on the left lower lobe subsolid 10.9 mm in size with no change.  Extensive bilateral apical nodular pleural-parenchymal thickening and architectural distortion similar to prior.  INTERVAL: Follow-up from 01/02/2021, at that time she was seen by Rubye Oaks, NP. by Rubye Oaks, NP.  No overt issues during that visit.  Has been followed through lung cancer screening program.  Advised to follow-up 6 months.   This is a scheduled follow-up visit.  HPI Kristie Walters presents today for follow-up.  She has not had any significant changes since her last visit.  Continues to be compliant with Trelegy Ellipta and as needed albuterol.  She continues to experience shortness of breath on exertion but this has not changed in character.  Describes "good and bad days".  No significant cough or sputum production.  No fevers, chills or sweats.  No recent flares.  She does not endorse any other symptomatology.  Has not had any sleep issues.  Overall she feels well and looks well.   Review of Systems A 10 point review of systems was performed and it is as noted above otherwise negative.  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   Fatty liver 09/29/2017   Coronary artery disease 09/29/2017   Centrilobular emphysema (HCC) 09/29/2017   Basal cell carcinoma of nose 08/12/2017   Estrogen deficiency 08/01/2017   Aortic atherosclerosis (HCC) 09/14/2015   Personal history of tobacco use, presenting hazards to health 08/31/2015   Paresthesia of foot, bilateral 08/15/2015   Breast mass, right 07/12/2015   Elevated alkaline phosphatase level 05/29/2015   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   Gastroesophageal reflux disease without esophagitis 04/26/2015   Hyperlipidemia 04/26/2015   Hyperglycemia 04/26/2015   Carotid stenosis 08/25/2014   H/O malignant neoplasm of skin 08/13/2012   Social History   Tobacco Use   Smoking status: Former    Packs/day: 1.00  Years: 55.00    Additional pack years: 0.00    Total pack years: 55.00    Types: Cigarettes    Quit date: 11/26/2016    Years since quitting: 5.6    Passive exposure: Past   Smokeless tobacco: Former    Types: Snuff   Tobacco  comments:    smoking cessation materials not required  Substance Use Topics   Alcohol use: No   Allergies  Allergen Reactions   Morphine Nausea Only and Nausea And Vomiting   Morphine And Codeine Nausea And Vomiting   Zoster Vaccine Live Itching and Rash    Current Meds  Medication Sig   albuterol (VENTOLIN HFA) 108 (90 Base) MCG/ACT inhaler Inhale 2 puffs into the lungs every 6 (six) hours as needed for wheezing or shortness of breath.   aspirin 81 MG tablet Take 81 mg by mouth daily.   calcium carbonate (OS-CAL - DOSED IN MG OF ELEMENTAL CALCIUM) 1250 (500 Ca) MG tablet Take 1 tablet by mouth.   cholecalciferol (VITAMIN D) 1000 units tablet Take 1,000 Units by mouth daily.   Cyanocobalamin (VITAMIN B-12) 5000 MCG SUBL Place under the tongue.   Fish Oil-Cholecalciferol (FISH OIL + D3 PO) Take 1,000 mg by mouth 1 day or 1 dose.   fluticasone (FLONASE) 50 MCG/ACT nasal spray Place 2 sprays into both nostrils daily.   atorvastatin (LIPITOR) 40 MG tablet TAKE 1 TABLET BY MOUTH EVERY DAY   ELIQUIS 5 MG TABS tablet TAKE 1 TABLET BY MOUTH TWICE A DAY   ezetimibe (ZETIA) 10 MG tablet TAKE 1 TABLET BY MOUTH EVERY DAY   metoprolol succinate (TOPROL XL) 25 MG 24 hr tablet Take 1 tablet (25 mg total) by mouth daily.   omeprazole (PRILOSEC) 40 MG capsule TAKE 1 CAPSULE (40 MG TOTAL) BY MOUTH AS NEEDED   pregabalin (LYRICA) 50 MG capsule Take 1 capsule (50 mg total) by mouth 3 (three) times daily.   TRELEGY ELLIPTA 100-62.5-25 MCG/ACT AEPB TAKE 1 PUFF BY MOUTH EVERY DAY   Immunization History  Administered Date(s) Administered   Influenza, High Dose Seasonal PF 10/08/2016, 10/01/2018, 10/08/2019, 11/01/2020   Influenza-Unspecified 11/19/2014, 11/16/2015, 10/08/2016, 09/18/2017   Moderna Covid-19 Vaccine Bivalent Booster 12yrs & up 11/15/2020   Moderna Sars-Covid-2 Vaccination 01/05/2020, 07/27/2020   PFIZER(Purple Top)SARS-COV-2 Vaccination 04/21/2019, 05/12/2019   Pneumococcal Conjugate-13  11/18/2016   Pneumococcal Polysaccharide-23 06/11/2012, 06/01/2020   Td 02/18/2009   Td (Adult), 2 Lf Tetanus Toxid, Preservative Free 02/18/2009   Tdap 04/25/2021   Zoster Recombinat (Shingrix) 04/25/2021       Objective:   Physical Exam BP 110/68 (BP Location: Left Arm, Cuff Size: Large)   Pulse 75   Temp 97.8 F (36.6 C) (Temporal)   Ht 5\' 11"  (1.803 m)   Wt 172 lb 12.8 oz (78.4 kg)   SpO2 96%   BMI 24.10 kg/m   SpO2: 96 % O2 Device: None (Room air)  GENERAL: Well-developed well-nourished woman in no acute distress.  She ambulates with assistance of a cane. HEAD: Normocephalic, atraumatic.  EYES: Pupils equal, round, reactive to light.  No scleral icterus.  MOUTH: Upper dentures, oral mucosa moist.  No thrush. NECK: Supple. No thyromegaly. Trachea midline. No JVD.  No adenopathy. PULMONARY: Good air entry bilaterally.  Coarse breath sounds otherwise no adventitious sounds. CARDIOVASCULAR: S1 and S2. Regular rate and rhythm.  No rubs, murmurs or gallops heard. ABDOMEN: Benign. MUSCULOSKELETAL: No joint deformity, no clubbing, no edema.  NEUROLOGIC: No focal deficit noted, speech is  fluent.  Gait steady with assistance (cane). SKIN: Intact,warm,dry. PSYCH: Normal mood, normal behavior.      Assessment & Plan:     ICD-10-CM   1. COPD with chronic bronchitis and emphysema (HCC)  J44.9    Continue Trelegy Ellipta Continue as needed albuterol    2. Former heavy tobacco smoker  Z87.891    Patient enrolled in lung cancer screening program     Overall Anjolina looks to be well compensated from her COPD standpoint.  She is to continue participation lung cancer screening program.  Continue Trelegy and as needed albuterol.  We will see her in follow-up in 6 months time she is to call sooner should any new problems arise.  Gailen Shelter, MD Advanced Bronchoscopy [facility]    *This note was dictated using voice recognition software/Dragon.   Despite best efforts to proofread, errors can occur which can change the meaning. Any transcriptional errors that result from this process are unintentional and may not be fully corrected at the time of dictation.

## 2022-07-31 ENCOUNTER — Encounter (INDEPENDENT_AMBULATORY_CARE_PROVIDER_SITE_OTHER): Payer: Self-pay | Admitting: Nurse Practitioner

## 2022-07-31 ENCOUNTER — Ambulatory Visit (INDEPENDENT_AMBULATORY_CARE_PROVIDER_SITE_OTHER): Payer: 59 | Admitting: Nurse Practitioner

## 2022-07-31 ENCOUNTER — Ambulatory Visit (INDEPENDENT_AMBULATORY_CARE_PROVIDER_SITE_OTHER): Payer: 59

## 2022-07-31 VITALS — BP 121/71 | HR 61 | Resp 18 | Ht 71.0 in | Wt 170.8 lb

## 2022-07-31 DIAGNOSIS — I6523 Occlusion and stenosis of bilateral carotid arteries: Secondary | ICD-10-CM

## 2022-07-31 DIAGNOSIS — E782 Mixed hyperlipidemia: Secondary | ICD-10-CM | POA: Diagnosis not present

## 2022-07-31 DIAGNOSIS — K219 Gastro-esophageal reflux disease without esophagitis: Secondary | ICD-10-CM | POA: Diagnosis not present

## 2022-07-31 NOTE — Progress Notes (Signed)
Subjective:    Patient ID: Kristie Walters, female    DOB: [birth year], 77 y.o.   MRN: [id number] Chief Complaint  Patient presents with   Follow-up    Follow up  37yr carotid.    Patient returns in follow up of her carotid disease.  She is 8 years s/p left CEA. Doing well today. No complaints. Specifically, the patient denies amaurosis fugax, speech or swallowing difficulties, or arm or leg weakness or numbness. Carotid duplex shows a patent left CEA and 1-39% right ICA stenosis.    Review of Systems  All other systems reviewed and are negative.      Objective:   Physical Exam Vitals reviewed.  HENT:     Head: Normocephalic.  Neck:     Vascular: No carotid bruit.  Cardiovascular:     Rate and Rhythm: Normal rate.     Pulses: Normal pulses.  Pulmonary:     Effort: Pulmonary effort is normal.  Skin:    General: Skin is warm and dry.  Neurological:     Mental Status: She is alert and oriented to person, place, and time.  Psychiatric:        Mood and Affect: Mood normal.        Behavior: Behavior normal.        Thought Content: Thought content normal.        Judgment: Judgment normal.     BP 121/71 (BP Location: Left Arm)   Pulse 61   Resp 18   Ht 5\' 11"  (1.803 m)   Wt 170 lb 12.8 oz (77.5 kg)   BMI 23.82 kg/m   Past Medical History:  Diagnosis Date   Allergy    Atrial fibrillation (HCC)    Basal cell carcinoma of nose 08/12/2017   Carotid artery disease (HCC)    s/p L. CEA in July 2016   Centrilobular emphysema (HCC) 09/29/2017   Collagen vascular disease (HCC)    Left carotid stenosis   COPD (chronic obstructive pulmonary disease) (HCC)    Coronary artery disease 09/29/2017   Noted on chest CT July 2019   Dysrhythmia    Elevated blood pressure (not hypertension)    Fatty liver 09/29/2017   Chest CT July 2019   GERD (gastroesophageal reflux disease)    Hyperlipidemia    Personal history of tobacco use, presenting hazards to health 08/31/2015   PONV  (postoperative nausea and vomiting)     Social History   Socioeconomic History   Marital status: Divorced    Spouse name: Not on file   Number of children: 2   Years of education: Not on file   Highest education level: 10th grade  Occupational History   Occupation: Retired  Tobacco Use   Smoking status: Former    Packs/day: 1.00    Years: 55.00    Additional pack years: 0.00    Total pack years: 55.00    Types: Cigarettes    Quit date: 11/26/2016    Years since quitting: 5.6    Passive exposure: Past   Smokeless tobacco: Former    Types: Snuff   Tobacco comments:    smoking cessation materials not required  Vaping Use   Vaping Use: Never used  Substance and Sexual Activity   Alcohol use: No   Drug use: No   Sexual activity: Not Currently  Other Topics Concern   Not on file  Social History Narrative    Pt lives alone   Social Determinants of Health  Financial Resource Strain: Medium Risk (08/09/2021)   Overall Financial Resource Strain (CARDIA)    Difficulty of Paying Living Expenses: Somewhat hard  Food Insecurity: No Food Insecurity (08/09/2021)   Hunger Vital Sign    Worried About Running Out of Food in the Last Year: Never true    Ran Out of Food in the Last Year: Never true  Transportation Needs: No Transportation Needs (08/09/2021)   PRAPARE - Administrator, Civil Service (Medical): No    Lack of Transportation (Non-Medical): No  Physical Activity: Insufficiently Active (08/09/2021)   Exercise Vital Sign    Days of Exercise per Week: 1 day    Minutes of Exercise per Session: 10 min  Stress: No Stress Concern Present (08/09/2021)   [employer] of Occupational Health - Occupational Stress Questionnaire    Feeling of Stress : Only a little  Social Connections: Moderately Isolated (08/08/2020)   Social Connection and Isolation Panel [NHANES]    Frequency of Communication with Friends and Family: More than three times a week    Frequency of  Social Gatherings with Friends and Family: Three times a week    Attends Religious Services: More than 4 times per year    Active Member of Clubs or Organizations: No    Attends Banker Meetings: Never    Marital Status: Divorced  Catering manager Violence: Not At Risk (08/09/2021)   Humiliation, Afraid, Rape, and Kick questionnaire    Fear of Current or Ex-Partner: No    Emotionally Abused: No    Physically Abused: No    Sexually Abused: No    Past Surgical History:  Procedure Laterality Date   BREAST BIOPSY     COLONOSCOPY WITH PROPOFOL N/A 08/22/2020   Procedure: COLONOSCOPY WITH PROPOFOL;  Surgeon: Midge Minium, MD;  Location: [facility] ENDOSCOPY;  Service: Endoscopy;  Laterality: N/A;   ENDARTERECTOMY Left 08/25/2014   Procedure: ENDARTERECTOMY CAROTID;  Surgeon: Annice Needy, MD;  Location: [facility] ORS;  Service: Vascular;  Laterality: Left;   MOHS SURGERY     MOHS SURGERY  09/23/2017   nose   PERIPHERAL VASCULAR CATHETERIZATION N/A 07/20/2014   Procedure: Carotid Angiography;  Surgeon: Annice Needy, MD;  Location: [facility] INVASIVE CV LAB;  Service: Cardiovascular;  Laterality: N/A;   TONSILLECTOMY     TUBAL LIGATION      Family History  Problem Relation Age of Onset   Heart attack Mother    Hypercholesterolemia Mother    Hypertension Mother    Peripheral vascular disease Mother    Dementia Mother    Hypothyroidism Mother    CVA Father    Liver cancer Father    Diabetes Brother    Kidney cancer Sister    Diabetes Brother    Alzheimer's disease Brother    Other Brother        alzheimers   Lymphoma Son    HIV Son    Breast cancer Neg Hx     Allergies  Allergen Reactions   Morphine Nausea Only and Nausea And Vomiting   Morphine And Codeine Nausea And Vomiting   Zoster Vaccine Live Itching and Rash       Latest Ref Rng & Units 10/24/2021   10:30 AM 08/03/2021    2:21 PM 04/18/2021    2:23 PM  CBC  WBC 4.0 - 10.5 K/uL 6.7  8.8  9.5   Hemoglobin 12.0 - 15.0 g/dL  40.9  81.1  91.4   Hematocrit 36.0 - 46.0 %  40.3  40.6  42.6   Platelets 150 - 400 K/uL 165  254  257       CMP     Component Value Date/Time   NA 140 10/24/2021 1030   NA 143 02/24/2020 1113   NA 141 02/20/2014 1016   K 3.8 10/24/2021 1030   K 4.0 02/20/2014 1016   CL 111 10/24/2021 1030   CL 106 02/20/2014 1016   CO2 23 10/24/2021 1030   CO2 31 02/20/2014 1016   GLUCOSE 203 (H) 10/24/2021 1030   GLUCOSE 92 02/20/2014 1016   BUN 17 10/24/2021 1030   BUN 14 02/24/2020 1113   BUN 11 02/20/2014 1016   CREATININE 1.10 (H) 06/26/2022 1106   CREATININE 0.90 04/18/2021 1423   CALCIUM 8.4 (L) 10/24/2021 1030   CALCIUM 9.2 02/20/2014 1016   PROT 6.6 06/18/2022 1612   PROT 8.4 (H) 06/24/2013 0010   ALBUMIN 4.2 06/18/2022 1612   ALBUMIN 4.0 06/24/2013 0010   AST 32 06/18/2022 1612   AST 22 06/24/2013 0010   ALT 28 06/18/2022 1612   ALT 32 06/24/2013 0010   ALKPHOS 120 06/18/2022 1612   ALKPHOS 198 (H) 06/24/2013 0010   BILITOT 0.5 06/18/2022 1612   BILITOT 0.5 06/24/2013 0010   EGFR 67 04/18/2021 1423   GFRNONAA >60 10/24/2021 1030   GFRNONAA 68 06/30/2020 1043     No results found.     Assessment & Plan:   1. Bilateral carotid artery stenosis Recommend:  Given the patient's asymptomatic subcritical stenosis no further invasive testing or surgery at this time.  Duplex ultrasound shows <40% stenosis bilaterally.  Continue antiplatelet therapy as prescribed Continue management of CAD, HTN and Hyperlipidemia Healthy heart diet,  encouraged exercise at least 4 times per week Follow up in 12 months with duplex ultrasound and physical exam   2. Mixed hyperlipidemia Continue statin as ordered and reviewed, no changes at this time  3. Gastroesophageal reflux disease without esophagitis Continue PPI as already ordered, this medication has been reviewed and there are no changes at this time.  Avoidence of caffeine and alcohol  Moderate elevation of the head of the  bed    Current Outpatient Medications on File Prior to Visit  Medication Sig Dispense Refill   albuterol (VENTOLIN HFA) 108 (90 Base) MCG/ACT inhaler Inhale 2 puffs into the lungs every 6 (six) hours as needed for wheezing or shortness of breath. 8 g 2   amLODipine (NORVASC) 5 MG tablet Take 1 tablet (5 mg total) by mouth as needed (AS NEEDED for SBP (top blood pressure number) greater than 140). 90 tablet 3   aspirin 81 MG tablet Take 81 mg by mouth daily.     atorvastatin (LIPITOR) 40 MG tablet TAKE 1 TABLET BY MOUTH EVERY DAY 90 tablet 0   calcium carbonate (OS-CAL - DOSED IN MG OF ELEMENTAL CALCIUM) 1250 (500 Ca) MG tablet Take 1 tablet by mouth.     cholecalciferol (VITAMIN D) 1000 units tablet Take 1,000 Units by mouth daily.     Cyanocobalamin (VITAMIN B-12) 5000 MCG SUBL Place under the tongue.     ELIQUIS 5 MG TABS tablet TAKE 1 TABLET BY MOUTH TWICE A DAY 180 tablet 1   ezetimibe (ZETIA) 10 MG tablet TAKE 1 TABLET BY MOUTH EVERY DAY 90 tablet 2   Fish Oil-Cholecalciferol (FISH OIL + D3 PO) Take 1,000 mg by mouth 1 day or 1 dose.     fluticasone (FLONASE) 50 MCG/ACT nasal spray Place  2 sprays into both nostrils daily. 16 g 6   Fluticasone-Umeclidin-Vilant (TRELEGY ELLIPTA) 100-62.5-25 MCG/ACT AEPB INHALE 1 PUFF BY MOUTH EVERY DAY 60 each 6   metoprolol succinate (TOPROL-XL) 25 MG 24 hr tablet TAKE 1 TABLET (25 MG TOTAL) BY MOUTH DAILY. 90 tablet 0   metoprolol tartrate (LOPRESSOR) 25 MG tablet Take 1 tablet (25 mg total) by mouth 2 (two) times daily as needed (As needed for palpitations). 180 tablet 3   omeprazole (PRILOSEC) 40 MG capsule TAKE 1 CAPSULE (40 MG TOTAL) BY MOUTH AS NEEDED 90 capsule 0   pregabalin (LYRICA) 50 MG capsule Take 1 capsule (50 mg total) by mouth 3 (three) times daily. 270 capsule 1   No current facility-administered medications on file prior to visit.    There are no Patient Instructions on file for this visit. No follow-ups on file.   Georgiana Spinner,  NP

## 2022-08-08 ENCOUNTER — Telehealth: Payer: Self-pay | Admitting: Family Medicine

## 2022-08-08 ENCOUNTER — Ambulatory Visit (INDEPENDENT_AMBULATORY_CARE_PROVIDER_SITE_OTHER): Payer: 59

## 2022-08-08 VITALS — BP 120/60 | Ht 71.0 in | Wt 171.6 lb

## 2022-08-08 DIAGNOSIS — Z0000 Encounter for general adult medical examination without abnormal findings: Secondary | ICD-10-CM

## 2022-08-08 NOTE — Progress Notes (Signed)
Subjective:   Kristie Walters is a 77 y.o. female who presents for Medicare Annual (Subsequent) preventive examination.  Visit Complete: In person  Patient Medicare AWV questionnaire was completed by the patient on (not done); I have confirmed that all information answered by patient is correct and no changes since this date.  Review of Systems    Cardiac Risk Factors include: advanced age (>19men, >24 women);dyslipidemia;sedentary lifestyle     Objective:    Today's Vitals   08/08/22 1025  BP: 120/60  Weight: 171 lb 9.6 oz (77.8 kg)  Height: 5\' 11"  (1.803 m)   Body mass index is 23.93 kg/m.     08/08/2022   10:55 AM 10/24/2021   10:27 AM 08/22/2020    9:45 AM 08/08/2020    9:36 AM 08/05/2019    9:37 AM 03/06/2019    4:58 PM 07/31/2018   10:54 AM  Advanced Directives  Does Patient Have a Medical Advance Directive? Yes Yes Yes Yes Yes No Yes  Type of Social research officer, government Power of [employer] Power of [place];Living will Healthcare Power of [place];Living will  Healthcare Power of [place];Living will  Copy of Healthcare Power of Attorney in Chart?    Yes - validated most recent copy scanned in chart (See row information) Yes - validated most recent copy scanned in chart (See row information)  Yes - validated most recent copy scanned in chart (See row information)  Would patient like information on creating a medical advance directive?      No - Patient declined     Current Medications (verified) Outpatient Encounter Medications as of 08/08/2022  Medication Sig   albuterol (VENTOLIN HFA) 108 (90 Base) MCG/ACT inhaler Inhale 2 puffs into the lungs every 6 (six) hours as needed for wheezing or shortness of breath.   amLODipine (NORVASC) 5 MG tablet Take 1 tablet (5 mg total) by mouth as needed (AS NEEDED for SBP (top blood pressure number) greater than 140).   aspirin 81 MG tablet Take 81 mg by mouth daily.    atorvastatin (LIPITOR) 40 MG tablet TAKE 1 TABLET BY MOUTH EVERY DAY   calcium carbonate (OS-CAL - DOSED IN MG OF ELEMENTAL CALCIUM) 1250 (500 Ca) MG tablet Take 1 tablet by mouth.   cholecalciferol (VITAMIN D) 1000 units tablet Take 1,000 Units by mouth daily.   Cyanocobalamin (VITAMIN B-12) 5000 MCG SUBL Place under the tongue.   ELIQUIS 5 MG TABS tablet TAKE 1 TABLET BY MOUTH TWICE A DAY   ezetimibe (ZETIA) 10 MG tablet TAKE 1 TABLET BY MOUTH EVERY DAY   Fish Oil-Cholecalciferol (FISH OIL + D3 PO) Take 1,000 mg by mouth 1 day or 1 dose.   fluticasone (FLONASE) 50 MCG/ACT nasal spray Place 2 sprays into both nostrils daily.   Fluticasone-Umeclidin-Vilant (TRELEGY ELLIPTA) 100-62.5-25 MCG/ACT AEPB INHALE 1 PUFF BY MOUTH EVERY DAY   metoprolol succinate (TOPROL-XL) 25 MG 24 hr tablet TAKE 1 TABLET (25 MG TOTAL) BY MOUTH DAILY.   metoprolol tartrate (LOPRESSOR) 25 MG tablet Take 1 tablet (25 mg total) by mouth 2 (two) times daily as needed (As needed for palpitations).   omeprazole (PRILOSEC) 40 MG capsule TAKE 1 CAPSULE (40 MG TOTAL) BY MOUTH AS NEEDED   pregabalin (LYRICA) 50 MG capsule Take 1 capsule (50 mg total) by mouth 3 (three) times daily.   No facility-administered encounter medications on file as of 08/08/2022.    Allergies (verified) Morphine, Morphine and codeine,  and Zoster vaccine live   History: Past Medical History:  Diagnosis Date   Allergy    Atrial fibrillation (HCC)    Basal cell carcinoma of nose 08/12/2017   Carotid artery disease (HCC)    s/p L. CEA in July 2016   Centrilobular emphysema (HCC) 09/29/2017   Collagen vascular disease (HCC)    Left carotid stenosis   COPD (chronic obstructive pulmonary disease) (HCC)    Coronary artery disease 09/29/2017   Noted on chest CT July 2019   Dysrhythmia    Elevated blood pressure (not hypertension)    Fatty liver 09/29/2017   Chest CT July 2019   GERD (gastroesophageal reflux disease)    Hyperlipidemia    Personal  history of tobacco use, presenting hazards to health 08/31/2015   PONV (postoperative nausea and vomiting)    Past Surgical History:  Procedure Laterality Date   BREAST BIOPSY     COLONOSCOPY WITH PROPOFOL N/A 08/22/2020   Procedure: COLONOSCOPY WITH PROPOFOL;  Surgeon: Midge Minium, MD;  Location: [facility] ENDOSCOPY;  Service: Endoscopy;  Laterality: N/A;   ENDARTERECTOMY Left 08/25/2014   Procedure: ENDARTERECTOMY CAROTID;  Surgeon: Annice Needy, MD;  Location: [facility] ORS;  Service: Vascular;  Laterality: Left;   MOHS SURGERY     MOHS SURGERY  09/23/2017   nose   PERIPHERAL VASCULAR CATHETERIZATION N/A 07/20/2014   Procedure: Carotid Angiography;  Surgeon: Annice Needy, MD;  Location: [facility] INVASIVE CV LAB;  Service: Cardiovascular;  Laterality: N/A;   TONSILLECTOMY     TUBAL LIGATION     Family History  Problem Relation Age of Onset   Heart attack Mother    Hypercholesterolemia Mother    Hypertension Mother    Peripheral vascular disease Mother    Dementia Mother    Hypothyroidism Mother    CVA Father    Liver cancer Father    Diabetes Brother    Kidney cancer Sister    Diabetes Brother    Alzheimer's disease Brother    Other Brother        alzheimers   Lymphoma Son    HIV Son    Breast cancer Neg Hx    Social History   Socioeconomic History   Marital status: Divorced    Spouse name: Not on file   Number of children: 2   Years of education: Not on file   Highest education level: 10th grade  Occupational History   Occupation: Retired  Tobacco Use   Smoking status: Former    Packs/day: 1.00    Years: 55.00    Additional pack years: 0.00    Total pack years: 55.00    Types: Cigarettes    Quit date: 11/26/2016    Years since quitting: 5.7    Passive exposure: Past   Smokeless tobacco: Former    Types: Snuff   Tobacco comments:    smoking cessation materials not required  Vaping Use   Vaping Use: Never used  Substance and Sexual Activity   Alcohol use: No   Drug use:  No   Sexual activity: Not Currently  Other Topics Concern   Not on file  Social History Narrative    Pt lives alone   Social Determinants of Health   Financial Resource Strain: Low Risk  (08/08/2022)   Overall Financial Resource Strain (CARDIA)    Difficulty of Paying Living Expenses: Not hard at all  Food Insecurity: No Food Insecurity (08/08/2022)   Hunger Vital Sign    Worried About  Running Out of Food in the Last Year: Never true    Ran Out of Food in the Last Year: Never true  Transportation Needs: No Transportation Needs (08/08/2022)   PRAPARE - Administrator, Civil Service (Medical): No    Lack of Transportation (Non-Medical): No  Physical Activity: Inactive (08/08/2022)   Exercise Vital Sign    Days of Exercise per Week: 0 days    Minutes of Exercise per Session: 0 min  Stress: Stress Concern Present (08/08/2022)   [employer] of Occupational Health - Occupational Stress Questionnaire    Feeling of Stress : To some extent  Social Connections: Moderately Isolated (08/08/2022)   Social Connection and Isolation Panel [NHANES]    Frequency of Communication with Friends and Family: More than three times a week    Frequency of Social Gatherings with Friends and Family: Three times a week    Attends Religious Services: More than 4 times per year    Active Member of Clubs or Organizations: No    Attends Banker Meetings: Never    Marital Status: Divorced    Tobacco Counseling Counseling given: Not Answered Tobacco comments: smoking cessation materials not required   Clinical Intake:  Pre-visit preparation completed: Yes  Pain : No/denies pain     BMI - recorded: 23.93 Nutritional Status: BMI of 19-24  Normal Nutritional Risks: None Diabetes: No  How often do you need to have someone help you when you read instructions, pamphlets, or other written materials from your doctor or pharmacy?: 1 - Never  Interpreter Needed?: No  Comments:  lives alone Information entered by :: B.Yarelly Kuba,LPN   Activities of Daily Living    08/08/2022   10:55 AM 12/12/2021   10:24 AM  In your present state of health, do you have any difficulty performing the following activities:  Hearing? 1 1  Vision? 1 0  Difficulty concentrating or making decisions? 0 0  Walking or climbing stairs? 1 1  Dressing or bathing? 0 0  Doing errands, shopping? 1 0  Preparing Food and eating ? N   Using the Toilet? N   In the past six months, have you accidently leaked urine? N   Do you have problems with loss of bowel control? N   Managing your Medications? N   Managing your Finances? N   Housekeeping or managing your Housekeeping? N     Patient Care Team: Alba Cory, MD as PCP - General (Family Medicine) Mariah Milling Tollie Pizza, MD as PCP - Cardiology (Cardiology) Wyn Quaker Marlow Baars, MD as Consulting Physician (Vascular Surgery) Abbie Sons, MD as Referring Physician (Dermatology) Antonieta Iba, MD as Consulting Physician (Cardiology)  Indicate any recent Medical Services you may have received from other than [facility] providers in the past year (date may be approximate).     Assessment:   This is a routine wellness examination for [place].  Hearing/Vision screen Hearing Screening - Comments:: Adequate hearing: says needs hearing aides Vision Screening - Comments:: Adequate vision with readers [facility]  Dietary issues and exercise activities discussed:     Goals Addressed               This Visit's Progress     DIET - INCREASE WATER INTAKE   On track     Recommend to drink at least 6-8 8oz glasses of water per day.      Exercise 3x per week (30 min per time)   Not on track  Recommend walking 3 times per week for at least 30 minutes      Patient Stated (pt-stated)   Not on track     She would like to see her son better from his cancer diagnosis.        Depression Screen    08/08/2022   10:46 AM 12/12/2021   10:26 AM  08/15/2021   10:08 AM 08/09/2021    9:23 AM 04/18/2021    1:29 PM 01/15/2021    1:57 PM 09/29/2020    1:43 PM  PHQ 2/9 Scores  PHQ - 2 Score 0 1 0 0 0 0 0  PHQ- 9 Score  2   0 2     Fall Risk    08/08/2022   10:42 AM 12/12/2021   10:24 AM 08/15/2021   10:07 AM 08/09/2021    9:23 AM 04/18/2021    1:29 PM  Fall Risk   Falls in the past year? 0 0 0 0 0  Number falls in past yr: 0  0 0 0  Injury with Fall? 0  0 0 0  Risk for fall due to : No Fall Risks Impaired balance/gait Impaired balance/gait  No Fall Risks  Follow up Education provided;Falls prevention discussed Falls prevention discussed;Education provided;Falls evaluation completed Falls prevention discussed Falls evaluation completed Education provided    MEDICARE RISK AT HOME:  Medicare Risk at Home - 08/08/22 1043     Any stairs in or around the home? Yes    If so, are there any without handrails? Yes    Home free of loose throw rugs in walkways, pet beds, electrical cords, etc? Yes    Adequate lighting in your home to reduce risk of falls? Yes    Life alert? Yes    Use of a cane, walker or w/c? Yes    Grab bars in the bathroom? Yes    Shower chair or bench in shower? Yes    Elevated toilet seat or a handicapped toilet? Yes             TIMED UP AND GO:  Was the test performed?  Yes  Length of time to ambulate 10 feet: 22 sec Gait slow and steady with assistive device    Cognitive Function:        08/08/2022   10:58 AM 08/05/2019    9:39 AM 07/31/2018   11:03 AM 07/25/2017   10:07 AM 11/18/2016   10:16 AM  6CIT Screen  What Year? 0 points 0 points 0 points 0 points 0 points  What month? 0 points 0 points 0 points 0 points 0 points  What time? 0 points 0 points 0 points 0 points 0 points  Count back from 20 0 points 0 points 0 points 0 points 0 points  Months in reverse 0 points 0 points 0 points 0 points 0 points  Repeat phrase 0 points 2 points 0 points 0 points 0 points  Total Score 0 points 2 points 0 points  0 points 0 points    Immunizations Immunization History  Administered Date(s) Administered   Fluad Quad(high Dose 65+) 11/03/2021   Influenza, High Dose Seasonal PF 10/08/2016, 10/01/2018, 10/08/2019, 11/01/2020   Influenza-Unspecified 11/19/2014, 11/16/2015, 10/08/2016, 09/18/2017   Moderna Covid-19 Vaccine Bivalent Booster 63yrs & up 11/15/2020, 12/05/2021   Moderna Sars-Covid-2 Vaccination 01/05/2020, 07/27/2020   PFIZER(Purple Top)SARS-COV-2 Vaccination 04/21/2019, 05/12/2019   Pneumococcal Conjugate-13 11/18/2016   Pneumococcal Polysaccharide-23 06/11/2012, 06/01/2020   Td 02/18/2009   Td (Adult),  2 Lf Tetanus Toxid, Preservative Free 02/18/2009   Tdap 04/25/2021   Zoster Recombinat (Shingrix) 04/25/2021    TDAP status: Up to date  Flu Vaccine status: Up to date  Pneumococcal vaccine status: Up to date  Covid-19 vaccine status: Completed vaccines  Qualifies for Shingles Vaccine? Yes   Zostavax completed Yes   Shingrix Completed?: Yes  Screening Tests Health Maintenance  Topic Date Due   COVID-19 Vaccine (7 - 2023-24 season) 01/30/2022   INFLUENZA VACCINE  09/19/2022   MAMMOGRAM  09/26/2022   Lung Cancer Screening  02/15/2023   Medicare Annual Wellness (AWV)  08/08/2023   DTaP/Tdap/Td (3 - Td or Tdap) 04/26/2031   Pneumonia Vaccine 62+ Years old  Completed   DEXA SCAN  Completed   Hepatitis C Screening  Completed   HPV VACCINES  Aged Out   Colonoscopy  Discontinued   Zoster Vaccines- Shingrix  Discontinued    Health Maintenance  Health Maintenance Due  Topic Date Due   COVID-19 Vaccine (7 - 2023-24 season) 01/30/2022    Colorectal cancer screening: No longer required.   Mammogram status: No longer required due to age.  Bone Density status: Completed yes. Results reflect: Bone density results: NORMAL. Repeat every 5 years.  Lung Cancer Screening: (Low Dose CT Chest recommended if Age 76-80 years, 20 pack-year currently smoking OR have quit w/in  15years.) does qualify. Scheduled for August 16, 2022  Lung Cancer Screening Referral: no already placed  Additional Screening:  Hepatitis C Screening: does not qualify; Completed yes  Vision Screening: Recommended annual ophthalmology exams for early detection of glaucoma and other disorders of the eye. Is the patient up to date with their annual eye exam?  Yes  Who is the provider or what is the name of the office in which the patient attends annual eye exams? [facility] If pt is not established with a provider, would they like to be referred to a provider to establish care? No .   Dental Screening: Recommended annual dental exams for proper oral hygiene  Diabetic Foot Exam: n/a  Community Resource Referral / Chronic Care Management: CRR required this visit?  No   CCM required this visit?  No APPT made with PCP    Plan:     I have personally reviewed and noted the following in the patient's chart:   Medical and social history Use of alcohol, tobacco or illicit drugs  Current medications and supplements including opioid prescriptions. Patient is not currently taking opioid prescriptions. Functional ability and status Nutritional status Physical activity Advanced directives List of other physicians Hospitalizations, surgeries, and ER visits in previous 12 months Vitals Screenings to include cognitive, depression, and falls Referrals and appointments  In addition, I have reviewed and discussed with patient certain preventive protocols, quality metrics, and best practice recommendations. A written personalized care plan for preventive services as well as general preventive health recommendations were provided to patient.     Sue Lush, LPN   1/61/0960   After Visit Summary: printed and given to pt  Nurse Notes: pt has concerns regarding a presence undr her rt armpit. She denies any pain there or fever. She relays it started when she had an episode of afib. Pt  also concerned about neuropathy in feet and legs that keep her from getting optimal sleep.  *Appt made with PCP for PE

## 2022-08-08 NOTE — Patient Instructions (Signed)
Kristie Walters , Thank you for taking time to come for your Medicare Wellness Visit. I appreciate your ongoing commitment to your health goals. Please review the following plan we discussed and let me know if I can assist you in the future.   These are the goals we discussed:  Goals       DIET - INCREASE WATER INTAKE      Recommend to drink at least 6-8 8oz glasses of water per day.      Exercise 3x per week (30 min per time)      Recommend walking 3 times per week for at least 30 minutes      Patient Stated (pt-stated)      She would like to see her son better from his cancer diagnosis.         This is a list of the screening recommended for you and due dates:  Health Maintenance  Topic Date Due   COVID-19 Vaccine (7 - 2023-24 season) 01/30/2022   Flu Shot  09/19/2022   Mammogram  09/26/2022   Screening for Lung Cancer  02/15/2023   Medicare Annual Wellness Visit  08/08/2023   DTaP/Tdap/Td vaccine (3 - Td or Tdap) 04/26/2031   Pneumonia Vaccine  Completed   DEXA scan (bone density measurement)  Completed   Hepatitis C Screening  Completed   HPV Vaccine  Aged Out   Colon Cancer Screening  Discontinued   Zoster (Shingles) Vaccine  Discontinued    Advanced directives: yes  Conditions/risks identified: low falls risk  Next appointment: Follow up in one year for your annual wellness visit 08/14/2023 @11 :15am in person   Preventive Care 65 Years and Older, Female Preventive care refers to lifestyle choices and visits with your health care provider that can promote health and wellness. What does preventive care include? A yearly physical exam. This is also called an annual well check. Dental exams once or twice a year. Routine eye exams. Ask your health care provider how often you should have your eyes checked. Personal lifestyle choices, including: Daily care of your teeth and gums. Regular physical activity. Eating a healthy diet. Avoiding tobacco and drug use. Limiting  alcohol use. Practicing safe sex. Taking low-dose aspirin every day. Taking vitamin and mineral supplements as recommended by your health care provider. What happens during an annual well check? The services and screenings done by your health care provider during your annual well check will depend on your age, overall health, lifestyle risk factors, and family history of disease. Counseling  Your health care provider may ask you questions about your: Alcohol use. Tobacco use. Drug use. Emotional well-being. Home and relationship well-being. Sexual activity. Eating habits. History of falls. Memory and ability to understand (cognition). Work and work Astronomer. Reproductive health. Screening  You may have the following tests or measurements: Height, weight, and BMI. Blood pressure. Lipid and cholesterol levels. These may be checked every 5 years, or more frequently if you are over 40 years old. Skin check. Lung cancer screening. You may have this screening every year starting at age 88 if you have a 30-pack-year history of smoking and currently smoke or have quit within the past 15 years. Fecal occult blood test (FOBT) of the stool. You may have this test every year starting at age 65. Flexible sigmoidoscopy or colonoscopy. You may have a sigmoidoscopy every 5 years or a colonoscopy every 10 years starting at age 76. Hepatitis C blood test. Hepatitis B blood test. Sexually transmitted disease (  STD) testing. Diabetes screening. This is done by checking your blood sugar (glucose) after you have not eaten for a while (fasting). You may have this done every 1-3 years. Bone density scan. This is done to screen for osteoporosis. You may have this done starting at age 71. Mammogram. This may be done every 1-2 years. Talk to your health care provider about how often you should have regular mammograms. Talk with your health care provider about your test results, treatment options, and if  necessary, the need for more tests. Vaccines  Your health care provider may recommend certain vaccines, such as: Influenza vaccine. This is recommended every year. Tetanus, diphtheria, and acellular pertussis (Tdap, Td) vaccine. You may need a Td booster every 10 years. Zoster vaccine. You may need this after age 42. Pneumococcal 13-valent conjugate (PCV13) vaccine. One dose is recommended after age 44. Pneumococcal polysaccharide (PPSV23) vaccine. One dose is recommended after age 7. Talk to your health care provider about which screenings and vaccines you need and how often you need them. This information is not intended to replace advice given to you by your health care provider. Make sure you discuss any questions you have with your health care provider. Document Released: 03/03/2015 Document Revised: 10/25/2015 Document Reviewed: 12/06/2014 Elsevier Interactive Patient Education  2017 [employer].  Fall Prevention in the Home Falls can cause injuries. They can happen to people of all ages. There are many things you can do to make your home safe and to help prevent falls. What can I do on the outside of my home? Regularly fix the edges of walkways and driveways and fix any cracks. Remove anything that might make you trip as you walk through a door, such as a raised step or threshold. Trim any bushes or trees on the path to your home. Use bright outdoor lighting. Clear any walking paths of anything that might make someone trip, such as rocks or tools. Regularly check to see if handrails are loose or broken. Make sure that both sides of any steps have handrails. Any raised decks and porches should have guardrails on the edges. Have any leaves, snow, or ice cleared regularly. Use sand or salt on walking paths during winter. Clean up any spills in your garage right away. This includes oil or grease spills. What can I do in the bathroom? Use night lights. Install grab bars by the toilet  and in the tub and shower. Do not use towel bars as grab bars. Use non-skid mats or decals in the tub or shower. If you need to sit down in the shower, use a plastic, non-slip stool. Keep the floor dry. Clean up any water that spills on the floor as soon as it happens. Remove soap buildup in the tub or shower regularly. Attach bath mats securely with double-sided non-slip rug tape. Do not have throw rugs and other things on the floor that can make you trip. What can I do in the bedroom? Use night lights. Make sure that you have a light by your bed that is easy to reach. Do not use any sheets or blankets that are too big for your bed. They should not hang down onto the floor. Have a firm chair that has side arms. You can use this for support while you get dressed. Do not have throw rugs and other things on the floor that can make you trip. What can I do in the kitchen? Clean up any spills right away. Avoid walking on  wet floors. Keep items that you use a lot in easy-to-reach places. If you need to reach something above you, use a strong step stool that has a grab bar. Keep electrical cords out of the way. Do not use floor polish or wax that makes floors slippery. If you must use wax, use non-skid floor wax. Do not have throw rugs and other things on the floor that can make you trip. What can I do with my stairs? Do not leave any items on the stairs. Make sure that there are handrails on both sides of the stairs and use them. Fix handrails that are broken or loose. Make sure that handrails are as long as the stairways. Check any carpeting to make sure that it is firmly attached to the stairs. Fix any carpet that is loose or worn. Avoid having throw rugs at the top or bottom of the stairs. If you do have throw rugs, attach them to the floor with carpet tape. Make sure that you have a light switch at the top of the stairs and the bottom of the stairs. If you do not have them, ask someone to add  them for you. What else can I do to help prevent falls? Wear shoes that: Do not have high heels. Have rubber bottoms. Are comfortable and fit you well. Are closed at the toe. Do not wear sandals. If you use a stepladder: Make sure that it is fully opened. Do not climb a closed stepladder. Make sure that both sides of the stepladder are locked into place. Ask someone to hold it for you, if possible. Clearly mark and make sure that you can see: Any grab bars or handrails. First and last steps. Where the edge of each step is. Use tools that help you move around (mobility aids) if they are needed. These include: Canes. Walkers. Scooters. Crutches. Turn on the lights when you go into a dark area. Replace any light bulbs as soon as they burn out. Set up your furniture so you have a clear path. Avoid moving your furniture around. If any of your floors are uneven, fix them. If there are any pets around you, be aware of where they are. Review your medicines with your doctor. Some medicines can make you feel dizzy. This can increase your chance of falling. Ask your doctor what other things that you can do to help prevent falls. This information is not intended to replace advice given to you by your health care provider. Make sure you discuss any questions you have with your health care provider. Document Released: 12/01/2008 Document Revised: 07/13/2015 Document Reviewed: 03/11/2014 Elsevier Interactive Patient Education  2017 [employer].

## 2022-08-08 NOTE — Telephone Encounter (Signed)
LVM for patient to call back to make an appt with either Dr. Carlynn Purl or another provider to address the concern she had regarding some type of presence under her arm that she discussed during her AWV today, 08/08/22, with the [facility].

## 2022-08-16 ENCOUNTER — Ambulatory Visit
Admission: RE | Admit: 2022-08-16 | Discharge: 2022-08-16 | Disposition: A | Discharge status: Home / Self Care | Payer: 59 | Source: Ambulatory Visit | Attending: Acute Care | Admitting: Acute Care

## 2022-08-16 DIAGNOSIS — R911 Solitary pulmonary nodule: Secondary | ICD-10-CM | POA: Diagnosis not present

## 2022-08-16 DIAGNOSIS — Z87891 Personal history of nicotine dependence: Secondary | ICD-10-CM | POA: Diagnosis not present

## 2022-08-19 ENCOUNTER — Other Ambulatory Visit: Payer: Self-pay | Admitting: Family Medicine

## 2022-09-06 ENCOUNTER — Other Ambulatory Visit: Payer: Self-pay | Admitting: Physician Assistant

## 2022-10-02 ENCOUNTER — Other Ambulatory Visit: Payer: Self-pay | Admitting: Physician Assistant

## 2022-10-11 ENCOUNTER — Telehealth: Payer: Self-pay | Admitting: Acute Care

## 2022-10-11 ENCOUNTER — Other Ambulatory Visit: Payer: Self-pay

## 2022-10-11 DIAGNOSIS — R911 Solitary pulmonary nodule: Secondary | ICD-10-CM

## 2022-10-11 DIAGNOSIS — Z87891 Personal history of nicotine dependence: Secondary | ICD-10-CM

## 2022-10-11 NOTE — Telephone Encounter (Signed)
Spoke with patient by phone, using two patient identifiers, to review results of LDCT.  Radiology cleared her for 12 months but pt will age out of screening program before then.  The scan was reviewed by Dr. Jayme Cloud and Saralyn Pilar, NP with following recommendation:  Patient's scan was read as a LR 2. She has a 12.8 mm nodule that was 10.2 mm in 2022, 2023.It has grown 2.7 mm in the last year.  Salena Saner, MD recommended 6 month F/U   Order placed for follow up CT.  Advised patient that she will age out of screening program before her yearly scan would be due and since nodule has grown, it is recommended to follow it once more in the interim of 6 months.  Patient agrees.  Atherosclerosis and emphysema, as previously noted.  Results/plan faxed to PCP.

## 2022-10-22 NOTE — Progress Notes (Signed)
Cardiology Office Note    Date:  10/23/2022   ID:  Kristie Walters, DOB 1945/12/19, MRN [id number]  PCP:  Kristie Cory, MD  Cardiologist:  Kristie Nordmann, MD  Electrophysiologist:  None   Chief Complaint: Follow-up  History of Present Illness:   Kristie Walters is a 77 y.o. 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   She was previously followed by Dr. Juliann Walters though subsequently transitioned to Dr. Mariah Walters in 09/2017.  Prior echo from 09/2015, done by outside cardiology group, showed an EF greater than 55%, mild LVH, normal RV systolic function, mild MR/TR.  Nuclear stress test at that time showed an EF of 77% with no evidence of significant ischemia or scar.  She was referred to [facility] in 09/2017 for evaluation of incidentally noted aortic atherosclerosis and coronary artery calcium along the LAD on noninvasive imaging.  She was noted to not be very active at baseline secondary to back pain.  She did note some rare palpitations associated with stress that improved with rest.  Aggressive primary prevention was recommended.  She declined stress testing at that time.   She was seen in the ED in 02/2019 with palpitations and noted to be in new onset A. fib with RVR with LBBB (EMS EKG).  She had spontaneously converted to sinus rhythm in the field following IV metoprolol.  She was noted to have a new left bundle which persisted in sinus rhythm.  High-sensitivity troponin negative x2.  TSH mildly  elevated at 5.041 with a potassium of 3.5.  She was placed on metoprolol and Eliquis.  Subsequent echo in 03/2019 showed an EF of 55 to 60%, no regional wall motion abnormalities, grade 2 diastolic dysfunction, normal RV systolic function and ventricular cavity size, normal size left atrium, and mildly elevated PASP.     She was seen on 01/05/2020 noting a 2-week history of increased palpitations as well as some associated dizziness, shortness of breath, and chest discomfort.  Lexiscan MPI on 01/11/2020 showed no evidence of significant ischemia or scar with a hyperdynamic LVEF greater than 65%.  With administration of regadenoson she developed a LBBB with known intermittent LBBB.  Attenuation corrected CT images noted aortic atherosclerosis with no significant coronary artery calcification.  Overall, this was a low risk study.  Zio patch showed a predominant rhythm of sinus with an average heart rate of 65 bpm.  First-degree AV block was noted.  18 episodes of SVT were noted with the longest interval lasting 10 beats with an average rate of 109 bpm and the fastest interval lasting 4 beats with a maximum rate of 158 bpm.  Isolated PACs, atrial couplets, atrial triplets, and PVCs were noted.  There were 6 patient triggered events and these were not associated with significant arrhythmia.  Echo in 02/2020 showed an EF of 55-60%, no RWMA, Gr1DD, normal RV systolic function and ventricular cavity size, and a PASP of 35.7 mmHg.   She was seen in the office in 5//2023 and was without symptoms of angina or decompensation.  She did report onset of left-sided palpitations and pinpoint chest discomfort that began after taking metoprolol titrate from a different manufacturer with symptoms typically lasting 1 to 2 minutes and spontaneously resolving.  With this, she self discontinued Lopressor and noted improvement, though not resolution of symptoms.  She was also under increased stress with the health of her son.  We  underwent a trial of Toprol-XL 25 mg daily.   She was seen in the office in 07/2021 with chronic stable exertional dyspnea.  Her main complaint at that time was fatigue that has been present for approximately 1 week.  Upon initiating metoprolol succinate, she noted resolution of palpitations.  Her son and daughter were doing well following stem cell donor/transplant.  She did not qualify for sleep study based on her STOP-BANG score.   She was seen in the office in 09/2021 noting chronic exertional dyspnea was somewhat improved.  Fatigue was resolved.  Stress level was improved.   She was seen in the ED on 10/24/2021 with A-fib with RVR with rates ranging from 90 to 170 bpm with associated shortness of breath generalized weakness, fatigue, and chills.  EKG showed A-fib with RVR, 106 bpm, and known LBBB.  High-sensitivity troponin of 13 with a delta troponin of 20.  Chest x-ray showed emphysema with no acute cardiopulmonary process.  She spontaneously converted to sinus rhythm in the ED.   She was seen in ED follow-up on 11/01/2021 and was without further chest discomfort or symptoms of decompensation.  She did note some brief paroxysms of palpitations.  She was maintaining sinus rhythm this.  Coronary CTA on 11/15/2021 demonstrated a calcium score of 170 which was the 67th percentile with 25 to 49% proximal LAD stenosis noted.  She was last seen in the office in 02/2022 noting labile hypertension with BP ranging from the low 100s to 170s systolic.  Since she was last seen she has undergone evaluation by GI for abdominal bloating with CT imaging being unrevealing.  She comes in today noting an increase in fatigue, SOB, palpitations, and abdominal bloating.  She reports 2 episodes of tachypalpitations since she was last seen, 1 of which she had to take as needed metoprolol.  These episodes have been associated with elevations in BP.  She has been without frank angina or symptoms of cardiac decompensation.  No  dizziness, presyncope, or syncope.  No lower extremity swelling, PND, orthopnea or weight gain.  Weight is down 2 pounds today by our scale when compared to her visit in 02/2022.  No falls, hematochezia, melena, hemoptysis, hematemesis, or hematuria.   Labs independently reviewed: 05/2022 - albumin 4.2, AST/ALT normal 10/2021 - Hgb 13.0, PLT 165, potassium 3.8, BUN 17, serum creatinine 0.97 07/2021 - TSH normal 04/2021 - TC 142, TG 194, HDL 34, LDL 79  Past Medical History:  Diagnosis Date   Allergy    Atrial fibrillation (HCC)    Basal cell carcinoma of nose 08/12/2017   Carotid artery disease (HCC)    s/p L. CEA in July 2016   Centrilobular emphysema (HCC) 09/29/2017   Collagen vascular disease (HCC)    Left carotid stenosis   COPD (chronic obstructive pulmonary disease) (HCC)    Coronary artery disease 09/29/2017   Noted on chest CT July 2019   Dysrhythmia    Elevated blood pressure (not hypertension)    Fatty liver 09/29/2017   Chest CT July 2019   GERD (gastroesophageal reflux disease)    Hyperlipidemia    Personal history  of tobacco use, presenting hazards to health 08/31/2015   PONV (postoperative nausea and vomiting)     Past Surgical History:  Procedure Laterality Date   BREAST BIOPSY     COLONOSCOPY WITH PROPOFOL N/A 08/22/2020   Procedure: COLONOSCOPY WITH PROPOFOL;  Surgeon: Midge Minium, MD;  Location: [facility] ENDOSCOPY;  Service: Endoscopy;  Laterality: N/A;   ENDARTERECTOMY Left 08/25/2014   Procedure: ENDARTERECTOMY CAROTID;  Surgeon: Annice Needy, MD;  Location: [facility] ORS;  Service: Vascular;  Laterality: Left;   MOHS SURGERY     MOHS SURGERY  09/23/2017   nose   PERIPHERAL VASCULAR CATHETERIZATION N/A 07/20/2014   Procedure: Carotid Angiography;  Surgeon: Annice Needy, MD;  Location: [facility] INVASIVE CV LAB;  Service: Cardiovascular;  Laterality: N/A;   TONSILLECTOMY     TUBAL LIGATION      Current Medications: Current Meds  Medication Sig   albuterol (VENTOLIN HFA)  108 (90 Base) MCG/ACT inhaler Inhale 2 puffs into the lungs every 6 (six) hours as needed for wheezing or shortness of breath.   aspirin 81 MG tablet Take 81 mg by mouth daily.   calcium carbonate (OS-CAL - DOSED IN MG OF ELEMENTAL CALCIUM) 1250 (500 Ca) MG tablet Take 1 tablet by mouth.   cholecalciferol (VITAMIN D) 1000 units tablet Take 1,000 Units by mouth daily.   Cyanocobalamin (VITAMIN B-12) 5000 MCG SUBL Place under the tongue.   ezetimibe (ZETIA) 10 MG tablet TAKE 1 TABLET BY MOUTH EVERY DAY   Fish Oil-Cholecalciferol (FISH OIL + D3 PO) Take 1,000 mg by mouth 1 day or 1 dose.   Fluticasone-Umeclidin-Vilant (TRELEGY ELLIPTA) 100-62.5-25 MCG/ACT AEPB INHALE 1 PUFF BY MOUTH EVERY DAY   omeprazole (PRILOSEC) 40 MG capsule TAKE 1 CAPSULE (40 MG TOTAL) BY MOUTH AS NEEDED   pregabalin (LYRICA) 50 MG capsule Take 1 capsule (50 mg total) by mouth 3 (three) times daily.   [DISCONTINUED] amLODipine (NORVASC) 5 MG tablet Take 1 tablet (5 mg total) by mouth as needed (AS NEEDED for SBP (top blood pressure number) greater than 140).   [DISCONTINUED] atorvastatin (LIPITOR) 40 MG tablet TAKE 1 TABLET BY MOUTH EVERY DAY   [DISCONTINUED] ELIQUIS 5 MG TABS tablet TAKE 1 TABLET BY MOUTH TWICE A DAY   [DISCONTINUED] metoprolol succinate (TOPROL-XL) 25 MG 24 hr tablet Take 1 tablet (25 mg total) by mouth daily.   [DISCONTINUED] metoprolol tartrate (LOPRESSOR) 25 MG tablet Take 1 tablet (25 mg total) by mouth 2 (two) times daily as needed (As needed for palpitations).    Allergies:   Morphine, Morphine and codeine, and Zoster vaccine live   Social History   Socioeconomic History   Marital status: Divorced    Spouse name: Not on file   Number of children: 2   Years of education: Not on file   Highest education level: 10th grade  Occupational History   Occupation: Retired  Tobacco Use   Smoking status: Former    Current packs/day: 0.00    Average packs/day: 1 pack/day for 55.0 years (55.0 ttl pk-yrs)     Types: Cigarettes    Start date: 11/26/1961    Quit date: 11/26/2016    Years since quitting: 5.9    Passive exposure: Past   Smokeless tobacco: Former    Types: Snuff   Tobacco comments:    smoking cessation materials not required  Vaping Use   Vaping status: Never Used  Substance and Sexual Activity   Alcohol use: No   Drug use: No  Sexual activity: Not Currently  Other Topics Concern   Not on file  Social History Narrative    Pt lives alone   Social Determinants of Health   Financial Resource Strain: Low Risk  (08/08/2022)   Overall Financial Resource Strain (CARDIA)    Difficulty of Paying Living Expenses: Not hard at all  Food Insecurity: No Food Insecurity (08/08/2022)   Hunger Vital Sign    Worried About Running Out of Food in the Last Year: Never true    Ran Out of Food in the Last Year: Never true  Transportation Needs: No Transportation Needs (08/08/2022)   PRAPARE - Administrator, Civil Service (Medical): No    Lack of Transportation (Non-Medical): No  Physical Activity: Inactive (08/08/2022)   Exercise Vital Sign    Days of Exercise per Week: 0 days    Minutes of Exercise per Session: 0 min  Stress: Stress Concern Present (08/08/2022)   [employer] of Occupational Health - Occupational Stress Questionnaire    Feeling of Stress : To some extent  Social Connections: Moderately Isolated (08/08/2022)   Social Connection and Isolation Panel [NHANES]    Frequency of Communication with Friends and Family: More than three times a week    Frequency of Social Gatherings with Friends and Family: Three times a week    Attends Religious Services: More than 4 times per year    Active Member of Clubs or Organizations: No    Attends Banker Meetings: Never    Marital Status: Divorced     Family History:  The patient's family history includes Alzheimer's disease in her brother; CVA in her father; Dementia in her mother; Diabetes in her brother  and brother; HIV in her son; Heart attack in her mother; Hypercholesterolemia in her mother; Hypertension in her mother; Hypothyroidism in her mother; Kidney cancer in her sister; Liver cancer in her father; Lymphoma in her son; Other in her brother; Peripheral vascular disease in her mother. There is no history of Breast cancer.  ROS:   12-point review of systems is negative unless otherwise noted in the HPI.   EKGs/Labs/Other Studies Reviewed:    Studies reviewed were summarized above. The additional studies were reviewed today:  Coronary CTA 11/15/2021: FINDINGS: Aorta: Normal size. Aortic root and descending aorta calcifications. No dissection.   Aortic Valve:  Trileaflet.  No calcifications.   Coronary Arteries:  Normal coronary origin.  Right dominance.   RCA is a dominant artery that gives rise to PDA and PLA. There is no plaque.   Left main gives rise to LAD and LCX arteries. There is no LM disease.   LAD has calcified plaque in the proximal segment causing mild stenosis (25-49%).   LCX is a non-dominant artery that gives rise to three obtuse marginal branches. There is no plaque.   Other findings:   Normal pulmonary vein drainage into the left atrium.   Normal left atrial appendage without a thrombus.   Normal size of the pulmonary artery.   IMPRESSION: 1. Coronary calcium score of 170. This was 67th percentile for age and sex matched control. 2. Normal coronary origin with right dominance. 3. Calcified plaque in the proximal LAD causing mild stenosis (25-49%) 4. CAD-RADS 2. Mild non-obstructive CAD (25-49%). Consider non-atherosclerotic causes of chest pain. Consider preventive therapy and risk factor modification. __________   2D echo 09/04/2021: 1. Left ventricular ejection fraction, by estimation, is 55 to 60%. The  left ventricle has normal function. The left  ventricle has no regional  wall motion abnormalities. Left ventricular diastolic parameters  are  consistent with Grade I diastolic  dysfunction (impaired relaxation).   2. Right ventricular systolic function is normal. The right ventricular  size is normal. Tricuspid regurgitation signal is inadequate for assessing  PA pressure.   3. The mitral valve is normal in structure. No evidence of mitral valve  regurgitation. No evidence of mitral stenosis.   4. The aortic valve was not well visualized. Aortic valve regurgitation  is not visualized. No aortic stenosis is present.   5. The inferior vena cava is normal in size with greater than 50%  respiratory variability, suggesting right atrial pressure of 3 mmHg.   Comparison(s): 02/2020-EF 55-60%. __________   Carotid artery ultrasound 07/20/2021: Summary:  Right Carotid: Velocities in the right ICA are consistent with a 1-39% stenosis.   Left Carotid: Velocities in the left ICA are consistent with a 1-39%  stenosis.   Vertebrals:  Bilateral vertebral arteries demonstrate antegrade flow.  Subclavians: Normal flow hemodynamics were seen in bilateral subclavian arteries. ___________   2D echo 03/09/2020: 1. Left ventricular ejection fraction, by estimation, is 55 to 60%. The  left ventricle has normal function. The left ventricle has no regional  wall motion abnormalities. Left ventricular diastolic parameters are  consistent with Grade I diastolic  dysfunction (impaired relaxation).   2. Right ventricular systolic function is normal. The right ventricular  size is normal. There is normal pulmonary artery systolic pressure. The  estimated right ventricular systolic pressure is 35.7 mmHg.   3. The aortic valve was not well visualized. __________   Eugenie Birks MPI 01/11/2020: Normal pharmacologic myocardial perfusion stress test without evidence of significant ischemia or scar. The left ventricular ejection fraction is hyperdynamic (>65%). Left bundle branch block developed after administration of regadenoson. Patient known to have  intermittent LBBB. Attenuation correction CT is notable for aortic atherosclerosis. There is no significant coronary artery calcification. This is a low risk study. __________   Luci Bank patch 12/2019: Normal sinus rhythm avg HR of 65 bpm.    Patient triggered events (6) were not associated with significant arrhythmia.   First Degree AV Block was present.  18 Supraventricular Tachycardia runs occurred, the run with the fastest interval lasting 4 beats with a max rate of 158 bpm, the longest lasting 10 beats with an avg rate of 109 bpm.   Isolated SVEs were rare (<1.0%), SVE Couplets were rare (<1.0%), and SVE Triplets were rare (<1.0%). Isolated VEs were rare (<1.0%), and no VE Couplets or VE Triplets were present. __________   2D echo 03/2019: 1. Left ventricular ejection fraction, by visual estimation, is 55 to  60%. The left ventricle has normal function. There is no left ventricular  hypertrophy.   2. Left ventricular diastolic parameters are consistent with Grade II  diastolic dysfunction (pseudonormalization).   3. The left ventricle has no regional wall motion abnormalities.   4. Global right ventricle has normal systolic function.The right  ventricular size is normal. No increase in right ventricular wall  thickness.   5. Left atrial size was normal.   6. Mildly elevated pulmonary artery systolic pressure.   EKG:  EKG is ordered today.  The EKG ordered today demonstrates NSR, 65 bpm, no acute st/t changes, baseline artifact  Recent Labs: 10/24/2021: BUN 17; Hemoglobin 13.0; Platelets 165; Potassium 3.8; Sodium 140 06/18/2022: ALT 28 06/26/2022: Creatinine, Ser 1.10  Recent Lipid Panel    Component Value Date/Time   CHOL 142 04/18/2021  1423   CHOL 155 05/08/2015 0927   TRIG 194 (H) 04/18/2021 1423   HDL 34 (L) 04/18/2021 1423   HDL 30 (L) 05/08/2015 0927   CHOLHDL 4.2 04/18/2021 1423   LDLCALC 79 04/18/2021 1423    PHYSICAL EXAM:    VS:  BP 132/72 (BP Location: Left  Arm, Patient Position: Sitting, Cuff Size: Normal)   Pulse 65   Ht 5\' 11"  (1.803 m)   Wt 167 lb 12.8 oz (76.1 kg)   SpO2 95%   BMI 23.40 kg/m   BMI: Body mass index is 23.4 kg/m.  Physical Exam Vitals reviewed.  Constitutional:      Appearance: She is well-developed.  HENT:     Head: Normocephalic and atraumatic.  Eyes:     General:        Right eye: No discharge.        Left eye: No discharge.  Neck:     Vascular: No JVD.  Cardiovascular:     Rate and Rhythm: Normal rate and regular rhythm.     Heart sounds: Normal heart sounds, S1 normal and S2 normal. Heart sounds not distant. No midsystolic click and no opening snap. No murmur heard.    No friction rub.  Pulmonary:     Effort: Pulmonary effort is normal. No respiratory distress.     Breath sounds: Normal breath sounds. No decreased breath sounds, wheezing or rales.  Chest:     Chest wall: No tenderness.  Abdominal:     General: There is no distension.  Musculoskeletal:     Cervical back: Normal range of motion.     Right lower leg: No edema.     Left lower leg: No edema.  Skin:    General: Skin is warm and dry.     Nails: There is no clubbing.  Neurological:     Mental Status: She is alert and oriented to person, place, and time.  Psychiatric:        Speech: Speech normal.        Behavior: Behavior normal.        Thought Content: Thought content normal.        Judgment: Judgment normal.     Wt Readings from Last 3 Encounters:  10/23/22 167 lb 12.8 oz (76.1 kg)  08/08/22 171 lb 9.6 oz (77.8 kg)  07/31/22 170 lb 12.8 oz (77.5 kg)     ASSESSMENT & PLAN:   CAD involving native coronary arteries without angina: She is without symptoms concerning for angina or cardiac decompensation.  Coronary CTA less than 12 months ago showed nonobstructive CAD as outlined above.  Continue aggressive risk factor modification and current medical therapy including aspirin, atorvastatin, ezetimibe, and Toprol-XL.  PAF/PSVT:  Maintaining sinus rhythm on Toprol-XL with as needed Lopressor.  With noted increase in palpitation burden, placed Zio patch to evaluate for breakthrough arrhythmia.  CHA2DS2-VASc at least 5 (HTN, age x 2, vascular disease, sex category).  She remains on apixaban 5 mg twice daily and does not meet reduced dosing criteria.  Check CBC, TSH, electrolytes, and renal function.  Diastolic dysfunction/dyspnea: Will obtain echo to evaluate for new cardiomyopathy or evidence of volume overload/RV dysfunction given abdominal bloating.  Not requiring a standing loop diuretic.  Intermittent LBBB: No evidence of bundle branch block on EKG today.  Does not appear to correlate with symptoms of fatigue.  Obtain echo.  HTN: Blood pressure is reasonably controlled in the office today.  Continue amlodipine and Toprol-XL.  HLD:  LDL 79 in 04/2021.  Obtain CMP and lipid panel.  She remains on atorvastatin.  Carotid artery disease: Status post left-sided CEA in 08/2014.  Stable 1 to 39% bilateral ICA stenoses by ultrasound in 07/2022.  She remains on aspirin, atorvastatin, and ezetimibe.  Followed by vascular surgery.  Fatigue: Overall, vague symptomology.  Check echo to evaluate for new cardiomyopathy.  Otherwise, obtain CBC, CMP and TSH.  No further cardiac testing planned at this time unless echo dictates.    Disposition: F/u with Dr. Mariah Walters or an APP in 2 months.   Medication Adjustments/Labs and Tests Ordered: Current medicines are reviewed at length with the patient today.  Concerns regarding medicines are outlined above. Medication changes, Labs and Tests ordered today are summarized above and listed in the Patient Instructions accessible in Encounters.   Signed, Eula Listen, PA-C 10/23/2022 12:54 PM     [facility] [place] [address] [place], [place] [address] [phone]

## 2022-10-23 ENCOUNTER — Ambulatory Visit: Payer: 59 | Attending: Physician Assistant | Admitting: Physician Assistant

## 2022-10-23 ENCOUNTER — Ambulatory Visit: Payer: 59

## 2022-10-23 ENCOUNTER — Telehealth: Payer: Self-pay | Admitting: Emergency Medicine

## 2022-10-23 ENCOUNTER — Encounter: Payer: Self-pay | Admitting: Physician Assistant

## 2022-10-23 VITALS — BP 132/72 | HR 65 | Ht 71.0 in | Wt 167.8 lb

## 2022-10-23 DIAGNOSIS — I6523 Occlusion and stenosis of bilateral carotid arteries: Secondary | ICD-10-CM | POA: Diagnosis not present

## 2022-10-23 DIAGNOSIS — I2510 Atherosclerotic heart disease of native coronary artery without angina pectoris: Secondary | ICD-10-CM

## 2022-10-23 DIAGNOSIS — I5189 Other ill-defined heart diseases: Secondary | ICD-10-CM

## 2022-10-23 DIAGNOSIS — E785 Hyperlipidemia, unspecified: Secondary | ICD-10-CM

## 2022-10-23 DIAGNOSIS — R002 Palpitations: Secondary | ICD-10-CM

## 2022-10-23 DIAGNOSIS — I10 Essential (primary) hypertension: Secondary | ICD-10-CM | POA: Diagnosis not present

## 2022-10-23 DIAGNOSIS — R0602 Shortness of breath: Secondary | ICD-10-CM | POA: Diagnosis not present

## 2022-10-23 DIAGNOSIS — R5383 Other fatigue: Secondary | ICD-10-CM

## 2022-10-23 DIAGNOSIS — I471 Supraventricular tachycardia, unspecified: Secondary | ICD-10-CM | POA: Diagnosis not present

## 2022-10-23 DIAGNOSIS — I447 Left bundle-branch block, unspecified: Secondary | ICD-10-CM | POA: Diagnosis not present

## 2022-10-23 DIAGNOSIS — I480 Paroxysmal atrial fibrillation: Secondary | ICD-10-CM | POA: Diagnosis not present

## 2022-10-23 MED ORDER — METOPROLOL SUCCINATE ER 25 MG PO TB24: 25.0000 mg | ORAL_TABLET | Freq: Every day | ORAL | 3 refills | Status: DC

## 2022-10-23 MED ORDER — APIXABAN 5 MG PO TABS: 5.0000 mg | ORAL_TABLET | Freq: Two times a day (BID) | ORAL | 3 refills | Status: DC

## 2022-10-23 MED ORDER — AMLODIPINE BESYLATE 5 MG PO TABS: 5.0000 mg | ORAL_TABLET | ORAL | 3 refills | Status: DC | PRN

## 2022-10-23 MED ORDER — METOPROLOL TARTRATE 25 MG PO TABS: 25.0000 mg | ORAL_TABLET | Freq: Two times a day (BID) | ORAL | 3 refills | Status: DC | PRN

## 2022-10-23 MED ORDER — ATORVASTATIN CALCIUM 40 MG PO TABS: 40.0000 mg | ORAL_TABLET | Freq: Every day | ORAL | 3 refills | Status: DC

## 2022-10-23 NOTE — Patient Instructions (Signed)
Medication Instructions:  Your Physician recommend you continue on your current medication as directed.    *If you need a refill on your cardiac medications before your next appointment, please call your pharmacy*   Lab Work: Your provider would like for you to have following labs drawn today CBC, CMET, Lipid Panel, TSH.   If you have labs (blood work) drawn today and your tests are completely normal, you will receive your results only by: MyChart Message (if you have MyChart) OR A paper copy in the mail If you have any lab test that is abnormal or we need to change your treatment, we will call you to review the results.   Testing/Procedures: Christena Deem- Long Term Monitor Instructions  Your physician has requested you wear a ZIO patch monitor for 14 days.  This is a single patch monitor. [employer] supplies one patch monitor per enrollment. Additional stickers are not available. Please do not apply patch if you will be having a Nuclear Stress Test,  Echocardiogram, Cardiac CT, MRI, or Chest Xray during the period you would be wearing the  monitor. The patch cannot be worn during these tests. You cannot remove and re-apply the  ZIO XT patch monitor.  Your ZIO patch monitor will be mailed 3 day USPS to your address on file. It may take 3-5 days  to receive your monitor after you have been enrolled.  Once you have received your monitor, please review the enclosed instructions. Your monitor  has already been registered assigning a specific monitor serial # to you.  Billing and Patient Assistance Program Information  We have supplied [employer] with any of your insurance information on file for billing purposes. [employer] offers a sliding scale Patient Assistance Program for patients that do not have  insurance, or whose insurance does not completely cover the cost of the ZIO monitor.  You must apply for the Patient Assistance Program to qualify for this discounted rate.  To apply, please call  [employer] at [phone], select option 4, select option 2, ask to apply for  Patient Assistance Program. Meredeth Ide will ask your household income, and how many people  are in your household. They will quote your out-of-pocket cost based on that information.  [employer] will also be able to set up a 33-month, interest-free payment plan if needed.  Applying the monitor   Shave hair from upper left chest.  Hold abrader disc by orange tab. Rub abrader in 40 strokes over the upper left chest as  indicated in your monitor instructions.  Clean area with 4 enclosed alcohol pads. Let dry.  Apply patch as indicated in monitor instructions. Patch will be placed under collarbone on left  side of chest with arrow pointing upward.  Rub patch adhesive wings for 2 minutes. Remove white label marked "1". Remove the white  label marked "2". Rub patch adhesive wings for 2 additional minutes.  While looking in a mirror, press and release button in center of patch. A small green light will  flash 3-4 times. This will be your only indicator that the monitor has been turned on.  Do not shower for the first 24 hours. You may shower after the first 24 hours.  Press the button if you feel a symptom. You will hear a small click. Record Date, Time and  Symptom in the Patient Logbook.  When you are ready to remove the patch, follow instructions on the last 2 pages of Patient  Logbook. Stick patch monitor onto the last page  of Patient Logbook.  Place Patient Logbook in the blue and white box. Use locking tab on box and tape box closed  securely. The blue and white box has prepaid postage on it. Please place it in the mailbox as  soon as possible. Your physician should have your test results approximately 7 days after the  monitor has been mailed back to [facility].  Call [facility] Customer Care at [phone] if you have questions regarding  your ZIO XT patch monitor. Call them immediately if you see an orange  light blinking on your  monitor.  If your monitor falls off in less than 4 days, contact our Monitor department at [phone].  If your monitor becomes loose or falls off after 4 days call [employer] at [phone] for  suggestions on securing your monitor   Your physician has requested that you have an echocardiogram. Echocardiography is a painless test that uses sound waves to create images of your heart. It provides your doctor with information about the size and shape of your heart and how well your heart's chambers and valves are working. This procedure takes approximately one hour. There are no restrictions for this procedure. Please do NOT wear cologne, perfume, aftershave, or lotions (deodorant is allowed). Please arrive 15 minutes prior to your appointment time.  You may receive an ultrasound enhancing agent through an IV if needed to better visualize your heart during the echo. This will take place at [address] [address] [facility], [place] [address]   Follow-Up: At [facility], you and your health needs are our priority.  As part of our continuing mission to provide you with exceptional heart care, we have created designated Provider Care Teams.  These Care Teams include your primary Cardiologist (physician) and Advanced Practice Providers (APPs -  Physician Assistants and Nurse Practitioners) who all work together to provide you with the care you need, when you need it.  We recommend signing up for the patient portal called "MyChart".  Sign up information is provided on this After Visit Summary.  MyChart is used to connect with patients for Virtual Visits (Telemedicine).  Patients are able to view lab/test results, encounter notes, upcoming appointments, etc.  Non-urgent messages can be sent to your provider as well.   To learn more about what you can do with MyChart, go to [email or web address].    Your next appointment:   2 month(s)  Provider:    You may see Julien Nordmann, MD or one of the following Advanced Practice Providers on your designated Care Team:   Eula Listen, [place]

## 2022-10-23 NOTE — Progress Notes (Unsigned)
Name: Kristie Walters   MRN: [id number]    DOB: Jan 10, 1946   Date:10/24/2022       Progress Note  Subjective  Chief Complaint  Annual Exam  HPI  Patient presents for annual CPE.  Diet: balanced  Exercise:  not able to do much due to sob, seeing cardiologist  Last Eye Exam: she is due for an exam  Last Dental Exam: dentures   Flowsheet Row Clinical Support from 08/08/2022 in [facility]  AUDIT-C Score 0      Depression: Phq 9 is  negative    10/24/2022    9:13 AM 08/08/2022   10:46 AM 12/12/2021   10:26 AM 08/15/2021   10:08 AM 08/09/2021    9:23 AM  Depression screen PHQ 2/9  Decreased Interest 0 0 0 0 0  Down, Depressed, Hopeless 0 0 1 0 0  PHQ - 2 Score 0 0 1 0 0  Altered sleeping 0  1    Tired, decreased energy 0  0    Change in appetite 0  0    Feeling bad or failure about yourself  0  0    Trouble concentrating 0  0    Moving slowly or fidgety/restless 0  0    Suicidal thoughts 0  0    PHQ-9 Score 0  2    Difficult doing work/chores   Not difficult at all     Hypertension: BP Readings from Last 3 Encounters:  10/24/22 126/64  10/23/22 132/72  08/08/22 120/60   Obesity: Wt Readings from Last 3 Encounters:  10/24/22 167 lb (75.8 kg)  10/23/22 167 lb 12.8 oz (76.1 kg)  08/08/22 171 lb 9.6 oz (77.8 kg)   BMI Readings from Last 3 Encounters:  10/24/22 23.29 kg/m  10/23/22 23.40 kg/m  08/08/22 23.93 kg/m     Vaccines:   RSV: discussed with patient  Tdap:  up to date Shingrix: 1 of 2 Pneumonia: up to date Flu: 2023, she will get it today  COVID-19: she will get booster at local pharmacy    Hep C Screening: 11/18/16 STD testing and prevention (HIV/chl/gon/syphilis): N/A Intimate partner violence: negative screen  Sexual History : not sexually active  Menstrual History/LMP/Abnormal Bleeding: s/p menopause  Discussed importance of follow up if any post-menopausal bleeding: yes  Incontinence Symptoms: negative for symptoms    Breast cancer:  - Last Mammogram: 09/25/21, due- ordered today - BRCA gene screening: N/A  Osteoporosis Prevention : Discussed high calcium and vitamin D supplementation, weight bearing exercises Bone density: 02/27/22 - osteoporosis , she was seen by Endo and had Reclast but did not follow up due to bone pain   Cervical cancer screening: N/A  Skin cancer: Discussed monitoring for atypical lesions  Colorectal cancer: 08/22/20   Lung cancer:  Low Dose CT Chest recommended if Age 65-80 years, 20 pack-year currently smoking OR have quit w/in 15years. Patient does not qualify for screen   ECG: 10/23/22  Advanced Care Planning: A voluntary discussion about advance care planning including the explanation and discussion of advance directives.  Discussed health care proxy and Living will, and the patient was able to identify a health care proxy as daughter .  Patient does not have a living will and power of attorney of health care   Lipids: Lab Results  Component Value Date   CHOL 120 10/23/2022   CHOL 142 04/18/2021   CHOL 140 06/30/2020   Lab Results  Component Value Date  HDL 34 (L) 10/23/2022   HDL 34 (L) 04/18/2021   HDL 33 (L) 06/30/2020   Lab Results  Component Value Date   LDLCALC 53 10/23/2022   LDLCALC 79 04/18/2021   LDLCALC 75 06/30/2020   Lab Results  Component Value Date   TRIG 199 (H) 10/23/2022   TRIG 194 (H) 04/18/2021   TRIG 218 (H) 06/30/2020   Lab Results  Component Value Date   CHOLHDL 3.5 10/23/2022   CHOLHDL 4.2 04/18/2021   CHOLHDL 4.2 06/30/2020   No results found for: "LDLDIRECT"  Glucose: Glucose  Date Value Ref Range Status  10/23/2022 85 70 - 99 mg/dL Final  24/40/1027 92 65 - 99 mg/dL Final  25/36/6440 347 (H) 65 - 99 mg/dL Final   Glucose, Bld  Date Value Ref Range Status  10/24/2021 203 (H) 70 - 99 mg/dL Final    Comment:    Glucose reference range applies only to samples taken after fasting for at least 8 hours.  08/03/2021 95 70 -  99 mg/dL Final    Comment:    Glucose reference range applies only to samples taken after fasting for at least 8 hours.  04/18/2021 80 65 - 99 mg/dL Final    Comment:    .            Fasting reference interval .     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   Fatty liver 09/29/2017   Coronary artery disease 09/29/2017   Centrilobular emphysema (HCC) 09/29/2017   Basal cell carcinoma of nose 08/12/2017   Estrogen deficiency 08/01/2017   Aortic atherosclerosis (HCC) 09/14/2015   Personal history of tobacco use, presenting hazards to health 08/31/2015   Paresthesia of foot, bilateral 08/15/2015   Breast mass, right 07/12/2015   Elevated alkaline phosphatase level 05/29/2015   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   Gastroesophageal reflux disease without esophagitis 04/26/2015   Hyperlipidemia 04/26/2015   Hyperglycemia 04/26/2015   Carotid stenosis 08/25/2014   H/O malignant neoplasm of skin 08/13/2012    Past Surgical History:  Procedure Laterality Date   BREAST BIOPSY     COLONOSCOPY WITH PROPOFOL N/A 08/22/2020   Procedure: COLONOSCOPY WITH PROPOFOL;  Surgeon: Midge Minium, MD;  Location: [facility];  Service: Endoscopy;  Laterality: N/A;   ENDARTERECTOMY Left 08/25/2014   Procedure: ENDARTERECTOMY CAROTID;  Surgeon: Annice Needy, MD;  Location: [facility] ORS;  Service: Vascular;  Laterality: Left;   MOHS SURGERY     MOHS SURGERY  09/23/2017   nose   PERIPHERAL VASCULAR CATHETERIZATION N/A  07/20/2014   Procedure: Carotid Angiography;  Surgeon: Annice Needy, MD;  Location: [facility] INVASIVE CV LAB;  Service: Cardiovascular;  Laterality: N/A;   TONSILLECTOMY     TUBAL LIGATION      Family History  Problem Relation Age of Onset   Heart attack Mother    Hypercholesterolemia Mother    Hypertension Mother    Peripheral vascular disease Mother    Dementia Mother    Hypothyroidism Mother  CVA Father    Liver cancer Father    Diabetes Brother    Kidney cancer Sister    Diabetes Brother    Alzheimer's disease Brother    Other Brother        alzheimers   Lymphoma Son    HIV Son    Breast cancer Neg Hx     Social History   Socioeconomic History   Marital status: Divorced    Spouse name: Not on file   Number of children: 2   Years of education: Not on file   Highest education level: 10th grade  Occupational History   Occupation: Retired  Tobacco Use   Smoking status: Former    Current packs/day: 0.00    Average packs/day: 1 pack/day for 55.0 years (55.0 ttl pk-yrs)    Types: Cigarettes    Start date: 11/26/1961    Quit date: 11/26/2016    Years since quitting: 5.9    Passive exposure: Past   Smokeless tobacco: Former    Types: Snuff   Tobacco comments:    smoking cessation materials not required  Vaping Use   Vaping status: Never Used  Substance and Sexual Activity   Alcohol use: No   Drug use: No   Sexual activity: Not Currently  Other Topics Concern   Not on file  Social History Narrative    Pt lives alone   Social Determinants of Health   Financial Resource Strain: Low Risk  (08/08/2022)   Overall Financial Resource Strain (CARDIA)    Difficulty of Paying Living Expenses: Not hard at all  Food Insecurity: No Food Insecurity (08/08/2022)   Hunger Vital Sign    Worried About Running Out of Food in the Last Year: Never true    Ran Out of Food in the Last Year: Never true  Transportation Needs: No Transportation Needs (08/08/2022)   PRAPARE -  Administrator, Civil Service (Medical): No    Lack of Transportation (Non-Medical): No  Physical Activity: Inactive (08/08/2022)   Exercise Vital Sign    Days of Exercise per Week: 0 days    Minutes of Exercise per Session: 0 min  Stress: Stress Concern Present (08/08/2022)   [employer] of Occupational Health - Occupational Stress Questionnaire    Feeling of Stress : To some extent  Social Connections: Moderately Isolated (08/08/2022)   Social Connection and Isolation Panel [NHANES]    Frequency of Communication with Friends and Family: More than three times a week    Frequency of Social Gatherings with Friends and Family: Three times a week    Attends Religious Services: More than 4 times per year    Active Member of Clubs or Organizations: No    Attends Banker Meetings: Never    Marital Status: Divorced  Catering manager Violence: Not At Risk (08/08/2022)   Humiliation, Afraid, Rape, and Kick questionnaire    Fear of Current or Ex-Partner: No    Emotionally Abused: No    Physically Abused: No    Sexually Abused: No     Current Outpatient Medications:    albuterol (VENTOLIN HFA) 108 (90 Base) MCG/ACT inhaler, Inhale 2 puffs into the lungs every 6 (six) hours as needed for wheezing or shortness of breath., Disp: 8 g, Rfl: 2   amLODipine (NORVASC) 5 MG tablet, Take 1 tablet (5 mg total) by mouth as needed (AS NEEDED for SBP (top blood pressure number) greater than 140)., Disp: 90 tablet, Rfl: 3   apixaban (  ELIQUIS) 5 MG TABS tablet, Take 1 tablet (5 mg total) by mouth 2 (two) times daily., Disp: 180 tablet, Rfl: 3   aspirin 81 MG tablet, Take 81 mg by mouth daily., Disp: , Rfl:    atorvastatin (LIPITOR) 40 MG tablet, Take 1 tablet (40 mg total) by mouth daily., Disp: 30 tablet, Rfl: 3   calcium carbonate (OS-CAL - DOSED IN MG OF ELEMENTAL CALCIUM) 1250 (500 Ca) MG tablet, Take 1 tablet by mouth., Disp: , Rfl:    cholecalciferol (VITAMIN D) 1000 units  tablet, Take 1,000 Units by mouth daily., Disp: , Rfl:    Cyanocobalamin (VITAMIN B-12) 5000 MCG SUBL, Place under the tongue., Disp: , Rfl:    ezetimibe (ZETIA) 10 MG tablet, TAKE 1 TABLET BY MOUTH EVERY DAY, Disp: 90 tablet, Rfl: 2   Fish Oil-Cholecalciferol (FISH OIL + D3 PO), Take 1,000 mg by mouth 1 day or 1 dose., Disp: , Rfl:    Fluticasone-Umeclidin-Vilant (TRELEGY ELLIPTA) 100-62.5-25 MCG/ACT AEPB, INHALE 1 PUFF BY MOUTH EVERY DAY, Disp: 60 each, Rfl: 6   metoprolol succinate (TOPROL-XL) 25 MG 24 hr tablet, Take 1 tablet (25 mg total) by mouth daily., Disp: 90 tablet, Rfl: 3   metoprolol tartrate (LOPRESSOR) 25 MG tablet, Take 1 tablet (25 mg total) by mouth 2 (two) times daily as needed (As needed for palpitations)., Disp: 180 tablet, Rfl: 3   omeprazole (PRILOSEC) 40 MG capsule, TAKE 1 CAPSULE (40 MG TOTAL) BY MOUTH AS NEEDED, Disp: 90 capsule, Rfl: 0   pregabalin (LYRICA) 50 MG capsule, Take 1 capsule (50 mg total) by mouth 3 (three) times daily., Disp: 270 capsule, Rfl: 1   fluticasone (FLONASE) 50 MCG/ACT nasal spray, Place 2 sprays into both nostrils daily. (Patient not taking: Reported on 10/23/2022), Disp: 16 g, Rfl: 6  Allergies  Allergen Reactions   Morphine Nausea Only and Nausea And Vomiting   Morphine And Codeine Nausea And Vomiting   Zoster Vaccine Live Itching and Rash     ROS  Constitutional: Negative for fever, positive for mild weight change.  Respiratory: Negative for cough, positive for chronic  shortness of breath.   Cardiovascular: Negative for chest pain , positive for intermittent palpitations.  Gastrointestinal: Negative for abdominal pain, no bowel changes.  Musculoskeletal: Negative for gait problem or joint swelling.  Skin: Negative for rash.  Gait: problems with balance, staggering but no dizziness and denies falls  Neurological: Negative for dizziness or headache.  No other specific complaints in a complete review of systems (except as listed in HPI  above).   Objective  Vitals:   10/24/22 0914  BP: 126/64  Pulse: 84  Resp: 16  SpO2: 96%  Weight: 167 lb (75.8 kg)  Height: 5\' 11"  (1.803 m)    Body mass index is 23.29 kg/m.  Physical Exam  Constitutional: Patient appears well-developed and well-nourished. No distress.  HENT: Head: Normocephalic and atraumatic. Ears: B TMs ok, no erythema or effusion; Nose: Nose normal. Mouth/Throat: Oropharynx is clear and moist. No oropharyngeal exudate.  Eyes: Conjunctivae and EOM are normal. Pupils are equal, round, and reactive to light. No scleral icterus.  Neck: Normal range of motion. Neck supple. No JVD present. No thyromegaly present.  Cardiovascular: Normal rate, regular rhythm and normal heart sounds.  No murmur heard. No BLE edema. Pulmonary/Chest: Effort normal and breath sounds normal. No respiratory distress. Abdominal: Soft. Bowel sounds are normal, no distension. There is no tenderness. no masses Breast: mass on left upper breast around 12 o'clock,  no nipple discharge or rashes FEMALE GENITALIA:  Not done  RECTAL: not done  Musculoskeletal: Normal range of motion, no joint effusions.,mild crepitus with extension of left knee  No gross deformities Neurological: he is alert and oriented to person, place, and time. No cranial nerve deficit. Staggering gait when standing up - chronic  Skin: Skin is warm and dry. No rash noted. No erythema.  Psychiatric: Patient has a normal mood and affect. behavior is normal. Judgment and thought content normal.   Recent Results (from the past 2160 hour(s))  Comprehensive metabolic panel     Status: None   Collection Time: 10/23/22 12:33 PM  Result Value Ref Range   Glucose 85 70 - 99 mg/dL   BUN 12 8 - 27 mg/dL   Creatinine, Ser 1.61 0.57 - 1.00 mg/dL   eGFR 76 >09 UE/AVW/0.98   BUN/Creatinine Ratio 15 12 - 28   Sodium 142 134 - 144 mmol/L   Potassium 4.2 3.5 - 5.2 mmol/L   Chloride 106 96 - 106 mmol/L   CO2 22 20 - 29 mmol/L    Calcium 9.7 8.7 - 10.3 mg/dL   Total Protein 6.7 6.0 - 8.5 g/dL   Albumin 3.9 3.8 - 4.8 g/dL   Globulin, Total 2.8 1.5 - 4.5 g/dL   Bilirubin Total 0.6 0.0 - 1.2 mg/dL   Alkaline Phosphatase 117 44 - 121 IU/L   AST 22 0 - 40 IU/L   ALT 22 0 - 32 IU/L  CBC     Status: None   Collection Time: 10/23/22 12:33 PM  Result Value Ref Range   WBC 7.5 3.4 - 10.8 x10E3/uL   RBC 4.66 3.77 - 5.28 x10E6/uL   Hemoglobin 13.5 11.1 - 15.9 g/dL   Hematocrit 11.9 14.7 - 46.6 %   MCV 90 79 - 97 fL   MCH 29.0 26.6 - 33.0 pg   MCHC 32.1 31.5 - 35.7 g/dL   RDW 82.9 56.2 - 13.0 %   Platelets 229 150 - 450 x10E3/uL  TSH     Status: None   Collection Time: 10/23/22 12:33 PM  Result Value Ref Range   TSH 4.390 0.450 - 4.500 uIU/mL  Lipid panel     Status: Abnormal   Collection Time: 10/23/22 12:33 PM  Result Value Ref Range   Cholesterol, Total 120 100 - 199 mg/dL   Triglycerides 865 (H) 0 - 149 mg/dL   HDL 34 (L) >78 mg/dL   VLDL Cholesterol Cal 33 5 - 40 mg/dL   LDL Chol Calc (NIH) 53 0 - 99 mg/dL   Chol/HDL Ratio 3.5 0.0 - 4.4 ratio    Comment:                                   T. Chol/HDL Ratio                                             Men  Women                               1/2 Avg.Risk  3.4    3.3  Avg.Risk  5.0    4.4                                2X Avg.Risk  9.6    7.1                                3X Avg.Risk 23.4   11.0      Fall Risk:    10/24/2022    9:13 AM 08/08/2022   10:42 AM 12/12/2021   10:24 AM 08/15/2021   10:07 AM 08/09/2021    9:23 AM  Fall Risk   Falls in the past year? 0 0 0 0 0  Number falls in past yr: 0 0  0 0  Injury with Fall? 0 0  0 0  Risk for fall due to : No Fall Risks No Fall Risks Impaired balance/gait Impaired balance/gait   Follow up Falls prevention discussed Education provided;Falls prevention discussed Falls prevention discussed;Education provided;Falls evaluation completed Falls prevention discussed Falls  evaluation completed     Functional Status Survey: Is the patient deaf or have difficulty hearing?: Yes Does the patient have difficulty seeing, even when wearing glasses/contacts?: Yes Does the patient have difficulty concentrating, remembering, or making decisions?: No Does the patient have difficulty walking or climbing stairs?: Yes Does the patient have difficulty dressing or bathing?: No Does the patient have difficulty doing errands alone such as visiting a doctor's office or shopping?: No   Assessment & Plan  1. Well adult exam   2. Balance problem  - Ambulatory referral to Neurology  Going on for months, getting worse, almost feel in our office while getting up from chair, denies dizziness , she has some joint pains, but also happens when already walking   3. Postmenopausal osteoporosis  - Ambulatory referral to Endocrinology  4. Other specified hearing loss of both ears  - Ambulatory referral to Audiology  5. Needs flu shot  - Flu Vaccine QUAD High Dose(Fluad)  6. Mass of upper outer quadrant of left breast  - MM 3D DIAGNOSTIC MAMMOGRAM BILATERAL BREAST; Future - US BREAST COMPLETE UNI LEFT INC AXILLA; Future - US BREAST COMPLETE UNI RIGHT INC AXILLA; Future    -USPSTF grade A and B recommendations reviewed with patient; age-appropriate recommendations, preventive care, screening tests, etc discussed and encouraged; healthy living encouraged; see AVS for patient education given to patient -Discussed importance of 150 minutes of physical activity weekly, eat two servings of fish weekly, eat one serving of tree nuts ( cashews, pistachios, pecans, almonds.[name]) every other day, eat 6 servings of fruit/vegetables daily and drink plenty of water and avoid sweet beverages.   -Reviewed Health Maintenance: Yes.

## 2022-10-23 NOTE — Telephone Encounter (Signed)
Spoke to pt on Home number and asked her to wear her [employer] as soon as she gets it so it can be returned by 9/26 in time for her Echo.  Pt agreed to plan.

## 2022-10-24 ENCOUNTER — Encounter: Payer: Self-pay | Admitting: Family Medicine

## 2022-10-24 ENCOUNTER — Ambulatory Visit (INDEPENDENT_AMBULATORY_CARE_PROVIDER_SITE_OTHER): Payer: 59 | Admitting: Family Medicine

## 2022-10-24 VITALS — BP 126/64 | HR 84 | Resp 16 | Ht 71.0 in | Wt 167.0 lb

## 2022-10-24 DIAGNOSIS — N6321 Unspecified lump in the left breast, upper outer quadrant: Secondary | ICD-10-CM

## 2022-10-24 DIAGNOSIS — H918X3 Other specified hearing loss, bilateral: Secondary | ICD-10-CM

## 2022-10-24 DIAGNOSIS — Z0000 Encounter for general adult medical examination without abnormal findings: Secondary | ICD-10-CM

## 2022-10-24 DIAGNOSIS — Z23 Encounter for immunization: Secondary | ICD-10-CM | POA: Diagnosis not present

## 2022-10-24 DIAGNOSIS — M810 Age-related osteoporosis without current pathological fracture: Secondary | ICD-10-CM

## 2022-10-24 DIAGNOSIS — R2689 Other abnormalities of gait and mobility: Secondary | ICD-10-CM

## 2022-10-24 LAB — COMPREHENSIVE METABOLIC PANEL
ALT: 22 IU/L (ref 0–32)
AST: 22 IU/L (ref 0–40)
Albumin: 3.9 g/dL (ref 3.8–4.8)
Alkaline Phosphatase: 117 IU/L (ref 44–121)
BUN/Creatinine Ratio: 15 (ref 12–28)
BUN: 12 mg/dL (ref 8–27)
Bilirubin Total: 0.6 mg/dL (ref 0.0–1.2)
CO2: 22 mmol/L (ref 20–29)
Calcium: 9.7 mg/dL (ref 8.7–10.3)
Chloride: 106 mmol/L (ref 96–106)
Creatinine, Ser: 0.8 mg/dL (ref 0.57–1.00)
Globulin, Total: 2.8 g/dL (ref 1.5–4.5)
Glucose: 85 mg/dL (ref 70–99)
Potassium: 4.2 mmol/L (ref 3.5–5.2)
Sodium: 142 mmol/L (ref 134–144)
Total Protein: 6.7 g/dL (ref 6.0–8.5)
eGFR: 76 mL/min/{1.73_m2} (ref >59)

## 2022-10-24 LAB — CBC
Hematocrit: 42.1 % (ref 34.0–46.6)
Hemoglobin: 13.5 g/dL (ref 11.1–15.9)
MCH: 29 pg (ref 26.6–33.0)
MCHC: 32.1 g/dL (ref 31.5–35.7)
MCV: 90 fL (ref 79–97)
Platelets: 229 10*3/uL (ref 150–450)
RBC: 4.66 x10E6/uL (ref 3.77–5.28)
RDW: 13.5 % (ref 11.7–15.4)
WBC: 7.5 10*3/uL (ref 3.4–10.8)

## 2022-10-24 LAB — LIPID PANEL
Chol/HDL Ratio: 3.5 ratio (ref 0.0–4.4)
Cholesterol, Total: 120 mg/dL (ref 100–199)
HDL: 34 mg/dL — ABNORMAL LOW (ref >39)
LDL Chol Calc (NIH): 53 mg/dL (ref 0–99)
Triglycerides: 199 mg/dL — ABNORMAL HIGH (ref 0–149)
VLDL Cholesterol Cal: 33 mg/dL (ref 5–40)

## 2022-10-24 LAB — TSH: TSH: 4.39 u[IU]/mL (ref 0.450–4.500)

## 2022-10-24 NOTE — Patient Instructions (Signed)
RSV and COVID at local pharmacy

## 2022-10-26 DIAGNOSIS — R002 Palpitations: Secondary | ICD-10-CM | POA: Diagnosis not present

## 2022-11-04 ENCOUNTER — Other Ambulatory Visit: Payer: Self-pay | Admitting: Physician Assistant

## 2022-11-04 DIAGNOSIS — I6523 Occlusion and stenosis of bilateral carotid arteries: Secondary | ICD-10-CM

## 2022-11-04 DIAGNOSIS — E785 Hyperlipidemia, unspecified: Secondary | ICD-10-CM

## 2022-11-04 DIAGNOSIS — R002 Palpitations: Secondary | ICD-10-CM

## 2022-11-04 DIAGNOSIS — I471 Supraventricular tachycardia, unspecified: Secondary | ICD-10-CM

## 2022-11-04 DIAGNOSIS — R0602 Shortness of breath: Secondary | ICD-10-CM

## 2022-11-04 DIAGNOSIS — I480 Paroxysmal atrial fibrillation: Secondary | ICD-10-CM

## 2022-11-04 DIAGNOSIS — R5383 Other fatigue: Secondary | ICD-10-CM

## 2022-11-04 DIAGNOSIS — I10 Essential (primary) hypertension: Secondary | ICD-10-CM

## 2022-11-04 DIAGNOSIS — I2510 Atherosclerotic heart disease of native coronary artery without angina pectoris: Secondary | ICD-10-CM

## 2022-11-04 DIAGNOSIS — I447 Left bundle-branch block, unspecified: Secondary | ICD-10-CM

## 2022-11-04 DIAGNOSIS — I5189 Other ill-defined heart diseases: Secondary | ICD-10-CM

## 2022-11-12 ENCOUNTER — Ambulatory Visit
Admission: RE | Admit: 2022-11-12 | Discharge: 2022-11-12 | Disposition: A | Discharge status: Home / Self Care | Payer: 59 | Source: Ambulatory Visit | Attending: Family Medicine | Admitting: Family Medicine

## 2022-11-12 DIAGNOSIS — R92323 Mammographic fibroglandular density, bilateral breasts: Secondary | ICD-10-CM | POA: Diagnosis not present

## 2022-11-12 DIAGNOSIS — N6320 Unspecified lump in the left breast, unspecified quadrant: Secondary | ICD-10-CM | POA: Diagnosis not present

## 2022-11-12 DIAGNOSIS — N6321 Unspecified lump in the left breast, upper outer quadrant: Secondary | ICD-10-CM | POA: Diagnosis not present

## 2022-11-12 DIAGNOSIS — N6001 Solitary cyst of right breast: Secondary | ICD-10-CM | POA: Diagnosis not present

## 2022-11-12 DIAGNOSIS — N6002 Solitary cyst of left breast: Secondary | ICD-10-CM | POA: Diagnosis not present

## 2022-11-13 ENCOUNTER — Other Ambulatory Visit: Payer: Self-pay | Admitting: Family Medicine

## 2022-11-13 DIAGNOSIS — R928 Other abnormal and inconclusive findings on diagnostic imaging of breast: Secondary | ICD-10-CM

## 2022-11-13 DIAGNOSIS — R002 Palpitations: Secondary | ICD-10-CM | POA: Diagnosis not present

## 2022-11-14 ENCOUNTER — Ambulatory Visit: Payer: 59 | Attending: Physician Assistant

## 2022-11-14 DIAGNOSIS — R0602 Shortness of breath: Secondary | ICD-10-CM

## 2022-11-14 DIAGNOSIS — I2510 Atherosclerotic heart disease of native coronary artery without angina pectoris: Secondary | ICD-10-CM

## 2022-11-14 DIAGNOSIS — I480 Paroxysmal atrial fibrillation: Secondary | ICD-10-CM

## 2022-11-14 LAB — ECHOCARDIOGRAM COMPLETE
Area-P 1/2: 3.31 cm2
S' Lateral: 3.2 cm

## 2022-11-27 ENCOUNTER — Ambulatory Visit
Admission: RE | Admit: 2022-11-27 | Discharge: 2022-11-27 | Disposition: A | Discharge status: Home / Self Care | Payer: 59 | Source: Ambulatory Visit | Attending: Internal Medicine | Admitting: Internal Medicine

## 2022-11-27 DIAGNOSIS — N62 Hypertrophy of breast: Secondary | ICD-10-CM | POA: Insufficient documentation

## 2022-11-27 DIAGNOSIS — R928 Other abnormal and inconclusive findings on diagnostic imaging of breast: Secondary | ICD-10-CM | POA: Diagnosis present

## 2022-11-27 HISTORY — PX: BREAST BIOPSY: SHX20

## 2022-11-27 MED ORDER — LIDOCAINE-EPINEPHRINE 1 %-1:100000 IJ SOLN
20.0000 mL | Freq: Once | INTRAMUSCULAR | Status: AC
  Administered 2022-11-27: 20 mL
  Filled 2022-11-27: qty 20

## 2022-11-27 MED ORDER — LIDOCAINE 1 % OPTIME INJ - NO CHARGE
5.0000 mL | Freq: Once | INTRAMUSCULAR | Status: AC
  Administered 2022-11-27: 5 mL
  Filled 2022-11-27: qty 6

## 2022-11-28 ENCOUNTER — Encounter: Payer: Self-pay | Admitting: *Deleted

## 2022-11-28 DIAGNOSIS — N6489 Other specified disorders of breast: Secondary | ICD-10-CM

## 2022-11-28 LAB — SURGICAL PATHOLOGY

## 2022-11-28 NOTE — Progress Notes (Signed)
Referral recieved from [facility] for benign breast mass. She would like to be referred to [facility], referral entered.  Their office will call with the appointment.

## 2022-12-05 ENCOUNTER — Encounter: Payer: Self-pay | Admitting: General Surgery

## 2022-12-05 ENCOUNTER — Ambulatory Visit: Payer: 59 | Admitting: General Surgery

## 2022-12-05 VITALS — BP 137/71 | HR 86 | Temp 97.8°F | Ht 71.0 in | Wt 168.4 lb

## 2022-12-05 DIAGNOSIS — N6321 Unspecified lump in the left breast, upper outer quadrant: Secondary | ICD-10-CM

## 2022-12-05 DIAGNOSIS — J449 Chronic obstructive pulmonary disease, unspecified: Secondary | ICD-10-CM

## 2022-12-05 DIAGNOSIS — N6320 Unspecified lump in the left breast, unspecified quadrant: Secondary | ICD-10-CM

## 2022-12-05 NOTE — Patient Instructions (Addendum)
If you have any concerns or questions, please feel free to call our office.  Breast Self-Awareness Breast self-awareness means being familiar with how your breasts look and feel. It involves checking your breasts regularly and telling your health care provider about any changes. Practicing breast self-awareness helps to maintain breast health. Sometimes, changes are not harmful (are benign). Other times, a change in your breasts can be a sign of a serious medical problem. Being familiar with the look and feel of your breasts can help you catch a breast problem while it is still small and can be treated. You should do breast self-exams even if you have breast implants. What you need: A mirror. A well-lit room. A pillow or other soft object. How to do a breast self-exam A breast self-exam is one way to learn what is normal for your breasts and whether your breasts are changing. To do a breast self-exam: Look for changes  Remove all the clothing above your waist. Stand in front of a mirror in a room with good lighting. Put your hands down at your sides. Compare your breasts in the mirror. Look for differences between them (asymmetry), such as: Differences in shape. Differences in size. Puckers, dips, and bumps in one breast and not the other. Look at each breast for changes in the skin, such as: Redness. Scaly areas. Skin thickening. Dimpling. Open sores (ulcers). Look for changes in your nipples, such as: Discharge. Bleeding. Dimpling. Redness. A nipple that looks pushed in (retracted), or that has changed position. Feel for changes Carefully feel your breasts for lumps and changes. It is best to do this self-exam while lying down. Follow these steps to feel each breast: Place a pillow under the shoulder of one side of your body. Place the arm of that side of your body behind your head. Feel the breast of that side of your body using the hand of the opposite arm. To do this: Start  in the nipple area and use the pads of your three middle fingers to make -inch (2 cm) overlapping circles. Use light, medium, and then firm pressure as you feel your breast, gently covering the entire breast area and armpit. Continue the overlapping circles, moving downward over the breast until you feel your ribs below your breast. Then, make circles with your fingers going upward until you reach your collarbone. Next, make circles by moving outward across your breast and into your armpit area. Squeeze the nipple. Check for discharge and lumps. Repeat steps 1-7 to check your other breast. Sit or stand in the tub or shower. With soapy water on your skin, feel each breast the same way you did when you were lying down. Write down what you find Writing down what you find can help you remember what to discuss with your health care provider. Write down: What is normal for each breast. Any changes that you find in each breast. These include: The kind of changes you find. Any pain or tenderness. Size and location of any lumps. Where you are in your menstrual cycle, if you are still getting your menstrual period (menstruating). General tips If you are breastfeeding, the best time to examine your breasts is after a feeding or after using a breast pump. If you menstruate, the best time to examine your breasts is 5-7 days after your menstrual period. Breasts are generally lumpier during menstrual periods, and it may be more difficult to notice changes. With time and practice, you will become more familiar with  the differences in your breasts and more comfortable with the exam. Contact a health care provider if: You see a change in the shape or size of your breasts or nipples. You see a change in the skin of your breast or nipples, such as a reddened or scaly area. You have unusual discharge from your nipples. You find a new lump or thick area. You have breast pain. You have any concerns about your  breast health. Summary Breast self-awareness includes looking for physical changes in your breasts and feeling for any changes within your breasts. Breast self-awareness should be done in front of a mirror in a well-lit room. If you menstruate, the best time to examine your breasts is 5-7 days after your menstrual period. Tell your health care provider about any changes you notice in your breasts. Changes include changes in size, changes on the skin, pain or tenderness, or unusual fluid from your nipples. This information is not intended to replace advice given to you by your health care provider. Make sure you discuss any questions you have with your health care provider. Document Revised: 07/12/2021 Document Reviewed: 12/07/2020 Elsevier Patient Education  2024 [employer].

## 2022-12-05 NOTE — Progress Notes (Signed)
Patient ID: Kristie Walters, female   DOB: [birth year], 77 y.o.   MRN: [id number] CC: Complex sclerosing lesion of Left Breast History of Present Illness Kristie Walters is a 77 y.o. 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  The patient notes that she saw her primary care doctor about a month and a half ago and had a breast exam.  At that time she was noted to have a small mass in the left breast.  The patient notes that she had noticed it before but it did not cause her any problems.  Due to the mass she was referred for a mammogram that showed a suspicious lesion.  She had a biopsy of this lesion whose pathology was consistent with complex sclerosing lesion.  She denies any overlying skin changes, nipple discharge or dimpling of her skin.  She also denies any growth of the mass since she first noticed it.  She does not have a family history of breast cancer.  Her first menses was at 15 and she is a G2, P2.  She was 25 when she had her first child and her children are currently 33 and 46.  Of note the patient has a past medical history significant for COPD.  She reports that when she walks to her mailbox about 100 drops from her house that she gets short of breath.  She also has a history of a flutter and is on Eliquis.  She says that about once a week she feels like she has tachypalpitations.  Past Medical History Past Medical History:  Diagnosis Date   Allergy    Atrial fibrillation (HCC)    Basal cell carcinoma of nose 08/12/2017   Carotid artery disease (HCC)    s/p L. CEA in July 2016   Centrilobular emphysema (HCC) 09/29/2017   Collagen vascular disease (HCC)    Left carotid stenosis   COPD (chronic obstructive pulmonary disease) (HCC)    Coronary artery disease 09/29/2017   Noted on chest CT July 2019   Dysrhythmia    Elevated blood pressure (not hypertension)    Fatty liver 09/29/2017   Chest CT July 2019   GERD (gastroesophageal reflux  disease)    Hyperlipidemia    Personal history of tobacco use, presenting hazards to health 08/31/2015   PONV (postoperative nausea and vomiting)       Past Surgical History:  Procedure Laterality Date   BREAST BIOPSY Left 11/27/2022   stereo bx/ x clip/ path pending   BREAST BIOPSY Left 11/27/2022   MM LT BREAST BX W LOC DEV 1ST LESION IMAGE BX SPEC STEREO GUIDE 11/27/2022 [facility]   COLONOSCOPY WITH PROPOFOL N/A 08/22/2020   Procedure: COLONOSCOPY WITH PROPOFOL;  Surgeon: Midge Minium, MD;  Location: [facility];  Service: Endoscopy;  Laterality: N/A;   ENDARTERECTOMY Left 08/25/2014   Procedure: ENDARTERECTOMY CAROTID;  Surgeon: Annice Needy, MD;  Location: [facility];  Service: Vascular;  Laterality: Left;   MOHS SURGERY     MOHS SURGERY  09/23/2017   nose   PERIPHERAL VASCULAR CATHETERIZATION N/A 07/20/2014   Procedure: Carotid Angiography;  Surgeon: Annice Needy, MD;  Location: [facility];  Service: Cardiovascular;  Laterality: N/A;   TONSILLECTOMY     TUBAL LIGATION      Allergies  Allergen Reactions   Morphine Nausea Only and Nausea And Vomiting   Morphine And Codeine Nausea And Vomiting   Zoster Vaccine Live Itching and Rash  Current Outpatient Medications  Medication Sig Dispense Refill   albuterol (VENTOLIN HFA) 108 (90 Base) MCG/ACT inhaler Inhale 2 puffs into the lungs every 6 (six) hours as needed for wheezing or shortness of breath. 8 g 2   amLODipine (NORVASC) 5 MG tablet Take 1 tablet (5 mg total) by mouth as needed (AS NEEDED for SBP (top blood pressure number) greater than 140). 90 tablet 3   apixaban (ELIQUIS) 5 MG TABS tablet Take 1 tablet (5 mg total) by mouth 2 (two) times daily. 180 tablet 3   aspirin 81 MG tablet Take 81 mg by mouth daily.     atorvastatin (LIPITOR) 40 MG tablet Take 1 tablet (40 mg total) by mouth daily. 30 tablet 3   calcium carbonate (OS-CAL - DOSED IN MG OF ELEMENTAL CALCIUM) 1250 (500 Ca) MG tablet Take 1 tablet  by mouth.     cholecalciferol (VITAMIN D) 1000 units tablet Take 1,000 Units by mouth daily.     Cyanocobalamin (VITAMIN B-12) 5000 MCG SUBL Place under the tongue.     ezetimibe (ZETIA) 10 MG tablet TAKE 1 TABLET BY MOUTH EVERY DAY 90 tablet 2   Fish Oil-Cholecalciferol (FISH OIL + D3 PO) Take 1,000 mg by mouth 1 day or 1 dose.     Fluticasone-Umeclidin-Vilant (TRELEGY ELLIPTA) 100-62.5-25 MCG/ACT AEPB INHALE 1 PUFF BY MOUTH EVERY DAY 60 each 6   metoprolol succinate (TOPROL-XL) 25 MG 24 hr tablet Take 1 tablet (25 mg total) by mouth daily. 90 tablet 3   metoprolol tartrate (LOPRESSOR) 25 MG tablet Take 1 tablet (25 mg total) by mouth 2 (two) times daily as needed (As needed for palpitations). 180 tablet 3   omeprazole (PRILOSEC) 40 MG capsule TAKE 1 CAPSULE (40 MG TOTAL) BY MOUTH AS NEEDED 90 capsule 0   pregabalin (LYRICA) 50 MG capsule Take 1 capsule (50 mg total) by mouth 3 (three) times daily. 270 capsule 1   No current facility-administered medications for this visit.    Family History Family History  Problem Relation Age of Onset   Heart attack Mother    Hypercholesterolemia Mother    Hypertension Mother    Peripheral vascular disease Mother    Dementia Mother    Hypothyroidism Mother    CVA Father    Liver cancer Father    Diabetes Brother    Kidney cancer Sister    Diabetes Brother    Alzheimer's disease Brother    Other Brother        alzheimers   Lymphoma Son    HIV Son    Breast cancer Neg Hx       Social History Social History   Tobacco Use   Smoking status: Former    Current packs/day: 0.00    Average packs/day: 1 pack/day for 55.0 years (55.0 ttl pk-yrs)    Types: Cigarettes    Start date: 11/26/1961    Quit date: 11/26/2016    Years since quitting: 6.0    Passive exposure: Past   Smokeless tobacco: Former    Types: Snuff   Tobacco comments:    smoking cessation materials not required  Vaping Use   Vaping status: Never Used  Substance Use Topics    Alcohol use: No   Drug use: No        ROS Full ROS of systems performed and is otherwise negative there than what is stated in the HPI  Physical Exam Blood pressure 137/71, pulse 86, temperature 97.8 F (36.6 C), temperature source  Oral, height 5\' 11"  (1.803 m), weight 168 lb 6.4 oz (76.4 kg), SpO2 97%.  Alert and oriented x 3, no acute distress, PERRLA, walking with a cane.  She is not short of breath on rest.  Breast exam performed in the presence of a chaperone.  She has no right axillary lymphadenopathy.  There are no dominant masses in her right breast and there is no nipple retraction.  In her left breast there are no axillary lymphadenopathy.  There is a small lump located in the upper outer quadrant approximately 6 cm from the nipple on the left breast.  This is not painful to touch.  Her biopsy site is dressed with Steri-Strips.  There is some blistering of the skin superior to this which she says was where the adhesive tape was on her breast.  This is healing well  Data Reviewed I reviewed her mammogram and ultrasound.  There is an area of concern in the left upper outer quadrant of her breast.  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  I have personally reviewed the patient's imaging and medical records.    Assessment    77 year old with a left breast mass with a biopsy that showed complex sclerosing lesion usual ductal hyperplasia.  Plan    The patient has a number of comorbidities including COPD that limits her ability to walk without shortness of breath.  She also has a history of A-fib and continues to have palpitations on current therapy of metoprolol.  She is anticoagulated on Eliquis.  I discussed with her that the complex closing lesion is a benign lesion however it is recommended in some patients for excision given the risk of upgrade to cancer.  We had a frank discussion about the risk of surgery for her including risk of  cardiopulmonary complications given her medical comorbidities as well as bleeding due to her anticoagulation medication.  At her age I think the best course of action is to monitor this lesion and get a mammogram in 6 months.  I did discuss with her that if she was adamant about surgery this would be another treatment option although the risk are quite high for her.  She also agrees that she would like to avoid surgery if possible.  We will plan to see her again in 6 months after she has received a repeat mammogram.  Kandis Cocking 12/05/2022, 10:48 AM

## 2022-12-20 NOTE — Progress Notes (Unsigned)
Cardiology Office Note    Date:  12/24/2022   ID:  ELEENA GRATER, DOB [birth year], MRN [id number]  PCP:  Alba Cory, MD  Cardiologist:  Julien Nordmann, MD  Electrophysiologist:  None   Chief Complaint: Follow up  History of Present Illness:   Kristie Walters is a 77 y.o. 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   She was previously followed by Dr. Juliann Pares though subsequently transitioned to Dr. Mariah Milling in 09/2017.  Prior echo from 09/2015, done by outside cardiology group, showed an EF greater than 55%, mild LVH, normal RV systolic function, mild MR/TR.  Nuclear stress test at that time showed an EF of 77% with no evidence of significant ischemia or scar.  She was referred to [facility] in 09/2017 for evaluation of incidentally noted aortic atherosclerosis and coronary artery calcium along the LAD on noninvasive imaging.  She was noted to not be very active at baseline secondary to back pain.  She did note some rare palpitations associated with stress that improved with rest.  Aggressive primary prevention was recommended.  She declined stress testing at that time.   She was seen in the ED in 02/2019 with palpitations and noted to be in new onset A. fib with RVR with LBBB (EMS EKG).  She had spontaneously converted to sinus rhythm in the field following IV metoprolol.  She was noted to have a new left bundle which persisted in sinus rhythm.  High-sensitivity troponin negative x2.  She was  placed on metoprolol and Eliquis.  Subsequent echo in 03/2019 showed an EF of 55 to 60%, no regional wall motion abnormalities, grade 2 diastolic dysfunction, normal RV systolic function and ventricular cavity size, normal size left atrium, and mildly elevated PASP.     She was seen on 01/05/2020 noting a 2-week history of increased palpitations as well as some associated dizziness, shortness of breath, and chest discomfort.  Lexiscan MPI on 01/11/2020 showed no evidence of significant ischemia or scar with an LVEF greater than 65%.  With administration of regadenoson she developed a LBBB with known intermittent LBBB.  Attenuation corrected CT images noted aortic atherosclerosis with no significant coronary artery calcification.  Overall, this was a low risk study.  Zio patch showed a predominant rhythm of sinus with an average heart rate of 65 bpm.  First-degree AV block was noted.  18 episodes of SVT were noted with the longest interval lasting 10 beats with an average rate of 109 bpm and the fastest interval lasting 4 beats with a maximum rate of 158 bpm.  Isolated PACs, atrial couplets, atrial triplets, and PVCs were noted.  There were 6 patient triggered events and these were not associated with significant arrhythmia.  Echo in 02/2020 showed an EF of 55-60%, no RWMA, Gr1DD, normal RV systolic function and ventricular cavity size, and a PASP of 35.7 mmHg.   She was seen in the office in 5//2023 and did report onset of left-sided palpitations and pinpoint chest discomfort that began after taking metoprolol titrate from a different  manufacturer with symptoms typically lasting 1 to 2 minutes and spontaneously resolving.  With this, she self discontinued Lopressor and noted improvement, though not resolution of symptoms.  She was also under increased stress with the health of her son.  We underwent a trial of Toprol-XL 25 mg daily.   She was seen in the office in 07/2021 with chronic stable exertional dyspnea.   Her main complaint at that time was fatigue that has been present for approximately 1 week.  Upon initiating metoprolol succinate, she noted resolution of palpitations.  Her son and daughter were doing well following stem cell donor/transplant.  She did not qualify for sleep study based on her STOP-BANG score.   She was seen in the office in 09/2021 noting chronic exertional dyspnea was somewhat improved.  Fatigue was resolved.  Stress level was improved.   She was seen in the ED on 10/24/2021 with A-fib with RVR with rates ranging from 90 to 170 bpm with associated shortness of breath generalized weakness, fatigue, and chills.  EKG showed A-fib with RVR, 106 bpm, and known LBBB.  High-sensitivity troponin of 13 with a delta troponin of 20.  Chest x-ray showed emphysema with no acute cardiopulmonary process.  She spontaneously converted to sinus rhythm in the ED.   She was seen in ED follow-up on 11/01/2021 and was without further chest discomfort or symptoms of decompensation.  She did note some brief paroxysms of palpitations.  She was maintaining sinus rhythm this.  Coronary CTA on 11/15/2021 demonstrated a calcium score of 170 which was the 67th percentile with 25 to 49% proximal LAD stenosis noted.   She was last seen in the office on 10/23/2022 noting an increase in fatigue, shortness of breath, palpitations, and abdominal bloating.  She was without frank angina.  Zio patch in 10/2022 showed a predominant rhythm of sinus with an average rate of 70 bpm (range 49 to 148 bpm), intermittent bundle branch block, 18 episodes of SVT lasting up to 12.9 seconds, and rare atrial and ventricular ectopy.  Echo on 11/14/2022 showed an EF of 60 to 65%, no regional wall motion abnormalities, grade 1 diastolic dysfunction, normal RV systolic function and ventricular cavity size, mild mitral regurgitation, tricuspid aortic valve, and an estimated right atrial pressure of 3 mmHg.  She is without symptoms of angina or cardiac  decompensation.  She continues to report chronic fatigue and dyspnea.  She also feels like there has been an increase in palpitations, that are noticeable when resting and trying to sleep.  There is not an association of palpitations and dyspnea/fatigue.  Palpitations are short-lived, usually 1 to 2 seconds.  She has needed to take a as needed metoprolol once to twice per month.  No falls or symptoms concerning for bleeding.  Weight stable.  No lower extremity swelling or progressive orthopnea.  No dizziness, presyncope, or syncope.   Labs independently reviewed: 10/2022 - TC 120, TG 199, HDL 34, LDL 53, TSH normal, Hgb 13.5, PLT 229, BUN 12, serum creatinine 0.8, potassium 4.2, albumin 3.9, AST/ALT normal  Past Medical History:  Diagnosis Date   Allergy    Atrial fibrillation (HCC)    Basal cell carcinoma of nose 08/12/2017   Carotid artery disease (HCC)    s/p L. CEA in July 2016   Centrilobular emphysema (HCC) 09/29/2017   Collagen vascular disease (HCC)    Left carotid stenosis   COPD (chronic obstructive pulmonary disease) (HCC)    Coronary artery disease 09/29/2017   Noted  on chest CT July 2019   Dysrhythmia    Elevated blood pressure (not hypertension)    Fatty liver 09/29/2017   Chest CT July 2019   GERD (gastroesophageal reflux disease)    Hyperlipidemia    Personal history of tobacco use, presenting hazards to health 08/31/2015   PONV (postoperative nausea and vomiting)     Past Surgical History:  Procedure Laterality Date   BREAST BIOPSY Left 11/27/2022   stereo bx/ x clip/ path pending   BREAST BIOPSY Left 11/27/2022   MM LT BREAST BX W LOC DEV 1ST LESION IMAGE BX SPEC STEREO GUIDE 11/27/2022 [facility]   COLONOSCOPY WITH PROPOFOL N/A 08/22/2020   Procedure: COLONOSCOPY WITH PROPOFOL;  Surgeon: Midge Minium, MD;  Location: [facility];  Service: Endoscopy;  Laterality: N/A;   ENDARTERECTOMY Left 08/25/2014   Procedure: ENDARTERECTOMY CAROTID;  Surgeon: Annice Needy, MD;  Location: [facility];  Service: Vascular;  Laterality: Left;   MOHS SURGERY     MOHS SURGERY  09/23/2017   nose   PERIPHERAL VASCULAR CATHETERIZATION N/A 07/20/2014   Procedure: Carotid Angiography;  Surgeon: Annice Needy, MD;  Location: [facility];  Service: Cardiovascular;  Laterality: N/A;   TONSILLECTOMY     TUBAL LIGATION      Current Medications: Current Meds  Medication Sig   albuterol (VENTOLIN HFA) 108 (90 Base) MCG/ACT inhaler Inhale 2 puffs into the lungs every 6 (six) hours as needed for wheezing or shortness of breath.   amLODipine (NORVASC) 5 MG tablet Take 1 tablet (5 mg total) by mouth as needed (AS NEEDED for SBP (top blood pressure number) greater than 140).   apixaban (ELIQUIS) 5 MG TABS tablet Take 1 tablet (5 mg total) by mouth 2 (two) times daily.   aspirin 81 MG tablet Take 81 mg by mouth daily.   atorvastatin (LIPITOR) 40 MG tablet Take 1 tablet (40 mg total) by mouth daily.   calcium carbonate (OS-CAL - DOSED IN MG OF ELEMENTAL CALCIUM) 1250 (500 Ca) MG tablet Take 1 tablet by mouth.   cholecalciferol (VITAMIN D) 1000 units tablet Take 1,000 Units by mouth daily.   Cyanocobalamin (VITAMIN B-12) 5000 MCG SUBL Place under the tongue.   ezetimibe (ZETIA) 10 MG tablet TAKE 1 TABLET BY MOUTH EVERY DAY   Fish Oil-Cholecalciferol (FISH OIL + D3 PO) Take 1,000 mg by mouth 1 day or 1 dose.   Fluticasone-Umeclidin-Vilant (TRELEGY ELLIPTA) 100-62.5-25 MCG/ACT AEPB INHALE 1 PUFF BY MOUTH EVERY DAY   metoprolol tartrate (LOPRESSOR) 25 MG tablet Take 1 tablet (25 mg total) by mouth 2 (two) times daily as needed (As needed for palpitations).   omeprazole (PRILOSEC) 40 MG capsule TAKE 1 CAPSULE (40 MG TOTAL) BY MOUTH AS NEEDED   pregabalin (LYRICA) 50 MG capsule Take 1 capsule (50 mg total) by mouth 3 (three) times daily.   [DISCONTINUED] metoprolol succinate (TOPROL-XL) 25 MG 24 hr tablet Take 1 tablet (25 mg total) by mouth daily.    Allergies:   Morphine,  Morphine and codeine, and Zoster vaccine live   Social History   Socioeconomic History   Marital status: Divorced    Spouse name: Not on file   Number of children: 2   Years of education: Not on file   Highest education level: 10th grade  Occupational History   Occupation: Retired  Tobacco Use   Smoking status: Former    Current packs/day: 0.00    Average packs/day: 1 pack/day for 55.0 years (55.0 ttl  pk-yrs)    Types: Cigarettes    Start date: 11/26/1961    Quit date: 11/26/2016    Years since quitting: 6.0    Passive exposure: Past   Smokeless tobacco: Former    Types: Snuff   Tobacco comments:    smoking cessation materials not required  Vaping Use   Vaping status: Never Used  Substance and Sexual Activity   Alcohol use: No   Drug use: No   Sexual activity: Not Currently  Other Topics Concern   Not on file  Social History Narrative    Pt lives alone   Social Determinants of Health   Financial Resource Strain: Low Risk  (08/08/2022)   Overall Financial Resource Strain (CARDIA)    Difficulty of Paying Living Expenses: Not hard at all  Food Insecurity: No Food Insecurity (08/08/2022)   Hunger Vital Sign    Worried About Running Out of Food in the Last Year: Never true    Ran Out of Food in the Last Year: Never true  Transportation Needs: No Transportation Needs (08/08/2022)   PRAPARE - Administrator, Civil Service (Medical): No    Lack of Transportation (Non-Medical): No  Physical Activity: Inactive (08/08/2022)   Exercise Vital Sign    Days of Exercise per Week: 0 days    Minutes of Exercise per Session: 0 min  Stress: Stress Concern Present (08/08/2022)   [employer] of Occupational Health - Occupational Stress Questionnaire    Feeling of Stress : To some extent  Social Connections: Moderately Isolated (08/08/2022)   Social Connection and Isolation Panel [NHANES]    Frequency of Communication with Friends and Family: More than three times a week     Frequency of Social Gatherings with Friends and Family: Three times a week    Attends Religious Services: More than 4 times per year    Active Member of Clubs or Organizations: No    Attends Banker Meetings: Never    Marital Status: Divorced     Family History:  The patient's family history includes Alzheimer's disease in her brother; CVA in her father; Dementia in her mother; Diabetes in her brother and brother; HIV in her son; Heart attack in her mother; Hypercholesterolemia in her mother; Hypertension in her mother; Hypothyroidism in her mother; Kidney cancer in her sister; Liver cancer in her father; Lymphoma in her son; Other in her brother; Peripheral vascular disease in her mother. There is no history of Breast cancer.  ROS:   12-point review of systems is negative unless otherwise noted in the HPI.   EKGs/Labs/Other Studies Reviewed:    Studies reviewed were summarized above. The additional studies were reviewed today:  Zio patch 10/2022: Normal sinus rhythm Patient had a min HR of 49 bpm, max HR of 148 bpm, and avg HR of 70 bpm. Intermittent Bundle Branch Block was present.  18 Supraventricular Tachycardia runs occurred, the run with the fastest interval lasting 12.9 secs with a max rate of 148 bpm, the longest lasting 26 beats with an avg rate of 126 bpm.    Isolated SVEs were rare (<1.0%), SVE Couplets were rare (<1.0%), and SVE Triplets were rare (<1.0%).  Isolated VEs were rare (<1.0%), and no VE Couplets or VE Triplets were present.   Patient triggered events recorded __________  2D echo 11/14/2022: 1. Left ventricular ejection fraction, by estimation, is 60 to 65%. The  left ventricle has normal function. The left ventricle has no regional  wall motion  abnormalities. Left ventricular diastolic parameters are  consistent with Grade I diastolic  dysfunction (impaired relaxation). The average left ventricular global  longitudinal strain is -19.6 %.    2. Right ventricular systolic function is normal. The right ventricular  size is normal. Tricuspid regurgitation signal is inadequate for assessing  PA pressure.   3. The mitral valve is normal in structure. Mild mitral valve  regurgitation. No evidence of mitral stenosis.   4. The aortic valve is tricuspid. Aortic valve regurgitation is not  visualized. No aortic stenosis is present.   5. The inferior vena cava is normal in size with greater than 50%  respiratory variability, suggesting right atrial pressure of 3 mmHg.  __________  Carotid artery ultrasound 07/31/2022: Summary:  Right Carotid: Velocities in the right ICA are consistent with a 1-39%  stenosis.  Non-hemodynamically significant plaque <50% noted in the CCA.   Left Carotid: Velocities in the left ICA are consistent with a 1-39% stenosis.   Vertebrals: Bilateral vertebral arteries demonstrate antegrade flow.  Subclavians: Normal flow hemodynamics were seen in bilateral subclavian arteries.  __________  Coronary CTA 11/15/2021: FINDINGS: Aorta: Normal size. Aortic root and descending aorta calcifications. No dissection.   Aortic Valve:  Trileaflet.  No calcifications.   Coronary Arteries:  Normal coronary origin.  Right dominance.   RCA is a dominant artery that gives rise to PDA and PLA. There is no plaque.   Left main gives rise to LAD and LCX arteries. There is no LM disease.   LAD has calcified plaque in the proximal segment causing mild stenosis (25-49%).   LCX is a non-dominant artery that gives rise to three obtuse marginal branches. There is no plaque.   Other findings:   Normal pulmonary vein drainage into the left atrium.   Normal left atrial appendage without a thrombus.   Normal size of the pulmonary artery.   IMPRESSION: 1. Coronary calcium score of 170. This was 67th percentile for age and sex matched control. 2. Normal coronary origin with right dominance. 3. Calcified plaque in the  proximal LAD causing mild stenosis (25-49%) 4. CAD-RADS 2. Mild non-obstructive CAD (25-49%). Consider non-atherosclerotic causes of chest pain. Consider preventive therapy and risk factor modification. __________   2D echo 09/04/2021: 1. Left ventricular ejection fraction, by estimation, is 55 to 60%. The  left ventricle has normal function. The left ventricle has no regional  wall motion abnormalities. Left ventricular diastolic parameters are  consistent with Grade I diastolic  dysfunction (impaired relaxation).   2. Right ventricular systolic function is normal. The right ventricular  size is normal. Tricuspid regurgitation signal is inadequate for assessing  PA pressure.   3. The mitral valve is normal in structure. No evidence of mitral valve  regurgitation. No evidence of mitral stenosis.   4. The aortic valve was not well visualized. Aortic valve regurgitation  is not visualized. No aortic stenosis is present.   5. The inferior vena cava is normal in size with greater than 50%  respiratory variability, suggesting right atrial pressure of 3 mmHg.   Comparison(s): 02/2020-EF 55-60%. __________   Carotid artery ultrasound 07/20/2021: Summary:  Right Carotid: Velocities in the right ICA are consistent with a 1-39% stenosis.   Left Carotid: Velocities in the left ICA are consistent with a 1-39%  stenosis.   Vertebrals:  Bilateral vertebral arteries demonstrate antegrade flow.  Subclavians: Normal flow hemodynamics were seen in bilateral subclavian arteries. ___________   2D echo 03/09/2020: 1. Left ventricular ejection fraction, by  estimation, is 55 to 60%. The  left ventricle has normal function. The left ventricle has no regional  wall motion abnormalities. Left ventricular diastolic parameters are  consistent with Grade I diastolic  dysfunction (impaired relaxation).   2. Right ventricular systolic function is normal. The right ventricular  size is normal. There is normal  pulmonary artery systolic pressure. The  estimated right ventricular systolic pressure is 35.7 mmHg.   3. The aortic valve was not well visualized. __________   Eugenie Birks MPI 01/11/2020: Normal pharmacologic myocardial perfusion stress test without evidence of significant ischemia or scar. The left ventricular ejection fraction is hyperdynamic (>65%). Left bundle branch block developed after administration of regadenoson. Patient known to have intermittent LBBB. Attenuation correction CT is notable for aortic atherosclerosis. There is no significant coronary artery calcification. This is a low risk study. __________   Luci Bank patch 12/2019: Normal sinus rhythm avg HR of 65 bpm.    Patient triggered events (6) were not associated with significant arrhythmia.   First Degree AV Block was present.  18 Supraventricular Tachycardia runs occurred, the run with the fastest interval lasting 4 beats with a max rate of 158 bpm, the longest lasting 10 beats with an avg rate of 109 bpm.   Isolated SVEs were rare (<1.0%), SVE Couplets were rare (<1.0%), and SVE Triplets were rare (<1.0%). Isolated VEs were rare (<1.0%), and no VE Couplets or VE Triplets were present. __________   2D echo 03/2019: 1. Left ventricular ejection fraction, by visual estimation, is 55 to  60%. The left ventricle has normal function. There is no left ventricular  hypertrophy.   2. Left ventricular diastolic parameters are consistent with Grade II  diastolic dysfunction (pseudonormalization).   3. The left ventricle has no regional wall motion abnormalities.   4. Global right ventricle has normal systolic function.The right  ventricular size is normal. No increase in right ventricular wall  thickness.   5. Left atrial size was normal.   6. Mildly elevated pulmonary artery systolic pressure.   EKG:  EKG is ordered today.  The EKG ordered today demonstrates NSR, 68 bpm, LBBB (known)  Recent Labs: 10/23/2022: ALT 22; BUN  12; Creatinine, Ser 0.80; Hemoglobin 13.5; Platelets 229; Potassium 4.2; Sodium 142; TSH 4.390  Recent Lipid Panel    Component Value Date/Time   CHOL 120 10/23/2022 1233   TRIG 199 (H) 10/23/2022 1233   HDL 34 (L) 10/23/2022 1233   CHOLHDL 3.5 10/23/2022 1233   CHOLHDL 4.2 04/18/2021 1423   LDLCALC 53 10/23/2022 1233   LDLCALC 79 04/18/2021 1423    PHYSICAL EXAM:    VS:  BP 123/70 (BP Location: Left Arm, Patient Position: Sitting, Cuff Size: Normal)   Pulse 68   Ht 5\' 11"  (1.803 m)   Wt 166 lb 6.4 oz (75.5 kg)   SpO2 96%   BMI 23.21 kg/m   BMI: Body mass index is 23.21 kg/m.  Physical Exam Vitals reviewed.  Constitutional:      Appearance: She is well-developed.  HENT:     Head: Normocephalic and atraumatic.  Eyes:     General:        Right eye: No discharge.        Left eye: No discharge.  Neck:     Vascular: No JVD.  Cardiovascular:     Rate and Rhythm: Normal rate and regular rhythm.     Heart sounds: Normal heart sounds, S1 normal and S2 normal. Heart sounds not distant. No midsystolic click  and no opening snap. No murmur heard.    No friction rub.  Pulmonary:     Effort: Pulmonary effort is normal. No respiratory distress.     Breath sounds: Normal breath sounds. No decreased breath sounds, wheezing, rhonchi or rales.  Chest:     Chest wall: No tenderness.  Abdominal:     General: There is no distension.  Musculoskeletal:     Cervical back: Normal range of motion.     Right lower leg: No edema.     Left lower leg: No edema.  Skin:    General: Skin is warm and dry.     Nails: There is no clubbing.  Neurological:     Mental Status: She is alert and oriented to person, place, and time.  Psychiatric:        Speech: Speech normal.        Behavior: Behavior normal.        Thought Content: Thought content normal.        Judgment: Judgment normal.     Wt Readings from Last 3 Encounters:  12/24/22 166 lb 6.4 oz (75.5 kg)  12/05/22 168 lb 6.4 oz (76.4 kg)   10/24/22 167 lb (75.8 kg)     ASSESSMENT & PLAN:   Nonobstructive CAD involving the native coronary arteries without angina with chronic dyspnea: She is without symptoms of angina or cardiac decompensation.  Coronary CTA in 10/2021 showed nonobstructive disease involving the LAD up to 49%.  In the setting of overall stable symptoms, she will follow-up with pulmonology next month as scheduled to assess her dyspnea, as she has concerns that this is pulmonary in etiology.  If pulmonology evaluation is stable to unrevealing, for if they feel symptoms are not pulmonary, would pursue RHC at a minimum (nonobstructive CAD by coronary CTA) with likely LHC as well.  Continue aggressive risk factor modification and current medical therapy including aspirin, atorvastatin, ezetimibe, and Toprol-XL.  PAF/PSVT: With an increase in palpitation burden noted titrate Toprol-XL to 37.5 mg daily with continuation of as needed of Lopressor 25 mg.  With regards to her A-fib, continue apixaban 5 mg twice daily in the setting of CHA2DS2-VASc at least 5.  Recent labs stable.  No falls or symptoms concerning for bleeding.  Diastolic dysfunction: Euvolemic and well compensated with grade 1 diastolic dysfunction noted on recent echo.  Normal right atrial pressure.  Unable to evaluate RVSP.  Not requiring standing loop diuretic.  Continue optimal blood pressure control.  Intermittent LBBB: Stable and without evidence of cardiomyopathy or decompensation.  Asymptomatic.  HTN: Blood pressure is well-controlled in the office today.  Remains on amlodipine and Toprol-XL.  HLD: LDL 53 in 10/2022 with normal AST/ALT at that time.  Remains on atorvastatin 40 mg and ezetimibe.  Carotid artery disease: Status post left-sided CEA in 08/2014.  Stable 1 to 39% bilateral ICA stenoses by ultrasound in 07/2022.  Remains on aspirin and statin.     Disposition: F/u with Dr. Mariah Milling or an APP in 2 months.   Medication Adjustments/Labs and Tests  Ordered: Current medicines are reviewed at length with the patient today.  Concerns regarding medicines are outlined above. Medication changes, Labs and Tests ordered today are summarized above and listed in the Patient Instructions accessible in Encounters.   Signed, Eula Listen, PA-C 12/24/2022 12:18 PM     [facility] [place] [address] [place], [place] [address] [phone]

## 2022-12-23 DIAGNOSIS — M810 Age-related osteoporosis without current pathological fracture: Secondary | ICD-10-CM | POA: Diagnosis not present

## 2022-12-23 DIAGNOSIS — E559 Vitamin D deficiency, unspecified: Secondary | ICD-10-CM | POA: Diagnosis not present

## 2022-12-24 ENCOUNTER — Encounter: Payer: Self-pay | Admitting: Physician Assistant

## 2022-12-24 ENCOUNTER — Ambulatory Visit: Payer: 59 | Attending: Physician Assistant | Admitting: Physician Assistant

## 2022-12-24 VITALS — BP 123/70 | HR 68 | Ht 71.0 in | Wt 166.4 lb

## 2022-12-24 DIAGNOSIS — I471 Supraventricular tachycardia, unspecified: Secondary | ICD-10-CM

## 2022-12-24 DIAGNOSIS — E785 Hyperlipidemia, unspecified: Secondary | ICD-10-CM

## 2022-12-24 DIAGNOSIS — I447 Left bundle-branch block, unspecified: Secondary | ICD-10-CM

## 2022-12-24 DIAGNOSIS — I6523 Occlusion and stenosis of bilateral carotid arteries: Secondary | ICD-10-CM

## 2022-12-24 DIAGNOSIS — I5189 Other ill-defined heart diseases: Secondary | ICD-10-CM | POA: Diagnosis not present

## 2022-12-24 DIAGNOSIS — I2510 Atherosclerotic heart disease of native coronary artery without angina pectoris: Secondary | ICD-10-CM

## 2022-12-24 DIAGNOSIS — I480 Paroxysmal atrial fibrillation: Secondary | ICD-10-CM | POA: Diagnosis not present

## 2022-12-24 DIAGNOSIS — I10 Essential (primary) hypertension: Secondary | ICD-10-CM | POA: Diagnosis not present

## 2022-12-24 MED ORDER — METOPROLOL SUCCINATE ER 25 MG PO TB24: 37.5000 mg | ORAL_TABLET | Freq: Every day | ORAL | 3 refills | Status: DC

## 2022-12-24 NOTE — Patient Instructions (Signed)
Medication Instructions:  Your physician recommends the following medication changes.  START TAKING: Toprol XL 37.5 mg daily  *If you need a refill on your cardiac medications before your next appointment, please call your pharmacy*   Lab Work: None ordered  Follow-Up: At [facility], you and your health needs are our priority.  As part of our continuing mission to provide you with exceptional heart care, we have created designated Provider Care Teams.  These Care Teams include your primary Cardiologist (physician) and Advanced Practice Providers (APPs -  Physician Assistants and Nurse Practitioners) who all work together to provide you with the care you need, when you need it.  We recommend signing up for the patient portal called "MyChart".  Sign up information is provided on this After Visit Summary.  MyChart is used to connect with patients for Virtual Visits (Telemedicine).  Patients are able to view lab/test results, encounter notes, upcoming appointments, etc.  Non-urgent messages can be sent to your provider as well.   To learn more about what you can do with MyChart, go to [email or web address].    Your next appointment:   2 month(s)  Provider:   You may see Julien Nordmann, MD or one of the following Advanced Practice Providers on your designated Care Team:   Eula Listen, [place]

## 2022-12-25 ENCOUNTER — Other Ambulatory Visit: Payer: Self-pay | Admitting: Cardiovascular Disease

## 2023-01-01 DIAGNOSIS — M810 Age-related osteoporosis without current pathological fracture: Secondary | ICD-10-CM | POA: Diagnosis not present

## 2023-01-09 DIAGNOSIS — R7309 Other abnormal glucose: Secondary | ICD-10-CM | POA: Diagnosis not present

## 2023-01-09 DIAGNOSIS — M792 Neuralgia and neuritis, unspecified: Secondary | ICD-10-CM | POA: Diagnosis not present

## 2023-01-09 DIAGNOSIS — R2689 Other abnormalities of gait and mobility: Secondary | ICD-10-CM | POA: Diagnosis not present

## 2023-01-14 ENCOUNTER — Other Ambulatory Visit: Payer: Self-pay | Admitting: Neurology

## 2023-01-14 DIAGNOSIS — R2689 Other abnormalities of gait and mobility: Secondary | ICD-10-CM

## 2023-01-15 NOTE — Progress Notes (Signed)
Name: Kristie Walters   MRN: [id number]    DOB: 1945-04-18   Date:01/22/2023       Progress Note  Subjective  Chief Complaint  Chief Complaint  Patient presents with   Medical Management of Chronic Issues    HPI  Discussed the use of AI scribe software for clinical note transcription with the patient, who gave verbal consent to proceed.  History of Present Illness   The patient, with a history of COPD, complex sclerosing lesion of the left breast, osteoporosis, and paroxysmal atrial fibrillation, presents for a follow-up visit. The patient reports no changes or discomfort related to the breast lesion, which is currently under surveillance with a plan for a six-month follow-up. She has been performing self-examinations as advised by her surgeon.  The patient has been on Prolia for osteoporosis, which was restarted after a period of discontinuation due to pain. She reports tolerating the medication well this time. The patient's bone density results showed significant osteoporosis with a T-score of -2.9 at the hip and -3.7 at the wrist.  The patient's paroxysmal atrial fibrillation and PSVT  has been causing palpitations, which were found to be more frequent than initially realized after wearing a heart monitor. This led to an adjustment in her metoprolol dosage. The patient denies any chest pain and notes that the palpitations often occur during periods of rest or at night.  The patient also reports issues with balance, which her neurologist attributes to neuropathy. She has a lack of sensation in both feet and is scheduled for a nerve conduction study and an MR brainI. The patient has been taking Lyrica (pregabalin) for the neuropathy, but reports significant pain in her feet, particularly at night., currently only takes 50 mg at night   The patient also reports hair loss and sinus pressure, with occasional bloody nasal discharge. She has been managing the sinus pressure with over-the-counter   saline spray as needed. The patient denies any fever, chills, or significant nasal discharge.  The patient's COPD is managed with Trelegy, and she has been avoiding the use of a rescue inhaler due to concerns about heart palpitations, pulmonologist switched her from Albuterol to xopenex due to that concern. The patient's other medications include atorvastatin and Zetia for aortic atherosclerosis, Norvasc for blood pressure control, and Eliquis for atrial fibrillation.         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   Fatty liver 09/29/2017   Coronary artery disease 09/29/2017   Centrilobular emphysema (HCC) 09/29/2017   Basal cell carcinoma of nose 08/12/2017   Estrogen deficiency 08/01/2017   Aortic atherosclerosis (HCC) 09/14/2015   Personal history of tobacco use, presenting hazards to health 08/31/2015   Paresthesia of foot, bilateral 08/15/2015   Breast mass, right 07/12/2015   Elevated alkaline phosphatase level 05/29/2015   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   Gastroesophageal reflux disease without esophagitis 04/26/2015   Hyperlipidemia 04/26/2015   Hyperglycemia 04/26/2015   Carotid stenosis 08/25/2014   H/O malignant neoplasm of skin  08/13/2012    Past Surgical History:  Procedure Laterality Date   BREAST BIOPSY Left 11/27/2022   stereo bx/ x clip/ path pending   BREAST BIOPSY Left 11/27/2022   MM LT BREAST BX W LOC DEV 1ST LESION IMAGE BX SPEC STEREO GUIDE 11/27/2022  [facility]   COLONOSCOPY WITH PROPOFOL N/A 08/22/2020   Procedure: COLONOSCOPY WITH PROPOFOL;  Surgeon: Midge Minium, MD;  Location: [facility] ENDOSCOPY;  Service: Endoscopy;  Laterality: N/A;   ENDARTERECTOMY Left 08/25/2014   Procedure: ENDARTERECTOMY CAROTID;  Surgeon: Annice Needy, MD;  Location: [facility] ORS;  Service: Vascular;  Laterality: Left;   MOHS SURGERY     MOHS SURGERY  09/23/2017   nose   PERIPHERAL VASCULAR CATHETERIZATION N/A 07/20/2014   Procedure: Carotid Angiography;  Surgeon: Annice Needy, MD;  Location: [facility] INVASIVE CV LAB;  Service: Cardiovascular;  Laterality: N/A;   TONSILLECTOMY     TUBAL LIGATION      Family History  Problem Relation Age of Onset   Heart attack Mother    Hypercholesterolemia Mother    Hypertension Mother    Peripheral vascular disease Mother    Dementia Mother    Hypothyroidism Mother    CVA Father    Liver cancer Father    Diabetes Brother    Kidney cancer Sister    Diabetes Brother    Alzheimer's disease Brother    Other Brother        alzheimers   Lymphoma Son    HIV Son    Breast cancer Neg Hx     Social History   Tobacco Use   Smoking status: Former    Current packs/day: 0.00    Average packs/day: 1 pack/day for 55.0 years (55.0 ttl pk-yrs)    Types: Cigarettes    Start date: 11/26/1961    Quit date: 11/26/2016    Years since quitting: 6.1    Passive exposure: Past   Smokeless tobacco: Former    Types: Snuff   Tobacco comments:    smoking cessation materials not required  Substance Use Topics   Alcohol use: No     Current Outpatient Medications:    amLODipine (NORVASC) 5 MG tablet, Take 1 tablet (5 mg total) by mouth as needed (AS NEEDED for SBP (top blood pressure number) greater than 140)., Disp: 90 tablet, Rfl: 3   apixaban (ELIQUIS) 5 MG TABS tablet, Take 1 tablet (5 mg total) by mouth 2 (two) times daily., Disp: 180 tablet, Rfl: 3   aspirin 81 MG tablet, Take 81 mg by mouth daily., Disp: , Rfl:    atorvastatin  (LIPITOR) 40 MG tablet, Take 1 tablet (40 mg total) by mouth daily., Disp: 30 tablet, Rfl: 3   calcium carbonate (OS-CAL - DOSED IN MG OF ELEMENTAL CALCIUM) 1250 (500 Ca) MG tablet, Take 1 tablet by mouth., Disp: , Rfl:    cholecalciferol (VITAMIN D) 1000 units tablet, Take 1,000 Units by mouth daily., Disp: , Rfl:    Cyanocobalamin (VITAMIN B-12) 5000 MCG SUBL, Place under the tongue., Disp: , Rfl:    ezetimibe (ZETIA) 10 MG tablet, TAKE 1 TABLET BY MOUTH EVERY DAY, Disp: 90 tablet, Rfl: 1   Fish Oil-Cholecalciferol (FISH OIL + D3 PO), Take 1,000 mg by mouth 1 day or 1 dose., Disp: , Rfl:    Fluticasone-Umeclidin-Vilant (TRELEGY ELLIPTA) 100-62.5-25 MCG/ACT AEPB, INHALE 1 PUFF BY MOUTH EVERY DAY, Disp: 60 each, Rfl: 6   levalbuterol (XOPENEX HFA) 45 MCG/ACT inhaler, Inhale 1-2 puffs into the lungs  every 8 (eight) hours as needed for wheezing or shortness of breath., Disp: 1 each, Rfl: 2   metoprolol succinate (TOPROL-XL) 25 MG 24 hr tablet, Take 1.5 tablets (37.5 mg total) by mouth daily., Disp: 90 tablet, Rfl: 3   metoprolol tartrate (LOPRESSOR) 25 MG tablet, Take 1 tablet (25 mg total) by mouth 2 (two) times daily as needed (As needed for palpitations)., Disp: 180 tablet, Rfl: 3   omeprazole (PRILOSEC) 40 MG capsule, TAKE 1 CAPSULE (40 MG TOTAL) BY MOUTH AS NEEDED, Disp: 90 capsule, Rfl: 0   pregabalin (LYRICA) 50 MG capsule, Take 1 capsule (50 mg total) by mouth 3 (three) times daily., Disp: 270 capsule, Rfl: 1  Allergies  Allergen Reactions   Morphine Nausea Only and Nausea And Vomiting   Morphine And Codeine Nausea And Vomiting   Zoster Vaccine Live Itching and Rash    I personally reviewed active problem list, medication list, allergies, family history with the patient/caregiver today.   ROS Ten systems reviewed and is negative except as mentioned in HPI    Objective  Vitals:   01/22/23 0959  BP: 124/74  Pulse: 78  Resp: 16  Temp: 97.6 F (36.4 C)  TempSrc: Oral  SpO2: 95%   Weight: 169 lb 14.4 oz (77.1 kg)  Height: 5\' 11"  (1.803 m)    Body mass index is 23.7 kg/m.  Physical Exam  Constitutional: Patient appears well-developed and well-nourished.  No distress.  HEENT: head atraumatic, normocephalic, pupils equal and reactive to light, neck supple Cardiovascular: Normal rate, regular rhythm and normal heart sounds.  No murmur heard. No BLE edema. Pulmonary/Chest: Effort normal and breath sounds normal. No respiratory distress. Abdominal: Soft.  There is no tenderness. Psychiatric: Patient has a normal mood and affect. behavior is normal. Judgment and thought content normal.   Recent Results (from the past 2160 hour(s))  ECHOCARDIOGRAM COMPLETE     Status: None   Collection Time: 11/14/22 11:30 AM  Result Value Ref Range   S' Lateral 3.20 cm   Area-P 1/2 3.31 cm2   Est EF 60 - 65%   Surgical pathology     Status: None   Collection Time: 11/27/22 12:00 AM  Result Value Ref Range   SURGICAL PATHOLOGY      SURGICAL PATHOLOGY [facility] [address] [place], [place] [address] Telephone [phone] or [phone] Fax [phone]  REPORT OF SURGICAL PATHOLOGY   Accession #: [id number] Patient Name: JENNALYN, POZO Visit # : [id number]  MRN: [id number] Physician: Sherron Ales DOB/Age [birth year] (Age: 24) Gender: F Collected Date: 11/27/2022 Received Date: 11/27/2022  FINAL DIAGNOSIS       1. Breast, left, needle core biopsy,  :       - COMPLEX SCLEROSING LESION WITH USUAL DUCTAL HYPERPLASIA (UDH)       DATE SIGNED OUT: 11/28/2022 ELECTRONIC SIGNATURE : Kanteti M.D., Dossie Arbour., Pathologist, Electronic Signature  MICROSCOPIC DESCRIPTION  CASE COMMENTS STAINS USED IN DIAGNOSIS: H&E-2 H&E-3 H&E-4 H&E H&E-2 H&E-3 H&E-4 H&E H&E-2 H&E-3 H&E-4 H&E H&E-2 H&E-3 H&E-4 H&E H&E-2 H&E-3 H&E-4 H&E    CLINICAL HISTORY  SPECIMEN(S) OBTAINED 1. Breast, left, needle core biopsy,  SPECIMEN  COMMENTS: 1. TIF: 13:17,  CIT: < 5 min SPECIMEN CLINICAL INFORMATION: 1. Left breast upper outer distortion, Fibrosis? CSL? R/O [facility]    Gross Description 1. Received in formalin labeled with the patient's name and "left breast distortion" are multiple fragments of fibroadipose tissue measuring 4.5 x 3.0 x 0.2  cm in aggregate.  The specimen is entirely submitted in five blocks.      Time in formalin 1317, CIT less than 5 minutes.   (KW:kh 11/28/22)        Report signed out from the following location(s) [place] [address], [place] [address] CLIA #: [id number]  [facility] [address] [place], [place] [address] CLIA #: [id number]       PHQ2/9:    01/22/2023    9:59 AM 10/24/2022    9:13 AM 08/08/2022   10:46 AM 12/12/2021   10:26 AM 08/15/2021   10:08 AM  Depression screen PHQ 2/9  Decreased Interest 0 0 0 0 0  Down, Depressed, Hopeless 0 0 0 1 0  PHQ - 2 Score 0 0 0 1 0  Altered sleeping 0 0  1   Tired, decreased energy 0 0  0   Change in appetite 0 0  0   Feeling bad or failure about yourself  0 0  0   Trouble concentrating 0 0  0   Moving slowly or fidgety/restless 0 0  0   Suicidal thoughts 0 0  0   PHQ-9 Score 0 0  2   Difficult doing work/chores Not difficult at all   Not difficult at all     phq 9 is negative   Fall Risk:    01/22/2023    9:58 AM 10/24/2022    9:13 AM 08/08/2022   10:42 AM 12/12/2021   10:24 AM 08/15/2021   10:07 AM  Fall Risk   Falls in the past year? 0 0 0 0 0  Number falls in past yr: 0 0 0  0  Injury with Fall? 0 0 0  0  Risk for fall due to : No Fall Risks No Fall Risks No Fall Risks Impaired balance/gait Impaired balance/gait  Follow up Falls prevention discussed;Education provided;Falls evaluation completed Falls prevention discussed Education provided;Falls prevention discussed Falls prevention discussed;Education provided;Falls evaluation completed Falls prevention discussed      Functional Status Survey: Is the patient deaf or have difficulty hearing?: Yes Does the patient have difficulty seeing, even when wearing glasses/contacts?: Yes Does the patient have difficulty concentrating, remembering, or making decisions?: No Does the patient have difficulty walking or climbing stairs?: Yes Does the patient have difficulty dressing or bathing?: No Does the patient have difficulty doing errands alone such as visiting a doctor's office or shopping?: No    Assessment & Plan  Assessment and Plan    Complex Sclerosing Lesion of the Left Breast Stable lesion with no current symptoms. Surgery deferred due to patient's age, COPD, and anticoagulation therapy. Plan for reevaluation in 6 months. -Continue self-exams and monitor for changes.  Osteoporosis Severe osteoporosis with T-scores of -2.9 at the hip and -3.7 at the wrist. Patient restarted on Prolia with good tolerance. -Continue Prolia injections every 6 months.  Paroxysmal Atrial Fibrillation and PSVT Recent increase in palpitations, leading to an increase in Metoprolol dosage. No chest pain reported. -Continue adjusted Metoprolol dosage. -Follow-up with cardiologist in January.  Peripheral Neuropathy Increased symptoms at night, causing sleep disturbances. Currently on Pregabalin 50mg . -Increase Pregabalin to 100mg  at night, with the option to increase to 150mg  if symptoms persist.  Chronic Obstructive Pulmonary Disease (COPD) Stable with no recent exacerbations. Albuterol discontinued due to palpitations, now on Xopenex and Trelegy. -Continue current management and monitor symptoms.  Carotid Artery Disease Stable disease being monitored by  a vascular surgeon. -Continue current management and follow-up with vascular surgeon as scheduled.  Prediabetes Recent A1C of 5.9. Patient reports high sugar intake. -Advise patient to reduce sugar intake to prevent progression to diabetes.  Sinus  Pressure Recent onset of sinus pressure and bloody nasal discharge. No fever or chills. -Try over-the-counter Loratadine and saline nasal spray. Avoid nasal decongestants due to risk of nosebleeds.  Hair Loss Patient reports significant hair loss. Possible side effect of Metoprolol or due to aging. -No change in medication at this time. Monitor for changes.  General Health Maintenance / Followup Plans -Continue current medications including Atorvastatin, Zetia, Norvasc, and Eliquis. -Complete scheduled MRI and nerve conduction study. -Repeat mammogram in 6 months. -Complete lung cancer screening on December 30th. -All vaccinations are up to date. -Follow-up in 4-5 months to assess response to increased Pregabalin dosage.

## 2023-01-21 ENCOUNTER — Encounter: Payer: Self-pay | Admitting: Pulmonary Disease

## 2023-01-21 ENCOUNTER — Ambulatory Visit: Payer: 59 | Admitting: Pulmonary Disease

## 2023-01-21 VITALS — BP 128/78 | HR 65 | Temp 97.8°F | Ht 71.0 in | Wt 170.6 lb

## 2023-01-21 DIAGNOSIS — J4489 Other specified chronic obstructive pulmonary disease: Secondary | ICD-10-CM | POA: Diagnosis not present

## 2023-01-21 DIAGNOSIS — J439 Emphysema, unspecified: Secondary | ICD-10-CM

## 2023-01-21 DIAGNOSIS — Z87891 Personal history of nicotine dependence: Secondary | ICD-10-CM

## 2023-01-21 DIAGNOSIS — R0602 Shortness of breath: Secondary | ICD-10-CM

## 2023-01-21 DIAGNOSIS — R911 Solitary pulmonary nodule: Secondary | ICD-10-CM

## 2023-01-21 DIAGNOSIS — R002 Palpitations: Secondary | ICD-10-CM

## 2023-01-21 MED ORDER — LEVALBUTEROL TARTRATE 45 MCG/ACT IN AERO
1.0000 | INHALATION_SPRAY | Freq: Three times a day (TID) | RESPIRATORY_TRACT | 2 refills | Status: DC | PRN
Start: 2023-01-21 — End: 2023-10-08

## 2023-01-21 NOTE — Progress Notes (Signed)
Subjective:    Patient ID: Kristie Walters, female    DOB: 11/05/1945, 77 y.o.   MRN: [id number]  Patient Care Team: Alba Cory, MD as PCP - General (Family Medicine) Antonieta Iba, MD as PCP - Cardiology (Cardiology) Wyn Quaker Marlow Baars, MD as Consulting Physician (Vascular Surgery) Abbie Sons, MD as Referring Physician (Dermatology) Antonieta Iba, MD as Consulting Physician (Cardiology)  Chief Complaint  Patient presents with   Follow-up    SOB with exertion and at night. No cough. Occasional wheezing at night.    BACKGROUND/INTERVAL:77 year old former smoker (quit 2018) with a 55-pack-year history of smoking presents for follow-up of shortness of breath and emphysema.   HPI Discussed the use of AI scribe software for clinical note transcription with the patient, who gave verbal consent to proceed.  History of Present Illness   The patient, with a known history of COPD and PAF/PSVT, reports experiencing palpitations, which have recently led to a medication adjustment by her cardiologist. She expresses fear of using her rescue inhaler due to these palpitations. The patient's respiratory symptoms seem to worsen at night, characterized by noises in her chest (wheezing) when she goes to bed. She has not used her rescue inhaler due to fear of exacerbating her heart condition and potentially requiring hospitalization.  The patient also mentions an upcoming lung cancer screening, which will be her last through a specific program due to aging out. She has previously undergone such screenings; they have been LUNG RADS 2. The patient's heart doctor has expressed concern about her shortness of breath, unsure of whether it is caused by her heart condition or her lungs.  The patient is a non-smoker and has been proactive about receiving her flu shot, COVID booster, and RSV. She is due for a lifetime pneumonia shot at her next visit. The patient is currently on Trelegy for her respiratory  issues.     DATA: 03/26/2019 2D echo: G2 DD, mild elevation of pulmonary artery systolic pressure, LVEF 55 to 40% 09/16/2019 low-dose lung cancer screening CT: Multiple small inflammatory groundglass nodules, mild diffuse bronchial wall thickening with mild centrilobular and paraseptal emphysema, no evidence of fibrosis 03/28/2020 PFTs: FEV1 1.68 L or 59% predicted, FVC 2.06 L or 54% predicted, FEV1/FVC 82%, no bronchodilator response.  Lung volumes moderately reduced with ERV at 14% consistent with obesity.  Diffusion capacity moderately reduced and corrects for alveolar volume. 02/14/2021 LDCT: Lung RADS 2S, small solid and subsolid pulmonary nodules scattered throughout the lungs similar in size, number and distribution to prior study.  Largest is on the left lower lobe subsolid 10.9 mm in size with no change.  Extensive bilateral apical nodular pleural-parenchymal thickening and architectural distortion similar to prior.  Review of Systems A 10 point review of systems was performed and it is as noted above otherwise negative.   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   Fatty liver 09/29/2017   Coronary artery disease 09/29/2017   Centrilobular emphysema (HCC) 09/29/2017   Basal cell carcinoma of nose 08/12/2017  Estrogen deficiency 08/01/2017   Aortic atherosclerosis (HCC) 09/14/2015   Personal history of tobacco use, presenting hazards to health 08/31/2015   Paresthesia of foot, bilateral 08/15/2015   Breast mass, right  07/12/2015   Elevated alkaline phosphatase level 05/29/2015   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   Gastroesophageal reflux disease without esophagitis 04/26/2015   Hyperlipidemia 04/26/2015   Hyperglycemia 04/26/2015   Carotid stenosis 08/25/2014   H/O malignant neoplasm of skin 08/13/2012    Social History   Tobacco Use   Smoking status: Former    Current packs/day: 0.00    Average packs/day: 1 pack/day for 55.0 years (55.0 ttl pk-yrs)    Types: Cigarettes    Start date: 11/26/1961    Quit date: 11/26/2016    Years since quitting: 6.1    Passive exposure: Past   Smokeless tobacco: Former    Types: Snuff   Tobacco comments:    smoking cessation materials not required  Substance Use Topics   Alcohol use: No    Allergies  Allergen Reactions   Morphine Nausea Only and Nausea And Vomiting   Morphine And Codeine Nausea And Vomiting   Zoster Vaccine Live Itching and Rash    Current Meds  Medication Sig   albuterol (VENTOLIN HFA) 108 (90 Base) MCG/ACT inhaler Inhale 2 puffs into the lungs every 6 (six) hours as needed for wheezing or shortness of breath.   amLODipine (NORVASC) 5 MG tablet Take 1 tablet (5 mg total) by mouth as needed (AS NEEDED for SBP (top blood pressure number) greater than 140).   apixaban (ELIQUIS) 5 MG TABS tablet Take 1 tablet (5 mg total) by mouth 2 (two) times daily.   aspirin 81 MG tablet Take 81 mg by mouth daily.   atorvastatin (LIPITOR) 40 MG tablet Take 1 tablet (40 mg total) by mouth daily.   calcium carbonate (OS-CAL - DOSED IN MG OF ELEMENTAL CALCIUM) 1250 (500 Ca) MG tablet Take 1 tablet by mouth.   cholecalciferol (VITAMIN D) 1000 units tablet Take 1,000 Units by mouth daily.   Cyanocobalamin (VITAMIN B-12) 5000 MCG SUBL Place under the tongue.   ezetimibe (ZETIA) 10 MG tablet TAKE 1 TABLET BY MOUTH EVERY DAY   Fish Oil-Cholecalciferol (FISH OIL + D3 PO) Take 1,000 mg by mouth 1 day or 1 dose.    Fluticasone-Umeclidin-Vilant (TRELEGY ELLIPTA) 100-62.5-25 MCG/ACT AEPB INHALE 1 PUFF BY MOUTH EVERY DAY   metoprolol succinate (TOPROL-XL) 25 MG 24 hr tablet Take 1.5 tablets (37.5 mg total) by mouth daily.   metoprolol tartrate (LOPRESSOR) 25 MG tablet Take 1 tablet (25 mg total) by mouth 2 (two) times daily as needed (As needed for palpitations).   omeprazole (PRILOSEC) 40 MG capsule TAKE 1 CAPSULE (40 MG TOTAL) BY MOUTH AS NEEDED   pregabalin (LYRICA) 50 MG capsule Take 1 capsule (50 mg total) by mouth 3 (three) times daily.    Immunization History  Administered Date(s) Administered   Fluad Quad(high Dose 65+) 11/03/2021, 10/24/2022   Influenza, High Dose Seasonal PF 10/08/2016, 10/01/2018, 10/08/2019, 11/01/2020   Influenza-Unspecified 11/19/2014, 11/16/2015, 10/08/2016, 09/18/2017   Moderna Covid-19 Vaccine Bivalent Booster 62yrs & up 11/15/2020, 12/05/2021   Moderna Sars-Covid-2 Vaccination 01/05/2020, 07/27/2020   PFIZER(Purple Top)SARS-COV-2 Vaccination 04/21/2019, 05/12/2019   Pneumococcal Conjugate-13 11/18/2016   Pneumococcal Polysaccharide-23 06/11/2012, 06/01/2020   Td 02/18/2009   Td (Adult), 2 Lf Tetanus Toxid, Preservative Free 02/18/2009   Tdap 04/25/2021   Zoster Recombinant(Shingrix) 04/25/2021      Objective:  BP 128/78 (BP Location: Right Arm, Cuff Size: Normal)   Pulse 65   Temp 97.8 F (36.6 C)   Ht 5\' 11"  (1.803 m)   Wt 170 lb 9.6 oz (77.4 kg)   SpO2 96%   BMI 23.79 kg/m   SpO2: 96 % O2 Device: None (Room air)  GENERAL: Well-developed well-nourished woman in no acute distress.  She ambulates with assistance of a cane. HEAD: Normocephalic, atraumatic.  EYES: Pupils equal, round, reactive to light.  No scleral icterus.  MOUTH: Upper dentures, oral mucosa moist.  No thrush. NECK: Supple. No thyromegaly. Trachea midline. No JVD.  No adenopathy. PULMONARY: Good air entry bilaterally.  Coarse breath sounds otherwise no adventitious  sounds. CARDIOVASCULAR: S1 and S2. Regular rate and rhythm.  No rubs, murmurs or gallops heard. ABDOMEN: Benign. MUSCULOSKELETAL: No joint deformity, no clubbing, no edema.  NEUROLOGIC: No focal deficit noted, speech is fluent.  Gait steady with assistance (cane). SKIN: Intact,warm,dry. PSYCH: Normal mood, normal behavior.   Assessment & Plan:     ICD-10-CM   1. COPD with chronic bronchitis and emphysema (HCC)  J44.89 Pulmonary Function Test [facility] Only   J43.9     2. Lung nodule  R91.1     3. Personal history of tobacco use, presenting hazards to health  Z87.891     4. Shortness of breath  R06.02 Pulmonary Function Test [facility] Only     Orders Placed This Encounter  Procedures   Pulmonary Function Test [facility] Only    Standing Status:   Future    Standing Expiration Date:   01/21/2024    Order Specific Question:   Full PFT: includes the following: basic spirometry, spirometry pre & post bronchodilator, diffusion capacity (DLCO), lung volumes    Answer:   Full PFT    Order Specific Question:   This test can only be performed at    Answer:   [facility]   Meds ordered this encounter  Medications   levalbuterol (XOPENEX HFA) 45 MCG/ACT inhaler    Sig: Inhale 1-2 puffs into the lungs every 8 (eight) hours as needed for wheezing or shortness of breath.    Dispense:  1 each    Refill:  2   Discussion:    Chronic Obstructive Pulmonary Disease (COPD) Reports intermittent nocturnal dyspnea, currently managed with Trelegy. Expresses concern about heart palpitations with current rescue inhaler. Discussed switching to Xopenex, which may cause fewer palpitations and is covered by insurance. - Prescribe Xopenex as a rescue inhaler - Instruct to start with one puff during the day to monitor for palpitations - Reassess pulmonary function with PFTs  Heart PalpitationsIncreased palpitations noted. Recent adjustment of cardiac medication by cardiology PA, Eula Listen, who is evaluating the  interplay of cardiac and pulmonary issues. - Coordinate care with PA Dunn  Lung Cancer Screening Due for final lung cancer screening at the end of the month. Previous screenings Lung Rads 2. - Complete lung cancer screening on February 17, 2023  General Health Maintenance Received flu shot, COVID booster, and RSV vaccine. Due for lifetime pneumonia vaccine. - Administer pneumonia vaccine at next visit  Follow-up - Schedule follow-up appointment in four months.      Gailen Shelter, MD Advanced Bronch[facility]    *This note was generated using voice recognition software/Dragon and/or AI transcription program.  Despite best efforts to proofread, errors can occur which can change the meaning. Any transcriptional errors that result from this process are unintentional and may not  be fully corrected at the time of dictation.

## 2023-01-21 NOTE — Patient Instructions (Addendum)
VISIT SUMMARY:  During today's visit, we discussed your recent palpitations and concerns about using your rescue inhaler due to your heart condition. We also reviewed your respiratory symptoms, upcoming lung cancer screening, and general health maintenance. Adjustments were made to your medication to better manage your symptoms.  YOUR PLAN:  -CHRONIC OBSTRUCTIVE PULMONARY DISEASE (COPD): COPD is a chronic lung condition that makes it hard to breathe. You reported worsening symptoms at night and concerns about your current rescue inhaler causing palpitations. We have prescribed Xopenex as your new rescue inhaler, which may cause fewer palpitations. Please start with one puff during the day to monitor for any side effects. We will reassess your lung function with breathing test prior to your next visit.  -HEART PALPITATIONS: Heart palpitations are feelings of having a fast-beating, fluttering, or pounding heart.  Your cardiology provider, Eula Listen, PA-C, recently adjusted your heart medication. We will coordinate with Mr. Shea Evans to manage the interplay between your heart and lung conditions.  -LUNG CANCER SCREENING: Lung cancer screening is a test to look for signs of lung cancer before symptoms appear. You are due for your final screening on February 17, 2023. Previous screenings have been without significant findings.  -GENERAL HEALTH MAINTENANCE: You have received your flu shot, COVID booster, and RSV vaccine. You are due for your lifetime pneumonia vaccine, which we will administer at your next visit.  INSTRUCTIONS:  Please schedule a follow-up appointment in four months. Complete your lung cancer screening on February 17, 2023. Start using Xopenex as your new rescue inhaler and monitor for any side effects. We will reassess your lung function with spirometry at your next visit.

## 2023-01-22 ENCOUNTER — Ambulatory Visit (INDEPENDENT_AMBULATORY_CARE_PROVIDER_SITE_OTHER): Payer: 59 | Admitting: Family Medicine

## 2023-01-22 ENCOUNTER — Encounter: Payer: Self-pay | Admitting: Family Medicine

## 2023-01-22 VITALS — BP 124/74 | HR 78 | Temp 97.6°F | Resp 16 | Ht 71.0 in | Wt 169.9 lb

## 2023-01-22 DIAGNOSIS — I25119 Atherosclerotic heart disease of native coronary artery with unspecified angina pectoris: Secondary | ICD-10-CM | POA: Diagnosis not present

## 2023-01-22 DIAGNOSIS — J3489 Other specified disorders of nose and nasal sinuses: Secondary | ICD-10-CM

## 2023-01-22 DIAGNOSIS — I700 Atherosclerosis of aorta: Secondary | ICD-10-CM | POA: Diagnosis not present

## 2023-01-22 DIAGNOSIS — M810 Age-related osteoporosis without current pathological fracture: Secondary | ICD-10-CM | POA: Diagnosis not present

## 2023-01-22 DIAGNOSIS — I6523 Occlusion and stenosis of bilateral carotid arteries: Secondary | ICD-10-CM

## 2023-01-22 DIAGNOSIS — G629 Polyneuropathy, unspecified: Secondary | ICD-10-CM | POA: Diagnosis not present

## 2023-01-22 DIAGNOSIS — I480 Paroxysmal atrial fibrillation: Secondary | ICD-10-CM

## 2023-01-22 DIAGNOSIS — N6489 Other specified disorders of breast: Secondary | ICD-10-CM

## 2023-01-22 DIAGNOSIS — R2689 Other abnormalities of gait and mobility: Secondary | ICD-10-CM | POA: Diagnosis not present

## 2023-01-22 DIAGNOSIS — D692 Other nonthrombocytopenic purpura: Secondary | ICD-10-CM

## 2023-01-22 DIAGNOSIS — I471 Supraventricular tachycardia, unspecified: Secondary | ICD-10-CM

## 2023-01-22 MED ORDER — PREGABALIN 100 MG PO CAPS
100.0000 mg | ORAL_CAPSULE | Freq: Every day | ORAL | 1 refills | Status: DC
Start: 2023-01-22 — End: 2023-05-23

## 2023-01-22 MED ORDER — LORATADINE 10 MG PO TABS
10.0000 mg | ORAL_TABLET | Freq: Every day | ORAL | 11 refills | Status: DC
Start: 2023-01-22 — End: 2023-08-20

## 2023-02-04 DIAGNOSIS — R200 Anesthesia of skin: Secondary | ICD-10-CM | POA: Diagnosis not present

## 2023-02-04 DIAGNOSIS — R202 Paresthesia of skin: Secondary | ICD-10-CM | POA: Diagnosis not present

## 2023-02-06 ENCOUNTER — Ambulatory Visit
Admission: RE | Admit: 2023-02-06 | Discharge: 2023-02-06 | Disposition: A | Discharge status: Home / Self Care | Payer: 59 | Source: Ambulatory Visit | Attending: Neurology | Admitting: Neurology

## 2023-02-06 DIAGNOSIS — R2681 Unsteadiness on feet: Secondary | ICD-10-CM | POA: Diagnosis not present

## 2023-02-06 DIAGNOSIS — R2689 Other abnormalities of gait and mobility: Secondary | ICD-10-CM

## 2023-02-13 ENCOUNTER — Other Ambulatory Visit: Payer: Self-pay | Admitting: Physician Assistant

## 2023-02-17 ENCOUNTER — Ambulatory Visit
Admission: RE | Admit: 2023-02-17 | Discharge: 2023-02-17 | Disposition: A | Discharge status: Home / Self Care | Payer: 59 | Source: Ambulatory Visit | Attending: Acute Care | Admitting: Acute Care

## 2023-02-17 DIAGNOSIS — Z87891 Personal history of nicotine dependence: Secondary | ICD-10-CM | POA: Diagnosis not present

## 2023-02-17 DIAGNOSIS — R911 Solitary pulmonary nodule: Secondary | ICD-10-CM | POA: Insufficient documentation

## 2023-02-28 ENCOUNTER — Encounter: Payer: Self-pay | Admitting: Physician Assistant

## 2023-02-28 ENCOUNTER — Ambulatory Visit: Payer: 59 | Attending: Physician Assistant | Admitting: Physician Assistant

## 2023-02-28 VITALS — BP 138/76 | HR 63 | Ht 71.0 in | Wt 168.0 lb

## 2023-02-28 DIAGNOSIS — I5189 Other ill-defined heart diseases: Secondary | ICD-10-CM

## 2023-02-28 DIAGNOSIS — E785 Hyperlipidemia, unspecified: Secondary | ICD-10-CM | POA: Diagnosis not present

## 2023-02-28 DIAGNOSIS — I2510 Atherosclerotic heart disease of native coronary artery without angina pectoris: Secondary | ICD-10-CM

## 2023-02-28 DIAGNOSIS — I10 Essential (primary) hypertension: Secondary | ICD-10-CM | POA: Diagnosis not present

## 2023-02-28 DIAGNOSIS — I480 Paroxysmal atrial fibrillation: Secondary | ICD-10-CM

## 2023-02-28 DIAGNOSIS — I447 Left bundle-branch block, unspecified: Secondary | ICD-10-CM | POA: Diagnosis not present

## 2023-02-28 DIAGNOSIS — I6523 Occlusion and stenosis of bilateral carotid arteries: Secondary | ICD-10-CM | POA: Diagnosis not present

## 2023-02-28 DIAGNOSIS — I471 Supraventricular tachycardia, unspecified: Secondary | ICD-10-CM

## 2023-02-28 NOTE — Progress Notes (Signed)
 Cardiology Office Note    Date:  02/28/2023   ID:  Kristie Walters, DOB 15-Jun-1945, MRN [id number]  PCP:  Kristie Mire, MD  Cardiologist:  Evalene Lunger, MD  Electrophysiologist:  None   Chief Complaint: Follow up  History of Present Illness:   Kristie Walters is a 78 y.o. 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   She was previously followed by Dr. Florencio though subsequently transitioned to Dr. Gollan in 09/2017.  Prior echo from 09/2015, done by outside cardiology group, showed an EF greater than 55%, mild LVH, normal RV systolic function, mild MR/TR.  Nuclear stress test at that time showed an EF of 77% with no evidence of significant ischemia or scar.  She was referred to [facility] in 09/2017 for evaluation of incidentally noted aortic atherosclerosis and coronary artery calcium  along the LAD on noninvasive imaging.  She was noted to not be very active at baseline secondary to back pain.  She did note some rare palpitations associated with stress that improved with rest.  Aggressive primary prevention was recommended.  She declined stress testing at that time.   She was seen in the ED in 02/2019 with palpitations and noted to be in new onset A. fib with RVR with LBBB (EMS EKG).  She had spontaneously converted to sinus rhythm in the field following IV metoprolol .  She was noted to have a new left bundle which persisted in sinus rhythm.  High-sensitivity troponin negative x2.  She was placed on metoprolol   and Eliquis .  Subsequent echo in 03/2019 showed an EF of 55 to 60%, no regional wall motion abnormalities, grade 2 diastolic dysfunction, normal RV systolic function and ventricular cavity size, normal size left atrium, and mildly elevated PASP.     She was seen on 01/05/2020 noting a 2-week history of increased palpitations as well as some associated dizziness, shortness of breath, and chest discomfort.  Lexiscan  MPI on 01/11/2020 showed no evidence of significant ischemia or scar with an LVEF greater than 65%.  With administration of regadenoson  she developed a LBBB with known intermittent LBBB.  Attenuation corrected CT images noted aortic atherosclerosis with no significant coronary artery calcification.  Overall, this was a low risk study.  Zio patch showed a predominant rhythm of sinus with an average heart rate of 65 bpm.  First-degree AV block was noted.  18 episodes of SVT were noted with the longest interval lasting 10 beats with an average rate of 109 bpm and the fastest interval lasting 4 beats with a maximum rate of 158 bpm.  Isolated PACs, atrial couplets, atrial triplets, and PVCs were noted.  There were 6 patient triggered events and these were not associated with significant arrhythmia.  Echo in 02/2020 showed an EF of 55-60%, no RWMA, Gr1DD, normal RV systolic function and ventricular cavity size, and a PASP of 35.7 mmHg.   She was seen in the office in 5//2023 and did report onset of left-sided palpitations and pinpoint chest discomfort that began after taking metoprolol  titrate from a different manufacturer  with symptoms typically lasting 1 to 2 minutes and spontaneously resolving.  With this, she self discontinued Lopressor  and noted improvement, though not resolution of symptoms.  She was also under increased stress with the health of her son.  We underwent a trial of Toprol -XL 25 mg daily.   She was seen in the office in 07/2021 with chronic stable exertional dyspnea.  Her main complaint at  that time was fatigue that has been present for approximately 1 week.  Upon initiating metoprolol  succinate, she noted resolution of palpitations.  Her son and daughter were doing well following stem cell donor/transplant.  She did not qualify for sleep study based on her STOP-BANG score.   She was seen in the ED on 10/24/2021 with A-fib with RVR with rates ranging from 90 to 170 bpm with associated shortness of breath generalized weakness, fatigue, and chills.  EKG showed A-fib with RVR, 106 bpm, and known LBBB.  High-sensitivity troponin of 13 with a delta troponin of 20.  Chest x-ray showed emphysema with no acute cardiopulmonary process.  She spontaneously converted to sinus rhythm in the ED.   She was seen in ED follow-up on 11/01/2021 and was without further chest discomfort or symptoms of decompensation.  She did note some brief paroxysms of palpitations.  She was maintaining sinus rhythm this.  Coronary CTA on 11/15/2021 demonstrated a calcium  score of 170 which was the 67th percentile with 25 to 49% proximal LAD stenosis noted.   She was seen in the office on 10/23/2022 noting an increase in fatigue, shortness of breath, palpitations, and abdominal bloating.  She was without frank angina.  Zio patch in 10/2022 showed a predominant rhythm of sinus with an average rate of 70 bpm (range 49 to 148 bpm), intermittent bundle branch block, 18 episodes of SVT lasting up to 12.9 seconds, and rare atrial and ventricular ectopy.  Echo on 11/14/2022 showed an EF of 60 to 65%, no regional wall motion abnormalities, grade 1 diastolic dysfunction, normal RV systolic function and ventricular cavity size, mild mitral regurgitation, tricuspid aortic valve, and an estimated right atrial pressure of 3 mmHg.  She was last seen in the office in 12/2022 continuing to report chronic fatigue and dyspnea.  She also noted an increase in palpitations that were more noticeable when resting and trying to sleep.  There was not an  association of palpitations and dyspnea/fatigue.  Palpitations would typically last 1 to 2 seconds and spontaneously resolved.  Toprol -XL was titrated to 37.5 mg daily.  She comes in doing well from a cardiac perspective and is without symptoms of angina or cardiac decompensation.  Chronic dyspnea is stable.  Palpitation burden is stable, noting a rare palpitation that will last 1 to 2 seconds with spontaneous resolution.  She has not needed any as needed metoprolol .  No falls or symptoms concerning for bleeding.  No lower extremity swelling or progressive orthopnea.  Weight stable.  No dizziness, presyncope, or syncope.   Labs independently reviewed: 12/2022 - A1c 5.9 10/2022 - TC 120, TG 199, HDL 34, LDL 53, TSH normal, Hgb 13.5, PLT 229, BUN 12, serum creatinine 0.8, potassium 4.2, albumin 3.9, AST/ALT normal   Past Medical History:  Diagnosis Date   Allergy    Atrial fibrillation (HCC)    Basal cell carcinoma of nose 08/12/2017   Carotid artery disease (HCC)    s/p L. CEA in July 2016   Centrilobular emphysema (HCC) 09/29/2017   Collagen vascular disease (HCC)    Left  carotid stenosis   COPD (chronic obstructive pulmonary disease) (HCC)    Coronary artery disease 09/29/2017   Noted on chest CT July 2019   Dysrhythmia    Elevated blood pressure (not hypertension)    Fatty liver 09/29/2017   Chest CT July 2019   GERD (gastroesophageal reflux disease)    Hyperlipidemia    Personal history of tobacco use, presenting hazards to health 08/31/2015   PONV (postoperative nausea and vomiting)     Past Surgical History:  Procedure Laterality Date   BREAST BIOPSY Left 11/27/2022   stereo bx/ x clip/ path pending   BREAST BIOPSY Left 11/27/2022   MM LT BREAST BX W LOC DEV 1ST LESION IMAGE BX SPEC STEREO GUIDE 11/27/2022 [facility]   COLONOSCOPY WITH PROPOFOL  N/A 08/22/2020   Procedure: COLONOSCOPY WITH PROPOFOL ;  Surgeon: Jinny Carmine, MD;  Location: [facility];  Service:  Endoscopy;  Laterality: N/A;   ENDARTERECTOMY Left 08/25/2014   Procedure: ENDARTERECTOMY CAROTID;  Surgeon: Selinda GORMAN Gu, MD;  Location: [facility] ORS;  Service: Vascular;  Laterality: Left;   MOHS SURGERY     MOHS SURGERY  09/23/2017   nose   PERIPHERAL VASCULAR CATHETERIZATION N/A 07/20/2014   Procedure: Carotid Angiography;  Surgeon: Selinda GORMAN Gu, MD;  Location: [facility] IN[facility] CV [facility];  Service: Cardiovascular;  Laterality: N/A;   TONSILLECTOMY     TUBAL LIGATION      Current Medications: Current Meds  Medication Sig   amLODipine  (NORVASC ) 5 MG tablet Take 1 tablet (5 mg total) by mouth as needed (AS NEEDED for SBP (top blood pressure number) greater than 140).   apixaban  (ELIQUIS ) 5 MG TABS tablet Take 1 tablet (5 mg total) by mouth 2 (two) times daily.   aspirin  81 MG tablet Take 81 mg by mouth daily.   atorvastatin  (LIPITOR) 40 MG tablet TAKE 1 TABLET BY MOUTH EVERY DAY   calcium  carbonate (OS-CAL - DOSED IN MG OF ELEMENTAL CALCIUM ) 1250 (500 Ca) MG tablet Take 1 tablet by mouth.   cholecalciferol (VITAMIN D ) 1000 units tablet Take 1,000 Units by mouth daily.   Cyanocobalamin  (VITAMIN B-12) 5000 MCG SUBL Place under the tongue.   denosumab  (PROLIA ) 60 MG/ML SOSY injection Inject 60 mg into the skin every 6 (six) months.   ezetimibe  (ZETIA ) 10 MG tablet TAKE 1 TABLET BY MOUTH EVERY DAY   Fish Oil-Cholecalciferol (FISH OIL + D3 PO) Take 1,000 mg by mouth 1 day or 1 dose.   Fluticasone -Umeclidin-Vilant (TRELEGY ELLIPTA ) 100-62.5-25 MCG/ACT AEPB INHALE 1 PUFF BY MOUTH EVERY DAY   metoprolol  succinate (TOPROL -XL) 25 MG 24 hr tablet Take 1.5 tablets (37.5 mg total) by mouth daily.   metoprolol  tartrate (LOPRESSOR ) 25 MG tablet Take 1 tablet (25 mg total) by mouth 2 (two) times daily as needed (As needed for palpitations).   omeprazole  (PRILOSEC) 40 MG capsule TAKE 1 CAPSULE (40 MG TOTAL) BY MOUTH AS NEEDED   pregabalin  (LYRICA ) 100 MG capsule Take 1 capsule (100 mg total) by mouth at bedtime.     Allergies:   Morphine , Morphine  and codeine, and Zoster vaccine live   Social History   Socioeconomic History   Marital status: Divorced    Spouse name: Not on file   Number of children: 2   Years of education: Not on file   Highest education level: 10th grade  Occupational History   Occupation: Retired  Tobacco Use   Smoking status: Former    Current packs/day: 0.00    Average packs/day: 1 pack/day  for 55.0 years (55.0 ttl pk-yrs)    Types: Cigarettes    Start date: 11/26/1961    Quit date: 11/26/2016    Years since quitting: 6.2    Passive exposure: Past   Smokeless tobacco: Former    Types: Snuff   Tobacco comments:    smoking cessation materials not required  Vaping Use   Vaping status: Never Used  Substance and Sexual Activity   Alcohol use: No   Drug use: No   Sexual activity: Not Currently  Other Topics Concern   Not on file  Social History Narrative    Pt lives alone   Social Drivers of Health   Financial Resource Strain: Low Risk  (12/23/2022)   Received from [facility]   Overall Financial Resource Strain (CARDIA)    Difficulty of Paying Living Expenses: Not hard at all  Food Insecurity: No Food Insecurity (12/23/2022)   Received [facility]   Hunger Vital Sign    Worried About Running Out of Food in the Last Year: Never true    Ran Out of Food in the Last Year: Never true  Transportation Needs: No Transportation Needs (12/23/2022)   Received from [facility] - Transportation    In the past 12 months, has lack of transportation kept you from medical appointments or from getting medications?: No    Lack of Transportation (Non-Medical): No  Physical Activity: Inactive (08/08/2022)   Exercise Vital Sign    Days of Exercise per Week: 0 days    Minutes of Exercise per Session: 0 min  Stress: Stress Concern Present (08/08/2022)   [employer] - Occupational  Stress Questionnaire    Feeling of Stress : To some extent  Social Connections: Moderately Isolated (08/08/2022)   Social Connection and Isolation Panel [NHANES]    Frequency of Communication with Friends and Family: More than three times a week    Frequency of Social Gatherings with Friends and Family: Three times a week    Attends Religious Services: More than 4 times per year    Active Member of Clubs or Organizations: No    Attends Banker Meetings: Never    Marital Status: Divorced     Family History:  The patient's family history includes Alzheimer's disease in her brother; CVA in her father; Dementia in her mother; Diabetes in her brother and brother; HIV in her son; Heart attack in her mother; Hypercholesterolemia in her mother; Hypertension in her mother; Hypothyroidism in her mother; Kidney cancer in her sister; Liver cancer in her father; Lymphoma in her son; Other in her brother; Peripheral vascular disease in her mother. There is no history of Breast cancer.  ROS:   12-point review of systems is negative unless otherwise noted in the HPI.   EKGs/Labs/Other Studies Reviewed:    Studies reviewed were summarized above. The additional studies were reviewed today:  Zio patch 10/2022: Normal sinus rhythm Patient had a min HR of 49 bpm, max HR of 148 bpm, and avg HR of 70 bpm. Intermittent Bundle Branch Block was present.  18 Supraventricular Tachycardia runs occurred, the run with the fastest interval lasting 12.9 secs with a max rate of 148 bpm, the longest lasting 26 beats with an avg rate of 126 bpm.    Isolated SVEs were rare (<1.0%), SVE Couplets were rare (<1.0%), and SVE Triplets were rare (<1.0%).  Isolated VEs were rare (<1.0%), and no VE Couplets  or VE Triplets were present.   Patient triggered events recorded __________   2D echo 11/14/2022: 1. Left ventricular ejection fraction, by estimation, is 60 to 65%. The  left ventricle has normal function. The  left ventricle has no regional  wall motion abnormalities. Left ventricular diastolic parameters are  consistent with Grade I diastolic  dysfunction (impaired relaxation). The average left ventricular global  longitudinal strain is -19.6 %.   2. Right ventricular systolic function is normal. The right ventricular  size is normal. Tricuspid regurgitation signal is inadequate for assessing  PA pressure.   3. The mitral valve is normal in structure. Mild mitral valve  regurgitation. No evidence of mitral stenosis.   4. The aortic valve is tricuspid. Aortic valve regurgitation is not  visualized. No aortic stenosis is present.   5. The inferior vena cava is normal in size with greater than 50%  respiratory variability, suggesting right atrial pressure of 3 mmHg.  __________   Carotid artery ultrasound 07/31/2022: Summary:  Right Carotid: Velocities in the right ICA are consistent with a 1-39%  stenosis.  Non-hemodynamically significant plaque <50% noted in the CCA.   Left Carotid: Velocities in the left ICA are consistent with a 1-39% stenosis.   Vertebrals: Bilateral vertebral arteries demonstrate antegrade flow.  Subclavians: Normal flow hemodynamics were seen in bilateral subclavian arteries.  __________   Coronary CTA 11/15/2021: FINDINGS: Aorta: Normal size. Aortic root and descending aorta calcifications. No dissection.   Aortic Valve:  Trileaflet.  No calcifications.   Coronary Arteries:  Normal coronary origin.  Right dominance.   RCA is a dominant artery that gives rise to PDA and PLA. There is no plaque.   Left main gives rise to LAD and LCX arteries. There is no LM disease.   LAD has calcified plaque in the proximal segment causing mild stenosis (25-49%).   LCX is a non-dominant artery that gives rise to three obtuse marginal branches. There is no plaque.   Other findings:   Normal pulmonary vein drainage into the left atrium.   Normal left atrial appendage  without a thrombus.   Normal size of the pulmonary artery.   IMPRESSION: 1. Coronary calcium  score of 170. This was 67th percentile for age and sex matched control. 2. Normal coronary origin with right dominance. 3. Calcified plaque in the proximal LAD causing mild stenosis (25-49%) 4. CAD-RADS 2. Mild non-obstructive CAD (25-49%). Consider non-atherosclerotic causes of chest pain. Consider preventive therapy and risk factor modification. __________   2D echo 09/04/2021: 1. Left ventricular ejection fraction, by estimation, is 55 to 60%. The  left ventricle has normal function. The left ventricle has no regional  wall motion abnormalities. Left ventricular diastolic parameters are  consistent with Grade I diastolic  dysfunction (impaired relaxation).   2. Right ventricular systolic function is normal. The right ventricular  size is normal. Tricuspid regurgitation signal is inadequate for assessing  PA pressure.   3. The mitral valve is normal in structure. No evidence of mitral valve  regurgitation. No evidence of mitral stenosis.   4. The aortic valve was not well visualized. Aortic valve regurgitation  is not visualized. No aortic stenosis is present.   5. The inferior vena cava is normal in size with greater than 50%  respiratory variability, suggesting right atrial pressure of 3 mmHg.   Comparison(s): 02/2020-EF 55-60%. __________   Carotid artery ultrasound 07/20/2021: Summary:  Right Carotid: Velocities in the right ICA are consistent with a 1-39% stenosis.   Left  Carotid: Velocities in the left ICA are consistent with a 1-39%  stenosis.   Vertebrals:  Bilateral vertebral arteries demonstrate antegrade flow.  Subclavians: Normal flow hemodynamics were seen in bilateral subclavian arteries. ___________   2D echo 03/09/2020: 1. Left ventricular ejection fraction, by estimation, is 55 to 60%. The  left ventricle has normal function. The left ventricle has no regional   wall motion abnormalities. Left ventricular diastolic parameters are  consistent with Grade I diastolic  dysfunction (impaired relaxation).   2. Right ventricular systolic function is normal. The right ventricular  size is normal. There is normal pulmonary artery systolic pressure. The  estimated right ventricular systolic pressure is 35.7 mmHg.   3. The aortic valve was not well visualized. __________   Lexiscan  MPI 01/11/2020: Normal pharmacologic myocardial perfusion stress test without evidence of significant ischemia or scar. The left ventricular ejection fraction is hyperdynamic (>65%). Left bundle branch block developed after administration of regadenoson . Patient known to have intermittent LBBB. Attenuation correction CT is notable for aortic atherosclerosis. There is no significant coronary artery calcification. This is a low risk study. __________   Zio patch 12/2019: Normal sinus rhythm avg HR of 65 bpm.    Patient triggered events (6) were not associated with significant arrhythmia.   First Degree AV Block was present.  18 Supraventricular Tachycardia runs occurred, the run with the fastest interval lasting 4 beats with a max rate of 158 bpm, the longest lasting 10 beats with an avg rate of 109 bpm.   Isolated SVEs were rare (<1.0%), SVE Couplets were rare (<1.0%), and SVE Triplets were rare (<1.0%). Isolated VEs were rare (<1.0%), and no VE Couplets or VE Triplets were present. __________   2D echo 03/2019: 1. Left ventricular ejection fraction, by visual estimation, is 55 to  60%. The left ventricle has normal function. There is no left ventricular  hypertrophy.   2. Left ventricular diastolic parameters are consistent with Grade II  diastolic dysfunction (pseudonormalization).   3. The left ventricle has no regional wall motion abnormalities.   4. Global right ventricle has normal systolic function.The right  ventricular size is normal. No increase in right  ventricular wall  thickness.   5. Left atrial size was normal.   6. Mildly elevated pulmonary artery systolic pressure.   EKG:  EKG is ordered today.  The EKG ordered today demonstrates NSR, 63 bpm, LBBB  Recent Labs: 10/23/2022: ALT 22; BUN 12; Creatinine, Ser 0.80; Hemoglobin 13.5; Platelets 229; Potassium 4.2; Sodium 142; TSH 4.390  Recent Lipid Panel    Component Value Date/Time   CHOL 120 10/23/2022 1233   TRIG 199 (H) 10/23/2022 1233   HDL 34 (L) 10/23/2022 1233   CHOLHDL 3.5 10/23/2022 1233   CHOLHDL 4.2 04/18/2021 1423   LDLCALC 53 10/23/2022 1233   LDLCALC 79 04/18/2021 1423    PHYSICAL EXAM:    VS:  BP 138/76 (BP Location: Left Arm, Patient Position: Sitting, Cuff Size: Normal)   Pulse 63   Ht 5' 11 (1.803 m)   Wt 168 lb (76.2 kg)   SpO2 97%   BMI 23.43 kg/m   BMI: Body mass index is 23.43 kg/m.  Physical Exam Vitals reviewed.  Constitutional:      Appearance: She is well-developed.  HENT:     Head: Normocephalic and atraumatic.  Eyes:     General:        Right eye: No discharge.        Left eye: No discharge.  Neck:     Vascular: No JVD.  Cardiovascular:     Rate and Rhythm: Normal rate and regular rhythm.     Heart sounds: Normal heart sounds, S1 normal and S2 normal. Heart sounds not distant. No midsystolic click and no opening snap. No murmur heard.    No friction rub.  Pulmonary:     Effort: Pulmonary effort is normal. No respiratory distress.     Breath sounds: Normal breath sounds. No decreased breath sounds, wheezing, rhonchi or rales.  Chest:     Chest wall: No tenderness.  Abdominal:     General: There is no distension.  Musculoskeletal:     Cervical back: Normal range of motion.     Right lower leg: No edema.     Left lower leg: No edema.  Skin:    General: Skin is warm and dry.     Nails: There is no clubbing.  Neurological:     Mental Status: She is alert and oriented to person, place, and time.  Psychiatric:        Speech:  Speech normal.        Behavior: Behavior normal.        Thought Content: Thought content normal.        Judgment: Judgment normal.     Wt Readings from Last 3 Encounters:  02/28/23 168 lb (76.2 kg)  01/22/23 169 lb 14.4 oz (77.1 kg)  01/21/23 170 lb 9.6 oz (77.4 kg)     ASSESSMENT & PLAN:   Nonobstructive CAD involving the native coronary arteries without angina with chronic dyspnea: She is doing well from a cardiac perspective and without symptoms of angina or cardiac decompensation.  Coronary CTA in 10/2021 showed nonobstructive disease involving the LAD of up to 49%.  Continue aggressive risk factor modification including aspirin , atorvastatin , ezetimibe , and metoprolol .  With regards to her dyspnea, this is a stable and that she is undergoing pulmonology evaluation.  Should that be unrevealing, could pursue RHC.  PAF/PSVT: Maintaining sinus rhythm on Toprol -XL 37.5 mg daily.  CHA2DS2-VASc at least 5.  She remains on apixaban  5 mg twice daily and does not meet reduced dosing criteria.  No falls or symptoms concerning for bleeding.  Recent labs stable.  Diastolic dysfunction: Euvolemic and well compensated with grade 1 diastolic dysfunction on most recent echo.  Normal right atrial pressure.  Wearing a standing loop diuretic.  Continue optimal blood pressure control.  Intermittent LBBB: Stable.  HTN: Blood pressure is well-controlled in the office.  She remains on Toprol -XL 37.5 mg and as needed amlodipine .  HLD: LDL 53 in 10/2022 with normal AST/ALT at that time.  She remains on atorvastatin  and ezetimibe .  Carotid artery disease: Status post left-sided CEA in 08/2014.  Stable 1 to 39% bilateral ICA stenoses by ultrasound in 07/2022.  She remains on aspirin  and statin.  Follow-up ultrasound in 07/2023.    Disposition: F/u with Dr. Gollan or an APP in 6 months.   Medication Adjustments/Labs and Tests Ordered: Current medicines are reviewed at length with the patient today.  Concerns  regarding medicines are outlined above. Medication changes, Labs and Tests ordered today are summarized above and listed in the Patient Instructions accessible in Encounters.   Signed, Bernardino Bring, PA-C 02/28/2023 12:58 PM      [facility] [address] [place], [place] [address] [phone]

## 2023-02-28 NOTE — Patient Instructions (Signed)
 Medication Instructions:  Your Physician recommend you continue on your current medication as directed.    *If you need a refill on your cardiac medications before your next appointment, please call your pharmacy*   Lab Work: None ordered at this time  If you have labs (blood work) drawn today and your tests are completely normal, you will receive your results only by: MyChart Message (if you have MyChart) OR A paper copy in the mail If you have any lab test that is abnormal or we need to change your treatment, we will call you to review the results.   Follow-Up: At [facility], you and your health needs are our priority.  As part of our continuing mission to provide you with exceptional heart care, we have created designated Provider Care Teams.  These Care Teams include your primary Cardiologist (physician) and Advanced Practice Providers (APPs -  Physician Assistants and Nurse Practitioners) who all work together to provide you with the care you need, when you need it.  We recommend signing up for the patient portal called MyChart.  Sign up information is provided on this After Visit Summary.  MyChart is used to connect with patients for Virtual Visits (Telemedicine).  Patients are able to view lab/test results, encounter notes, upcoming appointments, etc.  Non-urgent messages can be sent to your provider as well.   To learn more about what you can do with MyChart, go to [email or web address].    Your next appointment:   6 month(s)  Provider:   You may see Timothy Gollan, MD or one of the following Advanced Practice Providers on your designated Care Team:   Lonni Meager, NP Bernardino Bring, PA-C Cadence Franchester, PA-C Tylene Lunch, NP Barnie Hila, NP

## 2023-03-01 ENCOUNTER — Other Ambulatory Visit: Payer: Self-pay | Admitting: Pulmonary Disease

## 2023-03-03 NOTE — Telephone Encounter (Signed)
 Trelegy as mentioned in the HPI and also on the assessment and plan.  It is also on the medication list for the patient.  It is okay to continue.  Trelegy 100, 1 puff daily.

## 2023-03-10 ENCOUNTER — Telehealth: Payer: Self-pay | Admitting: Acute Care

## 2023-03-10 ENCOUNTER — Other Ambulatory Visit: Payer: Self-pay

## 2023-03-10 DIAGNOSIS — Z122 Encounter for screening for malignant neoplasm of respiratory organs: Secondary | ICD-10-CM

## 2023-03-10 DIAGNOSIS — Z87891 Personal history of nicotine dependence: Secondary | ICD-10-CM

## 2023-03-10 DIAGNOSIS — R911 Solitary pulmonary nodule: Secondary | ICD-10-CM

## 2023-03-10 NOTE — Telephone Encounter (Signed)
Spoke with patient and reviewed results. She is in agreement to follow up in 6 months to repeat scan for new nodule and reassess renal lesion.We also discussed emphysema, aortic atherosclerosis an coronary atherosclerosis. She is on statin therapy. We discussed hepatic steatosis and eating a low fat, low cholesterol diet. Keeping her blood sugar and blood pressure under control. CT order placed for 6 month repeat. Results faxed to PCP with plan.

## 2023-03-10 NOTE — Telephone Encounter (Signed)
Patient had a LDCT scan on 02/17/2023 that resulted as a LR2. Patient has a new 9.30mm nodule and a 1.0cm renal lesion. Kandice Robinsons NP has reviewed the scan and would like patient to have a 6 month follow up scan due to new nodule, scan due 08/18/2023. At this time the renal lesion can also be re-evaluated. After this scan the patient will age out of the program if nodule is stable.

## 2023-03-15 ENCOUNTER — Other Ambulatory Visit: Payer: Self-pay | Admitting: Physician Assistant

## 2023-03-17 NOTE — Telephone Encounter (Signed)
Last office visit: 02/28/23 with plan to f/u 6 months. next office visit: none/active recall

## 2023-03-31 DIAGNOSIS — R059 Cough, unspecified: Secondary | ICD-10-CM | POA: Diagnosis not present

## 2023-05-15 ENCOUNTER — Ambulatory Visit: Attending: Pulmonary Disease

## 2023-05-15 DIAGNOSIS — J4489 Other specified chronic obstructive pulmonary disease: Secondary | ICD-10-CM | POA: Diagnosis present

## 2023-05-15 DIAGNOSIS — R0602 Shortness of breath: Secondary | ICD-10-CM | POA: Diagnosis present

## 2023-05-15 DIAGNOSIS — J439 Emphysema, unspecified: Secondary | ICD-10-CM | POA: Insufficient documentation

## 2023-05-15 DIAGNOSIS — Z87891 Personal history of nicotine dependence: Secondary | ICD-10-CM | POA: Diagnosis not present

## 2023-05-15 DIAGNOSIS — Z79899 Other long term (current) drug therapy: Secondary | ICD-10-CM | POA: Diagnosis not present

## 2023-05-15 LAB — PULMONARY FUNCTION TEST ARMC ONLY
DL/VA % pred: 80 %
DL/VA: 3.12 ml/min/mmHg/L
DLCO unc % pred: 50 %
DLCO unc: 12.03 ml/min/mmHg
FEF 25-75 Post: 1.89 L/s
FEF 25-75 Pre: 1.67 L/s
FEF2575-%Change-Post: 13 %
FEF2575-%Pred-Post: 95 %
FEF2575-%Pred-Pre: 84 %
FEV1-%Change-Post: 3 %
FEV1-%Pred-Post: 57 %
FEV1-%Pred-Pre: 55 %
FEV1-Post: 1.58 L
FEV1-Pre: 1.52 L
FEV1FVC-%Change-Post: 0 %
FEV1FVC-%Pred-Pre: 109 %
FEV6-%Change-Post: 2 %
FEV6-%Pred-Post: 55 %
FEV6-%Pred-Pre: 53 %
FEV6-Post: 1.91 L
FEV6-Pre: 1.86 L
FEV6FVC-%Pred-Post: 104 %
FEV6FVC-%Pred-Pre: 104 %
FVC-%Change-Post: 2 %
FVC-%Pred-Post: 52 %
FVC-%Pred-Pre: 51 %
FVC-Post: 1.91 L
FVC-Pre: 1.86 L
Post FEV1/FVC ratio: 82 %
Post FEV6/FVC ratio: 100 %
Pre FEV1/FVC ratio: 82 %
Pre FEV6/FVC Ratio: 100 %
RV % pred: 92 %
RV: 2.49 L
TLC % pred: 78 %
TLC: 4.81 L

## 2023-05-15 MED ORDER — ALBUTEROL SULFATE (2.5 MG/3ML) 0.083% IN NEBU
2.5000 mg | INHALATION_SOLUTION | Freq: Once | RESPIRATORY_TRACT | Status: AC
  Filled 2023-05-15: qty 3

## 2023-05-16 ENCOUNTER — Ambulatory Visit (INDEPENDENT_AMBULATORY_CARE_PROVIDER_SITE_OTHER): Payer: 59 | Admitting: Pulmonary Disease

## 2023-05-16 VITALS — BP 100/60 | HR 60 | Temp 97.7°F | Ht 71.0 in | Wt 171.4 lb

## 2023-05-16 DIAGNOSIS — R0602 Shortness of breath: Secondary | ICD-10-CM | POA: Diagnosis not present

## 2023-05-16 DIAGNOSIS — Z87891 Personal history of nicotine dependence: Secondary | ICD-10-CM

## 2023-05-16 DIAGNOSIS — J4489 Other specified chronic obstructive pulmonary disease: Secondary | ICD-10-CM | POA: Diagnosis not present

## 2023-05-16 DIAGNOSIS — J439 Emphysema, unspecified: Secondary | ICD-10-CM | POA: Diagnosis not present

## 2023-05-16 NOTE — Patient Instructions (Addendum)
 VISIT SUMMARY:  Today, we discussed your ongoing breathing difficulties, medication management, and heart condition. You have been experiencing shortness of breath, especially at night, and have been using your rescue inhaler frequently. We also reviewed your recent history of pneumonia and your current heart medications.  YOUR PLAN:  -CHRONIC OBSTRUCTIVE PULMONARY DISEASE (COPD): COPD is a chronic lung condition that makes it hard to breathe. To better manage your symptoms, take Trelegy at the same time every day, preferably in the evening before dinner, and rinse your mouth well after using it. We will reassess your COPD management in three months.  -PNEUMONIA: Pneumonia is an infection that inflames the air sacs in one or both lungs. Your lung function is well-managed, and you have shown improvement since your illness in February.  -HYPERTENSION: Hypertension is high blood pressure. You experienced low blood pressure this morning, which may be related to your heart medication. Please contact your cardiologist, Carola Frost for further management and possible medication adjustment.  -CARDIAC ARRHYTHMIA: Cardiac arrhythmia is an irregular heartbeat. You have been using more medication for palpitations. Please follow up with your cardiologist, Eula Listen, PA-C for further evaluation and management.  INSTRUCTIONS:  Please contact your cardiologist, \Ryan Dunn,PA-c  for further management of your hypertension and cardiac arrhythmia. We will schedule a follow-up appointment in three months to reassess your COPD management.

## 2023-05-16 NOTE — Progress Notes (Signed)
 Subjective:    Patient ID: Kristie Walters, female    DOB: April 01, 1945, 78 y.o.   MRN: [id number]  Patient Care Team: Alba Cory, MD as PCP - General (Family Medicine) Antonieta Iba, MD as PCP - Cardiology (Cardiology) Wyn Quaker Marlow Baars, MD as Consulting Physician (Vascular Surgery) Abbie Sons, MD as Referring Physician (Dermatology) Antonieta Iba, MD as Consulting Physician (Cardiology)  Chief Complaint  Patient presents with   Follow-up    Cough, shortness of breath and wheezing.     BACKGROUND/INTERVAL: 78 year old former smoker (quit 2018) with a 55-pack-year history of smoking presents for follow-up of shortness of breath and emphysema.  She was last seen on 21 January 2023, does not endorse any hospitalizations since then.  Exposure to COVID-19 and RSV in February, seen at urgent care but not tested.  She did have pneumonia treated as an outpatient.  HPI Discussed the use of AI scribe software for clinical note transcription with the patient, who gave verbal consent to proceed.  History of Present Illness   Kristie Walters is a 78 year old female who presents with breathing difficulties and medication management issues.  She experiences ongoing breathing difficulties, particularly at night. In February, she was exposed to her son who had COVID and RSV, leading to an illness that resulted in pneumonia, diagnosed via chest x-ray at an urgent care facility. Although she has improved, she continues to experience shortness of breath, especially when lying down at night, and describes a 'pop' sensation in her chest when breathing in.  She uses a rescue inhaler two to three times a night for relief, finding it more effective than her prescribed Trelegy inhaler. She sometimes takes Trelegy in the morning and sometimes in the evening (not on same day) but not consistently at the same time each day. Trelegy is the only inhaler covered by her insurance.  She discusses her heart  condition, noting that she has been taking more medication for palpitations and high blood pressure. She experienced a low blood pressure episode this morning, which she attributes to her heart medication. Her last echocardiogram showed mild mitral regurgitation, but otherwise was good.  She is due for follow-up with cardiology.  DATA: 03/26/2019 2D echo: G2 DD, mild elevation of pulmonary artery systolic pressure, LVEF 55 to 75% 09/16/2019 low-dose lung cancer screening CT: Multiple small inflammatory groundglass nodules, mild diffuse bronchial wall thickening with mild centrilobular and paraseptal emphysema, no evidence of fibrosis 03/28/2020 PFTs: FEV1 1.68 L or 59% predicted, FVC 2.06 L or 54% predicted, FEV1/FVC 82%, no bronchodilator response.  Lung volumes moderately reduced with ERV at 14% consistent with obesity.  Diffusion capacity moderately reduced and corrects for alveolar volume. 02/14/2021 LDCT: Lung RADS 2S, small solid and subsolid pulmonary nodules scattered throughout the lungs similar in size, number and distribution to prior study.  Largest is on the left lower lobe subsolid 10.9 mm in size with no change.  Extensive bilateral apical nodular pleural-parenchymal thickening and architectural distortion similar to prior. 05/15/2023 PFTs: FEV1 1.52 L or 55% predicted, FVC 1.86 L or 51% predicted, no bronchodilator response, lung volumes mildly decreased diffusion capacity moderately reduced consistent with combined obstructive/restrictive defect.  With no overt significant change from prior.    Review of Systems A 10 point review of systems was performed and it is as noted above otherwise negative.   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   Fatty liver 09/29/2017   Coronary artery disease 09/29/2017   Basal cell carcinoma of nose 08/12/2017   Estrogen deficiency 08/01/2017   Aortic atherosclerosis (HCC) 09/14/2015   Personal history of tobacco use, presenting hazards to health 08/31/2015   Paresthesia of foot, bilateral 08/15/2015   Breast mass, right 07/12/2015   Elevated alkaline phosphatase level 05/29/2015   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   Gastroesophageal reflux disease without esophagitis 04/26/2015   Hyperlipidemia 04/26/2015   Hyperglycemia 04/26/2015   Carotid stenosis 08/25/2014   H/O malignant neoplasm of skin 08/13/2012    Social History   Tobacco Use   Smoking status: Former    Current packs/day: 0.00    Average packs/day: 1 pack/day for 55.0 years (55.0 ttl pk-yrs)    Types: Cigarettes    Start date: 11/26/1961    Quit date: 11/26/2016    Years since quitting: 6.4    Passive exposure: Past   Smokeless tobacco: Former    Types: Snuff   Tobacco comments:    smoking cessation materials not required  Substance Use Topics   Alcohol use: No    Allergies  Allergen Reactions   Morphine Nausea Only and Nausea And Vomiting   Morphine And Codeine Nausea And Vomiting   Zoster Vaccine Live Itching and Rash    Current Meds  Medication Sig   amLODipine (NORVASC) 5 MG tablet Take 1 tablet (5 mg total) by mouth as needed (AS NEEDED for SBP (top blood pressure number) greater than 140).   apixaban (ELIQUIS) 5 MG TABS tablet Take 1 tablet (5 mg total) by mouth 2 (two) times daily.   aspirin 81 MG tablet Take 81 mg by mouth daily.   atorvastatin (LIPITOR) 40 MG tablet TAKE 1 TABLET BY MOUTH EVERY DAY   calcium carbonate  (OS-CAL - DOSED IN MG OF ELEMENTAL CALCIUM) 1250 (500 Ca) MG tablet Take 1 tablet by mouth.   cholecalciferol (VITAMIN D) 1000 units tablet Take 1,000 Units by mouth daily.   Cyanocobalamin (VITAMIN B-12) 5000 MCG SUBL Place under the tongue.   denosumab (PROLIA) 60 MG/ML SOSY injection Inject 60 mg into the skin every 6 (six) months.   ezetimibe (ZETIA) 10 MG tablet TAKE 1 TABLET BY MOUTH EVERY DAY   Fish Oil-Cholecalciferol (FISH OIL + D3 PO) Take 1,000 mg by mouth 1 day or 1 dose.   levalbuterol (XOPENEX HFA) 45 MCG/ACT inhaler Inhale 1-2 puffs into the lungs every 8 (eight) hours as needed for wheezing or shortness of breath.   loratadine (CLARITIN) 10 MG tablet Take 1 tablet (10 mg total) by mouth daily.   metoprolol succinate (TOPROL-XL) 25 MG 24 hr tablet Take 1.5 tablets (37.5 mg total) by mouth daily.   metoprolol tartrate (LOPRESSOR) 25 MG tablet Take 1 tablet (25 mg total) by mouth 2 (two) times daily as needed (As needed for palpitations).   omeprazole (PRILOSEC) 40 MG capsule TAKE 1 CAPSULE (40 MG TOTAL) BY MOUTH AS NEEDED   pregabalin (LYRICA) 100 MG capsule Take 1 capsule (100 mg total) by mouth at  bedtime.   TRELEGY ELLIPTA 100-62.5-25 MCG/ACT AEPB INHALE 1 PUFF BY MOUTH EVERY DAY    Immunization History  Administered Date(s) Administered   Fluad Quad(high Dose 65+) 11/03/2021, 10/24/2022   Influenza, High Dose Seasonal PF 10/08/2016, 10/01/2018, 10/08/2019, 11/01/2020   Influenza-Unspecified 11/19/2014, 11/16/2015, 10/08/2016, 09/18/2017   Moderna Covid-19 Vaccine Bivalent Booster 75yrs & up 11/15/2020, 12/05/2021   Moderna Sars-Covid-2 Vaccination 01/05/2020, 07/27/2020   PFIZER(Purple Top)SARS-COV-2 Vaccination 04/21/2019, 05/12/2019   Pneumococcal Conjugate-13 11/18/2016   Pneumococcal Polysaccharide-23 06/11/2012, 06/01/2020   Rsv, Bivalent, Protein Subunit Rsvpref,pf Verdis Frederickson) 12/26/2022   Td 02/18/2009   Td (Adult), 2 Lf Tetanus Toxid, Preservative Free 02/18/2009    Tdap 04/25/2021   Zoster Recombinant(Shingrix) 04/25/2021        Objective:     BP 100/60 (BP Location: Left Arm, Patient Position: Sitting, Cuff Size: Normal)   Pulse 60   Temp 97.7 F (36.5 C) (Temporal)   Ht 5\' 11"  (1.803 m)   Wt 171 lb 6.4 oz (77.7 kg)   SpO2 96%   BMI 23.91 kg/m   SpO2: 96 %  GENERAL: Well-developed well-nourished woman in no acute distress.  She ambulates with assistance of a cane. HEAD: Normocephalic, atraumatic.  EYES: Pupils equal, round, reactive to light.  No scleral icterus.  MOUTH: Upper dentures, oral mucosa moist.  No thrush. NECK: Supple. No thyromegaly. Trachea midline. No JVD.  No adenopathy. PULMONARY: Good air entry bilaterally.  Coarse breath sounds otherwise no adventitious sounds. CARDIOVASCULAR: S1 and S2. Regular rate and rhythm.  No rubs, murmurs or gallops heard. ABDOMEN: Benign. MUSCULOSKELETAL: No joint deformity, no clubbing, no edema.  NEUROLOGIC: No focal deficit noted, speech is fluent.  Gait steady with assistance (cane). SKIN: Intact,warm,dry. PSYCH: Normal mood, normal behavior.    Assessment & Plan:     ICD-10-CM   1. COPD with chronic bronchitis and emphysema (HCC)  J44.89    J43.9     2. Shortness of breath  R06.02     3. Personal history of tobacco use, presenting hazards to health  Z87.891       Discussion:    Chronic Obstructive Pulmonary Disease (COPD) Increased use of rescue inhaler, especially at night, suggests suboptimal control of COPD symptoms. Experiences dyspnea and a popping sensation in the chest during inspiration. Lung function tests are well-managed. Currently using Trelegy, covered by insurance. Timing of Trelegy administration may contribute to symptoms. Trelegy should reduce the need for a rescue inhaler if taken consistently. - Instruct to take Trelegy at the same time every day, preferably in the evening before dinner. - Advise to rinse mouth well after using Trelegy. - Schedule  follow-up in three months to reassess COPD management.  Pneumonia Pneumonia in February following exposure to son with COVID-19 and RSV. Reports improvement, and lung function is well-managed.  Hypertension Increased medication use for hypertension. Experienced hypotension this morning, possibly related to heart medication. Advised to contact cardiologist for further management. - Advise to contact cardiology, Eula Listen PA-C, for further management of hypertension and medication adjustment.  Cardiac arrhythmia Increased use of medication for palpitations. Advised to follow up with cardiologist for further evaluation and management. - Advise to contact cardiology, Carola Frost for further management of cardiac arrhythmia and medication adjustment.      Advised if symptoms do not improve or worsen, to please contact office for sooner follow up or seek emergency care.    I spent 32 minutes of dedicated to the care of this patient on the date of  this encounter to include pre-visit review of records, face-to-face time with the patient discussing conditions above, post visit ordering of testing, clinical documentation with the electronic health record, making appropriate referrals as documented, and communicating necessary findings to members of the patients care team.     C. Danice Goltz, MD Advanced Bronchoscopy PCCM [facility]    *This note was generated using voice recognition software/Dragon and/or AI transcription program.  Despite best efforts to proofread, errors can occur which can change the meaning. Any transcriptional errors that result from this process are unintentional and may not be fully corrected at the time of dictation.

## 2023-05-18 ENCOUNTER — Encounter: Payer: Self-pay | Admitting: Pulmonary Disease

## 2023-05-23 ENCOUNTER — Ambulatory Visit (INDEPENDENT_AMBULATORY_CARE_PROVIDER_SITE_OTHER): Payer: Self-pay | Admitting: Family Medicine

## 2023-05-23 ENCOUNTER — Encounter: Payer: Self-pay | Admitting: Family Medicine

## 2023-05-23 VITALS — BP 112/68 | HR 89 | Resp 16 | Ht 71.0 in | Wt 169.8 lb

## 2023-05-23 DIAGNOSIS — E538 Deficiency of other specified B group vitamins: Secondary | ICD-10-CM

## 2023-05-23 DIAGNOSIS — I480 Paroxysmal atrial fibrillation: Secondary | ICD-10-CM | POA: Diagnosis not present

## 2023-05-23 DIAGNOSIS — M810 Age-related osteoporosis without current pathological fracture: Secondary | ICD-10-CM

## 2023-05-23 DIAGNOSIS — I25119 Atherosclerotic heart disease of native coronary artery with unspecified angina pectoris: Secondary | ICD-10-CM

## 2023-05-23 DIAGNOSIS — R739 Hyperglycemia, unspecified: Secondary | ICD-10-CM

## 2023-05-23 DIAGNOSIS — J4489 Other specified chronic obstructive pulmonary disease: Secondary | ICD-10-CM | POA: Diagnosis not present

## 2023-05-23 DIAGNOSIS — J439 Emphysema, unspecified: Secondary | ICD-10-CM

## 2023-05-23 DIAGNOSIS — K219 Gastro-esophageal reflux disease without esophagitis: Secondary | ICD-10-CM

## 2023-05-23 DIAGNOSIS — I700 Atherosclerosis of aorta: Secondary | ICD-10-CM | POA: Diagnosis not present

## 2023-05-23 DIAGNOSIS — I471 Supraventricular tachycardia, unspecified: Secondary | ICD-10-CM

## 2023-05-23 DIAGNOSIS — G629 Polyneuropathy, unspecified: Secondary | ICD-10-CM

## 2023-05-23 MED ORDER — PREGABALIN 150 MG PO CAPS
150.0000 mg | ORAL_CAPSULE | Freq: Every day | ORAL | 1 refills | Status: DC
Start: 2023-05-23 — End: 2023-12-15

## 2023-05-23 NOTE — Progress Notes (Signed)
 Name: Kristie Walters   MRN: [id number]    DOB: 03/02/1945   Date:05/23/2023       Progress Note  Subjective  Chief Complaint  Chief Complaint  Patient presents with   Medical Management of Chronic Issues   Discussed the use of AI scribe software for clinical note transcription with the patient, who gave verbal consent to proceed.  History of Present Illness Kristie Walters is a 78 year old female with hypertension and atrial fibrillation who presents for a four-month follow-up of her regular medical problems.  She monitors her blood pressure at home and takes amlodipine 5 mg as needed when her blood pressure exceeds 140 mmHg. Her blood pressure has been more stable recently, requiring amlodipine only once or twice a week. She also takes metoprolol succinate 37.5 mg daily for atrial fibrillation and metoprolol tartrate as needed for palpitations. During a recent illness in February, initially thought to be the flu but later diagnosed as COVID-19 and pneumonia, she experienced elevated blood pressure and increased medication use. She was treated with antibiotics and has since recovered, with no current cough or shortness of breath. Her son was also seriously ill during this time, adding to her stress and anxiety.  She has a  coronary artery disease without angina, she has chronic shortness of breath, and is medically managed with aspirin, atorvastatin, and ezetimibe. No chest pain is reported, and her cholesterol levels were last checked in September, showing a low LDL of 53 mg/dL and low HDL of 34 mg/dL. She dislikes fish and shellfish but is open to trying almond butter to improve her HDL levels.  She has COPD with chronic bronchitis and emphysema, experiencing worse symptoms at night. She uses Trelegy as a maintenance inhaler and a rescue inhaler as needed. Recent spirometry showed no change from previous tests, and she reports a popping sensation and wheezing at night.  She has severe sensory  polyneuropathy and is taking alpha lipoic acid 600 mg daily, which she feels has helped. She also takes pregabalin 100 mg at night for neuropathy, which she finds somewhat effective. 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  She has osteoporosis and receives Prolia injections for postmenopausal osteoporosis. She reports no complications from the injections.   Her B12 levels were previously low but are now normal with sublingual supplementation. She also has a history of prediabetes, with a focus on maintaining a healthy diet to manage her blood sugar levels.  No current issues with reflux and she takes Prilosec as needed, avoiding greasy or spicy foods later in the day.  She lives independently, and her daughter has moved back to care for her son during his illness/ he has lymphoma .    Patient Active Problem List   Diagnosis Date Noted   Paroxysmal SVT (supraventricular tachycardia) (HCC) 01/22/2023   Gait difficulty 12/12/2021   Hepatomegaly 12/12/2021   Senile purpura (HCC) 08/15/2021   Neuropathy 08/15/2021   Bladder problem 08/15/2021   Vitamin D deficiency 08/15/2021   B12 deficiency 08/15/2021   Paroxysmal atrial fibrillation (HCC) 08/15/2021   Right lower quadrant abdominal pain 01/15/2021   Dysuria 01/15/2021   Change in bowel habits    Polyp of transverse colon    Postmenopausal osteoporosis 02/25/2018   Fatty liver 09/29/2017   Coronary artery disease 09/29/2017   Basal cell carcinoma of nose 08/12/2017   Estrogen deficiency 08/01/2017  Aortic atherosclerosis (HCC) 09/14/2015   Personal history of tobacco use, presenting hazards to health 08/31/2015   Paresthesia of foot, bilateral 08/15/2015   Breast mass, right 07/12/2015   Elevated alkaline phosphatase level 05/29/2015   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   Gastroesophageal reflux disease  without esophagitis 04/26/2015   Hyperlipidemia 04/26/2015   Hyperglycemia 04/26/2015   Carotid stenosis 08/25/2014   H/O malignant neoplasm of skin 08/13/2012    Past Surgical History:  Procedure Laterality Date   BREAST BIOPSY Left 11/27/2022   stereo bx/ x clip/ path pending   BREAST BIOPSY Left 11/27/2022   MM LT BREAST BX W LOC DEV 1ST LESION IMAGE BX SPEC STEREO GUIDE 11/27/2022 [facility]   COLONOSCOPY WITH PROPOFOL N/A 08/22/2020   Procedure: COLONOSCOPY WITH PROPOFOL;  Surgeon: Midge Minium, MD;  Location: [facility];  Service: Endoscopy;  Laterality: N/A;   ENDARTERECTOMY Left 08/25/2014   Procedure: ENDARTERECTOMY CAROTID;  Surgeon: Annice Needy, MD;  Location: [facility];  Service: Vascular;  Laterality: Left;   MOHS SURGERY     MOHS SURGERY  09/23/2017   nose   PERIPHERAL VASCULAR CATHETERIZATION N/A 07/20/2014   Procedure: Carotid Angiography;  Surgeon: Annice Needy, MD;  Location: [facility];  Service: Cardiovascular;  Laterality: N/A;   TONSILLECTOMY     TUBAL LIGATION      Family History  Problem Relation Age of Onset   Heart attack Mother    Hypercholesterolemia Mother    Hypertension Mother    Peripheral vascular disease Mother    Dementia Mother    Hypothyroidism Mother    CVA Father    Liver cancer Father    Diabetes Brother    Kidney cancer Sister    Diabetes Brother    Alzheimer's disease Brother    Other Brother        alzheimers   Lymphoma Son    HIV Son    Breast cancer Neg Hx     Social History   Tobacco Use   Smoking status: Former    Current packs/day: 0.00    Average packs/day: 1 pack/day for 55.0 years (55.0 ttl pk-yrs)    Types: Cigarettes    Start date: 11/26/1961    Quit date: 11/26/2016    Years since quitting: 6.4    Passive exposure: Past   Smokeless tobacco: Former    Types: Snuff   Tobacco comments:    smoking cessation materials not required  Substance Use Topics   Alcohol use: No     Current  Outpatient Medications:    amLODipine (NORVASC) 5 MG tablet, Take 1 tablet (5 mg total) by mouth as needed (AS NEEDED for SBP (top blood pressure number) greater than 140)., Disp: 90 tablet, Rfl: 3   apixaban (ELIQUIS) 5 MG TABS tablet, Take 1 tablet (5 mg total) by mouth 2 (two) times daily., Disp: 180 tablet, Rfl: 3   aspirin 81 MG tablet, Take 81 mg by mouth daily., Disp: , Rfl:    atorvastatin (LIPITOR) 40 MG tablet, TAKE 1 TABLET BY MOUTH EVERY DAY, Disp: 90 tablet, Rfl: 2   calcium carbonate (OS-CAL - DOSED IN MG OF ELEMENTAL CALCIUM) 1250 (500 Ca) MG tablet, Take 1 tablet by mouth., Disp: , Rfl:    cholecalciferol (VITAMIN D) 1000 units tablet, Take 1,000 Units by mouth daily., Disp: , Rfl:    Cyanocobalamin (VITAMIN B-12) 5000 MCG SUBL, Place under the tongue., Disp: , Rfl:    denosumab (PROLIA)  60 MG/ML SOSY injection, Inject 60 mg into the skin every 6 (six) months., Disp: , Rfl:    ezetimibe (ZETIA) 10 MG tablet, TAKE 1 TABLET BY MOUTH EVERY DAY, Disp: 90 tablet, Rfl: 1   Fish Oil-Cholecalciferol (FISH OIL + D3 PO), Take 1,000 mg by mouth 1 day or 1 dose., Disp: , Rfl:    levalbuterol (XOPENEX HFA) 45 MCG/ACT inhaler, Inhale 1-2 puffs into the lungs every 8 (eight) hours as needed for wheezing or shortness of breath., Disp: 1 each, Rfl: 2   loratadine (CLARITIN) 10 MG tablet, Take 1 tablet (10 mg total) by mouth daily., Disp: 30 tablet, Rfl: 11   metoprolol succinate (TOPROL-XL) 25 MG 24 hr tablet, Take 1.5 tablets (37.5 mg total) by mouth daily., Disp: 90 tablet, Rfl: 3   metoprolol tartrate (LOPRESSOR) 25 MG tablet, Take 1 tablet (25 mg total) by mouth 2 (two) times daily as needed (As needed for palpitations)., Disp: 180 tablet, Rfl: 3   omeprazole (PRILOSEC) 40 MG capsule, TAKE 1 CAPSULE (40 MG TOTAL) BY MOUTH AS NEEDED, Disp: 90 capsule, Rfl: 0   pregabalin (LYRICA) 100 MG capsule, Take 1 capsule (100 mg total) by mouth at bedtime., Disp: 90 capsule, Rfl: 1   TRELEGY ELLIPTA  100-62.5-25 MCG/ACT AEPB, INHALE 1 PUFF BY MOUTH EVERY DAY, Disp: 60 each, Rfl: 6 No current facility-administered medications for this visit.  Facility-Administered Medications Ordered in Other Visits:    albuterol (PROVENTIL) (2.5 MG/3ML) 0.083% nebulizer solution 2.5 mg, 2.5 mg, Nebulization, Once, Salena Saner, MD  Allergies  Allergen Reactions   Morphine Nausea Only and Nausea And Vomiting   Morphine And Codeine Nausea And Vomiting   Zoster Vaccine Live Itching and Rash    I personally reviewed active problem list, medication list, allergies, family history with the patient/caregiver today.   ROS  Ten systems reviewed and is negative except as mentioned in HPI    Objective Physical Exam VITALS: BP- 112/68 CONSTITUTIONAL: Patient appears well-developed and well-nourished. No distress. HEENT: Head atraumatic, normocephalic, neck supple. CARDIOVASCULAR: Normal rate, regular rhythm and heart sounds clear. No murmur heard. No BLE edema. PULMONARY: Effort normal and breath sounds clear. No respiratory distress. ABDOMINAL: There is no tenderness or distention. MUSCULOSKELETAL: slow gait, using a cane PSYCHIATRIC: Patient has a normal mood and affect. Behavior is normal. Judgment and thought content normal.  Vitals:   05/23/23 1032  BP: 112/68  Pulse: 89  Resp: 16  SpO2: 97%  Weight: 169 lb 12.8 oz (77 kg)  Height: 5\' 11"  (1.803 m)    Body mass index is 23.68 kg/m.  Recent Results (from the past 2160 hours)  Pulmonary Function Test [facility] Only     Status: None   Collection Time: 05/15/23  1:33 PM  Result Value Ref Range   FVC-%Pred-Pre 51 %   FVC-Post 1.91 L   FVC-%Pred-Post 52 %   FVC-%Change-Post 2 %   FEV1-Pre 1.52 L   FEV1-%Pred-Pre 55 %   FEV1-Post 1.58 L   FEV1-%Pred-Post 57 %   FEV1-%Change-Post 3 %   FEV6-Pre 1.86 L   FEV6-%Pred-Pre 53 %   FEV6-Post 1.91 L   FEV6-%Pred-Post 55 %   FEV6-%Change-Post 2 %   Pre FEV1/FVC ratio 82 %    FEV1FVC-%Pred-Pre 109 %   Post FEV1/FVC ratio 82 %   FEV1FVC-%Change-Post 0 %   Pre FEV6/FVC Ratio 100 %   FEV6FVC-%Pred-Pre 104 %   Post FEV6/FVC ratio 100 %   FEV6FVC-%Pred-Post 104 %  FEF 25-75 Pre 1.67 L/sec   FEF2575-%Pred-Pre 84 %   FEF 25-75 Post 1.89 L/sec   FEF2575-%Pred-Post 95 %   FEF2575-%Change-Post 13 %   RV 2.49 L   RV % pred 92 %   TLC 4.81 L   TLC % pred 78 %   DLCO unc 12.03 ml/min/mmHg   DLCO unc % pred 50 %   DL/VA 1.61 ml/min/mmHg/L   DL/VA % pred 80 %   FVC-Pre 1.86 L    Diabetic Foot Exam:     PHQ2/9:    05/23/2023   10:31 AM 01/22/2023    9:59 AM 10/24/2022    9:13 AM 08/08/2022   10:46 AM 12/12/2021   10:26 AM  Depression screen PHQ 2/9  Decreased Interest 0 0 0 0 0  Down, Depressed, Hopeless 0 0 0 0 1  PHQ - 2 Score 0 0 0 0 1  Altered sleeping 0 0 0  1  Tired, decreased energy 0 0 0  0  Change in appetite 0 0 0  0  Feeling bad or failure about yourself  0 0 0  0  Trouble concentrating 0 0 0  0  Moving slowly or fidgety/restless 0 0 0  0  Suicidal thoughts 0 0 0  0  PHQ-9 Score 0 0 0  2  Difficult doing work/chores Not difficult at all Not difficult at all   Not difficult at all    phq 9 is negative  Fall Risk:    05/23/2023   10:28 AM 01/22/2023    9:58 AM 10/24/2022    9:13 AM 08/08/2022   10:42 AM 12/12/2021   10:24 AM  Fall Risk   Falls in the past year? 0 0 0 0 0  Number falls in past yr: 0 0 0 0   Injury with Fall? 0 0 0 0   Risk for fall due to : No Fall Risks No Fall Risks No Fall Risks No Fall Risks Impaired balance/gait  Follow up Falls prevention discussed;Education provided;Falls evaluation completed Falls prevention discussed;Education provided;Falls evaluation completed Falls prevention discussed Education provided;Falls prevention discussed Falls prevention discussed;Education provided;Falls evaluation completed     Assessment & Plan Atrial Fibrillation and Paroxysmal Supraventricular Tachycardia (PSVT) Managed with  metoprolol and Eliquis. Occasional nocturnal palpitations without chest pain. - Continue metoprolol succinate 37.5 mg daily. - Use metoprolol tartrate as needed for palpitations. - Continue Eliquis as prescribed. - Contact cardiologist if significant bleeding or symptom changes occur.  Coronary Artery Disease Managed with aspirin, atorvastatin, and ezetimibe. No angina. LDL 53 mg/dL, HDL 34 mg/dL. Dietary changes challenging. - Continue aspirin, atorvastatin, and ezetimibe as prescribed. - Encourage dietary changes to increase HDL, such as consuming tree nuts and increasing physical activity.  Hypertension Well-controlled with occasional elevations. Self-monitors and takes amlodipine as needed. Current blood pressure 112/68 mmHg, asymptomatic. - Continue amlodipine 5 mg as needed for blood pressure over 140 mmHg. - Monitor blood pressure at home. - Follow up with cardiologist as needed.  Chronic Obstructive Pulmonary Disease (COPD) with Chronic Bronchitis and Emphysema Managed with Trelegy and rescue inhaler. Nocturnal shortness of breath improved with evening Trelegy. Stable lung function on spirometry. - Continue Trelegy in the evening. - Use rescue inhaler as needed. - Follow up with pulmonologist as needed.  Severe Sensorimotor Polyneuropathy Severe with balance issues and gait instability. Neurologist recommended alpha lipoic acid and pregabalin. MRI showed microvascular ischemic changes. - Continue pregabalin, increase dose to 150 mg at night. - Continue alpha  lipoic acid 600 mg daily. - Consider physical therapy when feasible.  Osteoporosis Managed with Prolia injections. No complications from recent injections. - Continue Prolia injections as scheduled.  Gastroesophageal Reflux Disease (GERD) Well-controlled with dietary modifications and occasional Prilosec. No recent exacerbations. - Continue Prilosec as needed. - Maintain dietary modifications to avoid  triggers.  General Health Maintenance B12 levels stable with sublingual supplementation. Previous prediabetes concern managed with dietary modifications. - Continue B12 supplementation. - Encourage healthy diet to manage prediabetes risk. - Replace sweets with fruits to reduce sugar intake.  Follow-up - Schedule follow-up appointment in six months.

## 2023-06-15 ENCOUNTER — Other Ambulatory Visit: Payer: Self-pay | Admitting: Cardiovascular Disease

## 2023-07-31 ENCOUNTER — Other Ambulatory Visit (INDEPENDENT_AMBULATORY_CARE_PROVIDER_SITE_OTHER): Payer: Self-pay | Admitting: Vascular Surgery

## 2023-07-31 DIAGNOSIS — I6523 Occlusion and stenosis of bilateral carotid arteries: Secondary | ICD-10-CM

## 2023-08-01 ENCOUNTER — Ambulatory Visit (INDEPENDENT_AMBULATORY_CARE_PROVIDER_SITE_OTHER): Payer: 59 | Admitting: Nurse Practitioner

## 2023-08-01 ENCOUNTER — Ambulatory Visit (INDEPENDENT_AMBULATORY_CARE_PROVIDER_SITE_OTHER): Payer: 59

## 2023-08-01 ENCOUNTER — Encounter (INDEPENDENT_AMBULATORY_CARE_PROVIDER_SITE_OTHER): Payer: Self-pay | Admitting: Nurse Practitioner

## 2023-08-01 VITALS — BP 117/73 | HR 63 | Ht 71.0 in | Wt 168.2 lb

## 2023-08-01 DIAGNOSIS — E782 Mixed hyperlipidemia: Secondary | ICD-10-CM

## 2023-08-01 DIAGNOSIS — I6523 Occlusion and stenosis of bilateral carotid arteries: Secondary | ICD-10-CM | POA: Diagnosis not present

## 2023-08-01 DIAGNOSIS — K219 Gastro-esophageal reflux disease without esophagitis: Secondary | ICD-10-CM | POA: Diagnosis not present

## 2023-08-01 NOTE — Progress Notes (Signed)
 Subjective:    Patient ID: Kristie Walters, female    DOB: [birth year], 78 y.o.   MRN: [id number] Chief Complaint  Patient presents with   1 year follow up withi carotid    Patient returns in follow up of her carotid disease.  She is 9 years s/p left CEA. Doing well today. No complaints. Specifically, the patient denies amaurosis fugax, speech or swallowing difficulties, or arm or leg weakness or numbness.  She denies any major health changes since her last visit.  Carotid duplex shows a patent left CEA and 1-39% right ICA stenosis.    Review of Systems  All other systems reviewed and are negative.      Objective:   Physical Exam Vitals reviewed.  HENT:     Head: Normocephalic.  Neck:     Vascular: No carotid bruit.   Cardiovascular:     Rate and Rhythm: Normal rate.     Pulses: Normal pulses.  Pulmonary:     Effort: Pulmonary effort is normal.   Skin:    General: Skin is warm and dry.   Neurological:     Mental Status: She is alert and oriented to person, place, and time.   Psychiatric:        Mood and Affect: Mood normal.        Behavior: Behavior normal.        Thought Content: Thought content normal.        Judgment: Judgment normal.     BP 117/73   Pulse 63   Ht 5' 11 (1.803 m)   Wt 168 lb 4 oz (76.3 kg)   BMI 23.47 kg/m   Past Medical History:  Diagnosis Date   Allergy    Atrial fibrillation (HCC)    Basal cell carcinoma of nose 08/12/2017   Carotid artery disease (HCC)    s/p L. CEA in July 2016   Centrilobular emphysema (HCC) 09/29/2017   Collagen vascular disease (HCC)    Left carotid stenosis   COPD (chronic obstructive pulmonary disease) (HCC)    Coronary artery disease 09/29/2017   Noted on chest CT July 2019   Dysrhythmia    Elevated blood pressure (not hypertension)    Fatty liver 09/29/2017   Chest CT July 2019   GERD (gastroesophageal reflux disease)    Hyperlipidemia    Personal history of tobacco use, presenting hazards to  health 08/31/2015   PONV (postoperative nausea and vomiting)     Social History   Socioeconomic History   Marital status: Divorced    Spouse name: Not on file   Number of children: 2   Years of education: Not on file   Highest education level: 10th grade  Occupational History   Occupation: Retired  Tobacco Use   Smoking status: Former    Current packs/day: 0.00    Average packs/day: 1 pack/day for 55.0 years (55.0 ttl pk-yrs)    Types: Cigarettes    Start date: 11/26/1961    Quit date: 11/26/2016    Years since quitting: 6.6    Passive exposure: Past   Smokeless tobacco: Former    Types: Snuff   Tobacco comments:    smoking cessation materials not required  Vaping Use   Vaping status: Never Used  Substance and Sexual Activity   Alcohol use: No   Drug use: No   Sexual activity: Not Currently  Other Topics Concern   Not on file  Social History Narrative    Pt lives alone  Social Drivers of Corporate investment banker Strain: Low Risk  (12/23/2022)   Received from [facility]   Overall Financial Resource Strain (CARDIA)    Difficulty of Paying Living Expenses: Not hard at all  Food Insecurity: No Food Insecurity (12/23/2022)   Received from [facility]   Hunger Vital Sign    Within the past 12 months, you worried that your food would run out before you got the money to buy more.: Never true    Within the past 12 months, the food you bought just didn't last and you didn't have money to get more.: Never true  Transportation Needs: No Transportation Needs (12/23/2022)   Received from [facility] - Transportation    In the past 12 months, has lack of transportation kept you from medical appointments or from getting medications?: No    Lack of Transportation (Non-Medical): No  Physical Activity: Inactive (08/08/2022)   Exercise Vital Sign    Days of Exercise per Week: 0 days    Minutes of Exercise per Session:  0 min  Stress: Stress Concern Present (08/08/2022)   [employer] of Occupational Health - Occupational Stress Questionnaire    Feeling of Stress : To some extent  Social Connections: Moderately Isolated (08/08/2022)   Social Connection and Isolation Panel    Frequency of Communication with Friends and Family: More than three times a week    Frequency of Social Gatherings with Friends and Family: Three times a week    Attends Religious Services: More than 4 times per year    Active Member of Clubs or Organizations: No    Attends Banker Meetings: Never    Marital Status: Divorced  Catering manager Violence: Not At Risk (08/08/2022)   Humiliation, Afraid, Rape, and Kick questionnaire    Fear of Current or Ex-Partner: No    Emotionally Abused: No    Physically Abused: No    Sexually Abused: No    Past Surgical History:  Procedure Laterality Date   BREAST BIOPSY Left 11/27/2022   stereo bx/ x clip/ path pending   BREAST BIOPSY Left 11/27/2022   MM LT BREAST BX W LOC DEV 1ST LESION IMAGE BX SPEC STEREO GUIDE 11/27/2022 [facility]   COLONOSCOPY WITH PROPOFOL  N/A 08/22/2020   Procedure: COLONOSCOPY WITH PROPOFOL ;  Surgeon: Marnee Sink, MD;  Location: [facility] ENDOSCOPY;  Service: Endoscopy;  Laterality: N/A;   ENDARTERECTOMY Left 08/25/2014   Procedure: ENDARTERECTOMY CAROTID;  Surgeon: Celso College, MD;  Location: [facility] ORS;  Service: Vascular;  Laterality: Left;   MOHS SURGERY     MOHS SURGERY  09/23/2017   nose   PERIPHERAL VASCULAR CATHETERIZATION N/A 07/20/2014   Procedure: Carotid Angiography;  Surgeon: Celso College, MD;  Location: [facility] INVASIVE CV LAB;  Service: Cardiovascular;  Laterality: N/A;   TONSILLECTOMY     TUBAL LIGATION      Family History  Problem Relation Age of Onset   Heart attack Mother    Hypercholesterolemia Mother    Hypertension Mother    Peripheral vascular disease Mother    Dementia Mother    Hypothyroidism Mother    CVA Father     Liver cancer Father    Kidney cancer Sister    Diabetes Brother    Diabetes Brother    Alzheimer's disease Brother    Other Brother        alzheimers   Lymphoma Son    HIV Son  Breast cancer Neg Hx     Allergies  Allergen Reactions   Morphine  Nausea Only and Nausea And Vomiting   Morphine  And Codeine Nausea And Vomiting   Zoster Vaccine Live Itching and Rash       Latest Ref Rng & Units 10/23/2022   12:33 PM 10/24/2021   10:30 AM 08/03/2021    2:21 PM  CBC  WBC 3.4 - 10.8 x10E3/uL 7.5  6.7  8.8   Hemoglobin 11.1 - 15.9 g/dL 16.1  09.6  04.5   Hematocrit 34.0 - 46.6 % 42.1  40.3  40.6   Platelets 150 - 450 x10E3/uL 229  165  254       CMP     Component Value Date/Time   NA 142 10/23/2022 1233   NA 141 02/20/2014 1016   K 4.2 10/23/2022 1233   K 4.0 02/20/2014 1016   CL 106 10/23/2022 1233   CL 106 02/20/2014 1016   CO2 22 10/23/2022 1233   CO2 31 02/20/2014 1016   GLUCOSE 85 10/23/2022 1233   GLUCOSE 203 (H) 10/24/2021 1030   GLUCOSE 92 02/20/2014 1016   BUN 12 10/23/2022 1233   BUN 11 02/20/2014 1016   CREATININE 0.80 10/23/2022 1233   CREATININE 0.90 04/18/2021 1423   CALCIUM  9.7 10/23/2022 1233   CALCIUM  9.2 02/20/2014 1016   PROT 6.7 10/23/2022 1233   PROT 8.4 (H) 06/24/2013 0010   ALBUMIN 3.9 10/23/2022 1233   ALBUMIN 4.0 06/24/2013 0010   AST 22 10/23/2022 1233   AST 22 06/24/2013 0010   ALT 22 10/23/2022 1233   ALT 32 06/24/2013 0010   ALKPHOS 117 10/23/2022 1233   ALKPHOS 198 (H) 06/24/2013 0010   BILITOT 0.6 10/23/2022 1233   BILITOT 0.5 06/24/2013 0010   EGFR 76 10/23/2022 1233   GFRNONAA >60 10/24/2021 1030   GFRNONAA 68 06/30/2020 1043     No results found.     Assessment & Plan:   1. Bilateral carotid artery stenosis Recommend:  Given the patient's asymptomatic subcritical stenosis no further invasive testing or surgery at this time.  Duplex ultrasound shows <40% stenosis bilaterally.  Continue antiplatelet therapy as  prescribed Continue management of CAD, HTN and Hyperlipidemia Healthy heart diet,  encouraged exercise at least 4 times per week Follow up in 12 months with duplex ultrasound and physical exam   2. Mixed hyperlipidemia Continue statin as ordered and reviewed, no changes at this time  3. Gastroesophageal reflux disease without esophagitis Continue PPI as already ordered, this medication has been reviewed and there are no changes at this time.  Avoidence of caffeine and alcohol  Moderate elevation of the head of the bed    Current Outpatient Medications on File Prior to Visit  Medication Sig Dispense Refill   Alpha-Lipoic Acid 600 MG TABS Take 1 tablet by mouth daily at 12 noon.     amLODipine  (NORVASC ) 5 MG tablet Take 1 tablet (5 mg total) by mouth as needed (AS NEEDED for SBP (top blood pressure number) greater than 140). 90 tablet 3   apixaban  (ELIQUIS ) 5 MG TABS tablet Take 1 tablet (5 mg total) by mouth 2 (two) times daily. 180 tablet 3   aspirin  81 MG tablet Take 81 mg by mouth daily.     atorvastatin  (LIPITOR) 40 MG tablet TAKE 1 TABLET BY MOUTH EVERY DAY 90 tablet 2   calcium  carbonate (OS-CAL - DOSED IN MG OF ELEMENTAL CALCIUM ) 1250 (500 Ca) MG tablet Take 1 tablet by  mouth.     cholecalciferol (VITAMIN D ) 1000 units tablet Take 1,000 Units by mouth daily.     Cyanocobalamin  (VITAMIN B-12) 5000 MCG SUBL Place under the tongue.     denosumab  (PROLIA ) 60 MG/ML SOSY injection Inject 60 mg into the skin every 6 (six) months.     ezetimibe  (ZETIA ) 10 MG tablet TAKE 1 TABLET BY MOUTH EVERY DAY 90 tablet 3   Fish Oil-Cholecalciferol (FISH OIL + D3 PO) Take 1,000 mg by mouth 1 day or 1 dose.     levalbuterol  (XOPENEX  HFA) 45 MCG/ACT inhaler Inhale 1-2 puffs into the lungs every 8 (eight) hours as needed for wheezing or shortness of breath. 1 each 2   loratadine  (CLARITIN ) 10 MG tablet Take 1 tablet (10 mg total) by mouth daily. 30 tablet 11   metoprolol  succinate (TOPROL -XL) 25 MG 24  hr tablet Take 1.5 tablets (37.5 mg total) by mouth daily. 90 tablet 3   metoprolol  tartrate (LOPRESSOR ) 25 MG tablet Take 1 tablet (25 mg total) by mouth 2 (two) times daily as needed (As needed for palpitations). 180 tablet 3   omeprazole  (PRILOSEC) 40 MG capsule TAKE 1 CAPSULE (40 MG TOTAL) BY MOUTH AS NEEDED 90 capsule 0   pregabalin  (LYRICA ) 150 MG capsule Take 1 capsule (150 mg total) by mouth at bedtime. 90 capsule 1   TRELEGY ELLIPTA  100-62.5-25 MCG/ACT AEPB INHALE 1 PUFF BY MOUTH EVERY DAY 60 each 6   Current Facility-Administered Medications on File Prior to Visit  Medication Dose Route Frequency Provider Last Rate Last Admin   albuterol  (PROVENTIL ) (2.5 MG/3ML) 0.083% nebulizer solution 2.5 mg  2.5 mg Nebulization Once Marc Senior, MD        There are no Patient Instructions on file for this visit. No follow-ups on file.   Ronon Ferger E Permelia Bamba, NP

## 2023-08-20 ENCOUNTER — Encounter: Payer: Self-pay | Admitting: Pulmonary Disease

## 2023-08-20 ENCOUNTER — Ambulatory Visit (INDEPENDENT_AMBULATORY_CARE_PROVIDER_SITE_OTHER): Admitting: Pulmonary Disease

## 2023-08-20 VITALS — BP 122/66 | HR 65 | Temp 98.9°F | Ht 71.0 in | Wt 168.0 lb

## 2023-08-20 DIAGNOSIS — J4489 Other specified chronic obstructive pulmonary disease: Secondary | ICD-10-CM

## 2023-08-20 DIAGNOSIS — Z87891 Personal history of nicotine dependence: Secondary | ICD-10-CM

## 2023-08-20 DIAGNOSIS — J439 Emphysema, unspecified: Secondary | ICD-10-CM

## 2023-08-20 DIAGNOSIS — R918 Other nonspecific abnormal finding of lung field: Secondary | ICD-10-CM

## 2023-08-20 DIAGNOSIS — Z634 Disappearance and death of family member: Secondary | ICD-10-CM

## 2023-08-20 DIAGNOSIS — R0602 Shortness of breath: Secondary | ICD-10-CM | POA: Diagnosis not present

## 2023-08-20 DIAGNOSIS — F4321 Adjustment disorder with depressed mood: Secondary | ICD-10-CM

## 2023-08-20 NOTE — Progress Notes (Signed)
 Subjective:    Patient ID: Kristie Walters, female    DOB: [birth year], 78 y.o.   MRN: [id number]  Patient Care Team: Sowles, Krichna, MD as PCP - General (Family Medicine) Perla Evalene PARAS, MD as PCP - Cardiology (Cardiology) Marea Selinda RAMAN, MD as Consulting Physician (Vascular Surgery) Alpha Chloe SAUNDERS, MD as Referring Physician (Dermatology) Perla Evalene PARAS, MD as Consulting Physician (Cardiology)  Chief Complaint  Patient presents with   Follow-up    Shortness of breath on exertion.     BACKGROUND/INTERVAL:78 year old former smoker (quit 2018) with a 55-pack-year history of smoking presents for follow-up of shortness of breath and emphysema.  She was last seen on 16 May 2023, does not endorse any hospitalizations since then.   HPI Discussed the use of AI scribe software for clinical note transcription with the patient, who gave verbal consent to proceed.  History of Present Illness   Kristie Walters is a 78 year old female with COPD who presents for follow-up.  She describes her breathing as generally 'pretty good,' but experiences dyspnea on exertion. To manage her symptoms, she avoids strenuous activities. She uses Trelegy Ellipta  daily for COPD management and has a rescue inhaler available as needed.  Her last pulmonary function test was in March 2025, with no significant change from prior testing in 2022. She had a lung cancer screening in December 2024, and another is scheduled for next week due to small nodules.  She shares that her son recently passed away from cancer, which has been emotionally challenging. She finds comfort in a new kitten, which provides companionship and support. Her daughter was with her from March to June to help care for her son during his hospice stay and is concerned about her well-being.  She has a new kitten that provides emotional support. She previously had a dog for fourteen years, which was a close companion until it passed away last  year.  She reports sleeping well, although her kitten occasionally disrupts her sleep. No other new symptoms or concerns.  Despite these challenges, overall she feels well and looks well.    DATA: 03/26/2019 2D echo: G2 DD, mild elevation of pulmonary artery systolic pressure, LVEF 55 to 39% 09/16/2019 low-dose lung cancer screening CT: Multiple small inflammatory groundglass nodules, mild diffuse bronchial wall thickening with mild centrilobular and paraseptal emphysema, no evidence of fibrosis 03/28/2020 PFTs: FEV1 1.68 L or 59% predicted, FVC 2.06 L or 54% predicted, FEV1/FVC 82%, no bronchodilator response.  Lung volumes moderately reduced with ERV at 14% consistent with obesity.  Diffusion capacity moderately reduced and corrects for alveolar volume. 02/14/2021 LDCT: Lung RADS 2S, small solid and subsolid pulmonary nodules scattered throughout the lungs similar in size, number and distribution to prior study.  Largest is on the left lower lobe subsolid 10.9 mm in size with no change.  Extensive bilateral apical nodular pleural-parenchymal thickening and architectural distortion similar to prior. 05/15/2023 PFTs: FEV1 1.52 L or 55% predicted, FVC 1.86 L or 51% predicted, no bronchodilator response, lung volumes mildly decreased diffusion capacity moderately reduced consistent with combined obstructive/restrictive defect.  With no overt significant change from prior.  Review of Systems A 10 point review of systems was performed and it is as noted above otherwise negative.   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   Fatty liver 09/29/2017   Coronary artery disease 09/29/2017   Basal cell carcinoma of nose 08/12/2017   Estrogen deficiency 08/01/2017   Aortic atherosclerosis (HCC) 09/14/2015   Personal history of tobacco use, presenting hazards to health 08/31/2015   Paresthesia of foot, bilateral 08/15/2015   Breast mass, right 07/12/2015   Elevated alkaline phosphatase level 05/29/2015   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   Gastroesophageal reflux disease without esophagitis 04/26/2015   Hyperlipidemia 04/26/2015   Hyperglycemia 04/26/2015   Carotid stenosis 08/25/2014   H/O malignant neoplasm of skin 08/13/2012    Social History   Tobacco Use   Smoking status: Former    Current packs/day: 0.00    Average packs/day: 1 pack/day for 55.0 years (55.0 ttl pk-yrs)    Types: Cigarettes    Start date: 11/26/1961    Quit date: 11/26/2016    Years since quitting: 6.7    Passive exposure: Past   Smokeless tobacco: Former    Types: Snuff   Tobacco comments:    smoking cessation materials not required  Substance Use Topics   Alcohol use: No    Allergies  Allergen Reactions   Morphine  Nausea Only and Nausea And Vomiting   Morphine  And Codeine Nausea And Vomiting   Zoster Vaccine Live Itching and Rash    Current Meds  Medication Sig   Alpha-Lipoic Acid 600 MG TABS Take 1 tablet by mouth daily at 12 noon.   amLODipine  (NORVASC ) 5 MG tablet Take 1 tablet (5 mg total) by mouth as needed (AS NEEDED for SBP (top blood pressure number) greater than 140).   apixaban  (ELIQUIS ) 5 MG TABS tablet Take 1 tablet (5 mg total) by mouth 2 (two) times daily.   aspirin  81 MG tablet Take 81 mg by mouth daily.   atorvastatin  (LIPITOR) 40 MG tablet TAKE 1 TABLET BY MOUTH EVERY DAY   calcium  carbonate (OS-CAL - DOSED IN MG OF ELEMENTAL CALCIUM ) 1250 (500 Ca) MG tablet Take  1 tablet by mouth.   cholecalciferol (VITAMIN D ) 1000 units tablet Take 1,000 Units by mouth daily.   Cyanocobalamin  (VITAMIN B-12) 5000 MCG SUBL Place under the tongue.   denosumab  (PROLIA ) 60 MG/ML SOSY injection Inject 60 mg into the skin every 6 (six) months.   ezetimibe  (ZETIA ) 10 MG tablet TAKE 1 TABLET BY MOUTH EVERY DAY   Fish Oil-Cholecalciferol (FISH OIL + D3 PO) Take 1,000 mg by mouth 1 day or 1 dose.   levalbuterol  (XOPENEX  HFA) 45 MCG/ACT inhaler Inhale 1-2 puffs into the lungs every 8 (eight) hours as needed for wheezing or shortness of breath.   metoprolol  succinate (TOPROL -XL) 25 MG 24 hr tablet Take 1.5 tablets (37.5 mg total) by mouth daily.   metoprolol  tartrate (LOPRESSOR ) 25 MG tablet Take 1 tablet (25 mg total) by mouth 2 (two) times daily as needed (As needed for palpitations).   omeprazole  (PRILOSEC) 40 MG capsule TAKE 1 CAPSULE (40 MG TOTAL) BY MOUTH AS NEEDED   pregabalin  (LYRICA ) 150 MG capsule Take 1 capsule (150 mg total) by mouth at bedtime.   TRELEGY ELLIPTA  100-62.5-25 MCG/ACT AEPB INHALE 1 PUFF BY MOUTH EVERY DAY    Immunization  History  Administered Date(s) Administered   Fluad Quad(high Dose 65+) 11/03/2021, 10/24/2022   Influenza, High Dose Seasonal PF 10/08/2016, 10/01/2018, 10/08/2019, 11/01/2020   Influenza-Unspecified 11/19/2014, 11/16/2015, 10/08/2016, 09/18/2017   Moderna Covid-19 Vaccine Bivalent Booster 63yrs & up 11/15/2020, 12/05/2021   Moderna Sars-Covid-2 Vaccination 01/05/2020, 07/27/2020   PFIZER(Purple Top)SARS-COV-2 Vaccination 04/21/2019, 05/12/2019   Pneumococcal Conjugate-13 11/18/2016   Pneumococcal Polysaccharide-23 06/11/2012, 06/01/2020   Rsv, Bivalent, Protein Subunit Rsvpref,pf [place]) 12/26/2022   Td 02/18/2009   Td (Adult), 2 Lf Tetanus Toxid, Preservative Free 02/18/2009   Tdap 04/25/2021   Zoster Recombinant(Shingrix ) 04/25/2021        Objective:     BP 122/66 (BP Location: Left Arm, Patient Position: Sitting, Cuff  Size: Normal)   Pulse 65   Temp 98.9 F (37.2 C) (Oral)   Ht 5' 11 (1.803 m)   Wt 168 lb (76.2 kg)   SpO2 98%   BMI 23.43 kg/m   SpO2: 98 %  GENERAL: Well-developed well-nourished woman in no acute distress.  She ambulates with assistance of a cane. HEAD: Normocephalic, atraumatic.  EYES: Pupils equal, round, reactive to light.  No scleral icterus.  MOUTH: Upper dentures, oral mucosa moist.  No thrush. NECK: Supple. No thyromegaly. Trachea midline. No JVD.  No adenopathy. PULMONARY: Good air entry bilaterally.  Coarse breath sounds otherwise no adventitious sounds. CARDIOVASCULAR: S1 and S2. Regular rate and rhythm.  No rubs, murmurs or gallops heard. ABDOMEN: Benign. MUSCULOSKELETAL: No joint deformity, no clubbing, no edema.  NEUROLOGIC: No focal deficit noted, speech is fluent.  Gait steady with assistance (cane). SKIN: Intact,warm,dry. PSYCH: Normal mood, normal behavior.       Assessment & Plan:     ICD-10-CM   1. COPD with chronic bronchitis and emphysema (HCC)  J44.89    J43.9     2. Shortness of breath  R06.02     3. Multiple subsolid lung nodules less than 6 mm in diameter  R91.8     4. Personal history of tobacco use, presenting hazards to health  Z87.891     5. Grief at loss of child  F43.21    Z63.4      Discussion:    Chronic Obstructive Pulmonary Disease (COPD), moderate Moderate COPD with stable pulmonary function tests since 2022. Symptoms include dyspnea on exertion, but overall breathing is satisfactory.  Dyspnea unchanged in character.  No significant changes since last evaluation. - Continue Trelegy Ellipta  as prescribed - Ensure adequate supply of rescue inhaler - Consider pulmonary rehab - Schedule follow-up in 3 months  Emphysema Mild emphysema on imaging with no acute exacerbations. Current management is effective. - Review results of upcoming lung scan for further assessment  Multiple lung nodules Multiple lung nodule likely due  infection/inflammation episodes. No current signs of active infection or recent episodes. - Review lung scan results to monitor for changes or signs of infection      Advised if symptoms do not improve or worsen, to please contact office for sooner follow up or seek emergency care.    I spent 33 minutes of dedicated to the care of this patient on the date of this encounter to include pre-visit review of records, face-to-face time with the patient discussing conditions above, post visit ordering of testing, clinical documentation with the electronic health record, making appropriate referrals as documented, and communicating necessary findings to members of the patients care team.     C. Leita Sanders, MD Advanced Bronchoscopy [facility]    *This  note was generated using voice recognition software/Dragon and/or AI transcription program.  Despite best efforts to proofread, errors can occur which can change the meaning. Any transcriptional errors that result from this process are unintentional and may not be fully corrected at the time of dictation.

## 2023-08-20 NOTE — Patient Instructions (Signed)
 VISIT SUMMARY:  You came in today for a follow-up visit regarding your COPD. You mentioned that your breathing is generally good, although you experience shortness of breath with exertion. You are currently using Trelegy Ellipta  daily and have a rescue inhaler for emergencies. We also discussed your recent emotional challenges and the comfort you find in your new kitten. Your last lung cancer screening was in December, and you have another one scheduled for next week.  YOUR PLAN:  -CHRONIC OBSTRUCTIVE PULMONARY DISEASE (COPD): COPD is a chronic lung disease that makes it hard to breathe. Your condition is moderate and well-managed with your current medication, Trelegy Ellipta . Continue using it as prescribed and ensure you have enough of your rescue inhaler. We will see you again in 3 months to monitor your condition.  -EMPHYSEMA: Emphysema is a type of COPD that damages the air sacs in your lungs. Your condition is mild and currently well-managed. We will review the results of your upcoming lung scan to assess any changes.   INSTRUCTIONS:  Please continue using Trelegy Ellipta  daily and ensure you have an adequate supply of your rescue inhaler. We will review the results of your upcoming lung scan to monitor your emphysema and any changes related to previous pneumonia. Schedule a follow-up appointment in 3 months.

## 2023-08-26 ENCOUNTER — Ambulatory Visit
Admission: RE | Admit: 2023-08-26 | Discharge: 2023-08-26 | Disposition: A | Discharge status: Home / Self Care | Source: Ambulatory Visit | Attending: Acute Care | Admitting: Acute Care

## 2023-08-26 DIAGNOSIS — Z87891 Personal history of nicotine dependence: Secondary | ICD-10-CM | POA: Insufficient documentation

## 2023-08-26 DIAGNOSIS — Z122 Encounter for screening for malignant neoplasm of respiratory organs: Secondary | ICD-10-CM | POA: Diagnosis present

## 2023-08-26 DIAGNOSIS — R911 Solitary pulmonary nodule: Secondary | ICD-10-CM | POA: Insufficient documentation

## 2023-09-05 ENCOUNTER — Telehealth (HOSPITAL_BASED_OUTPATIENT_CLINIC_OR_DEPARTMENT_OTHER): Payer: Self-pay

## 2023-09-05 NOTE — Telephone Encounter (Signed)
 Call Report  IMPRESSION: 1. New 6.7 mm posterior left upper lobe or nodular consolidation. Finding may be infectious/inflammatory in etiology. Lung-RADS 4A, suspicious. Follow up low-dose chest CT without contrast in 3 months (please use the following order, CT CHEST LCS NODULE FOLLOW-UP W/O CM) is recommended. These results will be called to the ordering clinician or representative by the Radiologist Assistant, and communication documented in the PACS or [employer]. Cylindrical bronchiectasis. 2. Small left adrenal adenoma. 3. Subcentimeter hyperdense lesion in the left kidney, stable. This can be reassessed on follow-up lung cancer screening CT. 4. Aortic atherosclerosis (ICD10-I70.0). Coronary artery calcification. 5.  Emphysema (ICD10-J43.9).

## 2023-09-05 NOTE — Telephone Encounter (Signed)
 Copied from CRM [id number]. Topic: Clinical - Lab/Test Results >> Sep 05, 2023 10:10 AM Nathanel DEL wrote: Reason for CRM: MJ w/ [facility] radiology calling to confirm you received CT lung cancer screening results >> Sep 05, 2023 10:58 AM CMA Warren ORN wrote: Results are in chart waiting for review from provider

## 2023-09-08 ENCOUNTER — Telehealth: Payer: Self-pay | Admitting: Acute Care

## 2023-09-08 DIAGNOSIS — Z87891 Personal history of nicotine dependence: Secondary | ICD-10-CM

## 2023-09-08 DIAGNOSIS — R911 Solitary pulmonary nodule: Secondary | ICD-10-CM

## 2023-09-08 NOTE — Telephone Encounter (Signed)
 Please call patient and let her know her LDCT scan was read as a LR 4 A, suspicious. There is a new 6.7 mm lung nodule that may be infectious or inflammatory. Recommendation is for a 3 month follow up scan . This will be due after 11/26/2023. Please ask her if she has been sick. If she is/ has been sick , please have her follow up with her PCP to be evaluated prior to her repeat scan.  Thanks so much, please fax results to PCP and let them know plan for care.

## 2023-09-09 NOTE — Telephone Encounter (Signed)
 See telephone note from 09/08/23.

## 2023-09-09 NOTE — Telephone Encounter (Signed)
 Spoke with patient and reviewed lung screening CT results. One new nodule noted. Patient denies being sick at the time of the scan. Advised that we will repeat CT in 3 months. Pt verbalized understanding. Results/ plans faxed to PCP. CT scheduled 12/01/23.

## 2023-09-09 NOTE — Telephone Encounter (Signed)
 Left voicemail for patient to call to discuss lung screening CT results.

## 2023-09-12 ENCOUNTER — Other Ambulatory Visit: Payer: Self-pay | Admitting: Physician Assistant

## 2023-09-29 ENCOUNTER — Other Ambulatory Visit: Payer: Self-pay | Admitting: Pulmonary Disease

## 2023-10-08 ENCOUNTER — Other Ambulatory Visit: Payer: Self-pay

## 2023-10-08 ENCOUNTER — Observation Stay
Admission: EM | Admit: 2023-10-08 | Discharge: 2023-10-08 | Disposition: A | Discharge status: Home / Self Care | Attending: Emergency Medicine | Admitting: Emergency Medicine

## 2023-10-08 ENCOUNTER — Emergency Department

## 2023-10-08 DIAGNOSIS — I480 Paroxysmal atrial fibrillation: Secondary | ICD-10-CM | POA: Diagnosis not present

## 2023-10-08 DIAGNOSIS — J449 Chronic obstructive pulmonary disease, unspecified: Secondary | ICD-10-CM | POA: Insufficient documentation

## 2023-10-08 DIAGNOSIS — R0602 Shortness of breath: Secondary | ICD-10-CM | POA: Insufficient documentation

## 2023-10-08 DIAGNOSIS — I2510 Atherosclerotic heart disease of native coronary artery without angina pectoris: Secondary | ICD-10-CM | POA: Insufficient documentation

## 2023-10-08 DIAGNOSIS — R0789 Other chest pain: Secondary | ICD-10-CM | POA: Diagnosis present

## 2023-10-08 DIAGNOSIS — Z7901 Long term (current) use of anticoagulants: Secondary | ICD-10-CM | POA: Insufficient documentation

## 2023-10-08 LAB — BASIC METABOLIC PANEL WITH GFR
Anion gap: 11 (ref 5–15)
BUN: 17 mg/dL (ref 8–23)
CO2: 23 mmol/L (ref 22–32)
Calcium: 9.6 mg/dL (ref 8.9–10.3)
Chloride: 108 mmol/L (ref 98–111)
Creatinine, Ser: 0.85 mg/dL (ref 0.44–1.00)
GFR, Estimated: >60 mL/min (ref >60)
Glucose, Bld: 164 mg/dL — ABNORMAL HIGH (ref 70–99)
Potassium: 3.5 mmol/L (ref 3.5–5.1)
Sodium: 142 mmol/L (ref 135–145)

## 2023-10-08 LAB — URINALYSIS, ROUTINE W REFLEX MICROSCOPIC
Bacteria, UA: NONE SEEN
Bilirubin Urine: NEGATIVE
Glucose, UA: NEGATIVE mg/dL
Hgb urine dipstick: NEGATIVE
Ketones, ur: NEGATIVE mg/dL
Nitrite: NEGATIVE
Protein, ur: NEGATIVE mg/dL
RBC / HPF: 0 RBC/hpf (ref 0–5)
Specific Gravity, Urine: 1.003 — ABNORMAL LOW (ref 1.005–1.030)
pH: 6 (ref 5.0–8.0)

## 2023-10-08 LAB — CBC
HCT: 40.7 % (ref 36.0–46.0)
Hemoglobin: 13.6 g/dL (ref 12.0–15.0)
MCH: 29.8 pg (ref 26.0–34.0)
MCHC: 33.4 g/dL (ref 30.0–36.0)
MCV: 89.1 fL (ref 80.0–100.0)
Platelets: 208 K/uL (ref 150–400)
RBC: 4.57 MIL/uL (ref 3.87–5.11)
RDW: 14 % (ref 11.5–15.5)
WBC: 9.7 K/uL (ref 4.0–10.5)
nRBC: 0 % (ref 0.0–0.2)

## 2023-10-08 LAB — PHOSPHORUS: Phosphorus: 3 mg/dL (ref 2.5–4.6)

## 2023-10-08 LAB — BRAIN NATRIURETIC PEPTIDE: B Natriuretic Peptide: 71.8 pg/mL (ref 0.0–100.0)

## 2023-10-08 LAB — TSH: TSH: 5.517 u[IU]/mL — ABNORMAL HIGH (ref 0.350–4.500)

## 2023-10-08 LAB — TROPONIN I (HIGH SENSITIVITY)
Troponin I (High Sensitivity): 11 ng/L (ref <18)
Troponin I (High Sensitivity): 26 ng/L — ABNORMAL HIGH (ref <18)
Troponin I (High Sensitivity): 24 ng/L — ABNORMAL HIGH (ref <18)

## 2023-10-08 LAB — T4, FREE: Free T4: 0.86 ng/dL (ref 0.61–1.12)

## 2023-10-08 LAB — MAGNESIUM: Magnesium: 2 mg/dL (ref 1.7–2.4)

## 2023-10-08 MED ORDER — POTASSIUM CHLORIDE CRYS ER 20 MEQ PO TBCR
40.0000 meq | EXTENDED_RELEASE_TABLET | Freq: Once | ORAL | Status: AC
Start: 2023-10-08 — End: 2023-10-08
  Administered 2023-10-08: 40 meq via ORAL
  Filled 2023-10-08: qty 2

## 2023-10-08 MED ORDER — DILTIAZEM HCL 25 MG/5ML IV SOLN
10.0000 mg | Freq: Once | INTRAVENOUS | Status: AC
  Administered 2023-10-08: 10 mg via INTRAVENOUS
  Filled 2023-10-08: qty 5

## 2023-10-08 MED ORDER — ASPIRIN 81 MG PO CHEW
324.0000 mg | CHEWABLE_TABLET | Freq: Once | ORAL | Status: AC
  Administered 2023-10-08: 324 mg via ORAL
  Filled 2023-10-08: qty 4

## 2023-10-08 NOTE — ED Provider Notes (Signed)
 [facility] Provider Note    Event Date/Time   First MD Initiated Contact with Patient 10/08/23 0025     (approximate)   History   Atrial Fibrillation   HPI  Kristie Walters is a 78 y.o. female with history of paroxysmal atrial fibrillation on metoprolol  and Eliquis , COPD, hyperlipidemia who presents to the emergency department complaints of chest discomfort, palpitations, shortness of breath.  States she is very aware when she goes into atrial fibrillation and states it came on tonight at 10:30 PM.  She denies that her heart was racing but felt abnormal and extremely short of breath especially with exertion and lying flat.  No calf tenderness or calf swelling.  Reports she has been taking her metoprolol  succinate daily and took a 50 mg dose of metoprolol  tartrate when her symptoms came on.  Denies missing any Eliquis .  Has never been electrically cardioverted before.  Followed by cardiology here.  No fevers, cough, vomiting or diarrhea.   History provided by patient and EMS.    Past Medical History:  Diagnosis Date   Allergy    Atrial fibrillation (HCC)    Basal cell carcinoma of nose 08/12/2017   Carotid artery disease (HCC)    s/p L. CEA in July 2016   Centrilobular emphysema (HCC) 09/29/2017   Collagen vascular disease (HCC)    Left carotid stenosis   COPD (chronic obstructive pulmonary disease) (HCC)    Coronary artery disease 09/29/2017   Noted on chest CT July 2019   Dysrhythmia    Elevated blood pressure (not hypertension)    Fatty liver 09/29/2017   Chest CT July 2019   GERD (gastroesophageal reflux disease)    Hyperlipidemia    Personal history of tobacco use, presenting hazards to health 08/31/2015   PONV (postoperative nausea and vomiting)     Past Surgical History:  Procedure Laterality Date   BREAST BIOPSY Left 11/27/2022   stereo bx/ x clip/ path pending   BREAST BIOPSY Left 11/27/2022   MM LT BREAST BX W LOC DEV 1ST LESION IMAGE  BX SPEC STEREO GUIDE 11/27/2022 [facility]   COLONOSCOPY WITH PROPOFOL  N/A 08/22/2020   Procedure: COLONOSCOPY WITH PROPOFOL ;  Surgeon: Jinny Carmine, MD;  Location: [facility] ENDOSCOPY;  Service: Endoscopy;  Laterality: N/A;   ENDARTERECTOMY Left 08/25/2014   Procedure: ENDARTERECTOMY CAROTID;  Surgeon: Selinda GORMAN Gu, MD;  Location: [facility] ORS;  Service: Vascular;  Laterality: Left;   MOHS SURGERY     MOHS SURGERY  09/23/2017   nose   PERIPHERAL VASCULAR CATHETERIZATION N/A 07/20/2014   Procedure: Carotid Angiography;  Surgeon: Selinda GORMAN Gu, MD;  Location: [facility] INVASIVE CV LAB;  Service: Cardiovascular;  Laterality: N/A;   TONSILLECTOMY     TUBAL LIGATION      MEDICATIONS:  Prior to Admission medications   Medication Sig Start Date End Date Taking? Authorizing Provider  Alpha-Lipoic Acid 600 MG TABS Take 1 tablet by mouth daily at 12 noon.    Maree Jannett POUR, MD  amLODipine  (NORVASC ) 5 MG tablet Take 1 tablet (5 mg total) by mouth as needed (AS NEEDED for SBP (top blood pressure number) greater than 140). 10/23/22 10/18/23  Dunn, Bernardino HERO, PA-C  apixaban  (ELIQUIS ) 5 MG TABS tablet Take 1 tablet (5 mg total) by mouth 2 (two) times daily. 10/23/22   Abigail Bernardino HERO, PA-C  aspirin  81 MG tablet Take 81 mg by mouth daily.    [provider]  atorvastatin  (LIPITOR) 40 MG tablet TAKE  1 TABLET BY MOUTH EVERY DAY 03/17/23   Gollan, Timothy J, MD  calcium  carbonate (OS-CAL - DOSED IN MG OF ELEMENTAL CALCIUM ) 1250 (500 Ca) MG tablet Take 1 tablet by mouth.    [provider]  cholecalciferol (VITAMIN D ) 1000 units tablet Take 1,000 Units by mouth daily.    [provider]  Cyanocobalamin  (VITAMIN B-12) 5000 MCG SUBL Place under the tongue.    [provider]  denosumab  (PROLIA ) 60 MG/ML SOSY injection Inject 60 mg into the skin every 6 (six) months. 12/30/22   [provider]  ezetimibe  (ZETIA ) 10 MG tablet TAKE 1 TABLET BY MOUTH EVERY DAY 06/16/23   Gollan, Timothy J, MD   Fish Oil-Cholecalciferol (FISH OIL + D3 PO) Take 1,000 mg by mouth 1 day or 1 dose.    [provider]  Fluticasone -Umeclidin-Vilant (TRELEGY ELLIPTA ) 100-62.5-25 MCG/ACT AEPB INHALE 1 PUFF BY MOUTH EVERY DAY 09/29/23   Tamea Dedra CROME, MD  levalbuterol  (XOPENEX  HFA) 45 MCG/ACT inhaler Inhale 1-2 puffs into the lungs every 8 (eight) hours as needed for wheezing or shortness of breath. 01/21/23 01/21/24  Tamea Dedra CROME, MD  metoprolol  succinate (TOPROL -XL) 25 MG 24 hr tablet Take 1.5 tablets (37.5 mg total) by mouth daily. 12/24/22   Dunn, Bernardino HERO, PA-C  metoprolol  tartrate (LOPRESSOR ) 25 MG tablet TAKE 1 TABLET (25 MG TOTAL) BY MOUTH 2 (TWO) TIMES DAILY AS NEEDED (AS NEEDED FOR PALPITATIONS). 09/12/23   Gollan, Timothy J, MD  omeprazole  (PRILOSEC) 40 MG capsule TAKE 1 CAPSULE (40 MG TOTAL) BY MOUTH AS NEEDED 08/31/21   Sowles, Krichna, MD  pregabalin  (LYRICA ) 150 MG capsule Take 1 capsule (150 mg total) by mouth at bedtime. 05/23/23   Glenard Mire, MD    Physical Exam   Triage Vital Signs: ED Triage Vitals  Encounter Vitals Group     BP 10/08/23 0034 (!) 157/85     Girls Systolic BP Percentile --      Girls Diastolic BP Percentile --      Boys Systolic BP Percentile --      Boys Diastolic BP Percentile --      Pulse Rate 10/08/23 0034 92     Resp 10/08/23 0034 16     Temp 10/08/23 0034 98 F (36.7 C)     Temp Source 10/08/23 0034 Oral     SpO2 10/08/23 0034 97 %     Weight 10/08/23 0035 170 lb (77.1 kg)     Height 10/08/23 0035 5' 11 (1.803 m)     Head Circumference --      Peak Flow --      Pain Score 10/08/23 0035 0     Pain Loc --      Pain Education --      Exclude from Growth Chart --     Most recent vital signs: Vitals:   10/08/23 0530 10/08/23 0553  BP: 114/64   Pulse: 73   Resp: 15   Temp:  97.7 F (36.5 C)  SpO2: 94%     CONSTITUTIONAL: Alert, responds appropriately to questions. Well-appearing; well-nourished HEAD: Normocephalic, atraumatic EYES:  Conjunctivae clear, pupils appear equal, sclera nonicteric ENT: normal nose; moist mucous membranes NECK: Supple, normal ROM CARD: Irregular and rate controlled; S1 and S2 appreciated RESP: Normal chest excursion without splinting or tachypnea; breath sounds clear and equal bilaterally; no wheezes, no rhonchi, no rales, no hypoxia or respiratory distress, speaking full sentences ABD/GI: Non-distended; soft, non-tender, no rebound, no guarding, no peritoneal  signs BACK: The back appears normal EXT: Normal ROM in all joints; no deformity noted, no edema, no calf tenderness or calf swelling SKIN: Normal color for age and race; warm; no rash on exposed skin NEURO: Moves all extremities equally, normal speech PSYCH: The patient's mood and manner are appropriate.   ED Results / Procedures / Treatments   LABS: (all labs ordered are listed, but only abnormal results are displayed) Labs Reviewed  BASIC METABOLIC PANEL WITH GFR - Abnormal; Notable for the following components:      Result Value   Glucose, Bld 164 (*)    All other components within normal limits  URINALYSIS, ROUTINE W REFLEX MICROSCOPIC - Abnormal; Notable for the following components:   Color, Urine STRAW (*)    APPearance CLEAR (*)    Specific Gravity, Urine 1.003 (*)    Leukocytes,Ua TRACE (*)    All other components within normal limits  TSH - Abnormal; Notable for the following components:   TSH 5.517 (*)    All other components within normal limits  TROPONIN I (HIGH SENSITIVITY) - Abnormal; Notable for the following components:   Troponin I (High Sensitivity) 26 (*)    All other components within normal limits  CBC  BRAIN NATRIURETIC PEPTIDE  MAGNESIUM   T4, FREE  TROPONIN I (HIGH SENSITIVITY)  TROPONIN I (HIGH SENSITIVITY)     EKG:  EKG Interpretation Date/Time:  Wednesday October 08 2023 00:33:33 EDT Ventricular Rate:  89 PR Interval:    QRS Duration:  147 QT Interval:  414 QTC Calculation: 472 R  Axis:   13  Text Interpretation: Atrial fibrillation Multiple ventricular premature complexes Left bundle branch block Confirmed by Neomi Neptune [phone]) on 10/08/2023 12:36:12 AM         EKG Interpretation Date/Time:  Wednesday October 08 2023 03:51:59 EDT Ventricular Rate:  74 PR Interval:    QRS Duration:  93 QT Interval:  409 QTC Calculation: 454 R Axis:   66  Text Interpretation: Atrial fibrillation Minimal ST elevation, inferior leads Confirmed by Neomi Neptune [id number]) on 10/08/2023 4:29:54 AM        RADIOLOGY: My personal review and interpretation of imaging: Chest x-ray clear.  I have personally reviewed all radiology reports.   DG Chest 1 View Result Date: 10/08/2023 CLINICAL DATA:  AFib EXAM: CHEST  1 VIEW COMPARISON:  10/24/2021 FINDINGS: Stable cardiomediastinal silhouette. Hyperinflation and chronic bronchitic change. No focal consolidation, pleural effusion, or pneumothorax. No displaced rib fractures. IMPRESSION: No acute process.  Emphysema. Electronically Signed   By: Norman Gatlin M.D.   On: 10/08/2023 01:24     PROCEDURES:  Critical Care performed: No     .1-3 Lead EKG Interpretation  Performed by: Mersadies Petree, Neptune SAILOR, DO Authorized by: Yennifer Segovia, Neptune SAILOR, DO     Interpretation: abnormal     ECG rate:  75   Rhythm: atrial fibrillation     Ectopy: none     Conduction: normal       IMPRESSION / MDM / ASSESSMENT AND PLAN / ED COURSE  I reviewed the triage vital signs and the nursing notes.    Patient here with shortness of breath, palpitations.  In A-fib but rate controlled.  The patient is on the cardiac monitor to evaluate for evidence of arrhythmia and/or significant heart rate changes.   DIFFERENTIAL DIAGNOSIS (includes but not limited to):   Symptomatic atrial fibrillation, CHF exacerbation, COPD exacerbation, ACS, pneumonia, PE, pneumothorax   Patient's presentation is most consistent with acute presentation  with potential threat to life  or bodily function.   PLAN: EKG shows A-fib without ischemic change.  She is currently rate controlled but symptomatic and states she knows exactly when her A-fib started.  She reports compliance with her Eliquis  and we discussed that she would be a candidate for electrocardioversion electively but she declines this.  Will give a dose of diltiazem  as it appears this has helped her before and she has spontaneously converted in the ED previously.  Will obtain cardiac labs, check electrolytes, thyroid  function study.  She has history of COPD but is not wheezing and has good aeration.  This does not appear to be a COPD exacerbation.   MEDICATIONS GIVEN IN ED: Medications  diltiazem  (CARDIZEM ) injection 10 mg (10 mg Intravenous Given 10/08/23 0102)  aspirin  chewable tablet 324 mg (324 mg Oral Given 10/08/23 0554)     ED COURSE: Patient's labs show hemoglobin of 13.6.  Normal electrolytes.  TSH is elevated, free T4 normal.  Negative BNP.  BNP was obtained due to orthopnea to rule out CHF.  Troponin initially negative but then rising to 26.  Although this is only a very minimal elevation, I am concerned given she is still symptomatic despite heart rate being in the 60s to 70s and she has not had any A-fib with RVR while here in the ED and denies any fast heart rate at home.  Will give full dose aspirin .  I have recommended observation in the hospital.  Will continue to trend her troponins.  She is comfortable with this plan.  CONSULTS:  Consulted and discussed patient's case with hospitalist, Dr. Lawence.  I have recommended admission and consulting physician agrees and will place admission orders.  Patient (and family if present) agree with this plan.   I reviewed all nursing notes, vitals, pertinent previous records.  All labs, EKGs, imaging ordered have been independently reviewed and interpreted by myself.    OUTSIDE RECORDS REVIEWED: Reviewed recent cardiology and pulmonology notes.       FINAL  CLINICAL IMPRESSION(S) / ED DIAGNOSES   Final diagnoses:  Paroxysmal atrial fibrillation (HCC)  Shortness of breath     Rx / DC Orders   ED Discharge Orders     None        Note:  This document was prepared using Dragon voice recognition software and may include unintentional dictation errors.   Odell Choung, Josette SAILOR, [place] 10/08/23 [phone]

## 2023-10-08 NOTE — ED Triage Notes (Incomplete)
 Patient brought in by EMS from home. Pt's heart started feeling funny with sob around 2300, took metoprolol  50mg . Pt called when symptoms didn't improve. AFIB with EMS, no meds given. AxOx4 in triage.

## 2023-10-08 NOTE — Discharge Summary (Signed)
 Physician Discharge Summary   Patient: Kristie Walters MRN: [id number] DOB: Mar 24, 1945  Admit date:     10/08/2023  Discharge date: 10/08/23  Discharge Physician: Cort ONEIDA Mana   PCP: Sowles, Krichna, MD   Recommendations at discharge:   PCP in 1 week  Discharge Diagnoses: Principal Problem:   Paroxysmal atrial fibrillation [facility])   Hospital Course:  78 year old female patient with past medical history of PAF on metoprolol  and Eliquis  COPD presented with palpitations and shortness of breath was found to be in rapid A-fib, after given 1 dose of IV Cardizem , patient became rate controlled A-fib for more than 7 hours in the ED, or her symptoms including palpitation and shortness of breath resolved after heart rate Control.  Resume home PAF medication including metoprolol  and Eliquis  and discharged home in stable condition.      Pain control - [place]  Controlled Substance Reporting System database was reviewed. and patient was instructed, not to drive, operate heavy machinery, perform activities at heights, swimming or participation in water activities or provide baby-sitting services while on Pain, Sleep and Anxiety Medications; until their outpatient Physician has advised to do so again. Also recommended to not to take more than prescribed Pain, Sleep and Anxiety Medications.  Consultants: None Procedures performed: None Disposition: Home Diet recommendation:  Cardiac diet DISCHARGE MEDICATION: Allergies as of 10/08/2023       Reactions   Morphine  Nausea Only, Nausea And Vomiting   Morphine  And Codeine Nausea And Vomiting   Zoster Vaccine Live Itching, Rash        Medication List     STOP taking these medications    Alpha-Lipoic Acid 600 MG Tabs   calcium  carbonate 1250 (500 Ca) MG tablet Commonly known as: OS-CAL - dosed in mg of elemental calcium    cholecalciferol 1000 units tablet Commonly known as: VITAMIN D    levalbuterol  45 MCG/ACT inhaler Commonly known  as: XOPENEX  HFA   omeprazole  40 MG capsule Commonly known as: PRILOSEC   Vitamin B-12 5000 MCG Subl       TAKE these medications    amLODipine  5 MG tablet Commonly known as: NORVASC  Take 1 tablet (5 mg total) by mouth as needed (AS NEEDED for SBP (top blood pressure number) greater than 140).   apixaban  5 MG Tabs tablet Commonly known as: Eliquis  Take 1 tablet (5 mg total) by mouth 2 (two) times daily.   aspirin  81 MG tablet Take 81 mg by mouth daily.   atorvastatin  40 MG tablet Commonly known as: LIPITOR TAKE 1 TABLET BY MOUTH EVERY DAY   denosumab  60 MG/ML Sosy injection Commonly known as: PROLIA  Inject 60 mg into the skin every 6 (six) months.   ezetimibe  10 MG tablet Commonly known as: ZETIA  TAKE 1 TABLET BY MOUTH EVERY DAY   FISH OIL + D3 PO Take 1,000 mg by mouth 1 day or 1 dose.   metoprolol  succinate 25 MG 24 hr tablet Commonly known as: TOPROL -XL Take 1.5 tablets (37.5 mg total) by mouth daily.   metoprolol  tartrate 25 MG tablet Commonly known as: LOPRESSOR  TAKE 1 TABLET (25 MG TOTAL) BY MOUTH 2 (TWO) TIMES DAILY AS NEEDED (AS NEEDED FOR PALPITATIONS).   pregabalin  150 MG capsule Commonly known as: Lyrica  Take 1 capsule (150 mg total) by mouth at bedtime.   Trelegy Ellipta  100-62.5-25 MCG/ACT Aepb Generic drug: Fluticasone -Umeclidin-Vilant INHALE 1 PUFF BY MOUTH EVERY DAY        Discharge Exam: Filed Weights   10/08/23 0035  Weight:  77.1 kg   Eyes: PERRL, lids and conjunctivae normal ENMT: Mucous membranes are moist. Posterior pharynx clear of any exudate or lesions.Normal dentition.  Neck: normal, supple, no masses, no thyromegaly Respiratory: clear to auscultation bilaterally, no wheezing, no crackles. Normal respiratory effort. No accessory muscle use.  Cardiovascular: Irregular, no murmurs / rubs / gallops. No extremity edema. 2+ pedal pulses. No carotid bruits.  Abdomen: no tenderness, no masses palpated. No hepatosplenomegaly. Bowel  sounds positive.  Musculoskeletal: no clubbing / cyanosis. No joint deformity upper and lower extremities. Good ROM, no contractures. Normal muscle tone.  Skin: no rashes, lesions, ulcers. No induration Neurologic: CN 2-12 grossly intact. Sensation intact, DTR normal.  Muscle strength 5/5 on both sides Psychiatric: Normal judgment and insight. Alert and oriented x 3. Normal mood.     Condition at discharge: good  The results of significant diagnostics from this hospitalization (including imaging, microbiology, ancillary and laboratory) are listed below for reference.   Imaging Studies: DG Chest 1 View Result Date: 10/08/2023 CLINICAL DATA:  AFib EXAM: CHEST  1 VIEW COMPARISON:  10/24/2021 FINDINGS: Stable cardiomediastinal silhouette. Hyperinflation and chronic bronchitic change. No focal consolidation, pleural effusion, or pneumothorax. No displaced rib fractures. IMPRESSION: No acute process.  Emphysema. Electronically Signed   By: Norman Gatlin M.D.   On: 10/08/2023 01:24    Microbiology: Results for orders placed or performed in visit on 10/29/21  CULTURE, URINE COMPREHENSIVE     Status: None   Collection Time: 10/29/21 10:57 AM   Specimen: Urine   UR  Result Value Ref Range Status   Urine Culture, Comprehensive Final report  Final   Organism ID, Bacteria Comment  Final    Comment: Mixed urogenital flora 5,000  Colonies/mL   Microscopic Examination     Status: None   Collection Time: 10/29/21 10:57 AM   Urine  Result Value Ref Range Status   WBC, UA 0-5 0 - 5 /hpf Final   RBC, Urine 0-2 0 - 2 /hpf Final   Epithelial Cells (non renal) 0-10 0 - 10 /hpf Final   Bacteria, UA None seen None seen/Few Final    Labs: CBC: Recent Labs  Lab 10/08/23 0058  WBC 9.7  HGB 13.6  HCT 40.7  MCV 89.1  PLT 208   Basic Metabolic Panel: Recent Labs  Lab 10/08/23 0058  NA 142  K 3.5  CL 108  CO2 23  GLUCOSE 164*  BUN 17  CREATININE 0.85  CALCIUM  9.6  MG 2.0   Liver  Function Tests: No results for input(s): AST, ALT, ALKPHOS, BILITOT, PROT, ALBUMIN in the last 168 hours. CBG: No results for input(s): GLUCAP in the last 168 hours.  Discharge time spent: less than 30 minutes.  Signed: Cort ONEIDA Mana, MD [facility] 10/08/2023

## 2023-10-30 NOTE — Progress Notes (Unsigned)
 Cardiology Office Note    Date:  10/31/2023   ID:  Kristie Walters, DOB 22-Aug-1945, MRN [id number]  PCP:  Glenard Mire, MD  Cardiologist:  Evalene Lunger, MD  Electrophysiologist:  None   Chief Complaint: Follow-up  History of Present Illness:   Kristie Walters is a 78 y.o. female with history of CAD by coronary CTA, PAF diagnosed in 02/2019 on Eliquis , diastolic dysfunction, intermittent LBBB, COPD secondary to prior tobacco use with a 56-pack-year history quitting in 11/2017, carotid artery disease status post left-sided CEA in 08/2014 followed by vascular surgery with carotid artery ultrasound from 07/2023 showing bilateral 1 to 39% ICA stenosis, collagen vascular disease, fatty liver disease, and basal cell carcinoma of the nose status post Mohs procedure who presents for ED follow-up.  She was previously followed by Dr. Florencio though subsequently transitioned to Dr. Gollan in 09/2017.  Prior echo from 09/2015, done by outside cardiology group, showed an EF greater than 55%, mild LVH, normal RV systolic function, mild MR/TR.  Nuclear stress test at that time showed an EF of 77% with no evidence of significant ischemia or scar.  She was referred to [facility] in 09/2017 for evaluation of incidentally noted aortic atherosclerosis and coronary artery calcium  along the LAD on noninvasive imaging.  She was noted to not be very active at baseline secondary to back pain.  She did note some rare palpitations associated with stress that improved with rest.  Aggressive primary prevention was recommended.  She declined stress testing at that time.   She was seen in the ED in 02/2019 with palpitations and noted to be in new onset A. fib with RVR with LBBB (EMS EKG).  She had spontaneously converted to sinus rhythm in the field following IV metoprolol .  She was noted to have a new left bundle which persisted in sinus rhythm.  High-sensitivity troponin negative x2.  She was placed on metoprolol  and Eliquis .   Subsequent echo in 03/2019 showed an EF of 55 to 60%, no regional wall motion abnormalities, grade 2 diastolic dysfunction, normal RV systolic function and ventricular cavity size, normal size left atrium, and mildly elevated PASP.  Lexiscan  MPI on 01/11/2020 showed no evidence of significant ischemia or scar with an LVEF greater than 65%.  With administration of regadenoson  she developed a LBBB with known intermittent LBBB.  Attenuation corrected CT images noted aortic atherosclerosis with no significant coronary artery calcification.  Overall, this was a low risk study.  Zio patch showed a predominant rhythm of sinus with an average heart rate of 65 bpm.  First-degree AV block was noted.  18 episodes of SVT were noted with the longest interval lasting 10 beats with an average rate of 109 bpm and the fastest interval lasting 4 beats with a maximum rate of 158 bpm.  Isolated PACs, atrial couplets, atrial triplets, and PVCs were noted.  There were 6 patient triggered events and these were not associated with significant arrhythmia.  Echo in 02/2020 showed an EF of 55-60%, no RWMA, Gr1DD, normal RV systolic function and ventricular cavity size, and a PASP of 35.7 mmHg.   She was seen in the ED on 10/24/2021 with A-fib with RVR with rates ranging from 90 to 170 bpm with associated shortness of breath generalized weakness, fatigue, and chills.  EKG showed A-fib with RVR, 106 bpm, and known LBBB.  High-sensitivity troponin of 13 with a delta troponin of 20.  Chest x-ray showed emphysema with no acute cardiopulmonary process.  She spontaneously converted to sinus rhythm in the ED.  Coronary CTA on 11/15/2021 demonstrated a calcium  score of 170 which was the 67th percentile with 25 to 49% proximal LAD stenosis noted.   She was seen in the office on 10/23/2022 noting an increase in fatigue, shortness of breath, palpitations, and abdominal bloating.  Zio patch in 10/2022 showed a predominant rhythm of sinus with an average rate  of 70 bpm (range 49 to 148 bpm), intermittent bundle branch block, 18 episodes of SVT lasting up to 12.9 seconds, and rare atrial and ventricular ectopy.  Echo on 11/14/2022 showed an EF of 60 to 65%, no regional wall motion abnormalities, grade 1 diastolic dysfunction, normal RV systolic function and ventricular cavity size, mild mitral regurgitation, tricuspid aortic valve, and an estimated right atrial pressure of 3 mmHg.  In follow-up visits she has noted ongoing chronic fatigue and stable dyspnea.  She was admitted to the hospital and discharged on 10/08/2023 with chest discomfort, palpitations, and dyspnea consistent with prior known episodes of A-fib.  In the field, she was in A-fib with RVR with associated chest discomfort and dyspnea.  EKG showed rate controlled A-fib.  High-sensitivity troponin 11 with a delta and peak troponin of 26.  Chest x-ray showed no acute process with underlying emphysema.  She had been adherent to OAC.  She declined DCCV in the ED and was given 10 mg of IV diltiazem .  She was subsequently discharged by the hospitalist service.  She comes in today and is doing well from a cardiac perspective, without symptoms of angina or cardiac decompensation.  She indicates her son, who is dealing with leukemia status post bone marrow transplant passed away in late 07-26-2023.  She is coping with this, getting better each day.  She reports self discontinuing Trelegy with significant change in baseline dyspnea.  She has not had any further episodes of tachypalpitations concerning for breakthrough A-fib.  She has been without symptoms of chest pain since.  More active at baseline with a new cat.  No falls or symptoms concerning for bleeding.   Labs independently reviewed: 09/2023 - TSH 5.517, free T4 normal, magnesium  2.0, BNP 71, Hgb 13.6, PLT 208, potassium 3.5, BUN 17, serum creatinine 0.85 12/2022 - A1c 5.9 10/2022 - TC 120, TG 199, HDL 34, LDL 53, albumin 3.9, AST/ALT normal   Past Medical  History:  Diagnosis Date   Allergy    Atrial fibrillation (HCC)    Basal cell carcinoma of nose 08/12/2017   Carotid artery disease (HCC)    s/p L. CEA in July 2016   Centrilobular emphysema (HCC) 09/29/2017   Collagen vascular disease (HCC)    Left carotid stenosis   COPD (chronic obstructive pulmonary disease) (HCC)    Coronary artery disease 09/29/2017   Noted on chest CT July 2019   Dysrhythmia    Elevated blood pressure (not hypertension)    Fatty liver 09/29/2017   Chest CT July 2019   GERD (gastroesophageal reflux disease)    Hyperlipidemia    Personal history of tobacco use, presenting hazards to health 08/31/2015   PONV (postoperative nausea and vomiting)     Past Surgical History:  Procedure Laterality Date   BREAST BIOPSY Left 11/27/2022   stereo bx/ x clip/ path pending   BREAST BIOPSY Left 11/27/2022   MM LT BREAST BX W LOC DEV 1ST LESION IMAGE BX SPEC STEREO GUIDE 11/27/2022 [facility]   COLONOSCOPY WITH PROPOFOL  N/A 08/22/2020   Procedure: COLONOSCOPY WITH PROPOFOL ;  Surgeon: Jinny Carmine, MD;  Location: [facility] ENDOSCOPY;  Service: Endoscopy;  Laterality: N/A;   ENDARTERECTOMY Left 08/25/2014   Procedure: ENDARTERECTOMY CAROTID;  Surgeon: Selinda GORMAN Gu, MD;  Location: [facility] ORS;  Service: Vascular;  Laterality: Left;   MOHS SURGERY     MOHS SURGERY  09/23/2017   nose   PERIPHERAL VASCULAR CATHETERIZATION N/A 07/20/2014   Procedure: Carotid Angiography;  Surgeon: Selinda GORMAN Gu, MD;  Location: [facility] INVASIVE CV LAB;  Service: Cardiovascular;  Laterality: N/A;   TONSILLECTOMY     TUBAL LIGATION      Current Medications: Current Meds  Medication Sig   amLODipine  (NORVASC ) 5 MG tablet Take 1 tablet (5 mg total) by mouth as needed (AS NEEDED for SBP (top blood pressure number) greater than 140).   apixaban  (ELIQUIS ) 5 MG TABS tablet Take 1 tablet (5 mg total) by mouth 2 (two) times daily.   aspirin  81 MG tablet Take 81 mg by mouth daily.   atorvastatin  (LIPITOR)  40 MG tablet TAKE 1 TABLET BY MOUTH EVERY DAY   denosumab  (PROLIA ) 60 MG/ML SOSY injection Inject 60 mg into the skin every 6 (six) months.   ezetimibe  (ZETIA ) 10 MG tablet TAKE 1 TABLET BY MOUTH EVERY DAY   Fish Oil-Cholecalciferol (FISH OIL + D3 PO) Take 1,000 mg by mouth 1 day or 1 dose.   Fluticasone -Umeclidin-Vilant (TRELEGY ELLIPTA ) 100-62.5-25 MCG/ACT AEPB INHALE 1 PUFF BY MOUTH EVERY DAY   metoprolol  succinate (TOPROL -XL) 25 MG 24 hr tablet Take 1.5 tablets (37.5 mg total) by mouth daily.   metoprolol  tartrate (LOPRESSOR ) 25 MG tablet TAKE 1 TABLET (25 MG TOTAL) BY MOUTH 2 (TWO) TIMES DAILY AS NEEDED (AS NEEDED FOR PALPITATIONS).   pregabalin  (LYRICA ) 150 MG capsule Take 1 capsule (150 mg total) by mouth at bedtime.    Allergies:   Morphine , Morphine  and codeine, and Zoster vaccine live   Social History   Socioeconomic History   Marital status: Divorced    Spouse name: Not on file   Number of children: 2   Years of education: Not on file   Highest education level: 10th grade  Occupational History   Occupation: Retired  Tobacco Use   Smoking status: Former    Current packs/day: 0.00    Average packs/day: 1 pack/day for 55.0 years (55.0 ttl pk-yrs)    Types: Cigarettes    Start date: 11/26/1961    Quit date: 11/26/2016    Years since quitting: 6.9    Passive exposure: Past   Smokeless tobacco: Former    Types: Snuff   Tobacco comments:    smoking cessation materials not required  Vaping Use   Vaping status: Never Used  Substance and Sexual Activity   Alcohol use: No   Drug use: No   Sexual activity: Not Currently  Other Topics Concern   Not on file  Social History Narrative    Pt lives alone   Social Drivers of Health   Financial Resource Strain: Low Risk  (12/23/2022)   Received from [facility]   Overall Financial Resource Strain (CARDIA)    Difficulty of Paying Living Expenses: Not hard at all  Food Insecurity: No Food Insecurity (12/23/2022)    Received from [facility]   Hunger Vital Sign    Within the past 12 months, you worried that your food would run out before you got the money to buy more.: Never true    Within the past 12 months, the food you  bought just didn't last and you didn't have money to get more.: Never true  Transportation Needs: No Transportation Needs (12/23/2022)   Received from [facility] - Transportation    In the past 12 months, has lack of transportation kept you from medical appointments or from getting medications?: No    Lack of Transportation (Non-Medical): No  Physical Activity: Inactive (08/08/2022)   Exercise Vital Sign    Days of Exercise per Week: 0 days    Minutes of Exercise per Session: 0 min  Stress: Stress Concern Present (08/08/2022)   [employer] of Occupational [employer] - Occupational Stress Questionnaire    Feeling of Stress : To some extent  Social Connections: Moderately Isolated (08/08/2022)   Social Connection and Isolation Panel    Frequency of Communication with Friends and Family: More than three times a week    Frequency of Social Gatherings with Friends and Family: Three times a week    Attends Religious Services: More than 4 times per year    Active Member of Clubs or Organizations: No    Attends Banker Meetings: Never    Marital Status: Divorced     Family History:  The patient's family history includes Alzheimer's disease in her brother; CVA in her father; Dementia in her mother; Diabetes in her brother and brother; HIV in her son; Heart attack in her mother; Hypercholesterolemia in her mother; Hypertension in her mother; Hypothyroidism in her mother; Kidney cancer in her sister; Liver cancer in her father; Lymphoma in her son; Other in her brother; Peripheral vascular disease in her mother. There is no history of Breast cancer.  ROS:   12-point review of systems is negative unless otherwise noted in the  HPI.   EKGs/Labs/Other Studies Reviewed:    Studies reviewed were summarized above. The additional studies were reviewed today:  Zio patch 10/2022: Normal sinus rhythm Patient had a min HR of 49 bpm, max HR of 148 bpm, and avg HR of 70 bpm. Intermittent Bundle Branch Block was present.  18 Supraventricular Tachycardia runs occurred, the run with the fastest interval lasting 12.9 secs with a max rate of 148 bpm, the longest lasting 26 beats with an avg rate of 126 bpm.    Isolated SVEs were rare (<1.0%), SVE Couplets were rare (<1.0%), and SVE Triplets were rare (<1.0%).  Isolated VEs were rare (<1.0%), and no VE Couplets or VE Triplets were present.   Patient triggered events recorded __________   2D echo 11/14/2022: 1. Left ventricular ejection fraction, by estimation, is 60 to 65%. The  left ventricle has normal function. The left ventricle has no regional  wall motion abnormalities. Left ventricular diastolic parameters are  consistent with Grade I diastolic  dysfunction (impaired relaxation). The average left ventricular global  longitudinal strain is -19.6 %.   2. Right ventricular systolic function is normal. The right ventricular  size is normal. Tricuspid regurgitation signal is inadequate for assessing  PA pressure.   3. The mitral valve is normal in structure. Mild mitral valve  regurgitation. No evidence of mitral stenosis.   4. The aortic valve is tricuspid. Aortic valve regurgitation is not  visualized. No aortic stenosis is present.   5. The inferior vena cava is normal in size with greater than 50%  respiratory variability, suggesting right atrial pressure of 3 mmHg.  __________   Carotid artery ultrasound 07/31/2022: Summary:  Right Carotid: Velocities in the right ICA are consistent with a 1-39%  stenosis.  Non-hemodynamically significant plaque <50% noted in the CCA.   Left Carotid: Velocities in the left ICA are consistent with a 1-39% stenosis.    Vertebrals: Bilateral vertebral arteries demonstrate antegrade flow.  Subclavians: Normal flow hemodynamics were seen in bilateral subclavian arteries.  __________   Coronary CTA 11/15/2021: FINDINGS: Aorta: Normal size. Aortic root and descending aorta calcifications. No dissection.   Aortic Valve:  Trileaflet.  No calcifications.   Coronary Arteries:  Normal coronary origin.  Right dominance.   RCA is a dominant artery that gives rise to PDA and PLA. There is no plaque.   Left main gives rise to LAD and LCX arteries. There is no LM disease.   LAD has calcified plaque in the proximal segment causing mild stenosis (25-49%).   LCX is a non-dominant artery that gives rise to three obtuse marginal branches. There is no plaque.   Other findings:   Normal pulmonary vein drainage into the left atrium.   Normal left atrial appendage without a thrombus.   Normal size of the pulmonary artery.   IMPRESSION: 1. Coronary calcium  score of 170. This was 67th percentile for age and sex matched control. 2. Normal coronary origin with right dominance. 3. Calcified plaque in the proximal LAD causing mild stenosis (25-49%) 4. CAD-RADS 2. Mild non-obstructive CAD (25-49%). Consider non-atherosclerotic causes of chest pain. Consider preventive therapy and risk factor modification. __________   2D echo 09/04/2021: 1. Left ventricular ejection fraction, by estimation, is 55 to 60%. The  left ventricle has normal function. The left ventricle has no regional  wall motion abnormalities. Left ventricular diastolic parameters are  consistent with Grade I diastolic  dysfunction (impaired relaxation).   2. Right ventricular systolic function is normal. The right ventricular  size is normal. Tricuspid regurgitation signal is inadequate for assessing  PA pressure.   3. The mitral valve is normal in structure. No evidence of mitral valve  regurgitation. No evidence of mitral stenosis.   4.  The aortic valve was not well visualized. Aortic valve regurgitation  is not visualized. No aortic stenosis is present.   5. The inferior vena cava is normal in size with greater than 50%  respiratory variability, suggesting right atrial pressure of 3 mmHg.   Comparison(s): 02/2020-EF 55-60%. __________   Carotid artery ultrasound 07/20/2021: Summary:  Right Carotid: Velocities in the right ICA are consistent with a 1-39% stenosis.   Left Carotid: Velocities in the left ICA are consistent with a 1-39%  stenosis.   Vertebrals:  Bilateral vertebral arteries demonstrate antegrade flow.  Subclavians: Normal flow hemodynamics were seen in bilateral subclavian arteries. ___________   2D echo 03/09/2020: 1. Left ventricular ejection fraction, by estimation, is 55 to 60%. The  left ventricle has normal function. The left ventricle has no regional  wall motion abnormalities. Left ventricular diastolic parameters are  consistent with Grade I diastolic  dysfunction (impaired relaxation).   2. Right ventricular systolic function is normal. The right ventricular  size is normal. There is normal pulmonary artery systolic pressure. The  estimated right ventricular systolic pressure is 35.7 mmHg.   3. The aortic valve was not well visualized. __________   Lexiscan  MPI 01/11/2020: Normal pharmacologic myocardial perfusion stress test without evidence of significant ischemia or scar. The left ventricular ejection fraction is hyperdynamic (>65%). Left bundle branch block developed after administration of regadenoson . Patient known to have intermittent LBBB. Attenuation correction CT is notable for aortic atherosclerosis. There is no significant coronary artery calcification. This is  a low risk study. __________   Zio patch 12/2019: Normal sinus rhythm avg HR of 65 bpm.    Patient triggered events (6) were not associated with significant arrhythmia.   First Degree AV Block was present.  18  Supraventricular Tachycardia runs occurred, the run with the fastest interval lasting 4 beats with a max rate of 158 bpm, the longest lasting 10 beats with an avg rate of 109 bpm.   Isolated SVEs were rare (<1.0%), SVE Couplets were rare (<1.0%), and SVE Triplets were rare (<1.0%). Isolated VEs were rare (<1.0%), and no VE Couplets or VE Triplets were present. __________   2D echo 03/2019: 1. Left ventricular ejection fraction, by visual estimation, is 55 to  60%. The left ventricle has normal function. There is no left ventricular  hypertrophy.   2. Left ventricular diastolic parameters are consistent with Grade II  diastolic dysfunction (pseudonormalization).   3. The left ventricle has no regional wall motion abnormalities.   4. Global right ventricle has normal systolic function.The right  ventricular size is normal. No increase in right ventricular wall  thickness.   5. Left atrial size was normal.   6. Mildly elevated pulmonary artery systolic pressure.   EKG:  EKG is ordered today.  The EKG ordered today demonstrates NSR, 69 bpm, first-degree AV block, no acute ST-T changes  Recent Labs: 10/08/2023: B Natriuretic Peptide 71.8; BUN 17; Creatinine, Ser 0.85; Hemoglobin 13.6; Magnesium  2.0; Platelets 208; Potassium 3.5; Sodium 142; TSH 5.517  Recent Lipid Panel    Component Value Date/Time   CHOL 120 10/23/2022 1233   TRIG 199 (H) 10/23/2022 1233   HDL 34 (L) 10/23/2022 1233   CHOLHDL 3.5 10/23/2022 1233   CHOLHDL 4.2 04/18/2021 1423   LDLCALC 53 10/23/2022 1233   LDLCALC 79 04/18/2021 1423    PHYSICAL EXAM:    VS:  BP 130/65 (BP Location: Left Arm, Patient Position: Sitting, Cuff Size: Normal)   Pulse 69   Ht 5' 11 (1.803 m)   Wt 170 lb 9.6 oz (77.4 kg)   SpO2 97%   BMI 23.79 kg/m   BMI: Body mass index is 23.79 kg/m.  Physical Exam Vitals reviewed.  Constitutional:      Appearance: She is well-developed.  HENT:     Head: Normocephalic and atraumatic.  Eyes:      General:        Right eye: No discharge.        Left eye: No discharge.  Cardiovascular:     Rate and Rhythm: Normal rate and regular rhythm.     Heart sounds: Normal heart sounds, S1 normal and S2 normal. Heart sounds not distant. No midsystolic click and no opening snap. No murmur heard.    No friction rub.  Pulmonary:     Effort: Pulmonary effort is normal. No respiratory distress.     Breath sounds: Normal breath sounds. No decreased breath sounds, wheezing, rhonchi or rales.  Musculoskeletal:     Cervical back: Normal range of motion.  Skin:    General: Skin is warm and dry.     Nails: There is no clubbing.  Neurological:     Mental Status: She is alert and oriented to person, place, and time.  Psychiatric:        Speech: Speech normal.        Behavior: Behavior normal.        Thought Content: Thought content normal.        Judgment: Judgment normal.  Wt Readings from Last 3 Encounters:  10/31/23 170 lb 9.6 oz (77.4 kg)  10/08/23 170 lb (77.1 kg)  08/20/23 168 lb (76.2 kg)     ASSESSMENT & PLAN:   Nonobstructive CAD involving the native coronary arteries without angina with chronic dyspnea and elevated high-sensitivity troponin: Currently without symptoms of angina or cardiac decompensation.  Recently noted to have elevated troponins felt to be reflective of supply/demand ischemia in the setting of A-fib with RVR.  Prior coronary CTA in 2023 showed nonobstructive disease involving the LAD EF up to 49%.  Given episode of chest pain associated with A-fib, and in the context of elevated troponin, schedule Lexiscan  MPI to evaluate for high risk ischemia.  Otherwise, she continues on aspirin  81 mg, atorvastatin  40 mg, ezetimibe  10 mg, and Toprol -XL 37.5 mg twice daily.  PAF/PSVT: Recently seen in the ED with episode of symptomatic A-fib, maintaining sinus rhythm at this time.  Episode may have occurred in the setting of increased stress following the passing of her son who  was chronically ill earlier this spring/summer.  Remains on Toprol -XL 37.5 mg daily with as needed Lopressor  as needed for breakthrough palpitations.  Continue to monitor, consider repeat outpatient cardiac monitoring if she has further paroxysms concerning for A-fib.  CHA2DS2-VASc at least 5.  Remains on apixaban  5 mg twice daily and does not meet reduced dosing criteria.  No falls or symptoms concerning for bleeding.  Recent labs stable.  Diastolic dysfunction: Euvolemic and well compensated with grade 1 diastolic dysfunction on most recent echo.  Normal right atrial pressure.  Not requiring a standing loop diuretic.  Continue optimal blood pressure control.  Intermittent LBBB: Asymptomatic.  Echo with preserved LV systolic function.  HTN: Blood pressure is reasonably controlled in the office today.  She remains on Toprol -XL 37.5 mg daily and as needed amlodipine  5 mg for systolic blood pressure greater than 140 mmHg.  HLD: LDL 53 in 10/2022.  Remains on atorvastatin  40 mg and ezetimibe  10 mg.  Anticipate fasting labs at next visit.  Carotid artery disease: Status post left-sided CEA on 08/2014.  Stable 1 to 39% bilateral ICA stenoses by ultrasound in 07/2023.  Remains on aspirin , atorvastatin , and ezetimibe .  Followed by vascular surgery.      Disposition: F/u with Dr. Gollan or an APP in 2 months.   Medication Adjustments/Labs and Tests Ordered: Current medicines are reviewed at length with the patient today.  Concerns regarding medicines are outlined above. Medication changes, Labs and Tests ordered today are summarized above and listed in the Patient Instructions accessible in Encounters.   Signed, Bernardino Bring, PA-C 10/31/2023 3:46 PM     [facility] [place] [address] [place], [place] [address] [phone]

## 2023-10-31 ENCOUNTER — Encounter: Payer: Self-pay | Admitting: Physician Assistant

## 2023-10-31 ENCOUNTER — Ambulatory Visit: Attending: Physician Assistant | Admitting: Physician Assistant

## 2023-10-31 VITALS — BP 130/65 | HR 69 | Ht 71.0 in | Wt 170.6 lb

## 2023-10-31 DIAGNOSIS — R072 Precordial pain: Secondary | ICD-10-CM

## 2023-10-31 DIAGNOSIS — I5189 Other ill-defined heart diseases: Secondary | ICD-10-CM | POA: Diagnosis not present

## 2023-10-31 DIAGNOSIS — I471 Supraventricular tachycardia, unspecified: Secondary | ICD-10-CM | POA: Diagnosis not present

## 2023-10-31 DIAGNOSIS — I2510 Atherosclerotic heart disease of native coronary artery without angina pectoris: Secondary | ICD-10-CM | POA: Diagnosis not present

## 2023-10-31 DIAGNOSIS — R7989 Other specified abnormal findings of blood chemistry: Secondary | ICD-10-CM

## 2023-10-31 DIAGNOSIS — I447 Left bundle-branch block, unspecified: Secondary | ICD-10-CM

## 2023-10-31 DIAGNOSIS — E785 Hyperlipidemia, unspecified: Secondary | ICD-10-CM

## 2023-10-31 DIAGNOSIS — I480 Paroxysmal atrial fibrillation: Secondary | ICD-10-CM

## 2023-10-31 DIAGNOSIS — I6523 Occlusion and stenosis of bilateral carotid arteries: Secondary | ICD-10-CM

## 2023-10-31 NOTE — Patient Instructions (Signed)
 Medication Instructions:  Your physician recommends that you continue on your current medications as directed. Please refer to the Current Medication list given to you today.   *If you need a refill on your cardiac medications before your next appointment, please call your pharmacy*  Lab Work: None ordered at this time   Testing/Procedures: Your provider has ordered a Lexiscan / Exercise Myoview  Stress test. This will take place at [facility]. Please report to the [facility] medical mall entrance. The volunteers at the first desk will direct you where to go.  [facility] MYOVIEW   Your provider has ordered a Stress Test with nuclear imaging. The purpose of this test is to evaluate the blood supply to your heart muscle. This procedure is referred to as a Non-Invasive Stress Test. This is because other than having an IV started in your vein, nothing is inserted or invades your body. Cardiac stress tests are done to find areas of poor blood flow to the heart by determining the extent of coronary artery disease (CAD). Some patients exercise on a treadmill, which naturally increases the blood flow to your heart, while others who are unable to walk on a treadmill due to physical limitations will have a pharmacologic/chemical stress agent called Lexiscan  . This medicine will mimic walking on a treadmill by temporarily increasing your coronary blood flow.   Please note: these test may take anywhere between 2-4 hours to complete  How to prepare for your Myoview  test:  Nothing to eat for 6 hours prior to the test No caffeine for 24 hours prior to test No smoking 24 hours prior to test. Your medication may be taken with water.  If your doctor stopped a medication because of this test, do not take that medication. Ladies, please do not wear dresses.  Skirts or pants are appropriate. Please wear a short sleeve shirt. No perfume, cologne or lotion. Wear comfortable walking shoes. No heels!   PLEASE NOTIFY THE OFFICE AT  LEAST 24 HOURS IN ADVANCE IF YOU ARE UNABLE TO KEEP YOUR APPOINTMENT.  [phone] AND  PLEASE NOTIFY NUCLEAR MEDICINE AT [facility] AT LEAST 24 HOURS IN ADVANCE IF YOU ARE UNABLE TO KEEP YOUR APPOINTMENT. [phone]   Follow-Up: At [facility], you and your health needs are our priority.  As part of our continuing mission to provide you with exceptional heart care, our providers are all part of one team.  This team includes your primary Cardiologist (physician) and Advanced Practice Providers or APPs (Physician Assistants and Nurse Practitioners) who all work together to provide you with the care you need, when you need it.  Your next appointment:   2 month(s)  Provider:   You may see Timothy Gollan, MD or Bernardino Bring, PA-C  We recommend signing up for the patient portal called MyChart.  Sign up information is provided on this After Visit Summary.  MyChart is used to connect with patients for Virtual Visits (Telemedicine).  Patients are able to view lab/test results, encounter notes, upcoming appointments, etc.  Non-urgent messages can be sent to your provider as well.   To learn more about what you can do with MyChart, go to [email or web address].

## 2023-11-11 ENCOUNTER — Other Ambulatory Visit: Payer: Self-pay | Admitting: Physician Assistant

## 2023-11-11 ENCOUNTER — Ambulatory Visit: Payer: Self-pay | Admitting: Physician Assistant

## 2023-11-11 ENCOUNTER — Encounter
Admission: RE | Admit: 2023-11-11 | Discharge: 2023-11-11 | Disposition: A | Discharge status: Home / Self Care | Source: Ambulatory Visit | Attending: Physician Assistant | Admitting: Physician Assistant

## 2023-11-11 DIAGNOSIS — R072 Precordial pain: Secondary | ICD-10-CM | POA: Insufficient documentation

## 2023-11-11 LAB — NM MYOCAR MULTI W/SPECT W/WALL MOTION / EF
LV dias vol: 30 mL (ref 46–106)
LV sys vol: 10 mL (ref 3.8–5.2)
MPHR: 142 {beats}/min
Nuc Stress EF: 67 %
Peak HR: 80 {beats}/min
Percent HR: 56 %
Rest HR: 51 {beats}/min
Rest Nuclear Isotope Dose: 10.3 mCi
SDS: 0
SRS: 0
SSS: 0
ST Depression (mm): 0 mm
Stress Nuclear Isotope Dose: 28.9 mCi
TID: 1.03

## 2023-11-11 MED ORDER — REGADENOSON 0.4 MG/5ML IV SOLN
0.4000 mg | Freq: Once | INTRAVENOUS | Status: AC
Start: 2023-11-11 — End: 2023-11-11
  Administered 2023-11-11: 0.4 mg via INTRAVENOUS

## 2023-11-11 MED ORDER — TECHNETIUM TC 99M TETROFOSMIN IV KIT
10.0000 | PACK | Freq: Once | INTRAVENOUS | Status: AC | PRN
Start: 2023-11-11 — End: 2023-11-11
  Administered 2023-11-11: 10.28 via INTRAVENOUS

## 2023-11-11 MED ORDER — TECHNETIUM TC 99M TETROFOSMIN IV KIT
30.0000 | PACK | Freq: Once | INTRAVENOUS | Status: AC | PRN
  Administered 2023-11-11: 28.9 via INTRAVENOUS

## 2023-11-11 NOTE — Progress Notes (Signed)
     Rock ORN Bohl presented for a nuclear stress test today.  I Lesley LITTIE Maffucci, PA-C, provided direct supervision and was present during the stress portion of the study today, which was completed without significant symptoms, immediate complications, or acute ST/T changes on ECG.  Stress imaging is pending at this time.  Preliminary ECG findings may be listed in the chart, but the stress test result will not be finalized until perfusion imaging is complete.  Lesley LITTIE Maffucci, PA-C  11/11/2023, 10:52 AM

## 2023-11-19 DIAGNOSIS — G4731 Primary central sleep apnea: Secondary | ICD-10-CM

## 2023-11-19 HISTORY — DX: Primary central sleep apnea: G47.31

## 2023-11-20 ENCOUNTER — Encounter: Payer: Self-pay | Admitting: Pulmonary Disease

## 2023-11-20 ENCOUNTER — Ambulatory Visit: Admitting: Pulmonary Disease

## 2023-11-20 VITALS — BP 140/80 | HR 65 | Temp 97.6°F | Ht 71.0 in | Wt 171.2 lb

## 2023-11-20 DIAGNOSIS — J4489 Other specified chronic obstructive pulmonary disease: Secondary | ICD-10-CM

## 2023-11-20 DIAGNOSIS — Z9189 Other specified personal risk factors, not elsewhere classified: Secondary | ICD-10-CM | POA: Diagnosis not present

## 2023-11-20 DIAGNOSIS — I480 Paroxysmal atrial fibrillation: Secondary | ICD-10-CM

## 2023-11-20 MED ORDER — SPIRIVA RESPIMAT 2.5 MCG/ACT IN AERS: 2.0000 | INHALATION_SPRAY | Freq: Every day | RESPIRATORY_TRACT | 11 refills | Status: AC

## 2023-11-20 MED ORDER — LEVALBUTEROL TARTRATE 45 MCG/ACT IN AERO: 2.0000 | INHALATION_SPRAY | Freq: Four times a day (QID) | RESPIRATORY_TRACT | 2 refills | Status: AC | PRN

## 2023-11-20 MED ORDER — SPIRIVA RESPIMAT 2.5 MCG/ACT IN AERS: 2.0000 | INHALATION_SPRAY | Freq: Every day | RESPIRATORY_TRACT | Status: DC

## 2023-11-20 NOTE — Progress Notes (Signed)
 Subjective:    Patient ID: Kristie Walters, female    DOB: Jun 27, 1945, 78 y.o.   MRN: [id number]  Patient Care Team: Sowles, Krichna, MD as PCP - General (Family Medicine) Perla Evalene PARAS, MD as PCP - Cardiology (Cardiology) Marea Selinda RAMAN, MD as Consulting Physician (Vascular Surgery) Alpha Chloe SAUNDERS, MD as Referring Physician (Dermatology) Perla Evalene PARAS, MD as Consulting Physician (Cardiology)  Chief Complaint  Patient presents with   COPD    She reports stopping Trelegy a few weeks ago (10/08/2023.)     BACKGROUND/INTERVAL:78 year old former smoker (quit 2018) with a 55-pack-year history of smoking presents for follow-up of shortness of breath and emphysema.  She was last seen on 20 August 2023, does not endorse any hospitalizations since then.   HPI Discussed the use of AI scribe software for clinical note transcription with the patient, who gave verbal consent to proceed.  History of Present Illness   Kristie Walters is a 78 year old female with COPD who presents with concerns about her breathing and medication management.  Her breathing has improved recently after she stopped taking Trelegy. She has become more active, partly due to caring for a new kitten, which has kept her occupied since the passing of her son on Memorial Day. The kitten seems to sense when something is 'off' with her.  She experiences breathing difficulties primarily at night when lying down. She feels fine during the day, even when active. She had an episode of atrial fibrillation a few weeks ago, which she associates with forgetting to take her Trelegy inhaler earlier that day. She took it later in the evening but experienced difficulty breathing when she went to bed, which she believes triggered the AFib episode. Since then, she has not used Trelegy.  She has a history of atrial fibrillation and is concerned about the effects of her inhaler on her heart. She uses a rescue inhaler as needed. She has  previously discussed the possibility of sleep apnea with her heart doctor, who mentioned she did not meet all the criteria for testing at that time. However, her current history is concerning for the possibility of her having  sleep apnea.  No knowledge of snoring. Feels fine during the day but experiences breathing difficulties at night when lying down.     DATA: 03/26/2019 2D echo: G2 DD, mild elevation of pulmonary artery systolic pressure, LVEF 55 to 39% 09/16/2019 low-dose lung cancer screening CT: Multiple small inflammatory groundglass nodules, mild diffuse bronchial wall thickening with mild centrilobular and paraseptal emphysema, no evidence of fibrosis 03/28/2020 PFTs: FEV1 1.68 L or 59% predicted, FVC 2.06 L or 54% predicted, FEV1/FVC 82%, no bronchodilator response.  Lung volumes moderately reduced with ERV at 14% consistent with obesity.  Diffusion capacity moderately reduced and corrects for alveolar volume. 02/14/2021 LDCT: Lung RADS 2S, small solid and subsolid pulmonary nodules scattered throughout the lungs similar in size, number and distribution to prior study.  Largest is on the left lower lobe subsolid 10.9 mm in size with no change.  Extensive bilateral apical nodular pleural-parenchymal thickening and architectural distortion similar to prior. 05/15/2023 PFTs: FEV1 1.52 L or 55% predicted, FVC 1.86 L or 51% predicted, no bronchodilator response, lung volumes mildly decreased diffusion capacity moderately reduced consistent with combined obstructive/restrictive defect.  With no overt significant change from prior.  Review of Systems A 10 point review of systems was performed and it is as noted above otherwise negative.   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   Fatty liver 09/29/2017   Coronary artery disease 09/29/2017   Basal cell carcinoma of nose 08/12/2017   Estrogen deficiency 08/01/2017   Aortic atherosclerosis 09/14/2015   Personal history of tobacco use, presenting hazards to health 08/31/2015   Paresthesia of foot, bilateral 08/15/2015   Breast mass, right 07/12/2015   Elevated alkaline phosphatase level 05/29/2015   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   Gastroesophageal reflux disease without esophagitis 04/26/2015   Hyperlipidemia 04/26/2015   Hyperglycemia 04/26/2015   Carotid stenosis 08/25/2014   H/O malignant neoplasm of skin 08/13/2012    Social History   Tobacco Use   Smoking status: Former    Current packs/day: 0.00    Average packs/day: 1 pack/day for 55.0 years (55.0 ttl pk-yrs)    Types: Cigarettes    Start date: 11/26/1961    Quit date: 11/26/2016    Years since quitting: 6.9    Passive exposure: Past   Smokeless tobacco: Former    Types: Snuff   Tobacco comments:    smoking cessation materials not required  Substance Use Topics   Alcohol use: No    Allergies  Allergen Reactions   Morphine  Nausea Only and Nausea And Vomiting   Morphine  And Codeine Nausea And Vomiting   Zoster Vaccine Live Itching and Rash    Current Meds  Medication Sig   apixaban  (ELIQUIS ) 5 MG TABS tablet Take 1 tablet (5 mg total) by mouth 2 (two) times daily.   aspirin  81 MG tablet Take 81 mg by mouth daily.   atorvastatin  (LIPITOR) 40 MG tablet TAKE 1 TABLET BY MOUTH EVERY DAY   denosumab  (PROLIA ) 60 MG/ML SOSY injection Inject 60 mg into the skin every 6 (six) months.   ezetimibe  (ZETIA ) 10 MG tablet TAKE 1 TABLET BY MOUTH EVERY DAY    Fish Oil-Cholecalciferol (FISH OIL + D3 PO) Take 1,000 mg by mouth 1 day or 1 dose.   levalbuterol  (XOPENEX  HFA) 45 MCG/ACT inhaler Inhale 2 puffs into the lungs every 6 (six) hours as needed for wheezing or shortness of breath.   metoprolol  succinate (TOPROL -XL) 25 MG 24 hr tablet Take 1.5 tablets (37.5 mg total) by mouth daily.   metoprolol  tartrate (LOPRESSOR ) 25 MG tablet TAKE 1 TABLET (25 MG TOTAL) BY MOUTH 2 (TWO) TIMES DAILY AS NEEDED (AS NEEDED FOR PALPITATIONS).   pregabalin  (LYRICA ) 150 MG capsule Take 1 capsule (150 mg total) by mouth at bedtime.   [START ON 12/15/2023] Tiotropium Bromide Monohydrate  (SPIRIVA  RESPIMAT) 2.5 MCG/ACT AERS Inhale 2 puffs into the lungs daily.   Tiotropium Bromide Monohydrate  (SPIRIVA  RESPIMAT) 2.5 MCG/ACT AERS Inhale 2 puffs into the lungs daily.    Immunization History  Administered Date(s) Administered    sv, Bivalent, Protein Subunit Rsvpref,pf [place]) 12/26/2022   Fluad Quad(high Dose 65+) 11/03/2021, 10/24/2022   INFLUENZA, HIGH DOSE SEASONAL PF 10/08/2016, 10/01/2018, 10/08/2019, 11/01/2020   Influenza-Unspecified 11/19/2014, 11/16/2015, 10/08/2016, 09/18/2017   Moderna Covid-19 Vaccine Bivalent Booster 55yrs & up 11/15/2020, 12/05/2021   Moderna  Sars-Covid-2 Vaccination 01/05/2020, 07/27/2020   PFIZER(Purple Top)SARS-COV-2 Vaccination 04/21/2019, 05/12/2019   Pneumococcal Conjugate-13 11/18/2016   Pneumococcal Polysaccharide-23 06/11/2012, 06/01/2020   Td 02/18/2009   Td (Adult), 2 Lf Tetanus Toxid, Preservative Free 02/18/2009   Tdap 04/25/2021   Zoster Recombinant(Shingrix ) 04/25/2021        Objective:     BP (!) 140/80   Pulse 65   Temp 97.6 F (36.4 C) (Temporal)   Ht 5' 11 (1.803 m)   Wt 171 lb 3.2 oz (77.7 kg)   SpO2 96%   BMI 23.88 kg/m   SpO2: 96 %  GENERAL: Well-developed well-nourished woman in no acute distress.  She ambulates with assistance of a cane. HEAD: Normocephalic, atraumatic.  EYES: Pupils equal,  round, reactive to light.  No scleral icterus.  MOUTH: Upper dentures, oral mucosa moist.  No thrush. NECK: Supple. No thyromegaly. Trachea midline. No JVD.  No adenopathy. PULMONARY: Good air entry bilaterally.  Coarse breath sounds otherwise no adventitious sounds. CARDIOVASCULAR: S1 and S2. Regular rate and rhythm.  No rubs, murmurs or gallops heard. ABDOMEN: Benign. MUSCULOSKELETAL: No joint deformity, no clubbing, no edema.  NEUROLOGIC: No focal deficit noted, speech is fluent.  Gait steady with assistance (cane). SKIN: Intact,warm,dry. PSYCH: Normal mood, normal behavior.     11/20/2023   11:43 AM  Results of the Epworth flowsheet  Sitting and reading 0  Watching TV 2  Sitting, inactive in a public place (e.g. a theatre or a meeting) 0  As a passenger in a car for an hour without a break 0  Lying down to rest in the afternoon when circumstances permit 3  Sitting and talking to someone 0  Sitting quietly after a lunch without alcohol 0  In a car, while stopped for a few minutes in traffic 0  Total score 5         Assessment & Plan:     ICD-10-CM   1. COPD with chronic bronchitis and emphysema (HCC)  J44.89    J43.9     2. Paroxysmal A-fib (HCC)  I48.0     3. At risk for sleep apnea  Z91.89 Home sleep test      Orders Placed This Encounter  Procedures   Home sleep test    Standing Status:   Future    Expected Date:   12/04/2023    Expiration Date:   11/19/2024    Where should this test be performed::   [facility] - Pulmonary    Meds ordered this encounter  Medications   levalbuterol  (XOPENEX  HFA) 45 MCG/ACT inhaler    Sig: Inhale 2 puffs into the lungs every 6 (six) hours as needed for wheezing or shortness of breath.    Dispense:  1 each    Refill:  2   Tiotropium Bromide Monohydrate  (SPIRIVA  RESPIMAT) 2.5 MCG/ACT AERS    Sig: Inhale 2 puffs into the lungs daily.    Dispense:  4 g    Refill:  11   Tiotropium Bromide Monohydrate  (SPIRIVA  RESPIMAT) 2.5 MCG/ACT  AERS    Sig: Inhale 2 puffs into the lungs daily.    Dispense:  2 each    Lot Number?:   [id number]    Expiration Date?:   01/17/2025    Quantity:   2   Discussion    Chronic obstructive pulmonary disease (COPD) COPD symptoms have improved with increased activity. She discontinued Trelegy due to concerns about its cardiac effects, particularly after an episode of atrial fibrillation (AFib). -  Prescribe Spiriva  2.5 mcg, two puffs once daily. - Provide samples of Spiriva  and demonstrate its use. - Switch rescue inhaler to Xopenex , two puffs every six hours as needed.  Atrial fibrillation Recent episode of AFib occurred after missing a dose of Trelegy and redosing subsequently experiencing dyspnea. The switch from Trelegy to Spiriva  aims to avoid exacerbating AFib.  Suspected sleep apnea Suspected sleep apnea due to nocturnal breathing difficulties, despite feeling well during the day. - Order home sleep study.      Advised if symptoms do not improve or worsen, to please contact office for sooner follow up or seek emergency care.    I spent 30 minutes of dedicated to the care of this patient on the date of this encounter to include pre-visit review of records, face-to-face time with the patient discussing conditions above, post visit ordering of testing, clinical documentation with the electronic health record, making appropriate referrals as documented, and communicating necessary findings to members of the patients care team.     C. Leita Sanders, MD Advanced Bronchoscopy PCCM [facility]-    *This note was generated using voice recognition software/Dragon and/or AI transcription program.  Despite best efforts to proofread, errors can occur which can change the meaning. Any transcriptional errors that result from this process are unintentional and may not be fully corrected at the time of dictation.

## 2023-11-20 NOTE — Patient Instructions (Signed)
 VISIT SUMMARY:  Today, we discussed your breathing concerns and medication management. You mentioned that your breathing has improved since stopping Trelegy, and you have become more active, especially with the new kitten. However, you still experience breathing difficulties at night and had a recent episode of atrial fibrillation. We also talked about the possibility of sleep apnea.  YOUR PLAN:  -CHRONIC OBSTRUCTIVE PULMONARY DISEASE (COPD): COPD is a chronic lung condition that makes it hard to breathe. Your symptoms have improved with increased activity, and we have decided to switch your medication from Trelegy to Spiriva  to avoid any potential cardiac effects. You should take Spiriva  2.5 mcg, two puffs once daily. We also provided samples and demonstrated its use. Additionally, your rescue inhaler has been switched to Xopenex , which you should use two puffs every six hours as needed.  -ATRIAL FIBRILLATION: Atrial fibrillation is an irregular and often rapid heart rate that can increase your risk of strokes and other heart-related complications. You had a recent episode after missing a dose of Trelegy. To avoid exacerbating your AFib, we have switched your medication to Spiriva .  -SUSPECTED SLEEP APNEA: Sleep apnea is a condition where your breathing repeatedly stops and starts during sleep. Due to your nocturnal breathing difficulties, we have ordered a home sleep study to investigate this further.  INSTRUCTIONS:  Please follow up with the home sleep study as ordered. Continue using Spiriva  and Xopenex  as directed. If you experience any new or worsening symptoms, please contact our office.

## 2023-11-28 ENCOUNTER — Encounter: Payer: Self-pay | Admitting: Family Medicine

## 2023-11-28 ENCOUNTER — Ambulatory Visit: Admitting: Family Medicine

## 2023-11-28 VITALS — BP 128/74 | HR 62 | Resp 18 | Ht 71.0 in | Wt 170.2 lb

## 2023-11-28 DIAGNOSIS — Z1231 Encounter for screening mammogram for malignant neoplasm of breast: Secondary | ICD-10-CM

## 2023-11-28 DIAGNOSIS — J4489 Other specified chronic obstructive pulmonary disease: Secondary | ICD-10-CM

## 2023-11-28 DIAGNOSIS — I700 Atherosclerosis of aorta: Secondary | ICD-10-CM

## 2023-11-28 DIAGNOSIS — I471 Supraventricular tachycardia, unspecified: Secondary | ICD-10-CM | POA: Diagnosis not present

## 2023-11-28 DIAGNOSIS — Z79899 Other long term (current) drug therapy: Secondary | ICD-10-CM

## 2023-11-28 DIAGNOSIS — J439 Emphysema, unspecified: Secondary | ICD-10-CM

## 2023-11-28 DIAGNOSIS — I25119 Atherosclerotic heart disease of native coronary artery with unspecified angina pectoris: Secondary | ICD-10-CM | POA: Diagnosis not present

## 2023-11-28 DIAGNOSIS — R7989 Other specified abnormal findings of blood chemistry: Secondary | ICD-10-CM | POA: Insufficient documentation

## 2023-11-28 DIAGNOSIS — I480 Paroxysmal atrial fibrillation: Secondary | ICD-10-CM

## 2023-11-28 DIAGNOSIS — M810 Age-related osteoporosis without current pathological fracture: Secondary | ICD-10-CM

## 2023-11-28 DIAGNOSIS — E538 Deficiency of other specified B group vitamins: Secondary | ICD-10-CM

## 2023-11-28 DIAGNOSIS — K219 Gastro-esophageal reflux disease without esophagitis: Secondary | ICD-10-CM

## 2023-11-28 DIAGNOSIS — E038 Other specified hypothyroidism: Secondary | ICD-10-CM

## 2023-11-28 DIAGNOSIS — G629 Polyneuropathy, unspecified: Secondary | ICD-10-CM

## 2023-11-28 NOTE — Progress Notes (Signed)
 Name: Kristie Walters   MRN: [id number]    DOB: 07-30-1945   Date:11/28/2023       Progress Note  Subjective  Chief Complaint  Chief Complaint  Patient presents with   Medical Management of Chronic Issues   Discussed the use of AI scribe software for clinical note transcription with the patient, who gave verbal consent to proceed.  History of Present Illness Kristie Walters is a 78 year old female with coronary artery disease and paroxysmal atrial fibrillation who presents with recent chest pain and breathing difficulties.  She recently experienced chest pain and breathing difficulties, prompting a visit to the emergency room. She had taken her Trelegy inhaler late in the evening, which she usually takes in the morning, and subsequently experienced difficulty breathing upon going to bed. She took metoprolol  for her atrial fibrillation, which exacerbated her symptoms, leading to a sensation of her heart 'coming out of her chest.' EMS was called, and she was observed in the hospital overnight. She was given four baby aspirins and discharged the next day after her troponin levels, which initially were negative, showed a slight increase. Her EKG showed extra beats but no atrial fibrillation.  She has a history of coronary artery disease and paroxysmal atrial fibrillation, diagnosed in 2021. Her current medications include aspirin  81 mg, atorvastatin  40 mg, ezetimibe  10 mg, and metoprolol  succinate 37.5 mg daily, taken as one and a half 25 mg tablets. She also has a prescription for Lopressor  to use as needed for palpitations. Her blood pressure occasionally rises, for which she takes amlodipine  as needed when the systolic pressure exceeds 140 mmHg.  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 She recently switched from Trelegy to Frova inhaler, which she finds effective after getting accustomed to its use. She experiences wheezing and a 'popping or cracking'  sound when lying flat at night, which improves when she takes a deep breath. She experiences more symptoms when lying flat and does not find relief by reclining.  She has a history of diastolic dysfunction, intermittent left bundle branch block, and age-related osteoporosis, for which she receives Prolia  injections. She also has sensorimotor neuropathy and uses a cane for balance. Her reflux symptoms have improved with dietary modifications.  Her family history includes the recent passing of her son from cutaneous lymphoma at age 76. She has a daughter named Arne. Socially, she has found comfort in adopting a black cat, which has been a source of companionship and activity.  She experiences wheezing and breathing difficulties when lying flat, which improve when reclining. No current heartburn or indigestion.    Patient Active Problem List   Diagnosis Date Noted   Paroxysmal SVT (supraventricular tachycardia) 01/22/2023   Gait difficulty 12/12/2021   Hepatomegaly 12/12/2021   Neuropathy 08/15/2021   Vitamin D  deficiency 08/15/2021   B12 deficiency 08/15/2021   Paroxysmal atrial fibrillation (HCC) 08/15/2021   Change in bowel habits    Polyp of transverse colon    Postmenopausal osteoporosis 02/25/2018   Fatty liver 09/29/2017   Coronary artery disease 09/29/2017   Basal cell carcinoma of nose 08/12/2017   Aortic atherosclerosis 09/14/2015   Personal history of tobacco use, presenting hazards to health 08/31/2015   Breast mass, right 07/12/2015   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   Gastroesophageal reflux disease without esophagitis 04/26/2015   Hyperlipidemia 04/26/2015   Carotid stenosis 08/25/2014   H/O malignant neoplasm of skin 08/13/2012    Past  Surgical History:  Procedure Laterality Date   BREAST BIOPSY Left 11/27/2022   stereo bx/ x clip/ path pending   BREAST BIOPSY Left 11/27/2022   MM LT BREAST BX W LOC DEV 1ST LESION IMAGE BX SPEC STEREO  GUIDE 11/27/2022 [facility]   COLONOSCOPY WITH PROPOFOL  N/A 08/22/2020   Procedure: COLONOSCOPY WITH PROPOFOL ;  Surgeon: Jinny Carmine, MD;  Location: [facility];  Service: Endoscopy;  Laterality: N/A;   ENDARTERECTOMY Left 08/25/2014   Procedure: ENDARTERECTOMY CAROTID;  Surgeon: Selinda GORMAN Gu, MD;  Location: [facility];  Service: Vascular;  Laterality: Left;   MOHS SURGERY     MOHS SURGERY  09/23/2017   nose   PERIPHERAL VASCULAR CATHETERIZATION N/A 07/20/2014   Procedure: Carotid Angiography;  Surgeon: Selinda GORMAN Gu, MD;  Location: [facility];  Service: Cardiovascular;  Laterality: N/A;   TONSILLECTOMY     TUBAL LIGATION      Family History  Problem Relation Age of Onset   Heart attack Mother    Hypercholesterolemia Mother    Hypertension Mother    Peripheral vascular disease Mother    Dementia Mother    Hypothyroidism Mother    CVA Father    Liver cancer Father    Kidney cancer Sister    Diabetes Brother    Diabetes Brother    Alzheimer's disease Brother    Other Brother        alzheimers   Cancer Son 39       cutaneous lymphoma   Lymphoma Son    HIV Son    Breast cancer Neg Hx     Social History   Tobacco Use   Smoking status: Former    Current packs/day: 0.00    Average packs/day: 1 pack/day for 55.0 years (55.0 ttl pk-yrs)    Types: Cigarettes    Start date: 11/26/1961    Quit date: 11/26/2016    Years since quitting: 7.0    Passive exposure: Past   Smokeless tobacco: Former    Types: Snuff   Tobacco comments:    smoking cessation materials not required  Substance Use Topics   Alcohol use: No     Current Outpatient Medications:    amLODipine  (NORVASC ) 5 MG tablet, Take 1 tablet (5 mg total) by mouth as needed (AS NEEDED for SBP (top blood pressure number) greater than 140)., Disp: 90 tablet, Rfl: 3   apixaban  (ELIQUIS ) 5 MG TABS tablet, Take 1 tablet (5 mg total) by mouth 2 (two) times daily., Disp: 180 tablet, Rfl: 3   aspirin  81 MG  tablet, Take 81 mg by mouth daily., Disp: , Rfl:    atorvastatin  (LIPITOR) 40 MG tablet, TAKE 1 TABLET BY MOUTH EVERY DAY, Disp: 90 tablet, Rfl: 2   denosumab  (PROLIA ) 60 MG/ML SOSY injection, Inject 60 mg into the skin every 6 (six) months., Disp: , Rfl:    ezetimibe  (ZETIA ) 10 MG tablet, TAKE 1 TABLET BY MOUTH EVERY DAY, Disp: 90 tablet, Rfl: 3   Fish Oil-Cholecalciferol (FISH OIL + D3 PO), Take 1,000 mg by mouth 1 day or 1 dose., Disp: , Rfl:    levalbuterol  (XOPENEX  HFA) 45 MCG/ACT inhaler, Inhale 2 puffs into the lungs every 6 (six) hours as needed for wheezing or shortness of breath., Disp: 1 each, Rfl: 2   metoprolol  succinate (TOPROL -XL) 25 MG 24 hr tablet, Take 1.5 tablets (37.5 mg total) by mouth daily., Disp: 90 tablet, Rfl: 3   metoprolol  tartrate (LOPRESSOR ) 25 MG tablet, TAKE 1 TABLET (  25 MG TOTAL) BY MOUTH 2 (TWO) TIMES DAILY AS NEEDED (AS NEEDED FOR PALPITATIONS)., Disp: 180 tablet, Rfl: 1   pregabalin  (LYRICA ) 150 MG capsule, Take 1 capsule (150 mg total) by mouth at bedtime., Disp: 90 capsule, Rfl: 1   [START ON 12/15/2023] Tiotropium Bromide Monohydrate  (SPIRIVA  RESPIMAT) 2.5 MCG/ACT AERS, Inhale 2 puffs into the lungs daily., Disp: 4 g, Rfl: 11 No current facility-administered medications for this visit.  Facility-Administered Medications Ordered in Other Visits:    albuterol  (PROVENTIL ) (2.5 MG/3ML) 0.083% nebulizer solution 2.5 mg, 2.5 mg, Nebulization, Once, Tamea Dedra CROME, MD  Allergies  Allergen Reactions   Morphine  Nausea Only and Nausea And Vomiting   Morphine  And Codeine Nausea And Vomiting   Zoster Vaccine Live Itching and Rash    I personally reviewed active problem list, medication list, allergies with the patient/caregiver today.   ROS  Ten systems reviewed and is negative except as mentioned in HPI    Objective Physical Exam CONSTITUTIONAL: Patient appears well-developed and well-nourished. No distress. HEENT: Head atraumatic, normocephalic, neck  supple. CARDIOVASCULAR: Normal rate, regular rhythm and normal heart sounds. No murmur heard. No BLE edema. PULMONARY: Effort normal and breath sounds normal. Lungs clear to auscultation bilaterally. No respiratory distress. ABDOMINAL: There is no tenderness or distention. MUSCULOSKELETAL: Normal gait. Without gross motor or sensory deficit. PSYCHIATRIC: Patient has a normal mood and affect. Behavior is normal. Judgment and thought content normal.  Vitals:   11/28/23 1056  BP: 128/74  Pulse: 62  Resp: 18  SpO2: 98%  Weight: 170 lb 3.2 oz (77.2 kg)  Height: 5' 11 (1.803 m)    Body mass index is 23.74 kg/m.  Recent Results (from the past 2160 hours)  Basic metabolic panel     Status: Abnormal   Collection Time: 10/08/23 12:58 AM  Result Value Ref Range   Sodium 142 135 - 145 mmol/L   Potassium 3.5 3.5 - 5.1 mmol/L   Chloride 108 98 - 111 mmol/L   CO2 23 22 - 32 mmol/L   Glucose, Bld 164 (H) 70 - 99 mg/dL    Comment: Glucose reference range applies only to samples taken after fasting for at least 8 hours.   BUN 17 8 - 23 mg/dL   Creatinine, Ser 9.14 0.44 - 1.00 mg/dL   Calcium  9.6 8.9 - 10.3 mg/dL   GFR, Estimated >39 >39 mL/min    Comment: (NOTE) Calculated using the CKD-EPI Creatinine Equation (2021)    Anion gap 11 5 - 15    Comment: Performed at [facility], [address] [place], [place] [address]  CBC     Status: None   Collection Time: 10/08/23 12:58 AM  Result Value Ref Range   WBC 9.7 4.0 - 10.5 K/uL   RBC 4.57 3.87 - 5.11 MIL/uL   Hemoglobin 13.6 12.0 - 15.0 g/dL   HCT 59.2 63.9 - 53.9 %   MCV 89.1 80.0 - 100.0 fL   MCH 29.8 26.0 - 34.0 pg   MCHC 33.4 30.0 - 36.0 g/dL   RDW 85.9 88.4 - 84.4 %   Platelets 208 150 - 400 K/uL   nRBC 0.0 0.0 - 0.2 %    Comment: Performed at [facility], [address] [place], [place] [address]  Troponin I (High Sensitivity)     Status: None   Collection Time: 10/08/23 12:58 AM  Result Value Ref  Range   Troponin I (High Sensitivity) 11 <18 ng/L    Comment: (NOTE)  Elevated high sensitivity troponin I (hsTnI) values and significant  changes across serial measurements may suggest ACS but many other  chronic and acute conditions are known to elevate hsTnI results.  Refer to the Links section for chest pain algorithms and additional  guidance. Performed at [facility], [address] [place], [place] [address]   Brain natriuretic peptide     Status: None   Collection Time: 10/08/23 12:58 AM  Result Value Ref Range   B Natriuretic Peptide 71.8 0.0 - 100.0 pg/mL    Comment: Performed at [facility], [address] [place], [place] [address]  Magnesium      Status: None   Collection Time: 10/08/23 12:58 AM  Result Value Ref Range   Magnesium  2.0 1.7 - 2.4 mg/dL    Comment: Performed at [facility], [address] [place], [place] [address]  TSH     Status: Abnormal   Collection Time: 10/08/23 12:58 AM  Result Value Ref Range   TSH 5.517 (H) 0.350 - 4.500 uIU/mL    Comment: Performed by a 3rd Generation assay with a functional sensitivity of <=0.01 uIU/mL. Performed at [facility], [address] [place], [place] [address]   T4, free     Status: None   Collection Time: 10/08/23 12:58 AM  Result Value Ref Range   Free T4 0.86 0.61 - 1.12 ng/dL    Comment: (NOTE) Biotin ingestion may interfere with free T4 tests. If the results are inconsistent with the TSH level, previous test results, or the clinical presentation, then consider biotin interference. If needed, order repeat testing after stopping biotin. Performed at [facility], [address] [place], [place] [address]   Urinalysis, Routine w reflex microscopic -Urine, Clean Catch     Status: Abnormal   Collection Time: 10/08/23  3:51 AM  Result Value Ref Range   Color, Urine STRAW (A) YELLOW   APPearance CLEAR (A) CLEAR   Specific Gravity, Urine 1.003 (L) 1.005  - 1.030   pH 6.0 5.0 - 8.0   Glucose, UA NEGATIVE NEGATIVE mg/dL   Hgb urine dipstick NEGATIVE NEGATIVE   Bilirubin Urine NEGATIVE NEGATIVE   Ketones, ur NEGATIVE NEGATIVE mg/dL   Protein, ur NEGATIVE NEGATIVE mg/dL   Nitrite NEGATIVE NEGATIVE   Leukocytes,Ua TRACE (A) NEGATIVE   RBC / HPF 0 0 - 5 RBC/hpf   WBC, UA 6-10 0 - 5 WBC/hpf   Bacteria, UA NONE SEEN NONE SEEN   Squamous Epithelial / HPF 0-5 0 - 5 /HPF    Comment: Performed at [facility], [address] [place], [place] [address]  Troponin I (High Sensitivity)     Status: Abnormal   Collection Time: 10/08/23  3:51 AM  Result Value Ref Range   Troponin I (High Sensitivity) 26 (H) <18 ng/L    Comment: (NOTE) Elevated high sensitivity troponin I (hsTnI) values and significant  changes across serial measurements may suggest ACS but many other  chronic and acute conditions are known to elevate hsTnI results.  Refer to the Links section for chest pain algorithms and additional  guidance. Performed at [facility], [address] [place], [place] [address]   Troponin I (High Sensitivity)     Status: Abnormal   Collection Time: 10/08/23  6:04 AM  Result Value Ref Range   Troponin I (High Sensitivity) 24 (H) <18 ng/L    Comment: (NOTE) Elevated high sensitivity troponin I (hsTnI) values and significant  changes across serial  measurements may suggest ACS but many other  chronic and acute conditions are known to elevate hsTnI results.  Refer to the Links section for chest pain algorithms and additional  guidance. Performed at [facility], [address] [place], [place] [address]   Phosphorus     Status: None   Collection Time: 10/08/23  6:04 AM  Result Value Ref Range   Phosphorus 3.0 2.5 - 4.6 mg/dL    Comment: Performed at [facility], [address] [place], [place] [address]  NM Myocar Multi W/Spect Marisela Motion / EF     Status: None   Collection Time: 11/11/23  1:53  PM  Result Value Ref Range   Rest HR 51.0 bpm   Rest BP 109/62 mmHg   Peak HR 80 bpm   Peak BP 152/58 mmHg   MPHR 142 bpm   Percent HR 56.0 %   ST Depression (mm) 0 mm   Rest Nuclear Isotope Dose 10.3 mCi   Stress Nuclear Isotope Dose 28.9 mCi   SSS 0.0    SRS 0.0    SDS 0.0    TID 1.03    LV sys vol 10.0 3.8 - 5.2 mL   LV dias vol 30.0 46 - 106 mL   Nuc Stress EF 67 %      PHQ2/9:    11/28/2023   10:52 AM 05/23/2023   10:31 AM 01/22/2023    9:59 AM 10/24/2022    9:13 AM 08/08/2022   10:46 AM  Depression screen PHQ 2/9  Decreased Interest 0 0 0 0 0  Down, Depressed, Hopeless 0 0 0 0 0  PHQ - 2 Score 0 0 0 0 0  Altered sleeping  0 0 0   Tired, decreased energy  0 0 0   Change in appetite  0 0 0   Feeling bad or failure about yourself   0 0 0   Trouble concentrating  0 0 0   Moving slowly or fidgety/restless  0 0 0   Suicidal thoughts  0 0 0   PHQ-9 Score  0 0 0   Difficult doing work/chores  Not difficult at all Not difficult at all      phq 9 is negative  Fall Risk:    11/28/2023   10:52 AM 05/23/2023   10:28 AM 01/22/2023    9:58 AM 10/24/2022    9:13 AM 08/08/2022   10:42 AM  Fall Risk   Falls in the past year? 0 0 0 0 0  Number falls in past yr: 0 0 0 0 0  Injury with Fall? 0 0 0 0 0  Risk for fall due to : No Fall Risks No Fall Risks No Fall Risks No Fall Risks No Fall Risks  Follow up Falls evaluation completed Falls prevention discussed;Education provided;Falls evaluation completed Falls prevention discussed;Education provided;Falls evaluation completed Falls prevention discussed Education provided;Falls prevention discussed     Assessment & Plan Coronary artery disease without angina and atherosclerosis of aorta Non-obstructive coronary artery disease without angina. No ischemia on stress test. Good left ventricular function. - Continue aspirin  81 mg daily. - Continue atorvastatin  40 mg daily. - Continue ezetimibe  10 mg daily. - Continue metoprolol   succinate 37.5 mg daily.  Paroxysmal atrial fibrillation Paroxysmal atrial fibrillation diagnosed in 2021. No recent episodes. Current regimen effective. - Continue metoprolol  succinate 37.5 mg daily. - Continue Eliquis  5 mg twice daily. - Use Lopressor  as needed for palpitations.  Chronic diastolic heart  failure, well compensated (grade 1) Chronic diastolic heart failure, well compensated, grade 1. Intermittent left bundle branch block noted. - Monitor symptoms and blood pressure. - Consider diuretic if symptoms worsen.  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 Improved with Spiriva  after potential  adverse reaction to Trelegy. - Continue Spiriva  as prescribed.  Chronic dyspnea Chronic dyspnea likely related to COPD and heart failure. Symptoms improve with reclining. - pulmonologist is ordering sleep study   Hypertension Hypertension managed with metoprolol  and amlodipine . Occasional nocturnal systolic elevation above 140 mmHg. - Continue metoprolol  succinate 37.5 mg daily. - Use amlodipine  as needed for elevated blood pressure.  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 B12 levels previously normal. - Continue B12 supplementation. - Monitor balance and neuropathy symptoms.  Gastroesophageal reflux disease GERD managed with dietary modifications. - Continue dietary modifications to manage symptoms.  Age-related osteoporosis without current pathological fracture Age-related osteoporosis managed with Prolia  injections. - Administer Prolia  injection as scheduled.  Breast mass (benign, not operated) Benign breast mass not operated. No suspicion of malignancy. - Order bilateral mammogram with ultrasound.  Fatty liver disease Fatty liver disease.  Deficiency of B group vitamins (on supplementation) Deficiency of B group vitamins managed with supplementation. B12  levels previously normal. - Continue B12 supplementation.

## 2023-11-29 LAB — LIPID PANEL
Cholesterol: 99 mg/dL (ref <200)
HDL: 30 mg/dL — ABNORMAL LOW (ref >=50)
LDL Cholesterol (Calc): 44 mg/dL
Non-HDL Cholesterol (Calc): 69 mg/dL (ref <130)
Total CHOL/HDL Ratio: 3.3 (calc) (ref <5.0)
Triglycerides: 167 mg/dL — ABNORMAL HIGH (ref <150)

## 2023-11-29 LAB — COMPREHENSIVE METABOLIC PANEL WITH GFR
AG Ratio: 1.5 (calc) (ref 1.0–2.5)
ALT: 24 U/L (ref 6–29)
AST: 23 U/L (ref 10–35)
Albumin: 4.1 g/dL (ref 3.6–5.1)
Alkaline phosphatase (APISO): 76 U/L (ref 37–153)
BUN: 14 mg/dL (ref 7–25)
CO2: 30 mmol/L (ref 20–32)
Calcium: 9.7 mg/dL (ref 8.6–10.4)
Chloride: 105 mmol/L (ref 98–110)
Creat: 0.79 mg/dL (ref 0.60–1.00)
Globulin: 2.7 g/dL (ref 1.9–3.7)
Glucose, Bld: 89 mg/dL (ref 65–99)
Potassium: 4.3 mmol/L (ref 3.5–5.3)
Sodium: 142 mmol/L (ref 135–146)
Total Bilirubin: 0.7 mg/dL (ref 0.2–1.2)
Total Protein: 6.8 g/dL (ref 6.1–8.1)
eGFR: 77 mL/min/1.73m2 (ref >=60)

## 2023-11-29 LAB — TSH+FREE T4: TSH W/REFLEX TO FT4: 3.86 m[IU]/L (ref 0.40–4.50)

## 2023-12-01 ENCOUNTER — Ambulatory Visit: Payer: Self-pay | Admitting: Family Medicine

## 2023-12-01 ENCOUNTER — Ambulatory Visit
Admission: RE | Admit: 2023-12-01 | Discharge: 2023-12-01 | Disposition: A | Discharge status: Home / Self Care | Source: Ambulatory Visit | Attending: Acute Care | Admitting: Acute Care

## 2023-12-01 DIAGNOSIS — Z87891 Personal history of nicotine dependence: Secondary | ICD-10-CM | POA: Diagnosis present

## 2023-12-01 DIAGNOSIS — R911 Solitary pulmonary nodule: Secondary | ICD-10-CM | POA: Insufficient documentation

## 2023-12-06 ENCOUNTER — Encounter

## 2023-12-06 DIAGNOSIS — Z9189 Other specified personal risk factors, not elsewhere classified: Secondary | ICD-10-CM

## 2023-12-09 ENCOUNTER — Telehealth: Payer: Self-pay

## 2023-12-09 ENCOUNTER — Other Ambulatory Visit: Payer: Self-pay

## 2023-12-09 DIAGNOSIS — Z1231 Encounter for screening mammogram for malignant neoplasm of breast: Secondary | ICD-10-CM

## 2023-12-09 DIAGNOSIS — Z9889 Other specified postprocedural states: Secondary | ICD-10-CM

## 2023-12-09 NOTE — Telephone Encounter (Signed)
 Copied from CRM #[id number]. Topic: Clinical - Request for Lab/Test Order >> Dec 09, 2023  1:45 PM Delon DASEN wrote: Reason for CRM: need 3D mammogram and US  ordered at [facility]- [phone]

## 2023-12-09 NOTE — Telephone Encounter (Signed)
 ordered

## 2023-12-10 ENCOUNTER — Other Ambulatory Visit: Payer: Self-pay | Admitting: Cardiovascular Disease

## 2023-12-10 ENCOUNTER — Other Ambulatory Visit: Payer: Self-pay | Admitting: Physician Assistant

## 2023-12-13 ENCOUNTER — Other Ambulatory Visit: Payer: Self-pay | Admitting: Family Medicine

## 2023-12-13 DIAGNOSIS — G629 Polyneuropathy, unspecified: Secondary | ICD-10-CM

## 2023-12-17 DIAGNOSIS — G4730 Sleep apnea, unspecified: Secondary | ICD-10-CM | POA: Diagnosis not present

## 2023-12-17 DIAGNOSIS — G4733 Obstructive sleep apnea (adult) (pediatric): Secondary | ICD-10-CM | POA: Diagnosis not present

## 2023-12-19 ENCOUNTER — Other Ambulatory Visit: Payer: Self-pay | Admitting: Family Medicine

## 2023-12-19 ENCOUNTER — Ambulatory Visit
Admission: RE | Admit: 2023-12-19 | Discharge: 2023-12-19 | Disposition: A | Discharge status: Home / Self Care | Source: Ambulatory Visit | Attending: Family Medicine | Admitting: Family Medicine

## 2023-12-19 ENCOUNTER — Ambulatory Visit: Payer: Self-pay | Admitting: Pulmonary Disease

## 2023-12-19 DIAGNOSIS — Z1231 Encounter for screening mammogram for malignant neoplasm of breast: Secondary | ICD-10-CM

## 2023-12-19 DIAGNOSIS — Z9889 Other specified postprocedural states: Secondary | ICD-10-CM

## 2023-12-19 DIAGNOSIS — N6310 Unspecified lump in the right breast, unspecified quadrant: Secondary | ICD-10-CM

## 2023-12-19 DIAGNOSIS — N6311 Unspecified lump in the right breast, upper outer quadrant: Secondary | ICD-10-CM | POA: Diagnosis not present

## 2023-12-19 DIAGNOSIS — G4733 Obstructive sleep apnea (adult) (pediatric): Secondary | ICD-10-CM

## 2023-12-19 NOTE — Telephone Encounter (Signed)
 LMTCB. E2C2 please advise when patient calls back.

## 2023-12-19 NOTE — Telephone Encounter (Signed)
-----   Message from Dedra Sanders sent at 12/19/2023  1:09 PM EDT ----- Sleep study confirms that she has moderate sleep apnea.  We can order CPAP equipment for her.  I would recommend an AutoSet at 5 to 15 cm H2O, EPR 3, heated humidity and mask of choice.  We can use  [facility] or DME of her choice. ----- Message ----- From: Vannie Donzell RAMAN Sent: 12/17/2023   5:41 PM EDT To: Dedra LITTIE Sanders, MD

## 2023-12-19 NOTE — Telephone Encounter (Signed)
 Spoke with pt and she has agreed to proceed with CPAP therapy. Order has been placed per Dr. Evalyn recommendations.

## 2023-12-19 NOTE — Telephone Encounter (Signed)
 Copied from CRM [id number]. Topic: General - Other >> Dec 19, 2023  2:01 PM Rilla B wrote: Reason for CRM: Patient returning call to [facility].  Please call patient @ [phone]

## 2023-12-27 ENCOUNTER — Other Ambulatory Visit: Payer: Self-pay | Admitting: Physician Assistant

## 2023-12-27 DIAGNOSIS — I480 Paroxysmal atrial fibrillation: Secondary | ICD-10-CM

## 2023-12-29 NOTE — Telephone Encounter (Signed)
 Eliquis  5mg  refill request received. Patient is 78 years old, weight-77.2kg, Crea-0.79 on 11/28/23, Diagnosis-Afib, and last seen by Bernardino Bring on 10/31/23. Dose is appropriate based on dosing criteria. Will send in refill to requested pharmacy.

## 2024-01-03 NOTE — Progress Notes (Unsigned)
 Cardiology Office Note    Date:  01/05/2024   ID:  Kristie Walters, DOB April 04, 1945, MRN [id number]  PCP:  Glenard Mire, MD  Cardiologist:  Evalene Lunger, MD  Electrophysiologist:  None   Chief Complaint: Follow up  History of Present Illness:   Kristie Walters is a 78 y.o. 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  She was previously followed by Dr. Florencio though subsequently transitioned to Dr. Gollan in 09/2017.  Prior echo from 09/2015, done by outside cardiology group, showed an EF greater than 55%, mild LVH, normal RV systolic function, mild MR/TR.  Nuclear stress test at that time showed an EF of 77% with no evidence of significant ischemia or scar.  She was referred to [facility] in 09/2017 for evaluation of incidentally noted aortic atherosclerosis and coronary artery calcium  along the LAD on noninvasive imaging.  She was noted to not be very active at baseline secondary to back pain.  She did note some rare palpitations associated with stress that improved with rest.  Aggressive primary prevention was recommended.  She declined stress testing at that time.   She was seen in the ED in 02/2019 with palpitations and noted to be in new onset A. fib with RVR with LBBB (EMS EKG).  She had spontaneously converted to sinus rhythm in the field following IV metoprolol .  She was noted to have a new left bundle which persisted in sinus rhythm.  High-sensitivity  troponin negative x2.  She was placed on metoprolol  and Eliquis .  Subsequent echo in 03/2019 showed an EF of 55 to 60%, no regional wall motion abnormalities, grade 2 diastolic dysfunction, normal RV systolic function and ventricular cavity size, normal size left atrium, and mildly elevated PASP.  Lexiscan  MPI on 01/11/2020 showed no evidence of significant ischemia or scar with an LVEF greater than 65%.  With administration of regadenoson  she developed a LBBB with known intermittent LBBB.  Attenuation corrected CT images noted aortic atherosclerosis with no significant coronary artery calcification.  Overall, this was a low risk study.  Zio patch showed a predominant rhythm of sinus with an average heart rate of 65 bpm.  First-degree AV block was noted.  18 episodes of SVT were noted with the longest interval lasting 10 beats with an average rate of 109 bpm and the fastest interval lasting 4 beats with a maximum rate of 158 bpm.  Isolated PACs, atrial couplets, atrial triplets, and PVCs were noted.  There were 6 patient triggered events and these were not associated with significant arrhythmia.  Echo in 02/2020 showed an EF of 55-60%, no RWMA, Gr1DD, normal RV systolic function and ventricular cavity size, and a PASP of 35.7 mmHg.   She was seen in the ED on 10/24/2021 with A-fib with RVR with rates ranging from 90 to 170 bpm with associated shortness of breath generalized weakness, fatigue, and chills.  High-sensitivity troponin of 13 with a delta troponin of 20.  She spontaneously converted to sinus rhythm in the ED.  Coronary CTA  on 11/15/2021 demonstrated a calcium  score of 170 which was the 67th percentile with 25 to 49% proximal LAD stenosis noted.   She was seen in the office on 10/23/2022 noting an increase in fatigue, shortness of breath, palpitations, and abdominal bloating.  Zio patch in 10/2022 showed a predominant rhythm of sinus with an average rate of 70 bpm (range 49 to 148 bpm), intermittent bundle  branch block, 18 episodes of SVT lasting up to 12.9 seconds, and rare atrial and ventricular ectopy.  Echo on 11/14/2022 showed an EF of 60 to 65%, no regional wall motion abnormalities, grade 1 diastolic dysfunction, normal RV systolic function and ventricular cavity size, mild mitral regurgitation, tricuspid aortic valve, and an estimated right atrial pressure of 3 mmHg.  In follow-up visits she has noted ongoing chronic fatigue and stable dyspnea.   She was admitted to the hospital in 09/2023 with chest discomfort, palpitations, and dyspnea consistent with prior known episodes of A-fib.  In the field, she was in A-fib with RVR with associated chest discomfort and dyspnea.  EKG showed rate controlled A-fib.  High-sensitivity troponin 11 with a delta and peak troponin of 26.  She had been adherent to OAC.  She declined DCCV in the ED and was given 10 mg of IV diltiazem .  She was subsequently discharged by the hospitalist service.  She followed up in the office in 10/2023 and was under increased stress surrounding the passing of her son.  She also noted a decline in baseline dyspnea following self discontinuation of Trelegy.  EKG demonstrated sinus rhythm.  Subsequent Lexiscan  MPI on 11/11/2023 showed no significant ischemia or scar with an EF greater than 65% and was overall low risk.  There was no significant change compared to study from 12/2019.  She comes in doing well from a cardiac perspective and is without symptoms of angina or cardiac decompensation.  No dizziness, presyncope, or syncope.  Chronic dyspnea is stable.  She does note some mild lower extremity swelling that is more progressive throughout the day and improves if she is wearing compression socks and is resolved first thing in the morning.  Palpitations are improved with new inhaler.  Has also recently been diagnosed with sleep apnea and is awaiting CPAP.  Continues to be more active with her cat.   Labs independently reviewed: 11/2023 -  TSH normal, BUN 14, serum creatinine 0.79, potassium 4.3, albumin 4.1, AST/ALT normal, TC 99, TG 167, HDL 30, LDL 44 09/2023 - magnesium  2.0, BNP 71, Hgb 13.6, PLT 208 12/2022 - A1c 5.9  Past Medical History:  Diagnosis Date   Allergy    Atrial fibrillation (HCC)    Basal cell carcinoma of nose 08/12/2017   Carotid artery disease    s/p L. 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    Past Surgical History:  Procedure Laterality Date   BREAST BIOPSY Left 11/27/2022   stereo bx, x clip, sclerosing  lesion with UDH   BREAST BIOPSY Left 11/27/2022   MM LT BREAST BX W LOC DEV 1ST LESION IMAGE BX SPEC STEREO GUIDE 11/27/2022 [facility]   COLONOSCOPY WITH PROPOFOL  N/A 08/22/2020   Procedure: COLONOSCOPY WITH PROPOFOL ;  Surgeon: Jinny Carmine, MD;  Location: [facility] ENDOSCOPY;  Service: Endoscopy;  Laterality: N/A;   ENDARTERECTOMY Left 08/25/2014   Procedure: ENDARTERECTOMY CAROTID;  Surgeon: Selinda GORMAN Gu, MD;  Location: [facility] ORS;  Service: Vascular;  Laterality: Left;   MOHS SURGERY     MOHS SURGERY  09/23/2017   nose   PERIPHERAL VASCULAR CATHETERIZATION N/A 07/20/2014   Procedure: Carotid Angiography;  Surgeon: Selinda GORMAN Gu, MD;  Location: [facility] INVASIVE CV LAB;  Service: Cardiovascular;  Laterality: N/A;   TONSILLECTOMY     TUBAL LIGATION      Current Medications: Current Meds  Medication Sig   amLODipine  (NORVASC ) 5 MG tablet Take 1 tablet (5 mg total) by mouth as  needed (AS NEEDED for SBP (top blood pressure number) greater than 140).   apixaban  (ELIQUIS ) 5 MG TABS tablet TAKE 1 TABLET BY MOUTH TWICE A DAY   aspirin  81 MG tablet Take 81 mg by mouth daily.   atorvastatin  (LIPITOR) 40 MG tablet TAKE 1 TABLET BY MOUTH EVERY DAY   denosumab  (PROLIA ) 60 MG/ML SOSY injection Inject 60 mg into the skin every 6 (six) months.   ezetimibe  (ZETIA ) 10 MG tablet TAKE 1 TABLET BY MOUTH EVERY DAY   Fish Oil-Cholecalciferol (FISH OIL + D3 PO) Take 1,000 mg by mouth 1 day or 1 dose.   levalbuterol  (XOPENEX  HFA) 45 MCG/ACT inhaler Inhale 2 puffs into the lungs every 6 (six) hours as needed for wheezing or shortness of breath.   metoprolol  succinate (TOPROL -XL) 25 MG 24 hr tablet Take 1.5 tablets (37.5 mg total) by mouth daily.   metoprolol  tartrate (LOPRESSOR ) 25 MG tablet TAKE 1 TABLET (25 MG TOTAL) BY MOUTH 2 (TWO) TIMES DAILY AS NEEDED (AS NEEDED FOR PALPITATIONS).   pregabalin  (LYRICA ) 150 MG capsule TAKE 1 CAPSULE BY MOUTH AT BEDTIME.   Tiotropium Bromide Monohydrate  (SPIRIVA  RESPIMAT) 2.5 MCG/ACT AERS Inhale 2 puffs into the lungs daily.    Allergies:   Morphine , Morphine  and codeine, and Zoster vaccine live   Social History   Socioeconomic History   Marital status: Divorced    Spouse name: Not on file   Number of children: 2   Years of education: Not on file   Highest education level: 10th grade  Occupational History   Occupation: Retired  Tobacco Use   Smoking status: Former    Current packs/day: 0.00    Average packs/day: 1 pack/day for 55.0 years (55.0 ttl pk-yrs)    Types: Cigarettes    Start date: 11/26/1961    Quit date: 11/26/2016    Years since quitting: 7.1    Passive exposure: Past   Smokeless tobacco: Former    Types: Snuff   Tobacco comments:    smoking cessation materials not required  Vaping Use   Vaping status: Never Used  Substance and Sexual Activity   Alcohol use: No   Drug use: No   Sexual activity: Not Currently  Other  Topics Concern   Not on file  Social History Narrative    Pt lives alone   Social Drivers of Health   Financial Resource Strain: Low Risk  (12/23/2022)   Received from [facility]   Overall Financial Resource Strain (CARDIA)    Difficulty of Paying Living  Expenses: Not hard at all  Food Insecurity: No Food Insecurity (12/23/2022)   Received from [facility]   Hunger Vital Sign    Within the past 12 months, you worried that your food would run out before you got the money to buy more.: Never true    Within the past 12 months, the food you bought just didn't last and you didn't have money to get more.: Never true  Transportation Needs: No Transportation Needs (12/23/2022)   Received from [facility] - Transportation    In the past 12 months, has lack of transportation kept you from medical appointments or from getting medications?: No    Lack of Transportation (Non-Medical): No  Physical Activity: Inactive (08/08/2022)   Exercise Vital Sign    Days of Exercise per Week: 0 days    Minutes of Exercise per Session: 0 min  Stress: Stress Concern Present (08/08/2022)   [employer] - Occupational Stress Questionnaire    Feeling of Stress : To some extent  Social Connections: Moderately Isolated (08/08/2022)   Social Connection and Isolation Panel    Frequency of Communication with Friends and Family: More than three times a week    Frequency of Social Gatherings with Friends and Family: Three times a week    Attends Religious Services: More than 4 times per year    Active Member of Clubs or Organizations: No    Attends Banker Meetings: Never    Marital Status: Divorced     Family History:  The patient's family history includes Alzheimer's disease in her brother; CVA in her father; Cancer (age of onset: 33) in her son; Dementia in her mother; Diabetes in her brother and brother; HIV in  her son; Heart attack in her mother; Hypercholesterolemia in her mother; Hypertension in her mother; Hypothyroidism in her mother; Kidney cancer in her sister; Liver cancer in her father; Lymphoma in her son; Other in her brother; Peripheral vascular disease in her mother. There is no history of Breast cancer.  ROS:   12-point review of systems is negative unless otherwise noted in the HPI.   EKGs/Labs/Other Studies Reviewed:    Studies reviewed were summarized above. The additional studies were reviewed today:  Lexiscan  MPI 11/11/2023:   Normal pharmacologic myocardial perfusion stress test without evidence of significant ischemia or scar.   Normal left ventricular systolic function (LVEF > 65%).   Coronary artery calcification is noted on the attenuation correction CT.   No significant change noted compared with prior study on 01/11/2020.   This is a low risk study. __________  Carotid artery ultrasound 08/01/2023: Summary:  Right Carotid: Velocities in the right ICA are consistent with a 1-39% stenosis.   Left Carotid: Velocities in the left ICA are consistent with a 1-39% stenosis.   Vertebrals: Bilateral vertebral arteries demonstrate antegrade flow.  Subclavians: Normal flow hemodynamics were seen in bilateral subclavian arteries.  __________  Zio patch 10/2022: Normal sinus rhythm Patient had a min HR of 49 bpm, max HR of 148 bpm, and avg HR of 70 bpm. Intermittent Bundle Branch Block was present.  18 Supraventricular Tachycardia runs occurred, the run with the fastest interval lasting 12.9 secs with a max rate of 148 bpm, the longest lasting 26 beats with an avg rate of 126 bpm.    Isolated SVEs were rare (<1.0%), SVE Couplets were rare (<1.0%), and SVE Triplets were rare (<1.0%).  Isolated VEs were rare (<  1.0%), and no VE Couplets or VE Triplets were present.   Patient triggered events recorded __________   2D echo 11/14/2022: 1. Left ventricular ejection fraction, by  estimation, is 60 to 65%. The  left ventricle has normal function. The left ventricle has no regional  wall motion abnormalities. Left ventricular diastolic parameters are  consistent with Grade I diastolic  dysfunction (impaired relaxation). The average left ventricular global  longitudinal strain is -19.6 %.   2. Right ventricular systolic function is normal. The right ventricular  size is normal. Tricuspid regurgitation signal is inadequate for assessing  PA pressure.   3. The mitral valve is normal in structure. Mild mitral valve  regurgitation. No evidence of mitral stenosis.   4. The aortic valve is tricuspid. Aortic valve regurgitation is not  visualized. No aortic stenosis is present.   5. The inferior vena cava is normal in size with greater than 50%  respiratory variability, suggesting right atrial pressure of 3 mmHg.  __________   Carotid artery ultrasound 07/31/2022: Summary:  Right Carotid: Velocities in the right ICA are consistent with a 1-39%  stenosis.  Non-hemodynamically significant plaque <50% noted in the CCA.   Left Carotid: Velocities in the left ICA are consistent with a 1-39% stenosis.   Vertebrals: Bilateral vertebral arteries demonstrate antegrade flow.  Subclavians: Normal flow hemodynamics were seen in bilateral subclavian arteries.  __________   Coronary CTA 11/15/2021: FINDINGS: Aorta: Normal size. Aortic root and descending aorta calcifications. No dissection.   Aortic Valve:  Trileaflet.  No calcifications.   Coronary Arteries:  Normal coronary origin.  Right dominance.   RCA is a dominant artery that gives rise to PDA and PLA. There is no plaque.   Left main gives rise to LAD and LCX arteries. There is no LM disease.   LAD has calcified plaque in the proximal segment causing mild stenosis (25-49%).   LCX is a non-dominant artery that gives rise to three obtuse marginal branches. There is no plaque.   Other findings:   Normal pulmonary  vein drainage into the left atrium.   Normal left atrial appendage without a thrombus.   Normal size of the pulmonary artery.   IMPRESSION: 1. Coronary calcium  score of 170. This was 67th percentile for age and sex matched control. 2. Normal coronary origin with right dominance. 3. Calcified plaque in the proximal LAD causing mild stenosis (25-49%) 4. CAD-RADS 2. Mild non-obstructive CAD (25-49%). Consider non-atherosclerotic causes of chest pain. Consider preventive therapy and risk factor modification. __________   2D echo 09/04/2021: 1. Left ventricular ejection fraction, by estimation, is 55 to 60%. The  left ventricle has normal function. The left ventricle has no regional  wall motion abnormalities. Left ventricular diastolic parameters are  consistent with Grade I diastolic  dysfunction (impaired relaxation).   2. Right ventricular systolic function is normal. The right ventricular  size is normal. Tricuspid regurgitation signal is inadequate for assessing  PA pressure.   3. The mitral valve is normal in structure. No evidence of mitral valve  regurgitation. No evidence of mitral stenosis.   4. The aortic valve was not well visualized. Aortic valve regurgitation  is not visualized. No aortic stenosis is present.   5. The inferior vena cava is normal in size with greater than 50%  respiratory variability, suggesting right atrial pressure of 3 mmHg.   Comparison(s): 02/2020-EF 55-60%. __________   Carotid artery ultrasound 07/20/2021: Summary:  Right Carotid: Velocities in the right ICA are consistent with a  1-39% stenosis.   Left Carotid: Velocities in the left ICA are consistent with a 1-39%  stenosis.   Vertebrals:  Bilateral vertebral arteries demonstrate antegrade flow.  Subclavians: Normal flow hemodynamics were seen in bilateral subclavian arteries. ___________   2D echo 03/09/2020: 1. Left ventricular ejection fraction, by estimation, is 55 to 60%. The  left  ventricle has normal function. The left ventricle has no regional  wall motion abnormalities. Left ventricular diastolic parameters are  consistent with Grade I diastolic  dysfunction (impaired relaxation).   2. Right ventricular systolic function is normal. The right ventricular  size is normal. There is normal pulmonary artery systolic pressure. The  estimated right ventricular systolic pressure is 35.7 mmHg.   3. The aortic valve was not well visualized. __________   Lexiscan  MPI 01/11/2020: Normal pharmacologic myocardial perfusion stress test without evidence of significant ischemia or scar. The left ventricular ejection fraction is hyperdynamic (>65%). Left bundle branch block developed after administration of regadenoson . Patient known to have intermittent LBBB. Attenuation correction CT is notable for aortic atherosclerosis. There is no significant coronary artery calcification. This is a low risk study. __________   Zio patch 12/2019: Normal sinus rhythm avg HR of 65 bpm.    Patient triggered events (6) were not associated with significant arrhythmia.   First Degree AV Block was present.  18 Supraventricular Tachycardia runs occurred, the run with the fastest interval lasting 4 beats with a max rate of 158 bpm, the longest lasting 10 beats with an avg rate of 109 bpm.   Isolated SVEs were rare (<1.0%), SVE Couplets were rare (<1.0%), and SVE Triplets were rare (<1.0%). Isolated VEs were rare (<1.0%), and no VE Couplets or VE Triplets were present. __________   2D echo 03/2019: 1. Left ventricular ejection fraction, by visual estimation, is 55 to  60%. The left ventricle has normal function. There is no left ventricular  hypertrophy.   2. Left ventricular diastolic parameters are consistent with Grade II  diastolic dysfunction (pseudonormalization).   3. The left ventricle has no regional wall motion abnormalities.   4. Global right ventricle has normal systolic  function.The right  ventricular size is normal. No increase in right ventricular wall  thickness.   5. Left atrial size was normal.   6. Mildly elevated pulmonary artery systolic pressure.   EKG:  EKG is ordered today.  The EKG ordered today demonstrates NSR, 62 bpm, no acute ST-T changes  Recent Labs: 10/08/2023: B Natriuretic Peptide 71.8; Hemoglobin 13.6; Magnesium  2.0; Platelets 208; TSH 5.517 11/28/2023: ALT 24; BUN 14; Creat 0.79; Potassium 4.3; Sodium 142  Recent Lipid Panel    Component Value Date/Time   CHOL 99 11/28/2023 1137   CHOL 120 10/23/2022 1233   TRIG 167 (H) 11/28/2023 1137   HDL 30 (L) 11/28/2023 1137   HDL 34 (L) 10/23/2022 1233   CHOLHDL 3.3 11/28/2023 1137   LDLCALC 44 11/28/2023 1137    PHYSICAL EXAM:    VS:  BP 120/60 (BP Location: Left Arm, Patient Position: Sitting, Cuff Size: Normal)   Pulse 62 Comment: 70 OXIMETER  Ht 5' 11 (1.803 m)   Wt 172 lb 9.6 oz (78.3 kg)   SpO2 97%   BMI 24.07 kg/m   BMI: Body mass index is 24.07 kg/m.  Physical Exam Vitals reviewed.  Constitutional:      Appearance: She is well-developed.  HENT:     Head: Normocephalic and atraumatic.  Eyes:     General:  Right eye: No discharge.        Left eye: No discharge.  Cardiovascular:     Rate and Rhythm: Normal rate and regular rhythm.     Heart sounds: Normal heart sounds, S1 normal and S2 normal. Heart sounds not distant. No midsystolic click and no opening snap. No murmur heard.    No friction rub.  Pulmonary:     Effort: Pulmonary effort is normal. No respiratory distress.     Breath sounds: Normal breath sounds. No decreased breath sounds, wheezing, rhonchi or rales.  Musculoskeletal:     Cervical back: Normal range of motion.     Right lower leg: No edema.     Left lower leg: No edema.  Skin:    General: Skin is warm and dry.     Nails: There is no clubbing.  Neurological:     Mental Status: She is alert and oriented to person, place, and time.   Psychiatric:        Speech: Speech normal.        Behavior: Behavior normal.        Thought Content: Thought content normal.        Judgment: Judgment normal.     Wt Readings from Last 3 Encounters:  01/05/24 172 lb 9.6 oz (78.3 kg)  11/28/23 170 lb 3.2 oz (77.2 kg)  11/20/23 171 lb 3.2 oz (77.7 kg)     ASSESSMENT & PLAN:   Nonobstructive CAD involving the native coronary arteries without angina with chronic dyspnea: She is doing well and without symptoms concerning for angina or cardiac decompensation.  Prior coronary CTA in 2023 showed nonobstructive disease involving the LAD.  Recent Lexiscan  MPI showed no evidence of ischemia with preserved LV systolic function and was overall low risk.  Continue aggressive risk factor modification with aspirin  81 mg, atorvastatin  40 mg, ezetimibe  10 mg, and Toprol -XL 37.5 mg twice daily.  No indication for further ischemic testing at this time.  PAF/PSVT: Maintaining sinus rhythm on Toprol -XL 37.5 mg daily.  Palpitation burden improved following transition to Spiriva .  Query if some of her palpitations were also related to stress following the passing of her son who was chronically ill.  Has Lopressor  25 mg twice daily to take as needed for sustained palpitations.  CHA2DS2-VASc at least 5.  Remains on apixaban  5 mg twice daily and does not meet reduced dosing criteria.  No falls or symptoms concerning for bleeding.  Recent labs stable.  Diastolic dysfunction: Euvolemic and well compensated, not requiring standing loop diuretic.  Continue optimal blood pressure control.  Intermittent lower extremity swelling appears to be consistent with venous insufficiency with recommendation for compression socks and leg elevation.  She has not taken any as needed amlodipine  for elevated BP.  Intermittent LBBB: Asymptomatic.  Echo with preserved LV systolic function.  HTN: Blood pressure is well-controlled in the office today on Toprol -XL 37.5 mg daily.  Has  amlodipine  5 mg to take as needed for systolic blood pressure greater than 140, though has not needed any of this.  HLD: LDL 44 in 11/2023, with normal AST/ALT at that time.  Remains on atorvastatin  40 mg and ezetimibe  10 mg.  Carotid artery disease: Ultrasound from 07/2023 showed 1 to 39% bilateral ICA stenosis with anterograde flow of the bilateral vertebral arteries and normal flow hemodynamics in the bilateral subclavian arteries.  Remains on aspirin  and statin therapy as outlined above.  Followed by vascular surgery.  OSA: Recently diagnosed.  Awaiting CPAP.  Follow-up with sleep medicine as directed.     Disposition: F/u with Dr. Gollan or an APP in 6 months.   Medication Adjustments/Labs and Tests Ordered: Current medicines are reviewed at length with the patient today.  Concerns regarding medicines are outlined above. Medication changes, Labs and Tests ordered today are summarized above and listed in the Patient Instructions accessible in Encounters.   Signed, Bernardino Bring, PA-C 01/05/2024 1:05 PM      [facility] [address] [place], [place] [address] [phone]

## 2024-01-05 ENCOUNTER — Ambulatory Visit: Attending: Physician Assistant | Admitting: Physician Assistant

## 2024-01-05 ENCOUNTER — Encounter: Payer: Self-pay | Admitting: Physician Assistant

## 2024-01-05 VITALS — BP 120/60 | HR 62 | Ht 71.0 in | Wt 172.6 lb

## 2024-01-05 DIAGNOSIS — I6523 Occlusion and stenosis of bilateral carotid arteries: Secondary | ICD-10-CM

## 2024-01-05 DIAGNOSIS — I447 Left bundle-branch block, unspecified: Secondary | ICD-10-CM

## 2024-01-05 DIAGNOSIS — I5189 Other ill-defined heart diseases: Secondary | ICD-10-CM

## 2024-01-05 DIAGNOSIS — I471 Supraventricular tachycardia, unspecified: Secondary | ICD-10-CM | POA: Diagnosis not present

## 2024-01-05 DIAGNOSIS — I10 Essential (primary) hypertension: Secondary | ICD-10-CM

## 2024-01-05 DIAGNOSIS — I2510 Atherosclerotic heart disease of native coronary artery without angina pectoris: Secondary | ICD-10-CM

## 2024-01-05 DIAGNOSIS — I480 Paroxysmal atrial fibrillation: Secondary | ICD-10-CM

## 2024-01-05 DIAGNOSIS — E785 Hyperlipidemia, unspecified: Secondary | ICD-10-CM

## 2024-01-05 NOTE — Patient Instructions (Signed)
 Medication Instructions:  Your physician recommends that you continue on your current medications as directed. Please refer to the Current Medication list given to you today.   *If you need a refill on your cardiac medications before your next appointment, please call your pharmacy*  Lab Work: None ordered at this time   Testing/Procedures: Your physician has requested that you have an echocardiogram. Echocardiography is a painless test that uses sound waves to create images of your heart. It provides your doctor with information about the size and shape of your heart and how well your heart's chambers and valves are working.   You may receive an ultrasound enhancing agent through an IV if needed to better visualize your heart during the echo. This procedure takes approximately one hour.  There are no restrictions for this procedure.  This will take place at [address] [facility]) [address], [place] [address]  Please note: We ask at that you not bring children with you during ultrasound (echo/ vascular) testing. Due to room size and safety concerns, children are not allowed in the ultrasound rooms during exams. Our front office staff cannot provide observation of children in our lobby area while testing is being conducted. An adult accompanying a patient to their appointment will only be allowed in the ultrasound room at the discretion of the ultrasound technician under special circumstances. We apologize for any inconvenience.  Follow-Up: At [facility], you and your health needs are our priority.  As part of our continuing mission to provide you with exceptional heart care, our providers are all part of one team.  This team includes your primary Cardiologist (physician) and Advanced Practice Providers or APPs (Physician Assistants and Nurse Practitioners) who all work together to provide you with the care you need, when you need it.  Your next appointment:   6  month(s)  Provider:   You may see Timothy Gollan, MD or Bernardino Bring, PA-C  We recommend signing up for the patient portal called MyChart.  Sign up information is provided on this After Visit Summary.  MyChart is used to connect with patients for Virtual Visits (Telemedicine).  Patients are able to view lab/test results, encounter notes, upcoming appointments, etc.  Non-urgent messages can be sent to your provider as well.   To learn more about what you can do with MyChart, go to [email or web address].

## 2024-01-15 ENCOUNTER — Other Ambulatory Visit: Payer: Self-pay | Admitting: Physician Assistant

## 2024-01-15 DIAGNOSIS — I2510 Atherosclerotic heart disease of native coronary artery without angina pectoris: Secondary | ICD-10-CM

## 2024-01-15 DIAGNOSIS — I10 Essential (primary) hypertension: Secondary | ICD-10-CM

## 2024-01-19 ENCOUNTER — Other Ambulatory Visit: Payer: Self-pay | Admitting: Physician Assistant

## 2024-01-19 DIAGNOSIS — I10 Essential (primary) hypertension: Secondary | ICD-10-CM

## 2024-01-19 DIAGNOSIS — I2510 Atherosclerotic heart disease of native coronary artery without angina pectoris: Secondary | ICD-10-CM

## 2024-01-29 ENCOUNTER — Ambulatory Visit: Attending: Physician Assistant

## 2024-01-29 ENCOUNTER — Ambulatory Visit: Payer: Self-pay | Admitting: Physician Assistant

## 2024-01-29 DIAGNOSIS — I5189 Other ill-defined heart diseases: Secondary | ICD-10-CM

## 2024-01-29 DIAGNOSIS — I480 Paroxysmal atrial fibrillation: Secondary | ICD-10-CM

## 2024-01-29 DIAGNOSIS — I447 Left bundle-branch block, unspecified: Secondary | ICD-10-CM | POA: Diagnosis not present

## 2024-01-29 DIAGNOSIS — I2510 Atherosclerotic heart disease of native coronary artery without angina pectoris: Secondary | ICD-10-CM

## 2024-01-29 LAB — ECHOCARDIOGRAM COMPLETE
AR max vel: 2.79 cm2
AV Area VTI: 2.66 cm2
AV Area mean vel: 2.73 cm2
AV Mean grad: 3.3 mmHg
AV Peak grad: 6.4 mmHg
Ao pk vel: 1.26 m/s
Area-P 1/2: 3.77 cm2
Calc EF: 58.7 %
Single Plane A2C EF: 61.7 %
Single Plane A4C EF: 58.3 %

## 2024-01-30 ENCOUNTER — Ambulatory Visit: Admitting: Pulmonary Disease

## 2024-01-30 ENCOUNTER — Encounter: Payer: Self-pay | Admitting: Pulmonary Disease

## 2024-01-30 VITALS — BP 126/70 | HR 73 | Temp 98.0°F | Ht 71.0 in | Wt 172.6 lb

## 2024-01-30 DIAGNOSIS — J439 Emphysema, unspecified: Secondary | ICD-10-CM

## 2024-01-30 DIAGNOSIS — I480 Paroxysmal atrial fibrillation: Secondary | ICD-10-CM | POA: Diagnosis not present

## 2024-01-30 DIAGNOSIS — Z87891 Personal history of nicotine dependence: Secondary | ICD-10-CM | POA: Diagnosis not present

## 2024-01-30 DIAGNOSIS — G4733 Obstructive sleep apnea (adult) (pediatric): Secondary | ICD-10-CM

## 2024-01-30 DIAGNOSIS — J449 Chronic obstructive pulmonary disease, unspecified: Secondary | ICD-10-CM | POA: Diagnosis not present

## 2024-01-30 DIAGNOSIS — R918 Other nonspecific abnormal finding of lung field: Secondary | ICD-10-CM | POA: Diagnosis not present

## 2024-01-30 NOTE — Patient Instructions (Signed)
 VISIT SUMMARY:  Today, we discussed your ongoing issues with your CPAP device and reviewed your current treatment for chronic obstructive pulmonary disease (COPD). We also talked about your current medication, Spiriva , which you find effective for managing your COPD without causing an increase in heart rate.  We discussed issues with obtaining your CPAP.  Were checking into this.  YOUR PLAN:  -CHRONIC OBSTRUCTIVE PULMONARY DISEASE (COPD): COPD is a chronic lung condition that makes it hard to breathe. You are currently managing it well with Spiriva , which you find effective and does not cause an increase in heart rate. You should continue using Spiriva  as prescribed.  -OBSTRUCTIVE SLEEP APNEA: Obstructive sleep apnea is a condition where your breathing stops and starts during sleep. You are using a CPAP device to help manage this condition, which also benefits your atrial fibrillation. We discussed that your CPAP therapy is expected to improve your symptoms.   INSTRUCTIONS:  Please continue using Spiriva  for your COPD and your CPAP device, when available, for sleep apnea as instructed. If you experience any new symptoms or issues, please contact our office.

## 2024-01-30 NOTE — Progress Notes (Unsigned)
 Subjective:    Patient ID: Kristie Walters, female    DOB: 11-Oct-1945, 78 y.o.   MRN: [id number]  Patient Care Team: Sowles, Krichna, MD as PCP - General (Family Medicine) Perla Evalene PARAS, MD as PCP - Cardiology (Cardiology) Marea Selinda RAMAN, MD as Consulting Physician (Vascular Surgery) Alpha Chloe SAUNDERS, MD as Referring Physician (Dermatology) Perla Evalene PARAS, MD as Consulting Physician (Cardiology)  Chief Complaint  Patient presents with   COPD    Wheezing at night only.     BACKGROUND/INTERVAL:78 year old former smoker (quit 2018) with a 55-pack-year history of smoking presents for follow-up of shortness of breath and emphysema.  She has issues with COPD and obstructive sleep apnea.  She was last seen on 20 November 2023, does not endorse any hospitalizations since then.   HPI Discussed the use of AI scribe software for clinical note transcription with the patient, who gave verbal consent to proceed.  History of Present Illness   Kristie Walters is a 78 year old female with COPD and moderate sleep apnea who presents with issues related to obtaining her CPAP device.  She has been experiencing issues with obtaining her CPAP device due to issues with insurance and need to go through [place].  She has not yet obtained the prescribed device.  DME company is resubmitting to [employer].  She is currently using Spiriva  for her chronic obstructive pulmonary disease (COPD) and reports it is effective without causing an increase in heart rate, unlike her previous medication, Trelegy. She appreciates that Spiriva  does not have a bad taste and ensures to rinse her mouth after use. She experiences wheezing at night when supine, this never occurs during the day. Adjusting her sleeping position helps alleviate the wheezing.  This make it suspicious for upper airway resistance syndrome.  We discussed COPD and goal of therapy to prevent rapid decline in function.  She has done the most important step which  was to quit smoking.  She has had no relapses.  She maintains her regular schedule for lung cancer screening.  She has issues with paroxysmal atrial fibs but has not had any relapses on Spiriva .  She is tolerating the medication well.     DATA: 03/26/2019 2D echo: G2 DD, mild elevation of pulmonary artery systolic pressure, LVEF 55 to 39% 09/16/2019 LDCT chest: Multiple small inflammatory groundglass nodules, mild diffuse bronchial wall thickening with mild centrilobular and paraseptal emphysema, no evidence of fibrosis 03/28/2020 PFTs: FEV1 1.68 L or 59% predicted, FVC 2.06 L or 54% predicted, FEV1/FVC 82%, no bronchodilator response.  Lung volumes moderately reduced with ERV at 14% consistent with obesity.  Diffusion capacity moderately reduced and corrects for alveolar volume. 02/14/2021 LDCT chest: Lung RADS 2S, small solid and subsolid pulmonary nodules scattered throughout the lungs similar in size, number and distribution to prior study.  Largest is on the left lower lobe subsolid 10.9 mm in size with no change.  Extensive bilateral apical nodular pleural-parenchymal thickening and architectural distortion similar to prior. 05/15/2023 PFTs: FEV1 1.52 L or 55% predicted, FVC 1.86 L or 51% predicted, no bronchodilator response, lung volumes mildly decreased diffusion capacity moderately reduced consistent with combined obstructive/restrictive defect.  With no overt significant change from prior. 12/01/2023 LDCT chest: Previous 6.7 mm left upper lobe nodule has diminished in size and is nearly completely resolved, remaining solid and subsolid nodules are unchanged in the interim.  No new or enlarging nodules.  Biapical pleural-parenchymal scarring is seen.  Stable.  Emphysema with diffuse bronchial  thickening.  Multifocal bronchiectasis.  Lung RADS 2.  Review of Systems A 10 point review of systems was performed and it is as noted above otherwise negative.   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   Fatty liver 09/29/2017   Coronary artery disease 09/29/2017   Basal cell carcinoma of nose 08/12/2017   Aortic atherosclerosis 09/14/2015   Personal history of tobacco use, presenting hazards to health 08/31/2015   Breast mass, right 07/12/2015   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   Gastroesophageal reflux disease without esophagitis 04/26/2015   Hyperlipidemia 04/26/2015   Carotid stenosis 08/25/2014   H/O malignant neoplasm of skin 08/13/2012    Social History   Tobacco Use   Smoking status: Former    Current packs/day: 0.00    Average packs/day: 1 pack/day for 55.0 years (55.0 ttl pk-yrs)    Types: Cigarettes    Start date: 11/26/1961    Quit date: 11/26/2016    Years since quitting: 7.1    Passive exposure: Past   Smokeless tobacco: Former    Types: Snuff   Tobacco comments:    smoking cessation materials not required  Substance Use Topics   Alcohol use: No    Allergies[1]  Active Medications[2]  Immunization History  Administered Date(s) Administered    sv, Bivalent, Protein Subunit Rsvpref,pf (Abrysvo) 12/26/2022   Fluad Quad(high Dose 65+) 11/03/2021, 10/24/2022   INFLUENZA, HIGH DOSE SEASONAL PF 10/08/2016, 10/01/2018, 10/08/2019, 11/01/2020, 10/02/2023   Influenza-Unspecified 11/19/2014, 11/16/2015, 10/08/2016, 09/18/2017   Moderna Covid-19 Vaccine Bivalent Booster 69yrs & up 11/15/2020, 12/05/2021   Moderna Sars-Covid-2 Vaccination 01/05/2020, 07/27/2020   PFIZER(Purple Top)SARS-COV-2 Vaccination 04/21/2019, 05/12/2019   Pneumococcal  Conjugate-13 11/18/2016   Pneumococcal Polysaccharide-23 06/11/2012, 06/01/2020   Td 02/18/2009   Td (Adult), 2 Lf Tetanus Toxid, Preservative Free 02/18/2009   Tdap 04/25/2021   Zoster Recombinant(Shingrix ) 04/25/2021        Objective:     Vitals:   01/30/24 1012  BP: 126/70  Pulse: 73  Temp: 98 F (36.7 C)  Height: 5' 11 (1.803 m)  Weight: 172 lb 9.6 oz (78.3 kg)  SpO2: 96%  TempSrc: Temporal  BMI (Calculated): 24.08     SpO2: 96 %  GENERAL: Well-developed well-nourished woman in no acute distress.  She ambulates with assistance of a cane. HEAD: Normocephalic, atraumatic.  EYES: Pupils equal, round, reactive to light.  No scleral icterus.  MOUTH: Upper dentures, oral mucosa moist.  No thrush. NECK: Supple. No thyromegaly. Trachea midline. No JVD.  No adenopathy. PULMONARY: Good air entry bilaterally.  Coarse breath sounds otherwise no adventitious sounds. CARDIOVASCULAR: S1 and S2. Regular rate and rhythm.  No rubs, murmurs or gallops heard. ABDOMEN: Benign. MUSCULOSKELETAL: No joint deformity, no clubbing, no edema.  NEUROLOGIC: No focal deficit noted, speech is fluent.  Gait steady with assistance (cane). SKIN: Intact,warm,dry. PSYCH: Normal mood, normal behavior.   Assessment & Plan:     ICD-10-CM   1. COPD with chronic bronchitis and emphysema (HCC)  J44.89    J43.9     2. OSA (obstructive sleep apnea)  G47.33  3. Multiple subsolid lung nodules less than 6 mm in diameter  R91.8     4. Paroxysmal A-fib (HCC)  I48.0      Discussion:    Chronic obstructive pulmonary disease COPD, well-managed with Spiriva . No daytime wheezing, but nocturnal wheezing possibly related to sleep apnea.  Goal of therapy is to prevent rapid decline in lung function. - Continue Spiriva  for COPD management - Does not tolerate LABA's due to triggering paroxysmal A-fib  Obstructive sleep apnea Moderate obstructive sleep apnea with CPAP therapy. CPAP is beneficial for  managing atrial fibrillation. Nocturnal wheezing may be related to sleep apnea and neck structures (upper airway resistance syndrome). CPAP therapy is expected to improve symptoms. - Checked into issues with CPAP   Paroxysmal A-fib No recent episodes. -Continue follow-up with cardiology  Lung nodules She is enrolled in lung cancer screening program.  Lung nodules are reduced in size or stable.  Most recent lung cancer screening CT classified as lung RADS 2. - Yearly lung cancer screening CT   Will see the patient in follow-up in 3 months time.   Advised if symptoms do not improve or worsen, to please contact office for sooner follow up or seek emergency care.    I spent 30 minutes of dedicated to the care of this patient on the date of this encounter to include pre-visit review of records, face-to-face time with the patient discussing conditions above, post visit ordering of testing, clinical documentation with the electronic health record, making appropriate referrals as documented, and communicating necessary findings to members of the patients care team.     C. Leita Sanders, MD Advanced Bronchoscopy PCCM [facility]    *This note was generated using voice recognition software/Dragon and/or AI transcription program.  Despite best efforts to proofread, errors can occur which can change the meaning. Any transcriptional errors that result from this process are unintentional and may not be fully corrected at the time of dictation.     [1]  Allergies Allergen Reactions   Morphine  Nausea Only and Nausea And Vomiting   Morphine  And Codeine Nausea And Vomiting   Zoster Vaccine Live Itching and Rash  [2]  Current Meds  Medication Sig   amLODipine  (NORVASC ) 5 MG tablet Take 1 tablet (5 mg total) by mouth as needed (AS NEEDED for SBP (top blood pressure number) greater than 140).   apixaban  (ELIQUIS ) 5 MG TABS tablet TAKE 1 TABLET BY MOUTH TWICE A DAY   aspirin   81 MG tablet Take 81 mg by mouth daily.   atorvastatin  (LIPITOR) 40 MG tablet TAKE 1 TABLET BY MOUTH EVERY DAY   denosumab  (PROLIA ) 60 MG/ML SOSY injection Inject 60 mg into the skin every 6 (six) months.   ezetimibe  (ZETIA ) 10 MG tablet TAKE 1 TABLET BY MOUTH EVERY DAY   Fish Oil-Cholecalciferol (FISH OIL + D3 PO) Take 1,000 mg by mouth 1 day or 1 dose.   levalbuterol  (XOPENEX  HFA) 45 MCG/ACT inhaler Inhale 2 puffs into the lungs every 6 (six) hours as needed for wheezing or shortness of breath.   metoprolol  succinate (TOPROL -XL) 25 MG 24 hr tablet Take 1 tablet (25 mg total) by mouth daily. Take 37.5 mg twice daily.   metoprolol  tartrate (LOPRESSOR ) 25 MG tablet TAKE 1 TABLET (25 MG TOTAL) BY MOUTH 2 (TWO) TIMES DAILY AS NEEDED (AS NEEDED FOR PALPITATIONS).   pregabalin  (LYRICA ) 150 MG capsule TAKE 1 CAPSULE BY MOUTH AT BEDTIME.   Tiotropium Bromide Monohydrate  (SPIRIVA  RESPIMAT) 2.5 MCG/ACT AERS Inhale  2 puffs into the lungs daily.

## 2024-02-02 ENCOUNTER — Encounter: Payer: Self-pay | Admitting: Pulmonary Disease

## 2024-02-11 ENCOUNTER — Other Ambulatory Visit: Payer: Self-pay | Admitting: Physician Assistant

## 2024-02-11 DIAGNOSIS — I10 Essential (primary) hypertension: Secondary | ICD-10-CM

## 2024-02-11 DIAGNOSIS — I2510 Atherosclerotic heart disease of native coronary artery without angina pectoris: Secondary | ICD-10-CM

## 2024-02-27 ENCOUNTER — Telehealth: Payer: Self-pay

## 2024-02-27 NOTE — Telephone Encounter (Signed)
 Copied from CRM #[id number]. Topic: Clinical - Order For Equipment >> Feb 27, 2024  9:36 AM Rilla NOVAK wrote: Reason for CRM: Patient called DME company and they stated they needed diagnosis and also a signed prescription for the CPAP. Please contact [facility] to complete the order.  Any questions, please call patient [phone].   ----------------------------------------------------------------------- From previous Reason for Contact - Other: Reason for CRM:

## 2024-03-01 NOTE — Telephone Encounter (Signed)
 The order was sent to [facility] and they place the orders in [facility] portal. [facility] should have everything that they need to process the order. I did fax the note and sleep study to [facility] on 02/27/24

## 2024-03-01 NOTE — Telephone Encounter (Signed)
 Noted. Nothing further needed.

## 2024-03-08 ENCOUNTER — Other Ambulatory Visit: Payer: Self-pay | Admitting: Cardiovascular Disease

## 2024-03-09 ENCOUNTER — Encounter: Payer: Self-pay | Admitting: Pulmonary Disease

## 2024-04-09 ENCOUNTER — Ambulatory Visit

## 2024-05-28 ENCOUNTER — Ambulatory Visit: Admitting: Family Medicine

## 2024-07-09 ENCOUNTER — Ambulatory Visit: Admitting: Physician Assistant

## 2024-07-30 ENCOUNTER — Encounter (INDEPENDENT_AMBULATORY_CARE_PROVIDER_SITE_OTHER)

## 2024-07-30 ENCOUNTER — Ambulatory Visit (INDEPENDENT_AMBULATORY_CARE_PROVIDER_SITE_OTHER): Admitting: Nurse Practitioner
